# Patient Record
Sex: Male | Born: 1950
Health system: Southern US, Community
[De-identification: ages and names within clinical notes are randomized; demographics above are authoritative.]

## PROBLEM LIST (undated history)

## (undated) DIAGNOSIS — Z720 Tobacco use: Secondary | ICD-10-CM

## (undated) DIAGNOSIS — I2510 Atherosclerotic heart disease of native coronary artery without angina pectoris: Secondary | ICD-10-CM

## (undated) DIAGNOSIS — F419 Anxiety disorder, unspecified: Secondary | ICD-10-CM

## (undated) DIAGNOSIS — R55 Syncope and collapse: Secondary | ICD-10-CM

## (undated) DIAGNOSIS — F319 Bipolar disorder, unspecified: Secondary | ICD-10-CM

## (undated) DIAGNOSIS — J449 Chronic obstructive pulmonary disease, unspecified: Secondary | ICD-10-CM

## (undated) DIAGNOSIS — R911 Solitary pulmonary nodule: Secondary | ICD-10-CM

## (undated) DIAGNOSIS — Z87442 Personal history of urinary calculi: Secondary | ICD-10-CM

## (undated) DIAGNOSIS — K219 Gastro-esophageal reflux disease without esophagitis: Secondary | ICD-10-CM

## (undated) DIAGNOSIS — I10 Essential (primary) hypertension: Secondary | ICD-10-CM

## (undated) DIAGNOSIS — B9789 Other viral agents as the cause of diseases classified elsewhere: Secondary | ICD-10-CM

## (undated) DIAGNOSIS — J069 Acute upper respiratory infection, unspecified: Secondary | ICD-10-CM

## (undated) DIAGNOSIS — M1990 Unspecified osteoarthritis, unspecified site: Secondary | ICD-10-CM

## (undated) DIAGNOSIS — M722 Plantar fascial fibromatosis: Secondary | ICD-10-CM

## (undated) DIAGNOSIS — H409 Unspecified glaucoma: Secondary | ICD-10-CM

## (undated) DIAGNOSIS — R7303 Prediabetes: Secondary | ICD-10-CM

## (undated) DIAGNOSIS — E785 Hyperlipidemia, unspecified: Secondary | ICD-10-CM

## (undated) DIAGNOSIS — I714 Abdominal aortic aneurysm, without rupture, unspecified: Secondary | ICD-10-CM

## (undated) DIAGNOSIS — I219 Acute myocardial infarction, unspecified: Secondary | ICD-10-CM

## (undated) DIAGNOSIS — F32A Depression, unspecified: Secondary | ICD-10-CM

## (undated) DIAGNOSIS — J439 Emphysema, unspecified: Secondary | ICD-10-CM

## (undated) HISTORY — PX: COLONOSCOPY: SHX174

## (undated) HISTORY — PX: TONSILLECTOMY: SUR1361

## (undated) HISTORY — DX: Acute upper respiratory infection, unspecified: J06.9

## (undated) HISTORY — DX: Plantar fascial fibromatosis: M72.2

## (undated) HISTORY — PX: OTHER SURGICAL HISTORY: SHX169

## (undated) HISTORY — DX: Bipolar disorder, unspecified: F31.9

## (undated) HISTORY — DX: Unspecified glaucoma: H40.9

## (undated) HISTORY — DX: Gastro-esophageal reflux disease without esophagitis: K21.9

## (undated) HISTORY — DX: Essential (primary) hypertension: I10

## (undated) HISTORY — PX: PERCUTANEOUS CORONARY STENT INTERVENTION (PCI-S): SHX6016

## (undated) HISTORY — DX: Anxiety disorder, unspecified: F41.9

## (undated) HISTORY — DX: Atherosclerotic heart disease of native coronary artery without angina pectoris: I25.10

## (undated) HISTORY — DX: Unspecified osteoarthritis, unspecified site: M19.90

## (undated) HISTORY — DX: Emphysema, unspecified: J43.9

## (undated) HISTORY — PX: UPPER GASTROINTESTINAL ENDOSCOPY: SHX188

## (undated) HISTORY — DX: Other viral agents as the cause of diseases classified elsewhere: B97.89

## (undated) HISTORY — DX: Hyperlipidemia, unspecified: E78.5

## (undated) HISTORY — DX: Syncope and collapse: R55

## (undated) HISTORY — DX: Solitary pulmonary nodule: R91.1

## (undated) HISTORY — DX: Chronic obstructive pulmonary disease, unspecified: J44.9

## (undated) HISTORY — DX: Tobacco use: Z72.0

## (undated) HISTORY — DX: Prediabetes: R73.03

## (undated) HISTORY — DX: Acute myocardial infarction, unspecified: I21.9

---

## 2000-02-15 ENCOUNTER — Emergency Department (HOSPITAL_COMMUNITY): Admission: EM | Admit: 2000-02-15 | Discharge: 2000-02-16 | Payer: Self-pay | Admitting: Emergency Medicine

## 2002-12-05 ENCOUNTER — Encounter: Payer: Self-pay | Admitting: Family Medicine

## 2002-12-05 ENCOUNTER — Encounter: Admission: RE | Admit: 2002-12-05 | Discharge: 2002-12-05 | Payer: Self-pay | Admitting: Family Medicine

## 2002-12-06 ENCOUNTER — Encounter: Admission: RE | Admit: 2002-12-06 | Discharge: 2002-12-06 | Payer: Self-pay | Admitting: Family Medicine

## 2002-12-06 ENCOUNTER — Encounter: Payer: Self-pay | Admitting: Family Medicine

## 2003-06-28 ENCOUNTER — Encounter: Payer: Self-pay | Admitting: Emergency Medicine

## 2003-06-29 ENCOUNTER — Inpatient Hospital Stay (HOSPITAL_COMMUNITY): Admission: EM | Admit: 2003-06-29 | Discharge: 2003-06-30 | Payer: Self-pay | Admitting: Emergency Medicine

## 2004-08-20 ENCOUNTER — Ambulatory Visit: Payer: Self-pay | Admitting: Family Medicine

## 2004-08-25 ENCOUNTER — Ambulatory Visit: Payer: Self-pay | Admitting: Family Medicine

## 2004-10-16 ENCOUNTER — Ambulatory Visit: Payer: Self-pay | Admitting: Family Medicine

## 2005-09-08 ENCOUNTER — Ambulatory Visit: Payer: Self-pay | Admitting: Family Medicine

## 2005-09-17 ENCOUNTER — Ambulatory Visit: Payer: Self-pay | Admitting: Family Medicine

## 2009-06-24 ENCOUNTER — Inpatient Hospital Stay (HOSPITAL_COMMUNITY): Admission: EM | Admit: 2009-06-24 | Discharge: 2009-06-27 | Payer: Self-pay | Admitting: Emergency Medicine

## 2009-06-24 ENCOUNTER — Ambulatory Visit: Payer: Self-pay | Admitting: Cardiology

## 2009-06-25 ENCOUNTER — Encounter: Payer: Self-pay | Admitting: Cardiology

## 2009-07-01 ENCOUNTER — Encounter: Payer: Self-pay | Admitting: Cardiology

## 2009-07-04 DIAGNOSIS — F17200 Nicotine dependence, unspecified, uncomplicated: Secondary | ICD-10-CM | POA: Insufficient documentation

## 2009-07-04 DIAGNOSIS — R55 Syncope and collapse: Secondary | ICD-10-CM | POA: Insufficient documentation

## 2009-07-04 DIAGNOSIS — Z720 Tobacco use: Secondary | ICD-10-CM | POA: Insufficient documentation

## 2009-07-04 DIAGNOSIS — F1010 Alcohol abuse, uncomplicated: Secondary | ICD-10-CM | POA: Insufficient documentation

## 2009-07-07 ENCOUNTER — Ambulatory Visit: Payer: Self-pay | Admitting: Cardiology

## 2009-07-07 ENCOUNTER — Encounter: Payer: Self-pay | Admitting: Physician Assistant

## 2009-07-07 DIAGNOSIS — E785 Hyperlipidemia, unspecified: Secondary | ICD-10-CM | POA: Insufficient documentation

## 2009-07-07 DIAGNOSIS — I2510 Atherosclerotic heart disease of native coronary artery without angina pectoris: Secondary | ICD-10-CM | POA: Insufficient documentation

## 2009-07-07 DIAGNOSIS — F319 Bipolar disorder, unspecified: Secondary | ICD-10-CM | POA: Insufficient documentation

## 2009-07-09 ENCOUNTER — Encounter: Payer: Self-pay | Admitting: Cardiology

## 2009-07-09 ENCOUNTER — Telehealth: Payer: Self-pay | Admitting: Cardiology

## 2009-07-14 ENCOUNTER — Encounter: Payer: Self-pay | Admitting: Cardiology

## 2009-07-15 ENCOUNTER — Encounter: Payer: Self-pay | Admitting: Cardiology

## 2009-07-15 ENCOUNTER — Telehealth: Payer: Self-pay | Admitting: Cardiology

## 2009-07-24 ENCOUNTER — Telehealth: Payer: Self-pay | Admitting: Cardiology

## 2009-07-24 ENCOUNTER — Encounter: Payer: Self-pay | Admitting: Cardiology

## 2009-08-07 ENCOUNTER — Ambulatory Visit: Payer: Self-pay | Admitting: Cardiology

## 2009-08-07 DIAGNOSIS — Z9861 Coronary angioplasty status: Secondary | ICD-10-CM | POA: Insufficient documentation

## 2009-08-08 ENCOUNTER — Encounter: Payer: Self-pay | Admitting: Cardiology

## 2009-08-14 LAB — CONVERTED CEMR LAB
ALT: 28 units/L (ref 0–53)
AST: 30 units/L (ref 0–37)
Albumin: 4 g/dL (ref 3.5–5.2)
Alkaline Phosphatase: 102 units/L (ref 39–117)
Bilirubin, Direct: 0 mg/dL (ref 0.0–0.3)
Cholesterol: 193 mg/dL (ref 0–200)
HDL: 55 mg/dL (ref >39.00)
LDL Cholesterol: 119 mg/dL — ABNORMAL HIGH (ref 0–99)
Total Bilirubin: 0.7 mg/dL (ref 0.3–1.2)
Total CHOL/HDL Ratio: 4
Total Protein: 6.5 g/dL (ref 6.0–8.3)
Triglycerides: 94 mg/dL (ref 0.0–149.0)
VLDL: 18.8 mg/dL (ref 0.0–40.0)

## 2009-09-08 ENCOUNTER — Ambulatory Visit: Payer: Self-pay | Admitting: Cardiology

## 2009-09-09 ENCOUNTER — Ambulatory Visit: Payer: Self-pay | Admitting: Cardiology

## 2009-09-09 LAB — CONVERTED CEMR LAB
ALT: 35 units/L (ref 0–53)
AST: 28 units/L (ref 0–37)
Albumin: 3.8 g/dL (ref 3.5–5.2)
Cholesterol: 148 mg/dL (ref 0–200)
HDL: 63 mg/dL (ref >39.00)
Total Protein: 6.3 g/dL (ref 6.0–8.3)
Triglycerides: 104 mg/dL (ref 0.0–149.0)
VLDL: 20.8 mg/dL (ref 0.0–40.0)

## 2009-09-23 ENCOUNTER — Telehealth (INDEPENDENT_AMBULATORY_CARE_PROVIDER_SITE_OTHER): Payer: Self-pay | Admitting: *Deleted

## 2009-10-23 ENCOUNTER — Ambulatory Visit: Payer: Self-pay | Admitting: Cardiology

## 2009-11-20 DIAGNOSIS — I219 Acute myocardial infarction, unspecified: Secondary | ICD-10-CM | POA: Insufficient documentation

## 2009-11-26 ENCOUNTER — Ambulatory Visit: Payer: Self-pay | Admitting: Cardiology

## 2009-12-24 ENCOUNTER — Telehealth: Payer: Self-pay | Admitting: Cardiology

## 2010-01-02 ENCOUNTER — Telehealth (INDEPENDENT_AMBULATORY_CARE_PROVIDER_SITE_OTHER): Payer: Self-pay | Admitting: *Deleted

## 2010-01-30 ENCOUNTER — Ambulatory Visit: Payer: Self-pay | Admitting: Cardiology

## 2010-02-10 ENCOUNTER — Ambulatory Visit (HOSPITAL_COMMUNITY): Admission: RE | Admit: 2010-02-10 | Discharge: 2010-02-10 | Payer: Self-pay | Admitting: Cardiology

## 2010-02-10 ENCOUNTER — Ambulatory Visit: Payer: Self-pay | Admitting: Cardiology

## 2010-04-07 ENCOUNTER — Ambulatory Visit: Payer: Self-pay | Admitting: Cardiology

## 2010-04-22 ENCOUNTER — Telehealth (INDEPENDENT_AMBULATORY_CARE_PROVIDER_SITE_OTHER): Payer: Self-pay | Admitting: *Deleted

## 2010-06-08 ENCOUNTER — Telehealth: Payer: Self-pay | Admitting: Cardiology

## 2010-07-28 ENCOUNTER — Ambulatory Visit: Payer: Self-pay | Admitting: Cardiology

## 2010-07-28 DIAGNOSIS — J984 Other disorders of lung: Secondary | ICD-10-CM | POA: Insufficient documentation

## 2010-07-28 DIAGNOSIS — Z9189 Other specified personal risk factors, not elsewhere classified: Secondary | ICD-10-CM | POA: Insufficient documentation

## 2010-07-30 ENCOUNTER — Encounter: Payer: Self-pay | Admitting: Cardiology

## 2010-07-31 ENCOUNTER — Ambulatory Visit: Payer: Self-pay | Admitting: Family Medicine

## 2010-07-31 DIAGNOSIS — L309 Dermatitis, unspecified: Secondary | ICD-10-CM | POA: Insufficient documentation

## 2010-07-31 DIAGNOSIS — D239 Other benign neoplasm of skin, unspecified: Secondary | ICD-10-CM | POA: Insufficient documentation

## 2010-08-03 ENCOUNTER — Telehealth: Payer: Self-pay | Admitting: Family Medicine

## 2010-08-10 ENCOUNTER — Telehealth: Payer: Self-pay | Admitting: Cardiology

## 2010-08-17 ENCOUNTER — Ambulatory Visit: Payer: Self-pay | Admitting: Cardiology

## 2010-08-19 LAB — CONVERTED CEMR LAB
ALT: 39 units/L (ref 0–53)
Albumin: 3.8 g/dL (ref 3.5–5.2)
Alkaline Phosphatase: 88 units/L (ref 39–117)
Bilirubin, Direct: 0.2 mg/dL (ref 0.0–0.3)
Total Protein: 6.4 g/dL (ref 6.0–8.3)

## 2010-11-15 LAB — CONVERTED CEMR LAB
Alkaline Phosphatase: 96 units/L (ref 39–117)
Bilirubin, Direct: 0.2 mg/dL (ref 0.0–0.3)
Cholesterol: 157 mg/dL (ref 0–200)
LDL Cholesterol: 69 mg/dL (ref 0–99)
Total Bilirubin: 1 mg/dL (ref 0.3–1.2)
Total Protein: 6.9 g/dL (ref 6.0–8.3)
VLDL: 17.6 mg/dL (ref 0.0–40.0)

## 2010-11-19 NOTE — Progress Notes (Signed)
Summary: pt needs refill called into [facility]  Phone Note Refill Request Call back at [facility] Phone [phone] Message from:  Patient on [facility]  Refills Requested: Medication #1:  PLAVIX 75 MG TABS Take one tablet by mouth daily Initial call taken by: Omer Jack,  April 22, 2010 11:28 AM  Follow-up for Phone Call        medication wiil arrive between the next 7-10 business days. patient notified Follow-up by: Vikki Ports,  April 22, 2010 11:54 AM     Appended Document: pt needs refill called into [facility] Plavix arrived in the office.  I left a message for the pt to come by and pick-up medication.  Order #[id number], Customer [phone], Customer PO  [phone]

## 2010-11-19 NOTE — Assessment & Plan Note (Signed)
Summary: PSORIASIS AND SPOT ON FACE//SLM   Vital Signs:  Patient profile:   60 year old male Weight:      153 pounds Temp:     98.5 degrees F oral BP sitting:   174 / 86  Vitals Entered By: Lynann Beaver CMA (July 31, 2010 9:59 AM) CC: lesions of face and lgs Is Patient Diabetic? No Pain Assessment Patient in pain? no        Primary Care Provider:  Governor Specking  CC:  lesions of face and lgs.  History of Present Illness: Jeffrey Hudson is a 60 year old male, smoker of history of bipolar disease, alcohol abuse, coronary disease, who comes in today for evaluation of a facial lesion, and eczema.  He wants a flu shot and to discuss quit smoking.  He said a lesion in left side of his face at the edge in the nose for over 12 months.  It's increasing in size.  He is eczema on his right lower extremity and has been using his wife's medication.  He would like a flu shot today.  He reeks of tobacco.  I outlined a smoking cessation program, which include chantix, but we will drastically cut back the dose to a half a milligram daily because of his underlying mental health problems.  Current Medications (verified): 1)  Plavix 75 Mg Tabs (Clopidogrel Bisulfate) .... Take One Tablet By Mouth Daily 2)  Aspirin 81 Mg Tbec (Aspirin) .... Take One Tablet By Mouth Daily 3)  Prozac 20 Mg Caps (Fluoxetine Hcl) .... Take 1 Capsule By Mouth Once A Day 4)  Zyprexa 5 Mg Tabs (Olanzapine) .... Take 1 Tablet By Mouth Once A Day 5)  Multivitamins  Tabs (Multiple Vitamin) .... Take 1 Tablet By Mouth Once A Day 6)  Crestor 40 Mg Tabs (Rosuvastatin Calcium) .... Take One Tablet By Mouth Daily. 7)  Nitrostat 0.4 Mg Subl (Nitroglycerin) .Marland Kitchen.. 1 Tablet Under Tongue At Onset of Chest Pain; You May Repeat Every 5 Minutes For Up To 3 Doses.  Allergies (verified): No Known Drug Allergies  Past History:  Past medical, surgical, family and social histories (including risk factors) reviewed for relevance to current  acute and chronic problems.  Past Medical History: Reviewed history from 11/20/2009 and no changes required.  A 6-mm smoothly rounded nodule in the left lower lobe, seen on CT  of the chest in 2004, likely benign, but followup CT recommended,  not performed.   PCI of diagonal with non DES stent.  Current Problems:  CAD, NATIVE VESSEL (ICD-414.01) AMI (ICD-410.90)-Acute ST. elevation  HYPERLIPIDEMIA-MIXED (ICD-272.4) SYNCOPE (ICD-780.2)- syncope in 2001 and 2004 with no clear cause.  TOBACCO ABUSE (ICD-305.1) BIPOLAR DISORDER UNSPECIFIED (ICD-296.80)     Past Surgical History: Reviewed history from 07/04/2009 and no changes required.  None.   Family History: Reviewed history from 07/04/2009 and no changes required.  His mother died in her mid 46s with no history of heart   disease and his father died in his 71s with a history of EtOH abuse, but   no coronary artery disease and no siblings have coronary artery disease.      Social History: Reviewed history from 07/07/2009 and no changes required. He lives in [place] with his wife.  He is currently   unemployed, in [employer].  He admits to heavy tobacco use with greater than   one pack per day and heavy EtOH use with 8-10 beers daily.  He smokes marijuana but says he quit 06/2009.  He does  have a history of THC use.      Review of Systems      See HPI  Physical Exam  General:  Well-developed,well-nourished,in no acute distress; alert,appropriate and cooperative throughout examination Skin:  there is a 8mm irregular lesion in the left side of the face, just at the margin.  The nose with a pit in the middle consistent a possible skin cancer.  He also has eczema on his right lower extremity.   Problems:  Medical Problems Added: 1)  Dx of Nevus, Atypical  (ICD-216.9) 2)  Dx of Eczema, Atopic  (ICD-691.8)  Impression & Recommendations:  Problem # 1:  ECZEMA, ATOPIC (ICD-691.8) Assessment Unchanged  His updated medication  list for this problem includes:    Triamcinolone Acetonide 0.025 % Oint (Triamcinolone acetonide) .Marland Kitchen... Apply  two times a day  Problem # 2:  NEVUS, ATYPICAL (ICD-216.9) Assessment: New  Problem # 3:  TOBACCO ABUSE (ICD-305.1) Assessment: Unchanged  His updated medication list for this problem includes:    Chantix Continuing Month Pak 1 Mg Tabs (Varenicline tartrate) ..... Uad  Orders: Tobacco use cessation intermediate 3-10 minutes (99406)  Complete Medication List: 1)  Plavix 75 Mg Tabs (Clopidogrel bisulfate) .... Take one tablet by mouth daily 2)  Aspirin 81 Mg Tbec (Aspirin) .... Take one tablet by mouth daily 3)  Prozac 20 Mg Caps (Fluoxetine hcl) .... Take 1 capsule by mouth once a day 4)  Zyprexa 5 Mg Tabs (Olanzapine) .... Take 1 tablet by mouth once a day 5)  Multivitamins Tabs (Multiple vitamin) .... Take 1 tablet by mouth once a day 6)  Crestor 40 Mg Tabs (Rosuvastatin calcium) .... Take one tablet by mouth daily. 7)  Nitrostat 0.4 Mg Subl (Nitroglycerin) .Marland Kitchen.. 1 tablet under tongue at onset of chest pain; you may repeat every 5 minutes for up to 3 doses. 8)  Chantix Continuing Month Pak 1 Mg Tabs (Varenicline tartrate) .... Uad 9)  Triamcinolone Acetonide 0.025 % Oint (Triamcinolone acetonide) .... Apply  two times a day  Patient Instructions: 1)  called [facility]  dermatology to make an appointment to be seen about the mole.  2)  Begin chantix one half tablet every morning.  Return in 4 weeks for follow-up. 3)  Use a combination of udder cream and triamcinolone on the eczema Prescriptions: TRIAMCINOLONE ACETONIDE 0.025 % OINT (TRIAMCINOLONE ACETONIDE) apply  two times a day  #60 gr x 3   Entered and Authorized by:   Roderick Pee MD   Signed by:   Roderick Pee MD on 07/31/2010   Method used:   Print then Give to Patient   RxID:   [id number] CHANTIX CONTINUING MONTH PAK 1 MG TABS (VARENICLINE TARTRATE) UAD  #1 x 2   Entered and Authorized by:   Roderick Pee MD   Signed by:   Roderick Pee MD on 07/31/2010   Method used:   Print then Give to Patient   RxID:   [id number] TRIAMCINOLONE ACETONIDE 0.025 % OINT (TRIAMCINOLONE ACETONIDE) apply  two times a day  #60 gr x 3   Entered and Authorized by:   Roderick Pee MD   Signed by:   Roderick Pee MD on 07/31/2010   Method used:   Electronically to        [facility] [phone][facility] (retail)       [address]       [place], [place]  [address]  Ph: [phone] or [phone]       Fax: [phone]   RxID:   [id number] CHANTIX CONTINUING MONTH PAK 1 MG TABS (VARENICLINE TARTRATE) UAD  #1 x 2   Entered and Authorized by:   Roderick Pee MD   Signed by:   Roderick Pee MD on 07/31/2010   Method used:   Electronically to        [facility] [phone]* (retail)       [address]       [place], [place]  [address]       Ph: [phone] or [phone]       Fax: [phone]   RxID:   [id number]

## 2010-11-19 NOTE — Progress Notes (Signed)
Summary: DENTAL SURGERY   Phone Note Call from Patient Call back at Home Phone [phone]   Caller: Patient Summary of Call: PT HAVING DENTAL SURGERY WANT TO KNOW IF HE NEED TO STOP ANY OF HIS MEDICATIONS Initial call taken by: Judie Grieve,  December 24, 2009 11:22 AM  Follow-up for Phone Call        I spoke with the pt and he has not seen an oral surgeon at this time.  The pt said he needs to have a tooth removed so that he can get dentures. I asked the pt to see the oral surgeon and then they would advise him if he needs to hold Plavix and ASA for extraction.  I also instructed the pt that he will need to call our office to discuss clearance for holding these medications if needed.  Pt agrees to call us after he sees the oral surgeon if he needs to hold these meds. Follow-up by: Julieta Gutting, RN, BSN,  December 24, 2009 12:26 PM

## 2010-11-19 NOTE — Assessment & Plan Note (Signed)
Summary: f24m   Visit Type:  4 months follow up Primary Provider:  Governor Specking  CC:  No cardiac complaints.  History of Present Illness: Patient is ok.  He does not any specific issues. He has used no NTG recently.  Patient is concerned about the fact that he is unemployed and uninsured.  He is walking without discomfort.  Still having trouble with smoking, but he is about 50% of what he was.  He says he has too much time on his hands.  He knows he needs to quite.    Current Medications (verified): 1)  Plavix 75 Mg Tabs (Clopidogrel Bisulfate) .... Take One Tablet By Mouth Daily 2)  Aspirin 81 Mg Tbec (Aspirin) .... Take One Tablet By Mouth Daily 3)  Prozac 20 Mg Caps (Fluoxetine Hcl) .... Take 1 Capsule By Mouth Once A Day 4)  Zyprexa 5 Mg Tabs (Olanzapine) .... Take 1 Tablet By Mouth Once A Day 5)  Multivitamins  Tabs (Multiple Vitamin) .... Take 1 Tablet By Mouth Once A Day 6)  Crestor 40 Mg Tabs (Rosuvastatin Calcium) .... Take One Tablet By Mouth Daily. 7)  Nitrostat 0.4 Mg Subl (Nitroglycerin) .Marland Kitchen.. 1 Tablet Under Tongue At Onset of Chest Pain; You May Repeat Every 5 Minutes For Up To 3 Doses.  Allergies (verified): No Known Drug Allergies  Past History:  Past Medical History: Last updated: 11/20/2009  A 6-mm smoothly rounded nodule in the left lower lobe, seen on CT  of the chest in 2004, likely benign, but followup CT recommended,  not performed.   PCI of diagonal with non DES stent.  Current Problems:  CAD, NATIVE VESSEL (ICD-414.01) AMI (ICD-410.90)-Acute ST. elevation  HYPERLIPIDEMIA-MIXED (ICD-272.4) SYNCOPE (ICD-780.2)- syncope in 2001 and 2004 with no clear cause.  TOBACCO ABUSE (ICD-305.1) BIPOLAR DISORDER UNSPECIFIED (ICD-296.80)     Past Surgical History: Last updated: 07-09-2009  None.   Family History: Last updated: July 09, 2009  His mother died in her mid 53s with no history of heart   disease and his father died in his 8s with a history of EtOH abuse,  but   no coronary artery disease and no siblings have coronary artery disease.      Social History: Last updated: 07/07/2009 He lives in [place] with his wife.  He is currently   unemployed, in [employer].  He admits to heavy tobacco use with greater than   one pack per day and heavy EtOH use with 8-10 beers daily.  He smokes marijuana but says he quit 06/2009.  He does have a history of THC use.      Vital Signs:  Patient profile:   60 year old male Height:      66 inches Weight:      152.50 pounds BMI:     24.70 Pulse rate:   71 / minute Pulse rhythm:   regular Resp:     18 per minute BP sitting:   130 / 72  (left arm) Cuff size:   large  Vitals Entered By: Vikki Ports (July 28, 2010 10:17 AM)  Physical Exam  General:  Well developed, well nourished, in no acute distress. Head:  normocephalic and atraumatic Eyes:  PERRLA/EOM intact; conjunctiva and lids normal. Lungs:  Clear bilaterally to auscultation and percussion. Extremities:  No clubbing or cyanosis. Neurologic:  Alert and oriented x 3.   CXR  Procedure date:  06/25/2009  Findings:      CHEST - 2 VIEW    Comparison: None  Findings: The heart size and mediastinal contours are within normal   limits.  Both lungs are clear.  The visualized skeletal structures   are unremarkable.    IMPRESSION:   No acute findings.    Read By:  Rosealee Albee,  M.D.   Released By:  Rosealee Albee,  M.D.  EKG  Procedure date:  07/28/2010  Findings:      NSR.  LAD      Impression & Recommendations:  Problem # 1:  CAD, NATIVE VESSEL (ICD-414.01) Patient has no current symptoms.  He is doing relatively well at present.  We will continue to followup with patient at six months. His updated medication list for this problem includes:    Plavix 75 Mg Tabs (Clopidogrel bisulfate) .Marland Kitchen... Take one tablet by mouth daily    Aspirin 81 Mg Tbec (Aspirin) .Marland Kitchen... Take one tablet by mouth daily    Nitrostat 0.4 Mg Subl  (Nitroglycerin) .Marland Kitchen... 1 tablet under tongue at onset of chest pain; you may repeat every 5 minutes for up to 3 doses.  His updated medication list for this problem includes:    Plavix 75 Mg Tabs (Clopidogrel bisulfate) .Marland Kitchen... Take one tablet by mouth daily    Aspirin 81 Mg Tbec (Aspirin) .Marland Kitchen... Take one tablet by mouth daily    Nitrostat 0.4 Mg Subl (Nitroglycerin) .Marland Kitchen... 1 tablet under tongue at onset of chest pain; you may repeat every 5 minutes for up to 3 doses.  Orders: EKG w/ Interpretation (93000) TLB-Lipid Panel (80061-LIPID) TLB-Hepatic/Liver Function Pnl (80076-HEPATIC)  Problem # 2:  HYPERLIPIDEMIA-MIXED (ICD-272.4)  Has not had lab in some time.  His updated medication list for this problem includes:    Crestor 40 Mg Tabs (Rosuvastatin calcium) .Marland Kitchen... Take one tablet by mouth daily.  Orders: EKG w/ Interpretation (93000) TLB-Lipid Panel (80061-LIPID) TLB-Hepatic/Liver Function Pnl (80076-HEPATIC)  His updated medication list for this problem includes:    Crestor 40 Mg Tabs (Rosuvastatin calcium) .Marland Kitchen... Take one tablet by mouth daily.  Problem # 3:  TOBACCO ABUSE (ICD-305.1) Continues to smoke. Counseling given.   Problem # 4:  LUNG NODULE (ICD-518.89) See note of CT of 2004 done by Dr. Tawanna Cooler.  .  Had follow up CXR 2010.  No obvious nodule was seen.  Patient Instructions: 1)  Your physician recommends that you have a FASTING Lipid and Liver Profile today. 2)  Your physician recommends that you continue on your current medications as directed. Please refer to the Current Medication list given to you today. 3)  Your physician wants you to follow-up in:  6 MONTHS.  You will receive a reminder letter in the mail two months in advance. If you don't receive a letter, please call our office to schedule the follow-up appointment. 4)  Plavix assistance form given to the pt for completion.

## 2010-11-19 NOTE — Miscellaneous (Signed)
Summary: [facility] Progress Note  [facility] Progress Note   Imported By: Roderic Ovens 01/21/2010 14:35:31  _____________________________________________________________________  External Attachment:    Type:   Image     Comment:   External Document

## 2010-11-19 NOTE — Progress Notes (Signed)
Summary: Plavix assistance  Phone Note Outgoing Call   Call placed by: Julieta Gutting, RN, BSN,  August 10, 2010 3:09 PM Call placed to: Patient Summary of Call: Left message for pt that Plavix has arrived.  Plavix placed at the front desk for pick-up.  Customer PO  number [id number], Order #[id number] Initial call taken by: Julieta Gutting, RN, BSN,  August 10, 2010 3:09 PM

## 2010-11-19 NOTE — Progress Notes (Signed)
Summary: need refill crestor- pt assitance   Phone Note Call from Patient Call back at Home Phone [phone] Message from:  Patient on June 08, 2010 9:29 AM  Refills Requested: Medication #1:  CRESTOR 40 MG TABS Take one tablet by mouth daily. Caller: Patient Reason for Call: Talk to Nurse Summary of Call: Per pt calling on patient assistance with drug company. pt also dr. Tedra Sen[place] need license # .  Initial call taken by: Lorne Skeens,  June 08, 2010 9:29 AM  Follow-up for Phone Call        I spoke with the pt and he needs a new Rx to send into [facility] for Crestor.  The other information needed was provided to the pt.  Follow-up by: Julieta Gutting, RN, BSN,  June 08, 2010 12:44 PM    Prescriptions: CRESTOR 40 MG TABS (ROSUVASTATIN CALCIUM) Take one tablet by mouth daily.  #90 x 3   Entered by:   Julieta Gutting, RN, BSN   Authorized by:   Ronaldo Miyamoto, MD, [facility]   Signed by:   Julieta Gutting, RN, BSN on 06/08/2010   Method used:   Print then Mail to Patient   RxID:   [id number]

## 2010-11-19 NOTE — Progress Notes (Signed)
Summary: Pt need his Plavix ordered  Phone Note Call from Patient Call back at Home Phone [phone]   Caller: Patient Summary of Call: Pt calling to get his Plavix ordered, said the office orders this medication. Initial call taken by: Judie Grieve,  January 02, 2010 9:55 AM  Follow-up for Phone Call        Medication will arrive at our office between the next 7 to 10 days.  Follow-up by: Vikki Ports,  January 02, 2010 2:16 PM     Appended Document: Pt need his Plavix ordered Plavix arrived in the office and placed at the front desk for pick-up.  order # [id number], Customer PO # [id number].  Left message on the pt's phone in regards to plavix.

## 2010-11-19 NOTE — Progress Notes (Signed)
Summary: rx change  Phone Note From Pharmacy Call back at [phone]   Caller: [facility]    voice mail Summary of Call: received rx for triamcinolone 0.025% ointment. pt has requested the cream instead. it only costs $4.00. Initial call taken by: Warnell Forester,  August 03, 2010 10:07 AM    New/Updated Medications: TRIAMCINOLONE ACETONIDE 0.025 % CREA (TRIAMCINOLONE ACETONIDE) apply two times a day Prescriptions: TRIAMCINOLONE ACETONIDE 0.025 % CREA (TRIAMCINOLONE ACETONIDE) apply two times a day  #2 x 6   Entered by:   Kern Reap CMA (AAMA)   Authorized by:   Roderick Pee MD   Signed by:   Kern Reap CMA (AAMA) on 08/03/2010   Method used:   [facility]        [facility] [phone][facility] (retail)       [address]       [place], [place]  [address]       Ph: [phone] or [phone]       Fax: [phone]   RxID:   [id number]

## 2010-11-19 NOTE — Assessment & Plan Note (Signed)
Summary: eph   Visit Type:  2  mo f/u Primary Provider:  Governor Specking  CC:  pt states he has had some very very slight cp states he feels good though.....  History of Present Illness: Doing well at this point.  Has had some very slight discomfort, so feeling good as a whole.  Down to a half a pack per day.  He understands that he needs to quit.  Trying all he can.  Cognitive of everything he is doing.    Current Medications (verified): 1)  Plavix 75 Mg Tabs (Clopidogrel Bisulfate) .... Take One Tablet By Mouth Daily 2)  Aspirin 81 Mg Tbec (Aspirin) .... Take One Tablet By Mouth Daily 3)  Prozac 20 Mg Caps (Fluoxetine Hcl) .... Take 1 Capsule By Mouth Once A Day 4)  Zyprexa 5 Mg Tabs (Olanzapine) .... Take 1 Tablet By Mouth Once A Day 5)  Multivitamins  Tabs (Multiple Vitamin) .... Take 1 Tablet By Mouth Once A Day 6)  Crestor 40 Mg Tabs (Rosuvastatin Calcium) .... Take One Tablet By Mouth Daily. 7)  Nitrostat 0.4 Mg Subl (Nitroglycerin) .Marland Kitchen.. 1 Tablet Under Tongue At Onset of Chest Pain; You May Repeat Every 5 Minutes For Up To 3 Doses.  Allergies (verified): No Known Drug Allergies  Vital Signs:  Patient profile:   60 year old male Height:      66 inches Weight:      148 pounds BMI:     23.97 Pulse rate:   62 / minute Pulse rhythm:   regular BP sitting:   134 / 80  (left arm) Cuff size:   large  Vitals Entered By: Danielle Rankin, CMA (April 07, 2010 10:41 AM)  Physical Exam  General:  Well developed, well nourished, well hydrated, in no acute distress.  Head:  Normocephalic and atraumatic.  Eyes:  Conjunctiva and sclera clear without injection or icterus.  Lungs:  Clear to auscultation bilaterally. Normal respiratory effort.  Heart:  PMI non displaced.  Normal S1 and S2.  S4 gallop.  No murmur. Abdomen:  Soft, non-tender, non-distended, free of hepatosplenomegaly or masses.  Pulses:  Pulses normal in all 4 extremities.  Extremities:  No clubbing, cyanosis, or edema.    Neurologic:  Alert and oriented x 3. Normal gait without ataxia.    Cardiac Cath  Procedure date:  02/11/2010  Findings:      CONCLUSION:   1. Preserved left ventricular function.   2. Scattered disease as noted with mild restenosis of a very small-       caliber stent in the diagonal at a bifurcation point.  There was       excellent TIMI 3 flow in this area.  We would strongly recommend       discontinuation of       smoking for the patient.  I am not inclined to dilate the stent       again as it is again noted to be very small in caliber.  The       healing is appropriate, and the side branch comes out in the       midportion of the stent.  Continued medical therapy is warranted.      Impression & Recommendations:  Problem # 1:  CAD, NATIVE VESSEL (ICD-414.01) Long discussion again today. Encourage dc of smoking. Continued medical therapy.  Mechanism of ACS reviewed.  His updated medication list for this problem includes:    Plavix 75 Mg Tabs (Clopidogrel bisulfate) .Marland KitchenMarland KitchenMarland KitchenMarland Kitchen  Take one tablet by mouth daily    Aspirin 81 Mg Tbec (Aspirin) .Marland Kitchen... Take one tablet by mouth daily    Nitrostat 0.4 Mg Subl (Nitroglycerin) .Marland Kitchen... 1 tablet under tongue at onset of chest pain; you may repeat every 5 minutes for up to 3 doses.  Problem # 2:  HYPERLIPIDEMIA-MIXED (ICD-272.4) on treatment.  At [facility] in November.  His updated medication list for this problem includes:    Crestor 40 Mg Tabs (Rosuvastatin calcium) .Marland Kitchen... Take one tablet by mouth daily.  Patient Instructions: 1)  Your physician recommends that you continue on your current medications as directed. Please refer to the Current Medication list given to you today. 2)  Your physician wants you to follow-up in:   4 MONTHS. You will receive a reminder letter in the mail two months in advance. If you don't receive a letter, please call our office to schedule the follow-up appointment. Prescriptions: NITROSTAT 0.4 MG SUBL (NITROGLYCERIN)  1 tablet under tongue at onset of chest pain; you may repeat every 5 minutes for up to 3 doses.  #25 x 9   Entered by:   Danielle Rankin, CMA   Authorized by:   Ronaldo Miyamoto, MD, [facility]   Signed by:   Danielle Rankin, CMA on 04/07/2010   Method used:   Electronically to        [facility] [phone]* (retail)       [address]       [place], [place]  [address]       Ph: [phone] or [phone]       Fax: [phone]   RxID:   [id number]

## 2010-11-19 NOTE — Assessment & Plan Note (Signed)
Summary: ROV   Visit Type:  follow up Primary Provider:  Governor Specking  CC:  Pt. still having chest pains.  History of Present Illness: Still having some discomfort, a sharp pain, and sometimes pressure.  It is not definitely worse when he exerts himself.  Occurs at rest, and can last indefinitely.  It is not preventing sleeping, and he would describe as a 1 or 2.  This has been going on for quite some time.  However, it is pretty much ongoing.  It has not gotten better, nor has it gotten worse.  It is not quite similar to his MI.  The character of the pain is mild in comparison, but the pain is similar.  Patient is still smoking.    Current Medications (verified): 1)  Plavix 75 Mg Tabs (Clopidogrel Bisulfate) .... Take One Tablet By Mouth Daily 2)  Aspirin Ec 325 Mg Tbec (Aspirin) .... Take One Tablet By Mouth Daily 3)  Prozac 20 Mg Caps (Fluoxetine Hcl) .... Take 1 Capsule By Mouth Once A Day 4)  Zyprexa 5 Mg Tabs (Olanzapine) .... Take 1 Tablet By Mouth Once A Day 5)  Multivitamins  Tabs (Multiple Vitamin) .... Take 1 Tablet By Mouth Once A Day 6)  Crestor 40 Mg Tabs (Rosuvastatin Calcium) .... Take One Tablet By Mouth Daily.  Allergies (verified): No Known Drug Allergies  Past History:  Past Medical History: Last updated: 11/20/2009  A 6-mm smoothly rounded nodule in the left lower lobe, seen on CT  of the chest in 2004, likely benign, but followup CT recommended,  not performed.   PCI of diagonal with non DES stent.  Current Problems:  CAD, NATIVE VESSEL (ICD-414.01) AMI (ICD-410.90)-Acute ST. elevation  HYPERLIPIDEMIA-MIXED (ICD-272.4) SYNCOPE (ICD-780.2)- syncope in 2001 and 2004 with no clear cause.  TOBACCO ABUSE (ICD-305.1) BIPOLAR DISORDER UNSPECIFIED (ICD-296.80)     Past Surgical History: Last updated: 07-25-09  None.   Family History: Last updated: 25-Jul-2009  His mother died in her mid 70s with no history of heart   disease and his father died in his 3s  with a history of EtOH abuse, but   no coronary artery disease and no siblings have coronary artery disease.      Social History: Last updated: 07/07/2009 He lives in [place] with his wife.  He is currently   unemployed, in [employer].  He admits to heavy tobacco use with greater than   one pack per day and heavy EtOH use with 8-10 beers daily.  He smokes marijuana but says he quit 06/2009.  He does have a history of THC use.      Vital Signs:  Patient profile:   60 year old male Height:      66 inches Weight:      150.25 pounds BMI:     24.34 Pulse rate:   61 / minute Pulse rhythm:   regular Resp:     18 per minute BP sitting:   150 / 78  (left arm) Cuff size:   large  Vitals Entered By: Vikki Ports (January 30, 2010 9:21 AM)  Physical Exam  General:  Well developed, well nourished, in no acute distress. Head:  normocephalic and atraumatic Neck:  Neck supple, no JVD. No masses, thyromegaly or abnormal cervical nodes. Lungs:  Clear bilaterally to auscultation and percussion. Heart:  PMI non displaced.  Normal S1 and S2.  S4. Abdomen:  Bowel sounds positive; abdomen soft and non-tender without masses, organomegaly, or hernias noted. No hepatosplenomegaly.  Msk:  Back normal, normal gait. Muscle strength and tone normal. Pulses:  pulses normal in all 4 extremities Extremities:  No clubbing or cyanosis. Neurologic:  Alert and oriented x 3.   Cardiac Cath  Procedure date:  06/24/2009  Findings:      ANGIOGRAPHIC DATA: 1. The right coronary artery is moderately large vessel.  There is     mild diffuse luminal irregularity.  There is about a 20% area of     narrowing midvessel and 30% narrowing after the first inferior     branch.  The posterolateral branch has bifurcation disease with 60%     in the distal most branch and 50% in the more medial branch. 2. The left main is free of critical disease. 3. The LAD courses to the apex.  The LAD has about 30% narrowing in     the  midvessel overlying the septal perforator.  The septal     perforator itself has about 70% narrowing.  The distal vessel has     30% and 40% narrowings.  The large first diagonal which comes off     proximal to the septal, was totally occluded proximally.  After     reperfusion, there was diffuse segmental plaque which was     subsequently stented with excellent result with resolution of     stenosis to less than 10% TIMI-3 flow.  Distal to this, there was     another 50% area of narrowing and some diffuse luminal irregularity     of the large diagonal branch.  The small side branch does fill-in     by collaterals from the distal branch. 4. The circumflex is a moderate-sized vessel with several scattered     areas of luminal irregularity of about 40%, but no critical     stenoses. 5. Ventriculography in RAO projection reveals anterolateral     hypokinesis, but overall ejection fraction in the range of 45-50%.   CONCLUSIONS: 1. Acute myocardial infarction due to diagonal occlusion. 2. Successful reperfusion using a small non-drug-eluting stent due to     vessel size, and also other issues.  Cardiac Cath  Procedure date:  01/30/2010  Findings:      NSR.  WNL.    EKG  Procedure date:  01/30/2010  Findings:      NSR. WNL.  Impression & Recommendations:  Problem # 1:  CAD, NATIVE VESSEL (ICD-414.01)  Some atypical features.  They are not clearly ischemic, and somewhat atypical.  Not sure of the best approach, but given the findings would favor restudy to clarify where we are.  Would prefer this week, but patient needs to leave town.  Have made recommendations of location for him about where he is.  Told to seek attentiont if more symptoms. His updated medication list for this problem includes:    Plavix 75 Mg Tabs (Clopidogrel bisulfate) .Marland Kitchen... Take one tablet by mouth daily    Aspirin Ec 325 Mg Tbec (Aspirin) .Marland Kitchen... Take one tablet by mouth daily  Orders: EKG w/ Interpretation  (93000) Cardiac Catheterization (Cardiac Cath)  Problem # 2:  HYPERLIPIDEMIA-MIXED (ICD-272.4) Lipid profile at target.  No symptoms.    His updated medication list for this problem includes:    Crestor 40 Mg Tabs (Rosuvastatin calcium) .Marland Kitchen... Take one tablet by mouth daily.  Problem # 3:  TOBACCO ABUSE (ICD-305.1)  cannot seem to quit.    Orders: EKG w/ Interpretation (93000)  Patient Instructions: 1)  Your physician has requested  that you have a cardiac catheterization.  Cardiac catheterization is used to diagnose and/or treat various heart conditions. Doctors may recommend this procedure for a number of different reasons. The most common reason is to evaluate chest pain. Chest pain can be a symptom of coronary artery disease (CAD), and cardiac catheterization can show whether plaque is narrowing or blocking your heart's arteries. This procedure is also used to evaluate the valves, as well as measure the blood flow and oxygen levels in different parts of your heart.  For further information please visit [email or web address]  Please follow instruction sheet, as given. 2)  Your physician recommends that you schedule a follow-up appointment in: 2 MONTHS

## 2010-11-19 NOTE — Letter (Signed)
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

## 2010-11-19 NOTE — Assessment & Plan Note (Signed)
Summary: PER CHECK OUT/SF   Visit Type:  1 mo f/u Primary Provider:  Governor Specking  CC:  chest pain...denies any sob or edema.  History of Present Illness: Patient is not doing well with smoking.  Did try target date, but it has been difficult.  HIs lifestyle has been impacted, essentially with idle time.  He has started to have some chest discomfort.  It was stabbing, infrequent, and has no exertional discomfort.  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  It has  been too cold for the patient, as he has joint issues as well.   Current Medications (verified): 1)  Plavix 75 Mg Tabs (Clopidogrel Bisulfate) .... Take One Tablet By Mouth Daily 2)  Aspirin Ec 325 Mg Tbec (Aspirin) .... Take One Tablet By Mouth Daily 3)  Prozac 20 Mg Caps (Fluoxetine Hcl) .... Take 1 Capsule By Mouth Once A Day 4)  Zyprexa 5 Mg Tabs (Olanzapine) .... Take 1 Tablet By Mouth Once A Day 5)  Multivitamins  Tabs (Multiple Vitamin) .... Take 1 Tablet By Mouth Once A Day 6)  Crestor 40 Mg Tabs (Rosuvastatin Calcium) .... Take One Tablet By Mouth Daily.  Allergies (verified): No Known Drug Allergies  Past History:  Past Medical History: Last updated: 09/09/2009 1. [name] denies any history of diabetes, hypertension, or       hyperlipidemia, but has not had a recent checkup.   2. Ongoing tobacco use.   3. Ongoing EtOH use.   4. History of bipolar disorder.   5. History of syncope in 2001 and 2004 with no clear cause.   6. A 6-mm smoothly rounded nodule in the left lower lobe, seen on CT       of the chest in 2004, likely benign, but followup CT recommended,       not performed. 7.  PCI of diagonal with non DES stent.      Vital Signs:  Patient profile:   60 year old male Height:      66 inches Weight:      148 pounds BMI:     23.97 Pulse rate:   67 / minute Pulse rhythm:   regular BP sitting:   135 / 87  (left arm) Cuff size:   large  Vitals Entered By: Danielle Rankin, CMA (November 26, 2009 9:05  AM)  Physical Exam  General:  Well developed, well nourished, in no acute distress. Head:  normocephalic and atraumatic Lungs:  Clear bilaterally to auscultation and percussion. Heart:  Non-displaced PMI, chest non-tender; regular rate and rhythm, S1, S2 without murmurs, rubs or gallops. Carotid upstroke normal, no bruit. Normal abdominal aortic size, no bruits. Femorals normal pulses, no bruits. Pedals normal pulses. No edema, no varicosities. Msk:  Back normal, normal gait. Muscle strength and tone normal. Pulses:  pulses normal in all 4 extremities Extremities:  No clubbing or cyanosis. Neurologic:  Alert and oriented x 3.   EKG  Procedure date:  11/26/2009  Findings:      NSR.  WNL.  Impression & Recommendations:  Problem # 1:  CAD, NATIVE VESSEL (ICD-414.01) Symptoms atypical, and not similar. ECG normal.  Observe for now. The following medications were removed from the medication list:    Aspirin Ec 325 Mg Tbec (Aspirin) .Marland Kitchen... Take one tablet by mouth daily His updated medication list for this problem includes:    Plavix 75 Mg Tabs (Clopidogrel bisulfate) .Marland Kitchen... Take one tablet by mouth daily    Aspirin Ec 325 Mg Tbec (  Aspirin) .Marland Kitchen... Take one tablet by mouth daily  Problem # 2:  HYPERLIPIDEMIA-MIXED (ICD-272.4) Tolerating Crestor reasonably well. His updated medication list for this problem includes:    Crestor 40 Mg Tabs (Rosuvastatin calcium) .Marland Kitchen... Take one tablet by mouth daily.  Problem # 3:  TOBACCO ABUSE (ICD-305.1) unable to quit.  He has not stopped ETOH, but now not to excess;  a little on Friday and Saturday  Patient Instructions: 1)  Your physician recommends that you schedule a follow-up appointment in: 2 MNOTHS

## 2010-11-19 NOTE — Cardiovascular Report (Signed)
Summary: Cardiac Cath Lab  Cardiac Cath Lab   Imported By: Kassie Mends 11/17/2009 11:30:44  _____________________________________________________________________  External Attachment:    Type:   Image     Comment:   External Document

## 2010-11-19 NOTE — Letter (Signed)
Summary: Cardiac Catheterization Instructions- Main Lab  [facility], Main Office  [address]   [place], [place] [address]   Phone: [phone]  Fax: [phone]     01/30/2010 MRN: [id number]  UTAH DELAUDER [address] [place], [place]  [address]  Dear Mr. BIJAN, RIDGLEY are scheduled for Cardiac Catheterization on Tuesday February 10, 2010              with Dr. Riley Kill.  Please arrive at the [facility] at 8:30      a.m. on the day of your procedure.  1. DIET     _X___ Nothing to eat or drink after midnight except your medications with a sip of water.  2. MAKE SURE YOU TAKE YOUR ASPIRIN AND PLAVIX.  3. _X___ YOU MAY TAKE ALL of your remaining medications with a small amount of water.       4. Plan for one night stay - bring personal belongings (i.e. toothpaste, toothbrush, etc.)  5. Bring a current list of your medications and current insurance cards.  6. Must have a responsible person to drive you home.   7. Someone must be with you for the first 24 hours after you arrive home.  8. Please wear clothes that are easy to get on and off and wear slip-on shoes.  *Special note: Every effort is made to have your procedure done on time.  Occasionally there are emergencies that present themselves at the hospital that may cause delays.  Please be patient if a delay does occur.  If you have any questions after you get home, please call the office at the number listed above.  Julieta Gutting, RN, BSN

## 2010-11-19 NOTE — Cardiovascular Report (Signed)
Summary: Cath/Percutaneous Orders  Cath/Percutaneous Orders   Imported By: Roderic Ovens 02/11/2010 12:58:11  _____________________________________________________________________  External Attachment:    Type:   Image     Comment:   External Document

## 2010-11-23 ENCOUNTER — Telehealth (INDEPENDENT_AMBULATORY_CARE_PROVIDER_SITE_OTHER): Payer: Self-pay | Admitting: *Deleted

## 2010-12-03 NOTE — Progress Notes (Addendum)
Summary: Need Plavix ordered fro [facility]  Phone Note Refill Request Message from:  Patient on November 23, 2010 10:59 AM  Refills Requested: Medication #1:  PLAVIX 75 MG TABS Take one tablet by mouth daily Pt need Plavix  ordered fro [facility]  Initial call taken by: Judie Grieve,  November 23, 2010 10:59 AM  Follow-up for Phone Call        Plavix ordered. Will arrive in about 1 week. Vikki Ports  November 24, 2010 12:46 PM      Appended Document: Need Plavix ordered fro [facility] Plavix arrived and placed at the front desk for pick-up.  Left message on pt's phone.  Customer PO #[id number], Order #[id number].

## 2011-01-05 LAB — CBC
HCT: 37.5 % — ABNORMAL LOW (ref 39.0–52.0)
MCHC: 35.3 g/dL (ref 30.0–36.0)
MCV: 94.7 fL (ref 78.0–100.0)
Platelets: 196 10*3/uL (ref 150–400)
RDW: 13.2 % (ref 11.5–15.5)

## 2011-01-05 LAB — BASIC METABOLIC PANEL
BUN: 19 mg/dL (ref 6–23)
CO2: 26 mEq/L (ref 19–32)
GFR calc non Af Amer: >60 mL/min (ref >60)
Glucose, Bld: 117 mg/dL — ABNORMAL HIGH (ref 70–99)
Potassium: 4 mEq/L (ref 3.5–5.1)

## 2011-01-12 ENCOUNTER — Telehealth: Payer: Self-pay | Admitting: Cardiology

## 2011-01-12 NOTE — Telephone Encounter (Signed)
Left message for pt to call back  °

## 2011-01-12 NOTE — Telephone Encounter (Signed)
Pt calling having chest pains for about 2 week-pls advise

## 2011-01-12 NOTE — Telephone Encounter (Signed)
Dr Riley Kill aware of conversation with patient and agreed that pt should be seen in ER.

## 2011-01-12 NOTE — Telephone Encounter (Signed)
I spoke with the pt and he complains of increasing frequency of CP over the past 2 weeks. This is occuring on a daily basis and "lasts all day, every day".  Symptoms are remaining constant in his left breast.  The pt denies any change of symptoms with inspiration, movement, or touch.  The pt said his symptoms feel like when he had heart attack before.  As the conversation continued the pt said his CP will worsen and then diminish.  The pt rates pain as 5-6/10.  The pt has been using NTG with improvement in symptoms.  I instructed the pt to go to the [facility] ER for further evaluation.  The pt asked if he could go tomorrow and I instructed that he needs to be evaluated today.  The pt said he would call me back if he decided to go to the ER today.

## 2011-01-14 NOTE — Telephone Encounter (Signed)
I once again spoke with Dr Riley Kill and because the pt has not gone into the ER for symptoms he would like the pt seen in the office tomorrow by Dr Tenny Craw (DOD).  I left a voice mail on the pt's answering machine to call our office on 01/15/11 to schedule same day appointment with Dr Tenny Craw for evaluation of symptoms.

## 2011-01-14 NOTE — Telephone Encounter (Signed)
I spoke with the pt and he did not go into the ER as recommended. The pt states he has been feeling pretty good the past 2 days.  While speaking with the pt we got disconnected.

## 2011-01-18 ENCOUNTER — Emergency Department (HOSPITAL_COMMUNITY): Payer: Self-pay

## 2011-01-18 ENCOUNTER — Inpatient Hospital Stay (HOSPITAL_COMMUNITY)
Admission: EM | Admit: 2011-01-18 | Discharge: 2011-01-19 | DRG: 313 | Disposition: A | Discharge status: Home / Self Care | Payer: Self-pay | Attending: Cardiology | Admitting: Cardiology

## 2011-01-18 DIAGNOSIS — R911 Solitary pulmonary nodule: Secondary | ICD-10-CM | POA: Diagnosis present

## 2011-01-18 DIAGNOSIS — Z79899 Other long term (current) drug therapy: Secondary | ICD-10-CM

## 2011-01-18 DIAGNOSIS — R0789 Other chest pain: Principal | ICD-10-CM | POA: Diagnosis present

## 2011-01-18 DIAGNOSIS — F319 Bipolar disorder, unspecified: Secondary | ICD-10-CM | POA: Diagnosis present

## 2011-01-18 DIAGNOSIS — E785 Hyperlipidemia, unspecified: Secondary | ICD-10-CM | POA: Diagnosis present

## 2011-01-18 DIAGNOSIS — I2510 Atherosclerotic heart disease of native coronary artery without angina pectoris: Secondary | ICD-10-CM | POA: Diagnosis present

## 2011-01-18 DIAGNOSIS — R079 Chest pain, unspecified: Secondary | ICD-10-CM

## 2011-01-18 DIAGNOSIS — Z7982 Long term (current) use of aspirin: Secondary | ICD-10-CM

## 2011-01-18 DIAGNOSIS — F17200 Nicotine dependence, unspecified, uncomplicated: Secondary | ICD-10-CM | POA: Diagnosis present

## 2011-01-18 LAB — CK TOTAL AND CKMB (NOT AT ARMC)
CK, MB: 1.4 ng/mL (ref 0.3–4.0)
Total CK: 88 U/L (ref 7–232)

## 2011-01-18 LAB — CBC
HCT: 40 % (ref 39.0–52.0)
Hemoglobin: 13.8 g/dL (ref 13.0–17.0)
MCH: 32.1 pg (ref 26.0–34.0)
MCHC: 34.5 g/dL (ref 30.0–36.0)
MCV: 93 fL (ref 78.0–100.0)

## 2011-01-18 LAB — DIFFERENTIAL
Lymphocytes Relative: 18 % (ref 12–46)
Lymphs Abs: 1.5 10*3/uL (ref 0.7–4.0)
Monocytes Absolute: 0.8 10*3/uL (ref 0.1–1.0)
Monocytes Relative: 9 % (ref 3–12)
Neutro Abs: 5.8 10*3/uL (ref 1.7–7.7)

## 2011-01-18 LAB — BASIC METABOLIC PANEL
CO2: 24 mEq/L (ref 19–32)
Calcium: 9.6 mg/dL (ref 8.4–10.5)
Chloride: 105 mEq/L (ref 96–112)
GFR calc Af Amer: >60 mL/min (ref >60)
Sodium: 138 mEq/L (ref 135–145)

## 2011-01-18 LAB — HEPARIN LEVEL (UNFRACTIONATED): Heparin Unfractionated: 0.41 IU/mL (ref 0.30–0.70)

## 2011-01-18 LAB — POCT CARDIAC MARKERS
CKMB, poc: <1 ng/mL — ABNORMAL LOW (ref 1.0–8.0)
Myoglobin, poc: 123 ng/mL (ref 12–200)
Myoglobin, poc: 82.9 ng/mL (ref 12–200)

## 2011-01-18 NOTE — Telephone Encounter (Signed)
Pt currently in the ER for evaluation of symptoms.

## 2011-01-19 ENCOUNTER — Inpatient Hospital Stay (HOSPITAL_COMMUNITY): Payer: Self-pay

## 2011-01-19 LAB — CARDIAC PANEL(CRET KIN+CKTOT+MB+TROPI)
CK, MB: 1.3 ng/mL (ref 0.3–4.0)
Relative Index: INVALID (ref 0.0–2.5)
Total CK: 69 U/L (ref 7–232)
Troponin I: 0.01 ng/mL (ref 0.00–0.06)
Troponin I: 0.01 ng/mL (ref 0.00–0.06)

## 2011-01-19 LAB — CBC
Platelets: 193 10*3/uL (ref 150–400)
RBC: 4.06 MIL/uL — ABNORMAL LOW (ref 4.22–5.81)
WBC: 7.9 10*3/uL (ref 4.0–10.5)

## 2011-01-19 LAB — LIPID PANEL
HDL: 61 mg/dL (ref >39)
Total CHOL/HDL Ratio: 2.1 RATIO
VLDL: 26 mg/dL (ref 0–40)

## 2011-01-19 LAB — HEPARIN LEVEL (UNFRACTIONATED): Heparin Unfractionated: 0.23 IU/mL — ABNORMAL LOW (ref 0.30–0.70)

## 2011-01-19 MED ORDER — TECHNETIUM TC 99M TETROFOSMIN IV KIT
10.0000 | PACK | Freq: Once | INTRAVENOUS | Status: AC | PRN
  Administered 2011-01-19: 10 via INTRAVENOUS

## 2011-01-19 MED ORDER — TECHNETIUM TC 99M TETROFOSMIN IV KIT
30.0000 | PACK | Freq: Once | INTRAVENOUS | Status: AC | PRN
  Administered 2011-01-19: 30 via INTRAVENOUS

## 2011-01-20 ENCOUNTER — Other Ambulatory Visit (HOSPITAL_COMMUNITY): Payer: Self-pay

## 2011-01-22 LAB — LIPID PANEL
Cholesterol: 174 mg/dL (ref 0–200)
HDL: 48 mg/dL (ref >39)
LDL Cholesterol: 115 mg/dL — ABNORMAL HIGH (ref 0–99)
Total CHOL/HDL Ratio: 3.6 RATIO
Triglycerides: 57 mg/dL (ref <150)

## 2011-01-22 LAB — HEPATIC FUNCTION PANEL
ALT: 23 U/L (ref 0–53)
AST: 25 U/L (ref 0–37)
Albumin: 3.1 g/dL — ABNORMAL LOW (ref 3.5–5.2)
Alkaline Phosphatase: 84 U/L (ref 39–117)
Total Bilirubin: 0.7 mg/dL (ref 0.3–1.2)
Total Protein: 5.3 g/dL — ABNORMAL LOW (ref 6.0–8.3)

## 2011-01-22 LAB — CARDIAC PANEL(CRET KIN+CKTOT+MB+TROPI)
CK, MB: 52.5 ng/mL — ABNORMAL HIGH (ref 0.3–4.0)
Total CK: 473 U/L — ABNORMAL HIGH (ref 7–232)
Total CK: 507 U/L — ABNORMAL HIGH (ref 7–232)
Total CK: 544 U/L — ABNORMAL HIGH (ref 7–232)
Troponin I: 9.89 ng/mL (ref 0.00–0.06)

## 2011-01-22 LAB — BASIC METABOLIC PANEL
CO2: 21 mEq/L (ref 19–32)
CO2: 21 mEq/L (ref 19–32)
Calcium: 8.5 mg/dL (ref 8.4–10.5)
Calcium: 9.2 mg/dL (ref 8.4–10.5)
Creatinine, Ser: 1.02 mg/dL (ref 0.4–1.5)
GFR calc Af Amer: >60 mL/min (ref >60)
Glucose, Bld: 128 mg/dL — ABNORMAL HIGH (ref 70–99)
Potassium: 3.7 mEq/L (ref 3.5–5.1)
Sodium: 137 mEq/L (ref 135–145)

## 2011-01-22 LAB — DIFFERENTIAL
Basophils Absolute: 0.1 10*3/uL (ref 0.0–0.1)
Basophils Relative: 1 % (ref 0–1)
Eosinophils Absolute: 0.3 10*3/uL (ref 0.0–0.7)
Monocytes Absolute: 0.9 10*3/uL (ref 0.1–1.0)
Neutro Abs: 8 10*3/uL — ABNORMAL HIGH (ref 1.7–7.7)
Neutrophils Relative %: 67 % (ref 43–77)

## 2011-01-22 LAB — POCT I-STAT, CHEM 8
BUN: 12 mg/dL (ref 6–23)
Chloride: 110 mEq/L (ref 96–112)
Creatinine, Ser: 0.8 mg/dL (ref 0.4–1.5)
Sodium: 138 mEq/L (ref 135–145)
TCO2: 16 mmol/L (ref 0–100)

## 2011-01-22 LAB — PROTIME-INR: INR: 0.9 (ref 0.00–1.49)

## 2011-01-22 LAB — CBC
MCHC: 34.2 g/dL (ref 30.0–36.0)
MCHC: 34.3 g/dL (ref 30.0–36.0)
RBC: 4.35 MIL/uL (ref 4.22–5.81)
RDW: 13.6 % (ref 11.5–15.5)
RDW: 13.9 % (ref 11.5–15.5)

## 2011-01-22 LAB — POCT CARDIAC MARKERS

## 2011-01-22 LAB — DRUGS OF ABUSE SCREEN W/O ALC, ROUTINE URINE
Benzodiazepines.: NEGATIVE
Cocaine Metabolites: NEGATIVE
Creatinine,U: 40.1 mg/dL
Methadone: NEGATIVE
Opiate Screen, Urine: NEGATIVE
Phencyclidine (PCP): NEGATIVE

## 2011-01-22 LAB — POCT I-STAT 3, ART BLOOD GAS (G3+)
Bicarbonate: 16.5 mEq/L — ABNORMAL LOW (ref 20.0–24.0)
O2 Saturation: 99 %
TCO2: 17 mmol/L (ref 0–100)
pCO2 arterial: 18.3 mmHg — CL (ref 35.0–45.0)

## 2011-02-10 NOTE — Discharge Summary (Signed)
NAME:  Jeffrey Hudson, Jeffrey Hudson NO.:  [id number]  MEDICAL RECORD NO.:  [id number]           PATIENT TYPE:  I  LOCATION:  3713                         FACILITY:  [facility]  PHYSICIAN:  Veverly Fells. Excell Seltzer, MD  DATE OF BIRTH:  [birth year]  DATE OF ADMISSION:  01/18/2011 DATE OF DISCHARGE:  01/19/2011                              DISCHARGE SUMMARY   PRIMARY CARDIOLOGIST:  Maisie Fus D. Riley Kill, MD, [facility]  PRIMARY CARE PHYSICIAN:  Eugenio Hoes. Tawanna Cooler, MD  DISCHARGE DIAGNOSES: 1. Noncardiac chest pain.     a.     Lexiscan Myoview January 19, 2011:  Negative for pharmacologic      - stress-induced ischemia.  Left ventricular ejection fraction      normal at 64%. 2. Ongoing tobacco abuse.     a.     A long discussion/education/instruction as to importance of      tobacco cessation.  The patient will review safety/risk-benefit      ratio of Chantix with his psychiatrist.  SECONDARY DIAGNOSES: 1. Coronary artery disease.     a.     Cardiac catheterization 2011:  Diagonal 40-50% stenosis in      the stent, circumflex 50% distal stenosis, right coronary artery      30% distal stenosis, posterolateral 60% stenosis, left ventricular      ejection fraction normal. 2. Dyslipidemia. 3. History of pulmonary nodule (followed and apparently benign).  ALLERGIES:  NKDA.  PROCEDURES: 1. EKG January 18, 2011:  NSR, 68 bpm without acute ST-T-wave changes. 2. Chest x-ray January 18, 2011:  No acute disease. 3. Lexiscan Myoview January 19, 2011:  Please see discharge diagnosis #1     subsection A.  HISTORY OF PRESENT ILLNESS:  Jeffrey Hudson is a 60 year old gentleman with the above-noted complex medical history including stenting to the diagonal branch with last cath in April with results described in secondary diagnoses, subsection A.  The patient presented with chest discomfort, left upper extremity discomfort near his shoulder.  He has had these symptoms for the past 2 weeks, which have been waxing  and waning, but it had never really gone  away.  He took nitroglycerin 4-5 times over this period with some improvement in symptoms.  No clear aggravating factor, but does feel it is most closely associated with smoking.  He has had some associated shortness of breath, but no nausea or vomiting.  No discomfort in his neck or upper extremity other than his left shoulder.  His symptoms are not exertional, positional, pleuritic.  HOSPITAL COURSE:  Due to the atypical nature of his symptoms and without any clear objective evidence, the patient underwent risk stratification with Lexiscan Myoview on the morning of January 19, 2011.  Luckily, there was no evidence of reversibility in his images and he continued to have a normal left ventricular ejection fraction.  Due to these findings, the patient was deemed stable for discharge in the late afternoon of January 19, 2011.  The patient had extensive discussion about tobacco cessation with Rollene Rotunda, attending cardiologist, who admitted him.  Also discussed with physician assistant, Jarrett Ables prior to discharge. The patient is  highly motivated to quit and will discuss risks/benefits of taking Chantix in light of the need for Prozac and Zyprexa given his bipolar disorder.  Also given information for outpatient discussion with tobacco cessation counselor, Cena Benton.  At the time of discharge, the patient received his old medication list unchanged, followup instructions, and all questions and concerns were addressed prior to his leaving the hospital.  DISCHARGE LABS:  WBC is 7.9, HGB 13.2, HCT 37.7, PLT count 193, WBC differential on admission was within normal limits.  Sodium 138, potassium 4.4, chloride 105, bicarb 24, BUN 19, creatinine 1.44, glucose 111, calcium 9.6.  Point-of-care markers negative x1.  Cardiac enzymes negative x4 full sets.  Total cholesterol 126, triglycerides 130, HDL 61, LDL 39, total cholesterol/HDL ratio  2.1.  FOLLOWUP PLANS AND APPOINTMENTS: 1. Dr. Shawnie Pons in 3-4 weeks (the patient will be contacted). 2. The patient's psychiatrist in 1-2 weeks to discuss psychotropic     medications, and risk/benefit of Chantix for tobacco cessation. 3. Primary care physician in 3-4 weeks.  DISCHARGE MEDICATIONS: 1. Enteric-coated aspirin 81 mg p.o. daily. 2. Clopidogrel 75 mg p.o. nightly. 3. Crestor 40 mg p.o. nightly. 4. Fluoxetine 20 mg 1 capsule p.o. nightly. 5. Multivitamin 1 tablet p.o. daily. 6. Sublingual nitroglycerin 0.4 mg sublingual q.5 minutes up to three     doses p.r.n. for chest discomfort. 7. Triamcinolone topical cream 0.25% one application topically daily     p.r.n. 8. Zyprexa 5 mg 1 tablet p.o. nightly.  Duration of discharge encounter including physician time was 35 minutes.     Jarrett Ables, PAC   ______________________________ Veverly Fells. Excell Seltzer, MD    MS/MEDQ  D:  01/19/2011  T:  01/20/2011  Job:  [id number]  cc:   Tinnie Gens A. Tawanna Cooler, MD Arturo Morton. Riley Kill, MD, [facility]  Electronically Signed by Jarrett Ables PAC on 01/26/2011 10:44:52 AM Electronically Signed by Tonny Bollman MD on 02/10/2011 10:26:56 AM

## 2011-02-12 NOTE — H&P (Signed)
NAME:  Jeffrey Hudson, Jeffrey Hudson NO.:  [id number]  MEDICAL RECORD NO.:  [id number]           PATIENT TYPE:  E  LOCATION:  [facility]                         FACILITY:  [facility]  PHYSICIAN:  Rollene Rotunda, MD, FACCDATE OF BIRTH:  Feb 22, 1951  DATE OF ADMISSION:  01/18/2011 DATE OF DISCHARGE:                             HISTORY & PHYSICAL   PRIMARY CARE PHYSICIAN:  Tinnie Gens A. Tawanna Cooler, MD.  CARDIOLOGIST:  Arturo Morton. Riley Kill, MD, [facility].  REASON FOR PRESENTATION:  Evaluate the patient with chest pain.  HISTORY OF PRESENT ILLNESS:  The patient is a 60 year old white gentleman with a past history of coronary disease, status post stenting to the diagonal.  His last cath as described below in April of last year.  He presents again with chest discomfort.  This is a left upper chest discomfort near his left shoulder.  It is similar to what he had in April of last year, when he was managed medically for his nonobstructive disease.  It has been ongoing for 2 weeks.  He says it does not go away.  It does wax and wane in intensity.  He was told to come to the emergency room because of this discomfort.  He has taken nitroglycerin approximately 4-5 times in the last 2 weeks.  This will improve his symptoms.  He has had some associated shortness of breath. There has been no nausea or vomiting.  There has been no neck or arm discomfort.  He cannot bring this on with activity and is not positional or worse with inspiration.  In the emergency room, he was not found to have any acute EKG changes and point-of-care markers have been negative x2.  PAST MEDICAL HISTORY:  Coronary artery disease (catheterization April 2011; diagonal stent 40-50% stenosis in the stent, circumflex 50% distal stenosis, right coronary artery 30% distal stenosis, posterolateral 60% stenosis.  EF was normal), alcohol use, ongoing tobacco abuse, pulmonary nodule (followed and apparently benign), bipolar disorder,  dyslipidemia.  PAST SURGICAL HISTORY:  None.  ALLERGIES:  None.  MEDICATIONS:  Zyprexa 5 mg daily, triamcinolone topical, nitroglycerin, multivitamin, aspirin 81 mg daily, fluoxetine 20 mg daily, Crestor 40 mg daily, Plavix 75 mg daily.  SOCIAL HISTORY:  The patient is married.  He has been smoking one pack per day for over 40 years.  FAMILY HISTORY:  Noncontributory for early coronary artery disease.  REVIEW OF SYSTEMS:  As stated in the HPI, negative for all other systems.  PHYSICAL EXAMINATION:  GENERAL:  The patient is pleasant and in no distress. VITAL SIGNS:  Blood pressure 137/77, heart rate 56 and regular, respiratory rate 16, afebrile. HEENT:  Eyelids unremarkable.  Pupils equal, round, reactive to light. Fundi not visualized.  Oral mucosa unremarkable. NECK:  No jugular distention, 45 degrees, carotid upstroke brisk and symmetrical.  No bruits, no thyromegaly. LYMPHATICS:  No cervical, axillary, inguinal adenopathy. LUNGS:  Clear to auscultation bilaterally. BACK:  No costovertebral angle tenderness. CHEST:  Unremarkable. HEART:  PMI not displaced or sustained.  S1 and S2 within normal.  No S3, S4.  No clicks, no rubs, no murmurs. ABDOMEN:  Flat, positive bowel sounds, normal  in frequency and pitch. No bruits, no rebound, no guarding, no midline pulsatile mass, no hepatomegaly, no splenomegaly.  SKIN:  No rashes, no nodules. EXTREMITIES:  2+ pulses throughout.  No edema, no cyanosis, no clubbing. NEURO:  Oriented to person, place, and time.  Cranial nerves II through XII grossly intact.  Motor grossly intact.  LABORATORY DATA:  EKG; sinus rhythm, rate 68, no acute ST-T wave changes.  Troponin, CK-MB, point-of-care markers negative x2.  Sodium 138, potassium 4.4, BUN 19, creatinine 1.44.  WBC 8.4, hemoglobin 13.8, platelets 204,000.  Chest x-ray; no acute disease.  ASSESSMENT/PLAN: 1. Chest:  The patient's chest pain is atypical.  Because of his     ongoing  risk factors and previous coronary disease, he will be     ruled out for myocardial infarction.  If he has no enzyme changes     or EKG changes, stress perfusion imaging would be appropriate. 2. Tobacco abuse.  I discussed this with him.  Dr. Tawanna Cooler has suggested     Chantix.  I have suggested that he discuss this with his     psychiatrist to see if this would be appropriate, and I would     suggest this as well if approved by psychiatry. 3. Dyslipidemia.  Check a lipid profile.  He is on 40 mg of Crestor,     and I will continue this. 4. Lung nodule.  He said this has been followed and has been benign. 5. Bipolar disorder.  He will continue his previous medications.     Rollene Rotunda, MD, [facility]     JH/MEDQ  D:  01/18/2011  T:  01/18/2011  Job:  [id number]  Electronically Signed by Rollene Rotunda MD [facility] on 02/12/2011 11:45:59 AM

## 2011-02-16 ENCOUNTER — Encounter: Payer: Self-pay | Admitting: Cardiology

## 2011-02-17 ENCOUNTER — Ambulatory Visit (INDEPENDENT_AMBULATORY_CARE_PROVIDER_SITE_OTHER): Payer: Self-pay | Admitting: Cardiology

## 2011-02-17 ENCOUNTER — Encounter: Payer: Self-pay | Admitting: Cardiology

## 2011-02-17 DIAGNOSIS — E785 Hyperlipidemia, unspecified: Secondary | ICD-10-CM

## 2011-02-17 DIAGNOSIS — I2510 Atherosclerotic heart disease of native coronary artery without angina pectoris: Secondary | ICD-10-CM

## 2011-02-17 NOTE — Patient Instructions (Signed)
Your physician recommends that you schedule a follow-up appointment in: 3 months with Dr. Stuckey 

## 2011-02-25 NOTE — Progress Notes (Signed)
HPI:  [name] comes in for a follow up visit.  In general, he is stable and doing much better than he was.  His symptoms, from whatever cause, have cooled down.  Was using NTG, now very little.  He is working hard on the smoking, down now to less than one half pack per day.  Understands the issue pretty well, and we reviewed methods with him in the office.    Current Outpatient Prescriptions  Medication Sig Dispense Refill  . aspirin 81 MG tablet Take 81 mg by mouth daily.        . clopidogrel (PLAVIX) 75 MG tablet Take 75 mg by mouth daily.        Marland Kitchen FLUoxetine (PROZAC) 20 MG capsule Take 20 mg by mouth daily.        . Multiple Vitamin (MULTIVITAMIN) tablet Take 1 tablet by mouth daily.        . nitroGLYCERIN (NITROSTAT) 0.4 MG SL tablet Place 0.4 mg under the tongue every 5 (five) minutes as needed.        Marland Kitchen OLANZapine (ZYPREXA) 5 MG tablet Take 5 mg by mouth at bedtime.        . rosuvastatin (CRESTOR) 40 MG tablet Take 40 mg by mouth daily.        Marland Kitchen triamcinolone (KENALOG) 0.025 % cream Apply topically 2 (two) times daily.          Allergies no known allergies  Past Medical History  Diagnosis Date  . Nodule of left lung     6-mm smooothly rounded nodule  . Coronary artery disease   . AMI (acute myocardial infarction)     Acute ST elevation  . Hyperlipidemia   . Syncope and collapse     in 2001 and 2004 with no clear cause  . Tobacco abuse   . Bipolar disorder     No past surgical history on file.  Family History  Problem Relation Age of Onset  . Other Mother 58    died  . Alcohol abuse Father 11    History   Social History  . Marital Status: Married    Spouse Name: N/A    Number of Children: N/A  . Years of Education: N/A   Occupational History  . Not on file.   Social History Main Topics  . Smoking status: Current Everyday Smoker    Types: Cigarettes  . Smokeless tobacco: Not on file   Comment: 1 pack a day  . Alcohol Use: Yes     heavy use of EtOH with  8-10 beers daily  . Drug Use: No     marijuana quit 2010. He has hx of THC use.  Marland Kitchen Sexually Active: Not on file   Other Topics Concern  . Not on file   Social History Narrative  . No narrative on file    ROS: Please see the HPI.  All other systems reviewed and negative.  PHYSICAL EXAM:  BP 138/80  Pulse 76  Ht 5\' 6"  (1.676 m)  Wt 154 lb 12.8 oz (70.217 kg)  BMI 24.99 kg/m2  General: Well developed, well nourished, in no acute distress. Head:  Normocephalic and atraumatic. Neck: no JVD Lungs: Clear to auscultation and percussion. Heart: Normal S1 and S2.  No murmur, rubs or gallops.  Abdomen:  Normal bowel sounds; soft; non tender; no organomegaly Pulses: Pulses normal in all 4 extremities. Extremities: No clubbing or cyanosis. No edema. Neurologic: Alert and oriented x 3.  EKG:  ASSESSMENT AND PLAN:

## 2011-02-25 NOTE — Assessment & Plan Note (Signed)
Better at present.  Will continue to follow.  He says he is back to how he was and doing well. Continue to monitor at present.

## 2011-02-25 NOTE — Assessment & Plan Note (Signed)
See results of lipid profile.  Continue to follow.  TS

## 2011-03-03 ENCOUNTER — Telehealth: Payer: Self-pay | Admitting: Cardiology

## 2011-03-03 NOTE — Telephone Encounter (Signed)
I contacted [employer] and the pt assistance program ends tomorrow.  The pt's plavix will arrive within 7-10 business days.  Per [employer] the pt will get an additional shipment of Plavix for 90 days that will include a letter letting him know that the program has ended.  [employer] is unsure when this additional shipment will be sent out to the patient.

## 2011-03-03 NOTE — Telephone Encounter (Signed)
Pt needs to reorder plavix 75mg  through pt assistance program pls call when ready [phone]

## 2011-03-05 NOTE — H&P (Signed)
NAME:  Jeffrey Hudson, LUPERCIO NO.:  [id number]   MEDICAL RECORD NO.:  [id number]                   PATIENT TYPE:  INP   LOCATION:  0160                                 FACILITY:  [facility]   PHYSICIAN:  Sean A. Everardo All, M.D. [facility]           DATE OF BIRTH:  1951-01-29   DATE OF ADMISSION:  06/28/2003  DATE OF DISCHARGE:                                HISTORY & PHYSICAL   REASON FOR ADMISSION:  Syncope.   HISTORY OF PRESENT ILLNESS:  This patient is a 60 year old man who at 9:30  this evening had two drinks of alcohol.  He experienced chest tightness for  a few seconds and then had a syncopal episode for a few seconds.  He fell,  striking the back of his head.  He states that this syncopal episode lasted  a few seconds and that there was associated excessive diaphoresis.  The call  was made to 911, and rescuers found his blood pressure to be 50 systolic.  He was brought to the emergency department where he states he feels better.   PAST MEDICAL HISTORY:  1. Bipolar disorder.  2. Previous episode of syncope three years ago.  Only apparent workup was     CPK check.   MEDICATIONS:  Symbyax 6/25, 1 daily.   SOCIAL HISTORY:  The patient is married.  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   FAMILY HISTORY:  Negative for unexplained syncope.   REVIEW OF SYSTEMS:  Denies the following:  Seizure, rectal bleeding,  hematuria, fever, weight loss, dysuria, shortness of breath, nausea,  vomiting, headache, skin rash, and visual loss.   PHYSICAL EXAMINATION:  VITAL SIGNS:  Blood pressure 70/50, heart rate is 61,  respiratory rate is 20, afebrile.  GENERAL:  In no distress.  SKIN:  Not diaphoretic.  HEENT:  Head:  There is a slight ecchymosis at the right parietal area with  a minimal amount of bleeding.  I do not appreciate a laceration.  The  sclerae are nonicteric.  Pharynx clear.  NECK:  Supple.  CHEST:  Clear to auscultation.  CARDIOVASCULAR:  No JVD.  No edema.  Regular  rate and rhythm.  No murmur.  Pedal pulses are intact.  ABDOMEN:  Soft and nontender.  No hepatosplenomegaly.  No mass.  GENITOURINARY, RECTAL:  Not done at this time due to patient condition.  EXTREMITIES:  Warm to touch, and there is no deformity.  NEUROLOGIC:  Alert, well-oriented.  Does not appear anxious or depressed.  Cranial nerves appear to be intact, and he moves all fours.   LABORATORY STUDIES:  Electrocardiogram is normal.   CMET is normal.  CBC is normal except for WBC 16,400.  Hemoglobin 14.9.    IMPRESSION:  1. Episode of hypotension and syncope, uncertain etiology.  2. Elevated white blood count, uncertain etiology.  3. Bipolar disorder with episode of inappropriate consumption of alcohol     with his  medications.   PLAN:  1. Admit to telemetry.  2. Check CPKs.  3. Recheck CBC.  4. The patient is advised to discontinue alcohol.  5. If CPKs are negative, outpatient echocardiogram and Cardiolite could be     considered.                                               Sean A. Everardo All, M.D. [facility]    SAE/MEDQ  D:  06/29/2003  T:  06/29/2003  Job:  [id number]

## 2011-03-05 NOTE — Discharge Summary (Signed)
   NAME:  Jeffrey Hudson, Jeffrey Hudson NO.:  [id number]   MEDICAL RECORD NO.:  [id number]                   PATIENT TYPE:  INP   LOCATION:  0359                                 FACILITY:  [facility]   PHYSICIAN:  Sean A. Everardo All, M.D. [facility]           DATE OF BIRTH:  06/20/1951   DATE OF ADMISSION:  06/29/2003  DATE OF DISCHARGE:  06/30/2003                                 DISCHARGE SUMMARY   DISCHARGE DIAGNOSES:  1. Chest pain, uncertain etiology.  2. Syncope, uncertain etiology.  3. History of bipolar effective disorder.  4. Consumption of alcohol and marijuana.  5. Mild hypokalemia.   REASON FOR ADMISSION:  Chest pain and syncope.   HISTORY OF PRESENT ILLNESS:  The patient is a 60 year old man admitted by me  on June 29, 2003, with chest tightness and syncope.  Please refer to my  dictated history and physical for details.   HOSPITAL COURSE:  The patient was admitted and head CT showed no acute  abnormality.  CPKs were negative.  He had no chest pain nor dizziness/loss  of consciousness during this hospitalization.  Laboratory studies were  remarkable only for a minimally low potassium and drug screen was positive  for a small amount of alcohol, as well as for marijuana.  By June 30, 2003, the patient was alert, oriented, ambulatory, eating his usual diet,  and was thus discharged home.  Serum potassium was minimally low, and he was  given one dose of potassium chloride.   MEDICATIONS:  1. Symbyax 6/25 one daily.  2. Sublingual nitroglycerin 0.4 mg every five minutes as needed for chest     pain.  3. Aspirin 81 mg daily.   ACTIVITY:  Do not drive until advised okay by doctor.   INSTRUCTIONS:  The patient is to avoid alcohol and illicit drugs completely.   FOLLOWUP:  1. The patient will be scheduled tomorrow for an echocardiography and     treadmill Cardiolite.  2. The patient will call for an appointment for Dr. Tawanna Cooler about four business     days  later to review results.   DIET:  No restriction on diet.      SAE/MEDQ  D:  06/30/2003  T:  06/30/2003  Job:  [id number]   cc:   Tinnie Gens A. Tawanna Cooler, M.D. [facility]

## 2011-03-08 ENCOUNTER — Telehealth: Payer: Self-pay | Admitting: Cardiology

## 2011-03-08 NOTE — Telephone Encounter (Signed)
Pt calling re plavix , received letter that he's no longer eligible for pt assitane program because he can take generic, wants to know what dr Riley Kill thinks about this

## 2011-03-09 NOTE — Telephone Encounter (Signed)
I left a message for the pt to let him know that he will be able to take generic plavix when available.  The pt will also get another free 90 day supply of Plavix from the company. I will call the pt when the plavix arrives.

## 2011-03-19 ENCOUNTER — Telehealth: Payer: Self-pay | Admitting: Cardiology

## 2011-03-19 MED ORDER — CLOPIDOGREL BISULFATE 75 MG PO TABS: 75.0000 mg | ORAL_TABLET | Freq: Every day | ORAL | Status: DC

## 2011-03-19 NOTE — Telephone Encounter (Signed)
Pt states he needs plavix. Pt wants lauren to give him a call once its order.

## 2011-03-19 NOTE — Telephone Encounter (Signed)
I spoke with [employer] and they said that the pt's plavix was shipped out 2 days ago and should arrive in our office next week. I made the pt aware that plavix should be arriving next week.  The pt is currently out of plavix and I sent in a local Rx to [facility] now that the medication is generic. The pt will go to the pharmacy to pick-up Rx.

## 2011-03-24 ENCOUNTER — Other Ambulatory Visit: Payer: Self-pay | Admitting: *Deleted

## 2011-03-24 NOTE — Telephone Encounter (Signed)
Left message for patient that Plavix has arrived. Plavix placed at the front desk for pick up. Customer PO # [id number]. Order # [id number]

## 2011-03-25 ENCOUNTER — Encounter: Payer: Self-pay | Admitting: Cardiology

## 2011-03-25 NOTE — Telephone Encounter (Signed)
error 

## 2011-03-30 ENCOUNTER — Telehealth: Payer: Self-pay | Admitting: Cardiology

## 2011-03-30 NOTE — Telephone Encounter (Signed)
Pt wants to know if he can order generic  plavix online canadian?

## 2011-03-30 NOTE — Telephone Encounter (Signed)
I spoke with the pt and made him aware that it is up to the pt if he decides to get his medication from [place].  The pt currently has a 3 month supply of Plavix from [facility].  The pt has an appointment with Dr Riley Kill on 05/24/11 and I made him aware that he should discuss this issue with Dr Riley Kill at that appointment.  Pt agreed with plan.

## 2011-05-24 ENCOUNTER — Ambulatory Visit: Payer: Self-pay | Admitting: Cardiology

## 2011-06-01 ENCOUNTER — Other Ambulatory Visit: Payer: Self-pay | Admitting: Cardiology

## 2011-06-01 DIAGNOSIS — E785 Hyperlipidemia, unspecified: Secondary | ICD-10-CM

## 2011-06-01 NOTE — Telephone Encounter (Signed)
Pt needs refill on crestor 40mg  qd and plavix 75mg  qd pt needs a 90day supply called in to his assistance program

## 2011-06-02 ENCOUNTER — Ambulatory Visit: Payer: Self-pay | Admitting: Cardiology

## 2011-06-07 MED ORDER — ROSUVASTATIN CALCIUM 40 MG PO TABS: 40.0000 mg | ORAL_TABLET | Freq: Every day | ORAL | Status: DC

## 2011-06-07 MED ORDER — CLOPIDOGREL BISULFATE 75 MG PO TABS: 75.0000 mg | ORAL_TABLET | Freq: Every day | ORAL | Status: DC

## 2011-06-07 NOTE — Telephone Encounter (Signed)
Will refill patient's Plavix and Crestor. Refilled patient's Crestor through [employer].

## 2011-06-07 NOTE — Telephone Encounter (Signed)
Pt needs RX refill of crestor and plavix. Pt however would like to come and pick up written RX's. Pt said he called about a week ago regarding the same RX refill with no response back. Pt was made aware that message was sent around as a high priority.

## 2011-06-11 ENCOUNTER — Telehealth: Payer: Self-pay | Admitting: Cardiology

## 2011-06-11 NOTE — Telephone Encounter (Signed)
Pt will bring [employer] crestor assistance form to his appt on 06/16/11. Mylo Red RN

## 2011-06-11 NOTE — Telephone Encounter (Signed)
Pt calling wanting to know Dr. Rosalyn Charters state license number for a RX of crestor 40mg . Please return pt call to advise/discuss.

## 2011-06-16 ENCOUNTER — Ambulatory Visit (INDEPENDENT_AMBULATORY_CARE_PROVIDER_SITE_OTHER): Payer: Self-pay | Admitting: Cardiology

## 2011-06-16 DIAGNOSIS — I2510 Atherosclerotic heart disease of native coronary artery without angina pectoris: Secondary | ICD-10-CM

## 2011-06-16 DIAGNOSIS — Z79899 Other long term (current) drug therapy: Secondary | ICD-10-CM

## 2011-06-16 DIAGNOSIS — E785 Hyperlipidemia, unspecified: Secondary | ICD-10-CM

## 2011-06-16 DIAGNOSIS — F17200 Nicotine dependence, unspecified, uncomplicated: Secondary | ICD-10-CM

## 2011-06-16 LAB — CBC WITH DIFFERENTIAL/PLATELET
Basophils Absolute: 0.1 10*3/uL (ref 0.0–0.1)
Lymphocytes Relative: 12.2 % (ref 12.0–46.0)
Monocytes Relative: 7.5 % (ref 3.0–12.0)
Neutrophils Relative %: 76.9 % (ref 43.0–77.0)
Platelets: 219 10*3/uL (ref 150.0–400.0)
RDW: 13.4 % (ref 11.5–14.6)

## 2011-06-16 LAB — BASIC METABOLIC PANEL
BUN: 22 mg/dL (ref 6–23)
Calcium: 9.6 mg/dL (ref 8.4–10.5)
GFR: 49.68 mL/min — ABNORMAL LOW (ref >60.00)
Glucose, Bld: 112 mg/dL — ABNORMAL HIGH (ref 70–99)
Sodium: 140 mEq/L (ref 135–145)

## 2011-06-16 LAB — LIPID PANEL
Cholesterol: 154 mg/dL (ref 0–200)
HDL: 77.9 mg/dL (ref >39.00)
VLDL: 17.6 mg/dL (ref 0.0–40.0)

## 2011-06-16 NOTE — Telephone Encounter (Signed)
Number entered on form ./cy

## 2011-06-16 NOTE — Progress Notes (Signed)
HPI:  He is doing well.  Rare chest pain.  Still smoking up to a pack per day.  Cannot quit and has tried.  He feels good overall and is riding a bike regularly.   Current Outpatient Prescriptions  Medication Sig Dispense Refill  . aspirin 81 MG tablet Take 81 mg by mouth daily.        . clopidogrel (PLAVIX) 75 MG tablet Take 1 tablet (75 mg total) by mouth daily.  90 tablet  3  . FLUoxetine (PROZAC) 20 MG capsule Take 20 mg by mouth daily.        . Multiple Vitamin (MULTIVITAMIN) tablet Take 1 tablet by mouth daily.        . nitroGLYCERIN (NITROSTAT) 0.4 MG SL tablet Place 0.4 mg under the tongue every 5 (five) minutes as needed.        Marland Kitchen OLANZapine (ZYPREXA) 5 MG tablet Take 5 mg by mouth at bedtime.        . rosuvastatin (CRESTOR) 40 MG tablet Take 1 tablet (40 mg total) by mouth daily.  90 tablet  3  . triamcinolone (KENALOG) 0.025 % cream Apply topically 2 (two) times daily.          Allergies no known allergies  Past Medical History  Diagnosis Date  . Nodule of left lung     6-mm smooothly rounded nodule  . Coronary artery disease   . AMI (acute myocardial infarction)     Acute ST elevation  . Hyperlipidemia   . Syncope and collapse     in 2001 and 2004 with no clear cause  . Tobacco abuse   . Bipolar disorder     No past surgical history on file.  Family History  Problem Relation Age of Onset  . Other Mother 24    died  . Alcohol abuse Father 56    History   Social History  . Marital Status: Married    Spouse Name: N/A    Number of Children: N/A  . Years of Education: N/A   Occupational History  . Not on file.   Social History Main Topics  . Smoking status: Current Everyday Smoker    Types: Cigarettes  . Smokeless tobacco: Not on file   Comment: 1 pack a day  . Alcohol Use: Yes     heavy use of EtOH with 8-10 beers daily  . Drug Use: No     marijuana quit 2010. He has hx of THC use.  Marland Kitchen Sexually Active: Not on file   Other Topics Concern  . Not on  file   Social History Narrative  . No narrative on file    ROS: Please see the HPI.  All other systems reviewed and negative.  PHYSICAL EXAM:  BP 136/70  Pulse 71  Ht 5\' 6"  (1.676 m)  Wt 153 lb 6.4 oz (69.582 kg)  BMI 24.76 kg/m2  General: Well developed, well nourished, in no acute distress. Head:  Normocephalic and atraumatic. Neck: no JVD Lungs: Clear to auscultation and percussion. Heart: Normal S1 and S2.  No murmur, rubs or gallops.  Pulses: Pulses normal in all 4 extremities. Extremities: No clubbing or cyanosis. No edema. Neurologic: Alert and oriented x 3.  EKG:  NSR.  WNL. ASSESSMENT AND PLAN:

## 2011-06-16 NOTE — Assessment & Plan Note (Addendum)
We discussed his situation.  He continues to smoke and cannot.  He wants to get plavix from [place] secondary to [place] cost, but we reviewed his indications.  He is doing well.  He is now out two years and had a non DES.  He understands risks.  We will stop plavix at present.  I also reviewed kinetics of Plavix with smokers.  We discussed all options in detail, including DAPT, [employer], and [place] guidelines.

## 2011-06-16 NOTE — Assessment & Plan Note (Signed)
We discussed this in detail  He has trouble.  Still smoking up to a pack.

## 2011-06-16 NOTE — Assessment & Plan Note (Signed)
[facility] meds.

## 2011-06-16 NOTE — Patient Instructions (Addendum)
Your physician recommends that you schedule a follow-up appointment in: 6 MONTHS WITH DR Riley Kill  Your physician has recommended you make the following change in your medication: STOP PLAVIX  Your physician recommends that you return for lab work in: TODAY BMET CBC LIPID  HGB A1C   DX V58.69 272.4

## 2011-06-29 ENCOUNTER — Telehealth: Payer: Self-pay | Admitting: Cardiology

## 2011-06-29 NOTE — Telephone Encounter (Signed)
Pharmacy calling verbal authorization that it is ok to release.   Pharmacy also needs to know if RX should be filled and sent to pt. Crestor 40mg , quantity of 90 with 3 refills. Written on August 20th.

## 2011-06-30 NOTE — Telephone Encounter (Signed)
I spoke with Clydie Braun at the pharmacy. She states a copy of a Crestor prescription was received with what looks like a stamped signature from 06/07/11. They need a verbal order for this. I reviewed the patient's chart. At his recent visit per Dr. Riley Kill, he was [facility] for lipids on Crestor 40mg  once daily. I have given the verbal ok to refill this prescription #90 w/ 3 refills.

## 2011-06-30 NOTE — Telephone Encounter (Signed)
LMTC

## 2011-12-13 ENCOUNTER — Ambulatory Visit (INDEPENDENT_AMBULATORY_CARE_PROVIDER_SITE_OTHER): Payer: Self-pay | Admitting: Cardiology

## 2011-12-13 ENCOUNTER — Encounter: Payer: Self-pay | Admitting: Cardiology

## 2011-12-13 ENCOUNTER — Other Ambulatory Visit: Payer: Self-pay | Admitting: Cardiology

## 2011-12-13 VITALS — BP 130/78 | HR 65 | Ht 66.0 in | Wt 158.8 lb

## 2011-12-13 DIAGNOSIS — I2510 Atherosclerotic heart disease of native coronary artery without angina pectoris: Secondary | ICD-10-CM

## 2011-12-13 DIAGNOSIS — E785 Hyperlipidemia, unspecified: Secondary | ICD-10-CM

## 2011-12-13 NOTE — Telephone Encounter (Signed)
Nitrostat refill needed at  [facility], pt requesting call when done [phone]

## 2011-12-13 NOTE — Assessment & Plan Note (Signed)
Has been unable to stop smoking.  We have discussed this in detail, including the amount that he smokes.

## 2011-12-13 NOTE — Assessment & Plan Note (Addendum)
He does have some chest pain.  It is often atypical.  It is not exertional.  At present, we will manage conservatively.  Since he thinks this could be indigestion, and antacids seem to help, he plans to try a short trial of prilosec, and let us know how it goes.  None of his symptoms are exertional in nature.  I will see him back in three months. There was a negative nuclear scan one year ago, so will not repeat at this time.

## 2011-12-13 NOTE — Assessment & Plan Note (Addendum)
Not far off of [facility] at present.  Will continue current meds at current dose.  Borderline glucose elevation which I asked him to follow up with Dr. Tawanna Cooler.

## 2011-12-13 NOTE — Patient Instructions (Signed)
Your physician recommends that you continue on your current medications as directed. Please refer to the Current Medication list given to you today.  Your physician recommends that you schedule a follow-up appointment in: 3 MONTHS--please come into the office fasting--nothing to eat after midnight

## 2011-12-13 NOTE — Progress Notes (Signed)
HPI:  He is stable.  Unfortunately, he does continue to smoke.  Denies any exertional symptoms, and he is able to ride a mountain bike  without much difficulty. There are no exertional symptoms. When he does feel this discomfort, it feels like a bruise and possibly some itching over left upper chest. It is described as a 1/10. He has not used nitroglycerin. His wife raised the issue that this could represent indigestion. When he has taken antiacids, it seems to have helped.  Denies any diaphoresis, but has noticed perhaps mild shortness of breath. He also sees a counselor about every 90 days, and this is used for his medications.  Current Outpatient Prescriptions  Medication Sig Dispense Refill  . aspirin 81 MG tablet Take 81 mg by mouth daily.        Marland Kitchen FLUoxetine (PROZAC) 20 MG capsule Take 20 mg by mouth daily.        . Multiple Vitamin (MULTIVITAMIN) tablet Take 1 tablet by mouth daily.        . nitroGLYCERIN (NITROSTAT) 0.4 MG SL tablet Place 0.4 mg under the tongue every 5 (five) minutes as needed.        Marland Kitchen OLANZapine (ZYPREXA) 5 MG tablet Take 5 mg by mouth at bedtime.        . rosuvastatin (CRESTOR) 40 MG tablet Take 1 tablet (40 mg total) by mouth daily.  90 tablet  3  . triamcinolone (KENALOG) 0.025 % cream Apply topically 2 (two) times daily.          No Known Allergies  Past Medical History  Diagnosis Date  . Nodule of left lung     6-mm smooothly rounded nodule  . Coronary artery disease   . AMI (acute myocardial infarction)     Acute ST elevation  . Hyperlipidemia   . Syncope and collapse     in 2001 and 2004 with no clear cause  . Tobacco abuse   . Bipolar disorder     No past surgical history on file.  Family History  Problem Relation Age of Onset  . Other Mother 66    died  . Alcohol abuse Father 15    History   Social History  . Marital Status: Married    Spouse Name: N/A    Number of Children: N/A  . Years of Education: N/A   Occupational History    . Not on file.   Social History Main Topics  . Smoking status: Current Everyday Smoker    Types: Cigarettes  . Smokeless tobacco: Not on file   Comment: 1 pack a day  . Alcohol Use: Yes     heavy use of EtOH with 8-10 beers daily  . Drug Use: No     marijuana quit 2010. He has hx of THC use.  Marland Kitchen Sexually Active: Not on file   Other Topics Concern  . Not on file   Social History Narrative  . No narrative on file    ROS: Please see the HPI.  All other systems reviewed and negative.  PHYSICAL EXAM:  BP 130/78  Pulse 65  Ht 5\' 6"  (1.676 m)  Wt 158 lb 12.8 oz (72.031 kg)  BMI 25.63 kg/m2  General: Well developed, well nourished, in no acute distress. Head:  Normocephalic and atraumatic. Neck: no JVD Lungs: Clear to auscultation and percussion. Heart: Normal S1 and S2.  No murmur, rubs or gallops.  Abdomen:  Normal bowel sounds; soft; non tender; no organomegaly  Pulses: Pulses normal in all 4 extremities. Extremities: No clubbing or cyanosis. No edema. Neurologic: Alert and oriented x 3.  EKG:  NSR.  WNL.   ASSESSMENT AND PLAN:

## 2011-12-14 MED ORDER — NITROGLYCERIN 0.4 MG SL SUBL: 0.4000 mg | SUBLINGUAL_TABLET | SUBLINGUAL | Status: DC | PRN

## 2011-12-14 NOTE — Telephone Encounter (Signed)
FU Call: Pt calling to check on status of pt nitro refill. Please call RX in asap and call pt to inform when complete.

## 2012-03-15 ENCOUNTER — Encounter: Payer: Self-pay | Admitting: Cardiology

## 2012-03-15 ENCOUNTER — Ambulatory Visit (INDEPENDENT_AMBULATORY_CARE_PROVIDER_SITE_OTHER): Payer: Self-pay | Admitting: Cardiology

## 2012-03-15 VITALS — BP 152/92 | HR 69 | Ht 66.0 in | Wt 154.0 lb

## 2012-03-15 DIAGNOSIS — E785 Hyperlipidemia, unspecified: Secondary | ICD-10-CM

## 2012-03-15 DIAGNOSIS — F17200 Nicotine dependence, unspecified, uncomplicated: Secondary | ICD-10-CM

## 2012-03-15 DIAGNOSIS — R059 Cough, unspecified: Secondary | ICD-10-CM

## 2012-03-15 DIAGNOSIS — I2510 Atherosclerotic heart disease of native coronary artery without angina pectoris: Secondary | ICD-10-CM

## 2012-03-15 DIAGNOSIS — R05 Cough: Secondary | ICD-10-CM

## 2012-03-15 DIAGNOSIS — R0602 Shortness of breath: Secondary | ICD-10-CM

## 2012-03-15 MED ORDER — METOPROLOL SUCCINATE ER 25 MG PO TB24: 25.0000 mg | ORAL_TABLET | Freq: Every day | ORAL | Status: DC

## 2012-03-15 NOTE — Assessment & Plan Note (Signed)
The patient has some atypical symptoms. He underwent myocardial perfusion imaging one year ago which did not demonstrate ischemia. We will do a standard exercise test which will be arranged for within the next week. We will add low-dose metoprolol to his regimen. For the shortness of breath, we will obtain pulmonary function testing. He is slightly tender on exam I feel no mass in the left breast. At some point he may need consider having a mammogram. I will see him back in 2 weeks, and we will reassess his status at that time. Should he have any progression in symptoms he knows to call us promptly. The patient is not inclined to have a catheterization at the present time, and I concur with his feelings. We will monitor him closely I will see him back in followup in 2 weeks. We have encouraged him to stop smoking, but this is been a challenge for him.

## 2012-03-15 NOTE — Progress Notes (Signed)
HPI:  Patient returns in followup. He's had some intermittent shortness of breath. At times it lasts for hours. Typically occurs in the morning he denies any heaviness in the chest. He's also had some discomfort in the chest, and he describes this as pinching, stabbing, and left inframammary in location. It is also tender to the touch. He continues to smoke about one pack of cigarettes per day he has a small cough. He denies any exertional pressure whatsoever but says that a stress test or time on the treadmill would likely cause a little bit of wheezing. He gets no regular exercise at all, and would describe himself as sedentary.  Current Outpatient Prescriptions  Medication Sig Dispense Refill  . aspirin 81 MG tablet Take 81 mg by mouth daily.        Marland Kitchen FLUoxetine (PROZAC) 20 MG capsule Take 20 mg by mouth daily.        . Multiple Vitamin (MULTIVITAMIN) tablet Take 1 tablet by mouth daily.        . nitroGLYCERIN (NITROSTAT) 0.4 MG SL tablet Place 1 tablet (0.4 mg total) under the tongue every 5 (five) minutes as needed.  25 tablet  6  . OLANZapine (ZYPREXA) 5 MG tablet Take 5 mg by mouth at bedtime.        . rosuvastatin (CRESTOR) 40 MG tablet Take 1 tablet (40 mg total) by mouth daily.  90 tablet  3  . triamcinolone (KENALOG) 0.025 % cream Apply topically 2 (two) times daily.          No Known Allergies  Past Medical History  Diagnosis Date  . Nodule of left lung     6-mm smooothly rounded nodule  . Coronary artery disease   . AMI (acute myocardial infarction)     Acute ST elevation  . Hyperlipidemia   . Syncope and collapse     in 2001 and 2004 with no clear cause  . Tobacco abuse   . Bipolar disorder     No past surgical history on file.  Family History  Problem Relation Age of Onset  . Other Mother 23    died  . Alcohol abuse Father 26    History   Social History  . Marital Status: Married    Spouse Name: N/A    Number of Children: N/A  . Years of Education: N/A     Occupational History  . Not on file.   Social History Main Topics  . Smoking status: Current Everyday Smoker    Types: Cigarettes  . Smokeless tobacco: Not on file   Comment: 1 pack a day  . Alcohol Use: Yes     heavy use of EtOH with 8-10 beers daily  . Drug Use: No     marijuana quit 2010. He has hx of THC use.  Marland Kitchen Sexually Active: Not on file   Other Topics Concern  . Not on file   Social History Narrative  . No narrative on file    ROS: Please see the HPI.  All other systems reviewed and negative.  PHYSICAL EXAM:  BP 152/92  Pulse 69  General: Well developed, well nourished, in no acute distress. Head:  Normocephalic and atraumatic. Neck: no JVD Lungs: some prolonged expiration without wheezes Heart: Normal S1 and S2.  No murmur, rubs or gallops.  Abdomen:  Normal bowel sounds; soft; non tender; no organomegaly Pulses: Pulses normal in all 4 extremities. Extremities: No clubbing or cyanosis. No edema. Neurologic: Alert and  oriented x 3.  EKG:  NSR.  Left axis deviation.  No acute changes.    ASSESSMENT AND PLAN:

## 2012-03-15 NOTE — Patient Instructions (Addendum)
Your physician has requested that you have an exercise tolerance test. For further information please visit [email or web address] Please also follow instruction sheet, as given.  Your physician has recommended you make the following change in your medication: START Metoprolol Succinate 25mg  take one by mouth daily  Your physician recommends that you schedule a follow-up appointment in: 2 WEEKS with Dr Riley Kill  Your physician has recommended that you have a pulmonary function test. Pulmonary Function Tests are a group of tests that measure how well air moves in and out of your lungs.

## 2012-03-15 NOTE — Assessment & Plan Note (Signed)
Discussed in some detail. He understands the variables associated with discontinuation of smoking.

## 2012-03-15 NOTE — Assessment & Plan Note (Signed)
The patient was at target.

## 2012-03-20 ENCOUNTER — Ambulatory Visit (INDEPENDENT_AMBULATORY_CARE_PROVIDER_SITE_OTHER): Payer: Self-pay | Admitting: Nurse Practitioner

## 2012-03-20 ENCOUNTER — Encounter: Payer: Self-pay | Admitting: Cardiology

## 2012-03-20 DIAGNOSIS — R059 Cough, unspecified: Secondary | ICD-10-CM

## 2012-03-20 DIAGNOSIS — R0602 Shortness of breath: Secondary | ICD-10-CM

## 2012-03-20 DIAGNOSIS — I2510 Atherosclerotic heart disease of native coronary artery without angina pectoris: Secondary | ICD-10-CM

## 2012-03-20 DIAGNOSIS — R05 Cough: Secondary | ICD-10-CM

## 2012-03-20 NOTE — Procedures (Signed)
Exercise Treadmill Test  Pre-Exercise Testing Evaluation Rhythm: normal sinus  Rate: 64   PR:  .16 QRS:  .09  QT:  .41 QTc: .42     Test  Exercise Tolerance Test Ordering MD: Shawnie Pons, MD  Interpreting MD: Ward Givens NP  Unique Test No: 1  Treadmill:  1  Indication for ETT: chest pain - rule out ischemia  Contraindication to ETT: No   Stress Modality: exercise - treadmill  Cardiac Imaging Performed: non   Protocol: standard Bruce - maximal  Max BP:  214/79  Max MPHR (bpm):  158 85% MPR (bpm):  134  MPHR obtained (bpm):  144 % MPHR obtained:  92%  Reached 85% MPHR (min:sec):  7:16 Total Exercise Time (min-sec):  7:38  Workload in METS:  9.5 Borg Scale: 15  Reason ETT Terminated:  dyspnea    ST Segment Analysis At Rest: normal ST segments - no evidence of significant ST depression With Exercise: no evidence of significant ST depression  Other Information Arrhythmia:  No Angina during ETT:  absent (0) Quality of ETT:  diagnostic  ETT Interpretation:  normal - no evidence of ischemia by ST analysis  Comments: No acute st/t changes.  Hypertensive response to exercise (on bb @ home, which he did not take this AM in preparation for test).  + DOE.  No chest pain.  Recommendations: F/U with Dr. Riley Kill as scheduled on 6/19.  Encouraged to stop smoking (currently cutting back).

## 2012-03-29 ENCOUNTER — Ambulatory Visit (INDEPENDENT_AMBULATORY_CARE_PROVIDER_SITE_OTHER): Payer: Self-pay | Admitting: Internal Medicine

## 2012-03-29 DIAGNOSIS — R0602 Shortness of breath: Secondary | ICD-10-CM

## 2012-03-29 LAB — PULMONARY FUNCTION TEST

## 2012-03-29 NOTE — Progress Notes (Signed)
PFT done today. 

## 2012-04-05 ENCOUNTER — Encounter: Payer: Self-pay | Admitting: Cardiology

## 2012-04-05 ENCOUNTER — Ambulatory Visit (INDEPENDENT_AMBULATORY_CARE_PROVIDER_SITE_OTHER): Payer: Self-pay | Admitting: Cardiology

## 2012-04-05 ENCOUNTER — Other Ambulatory Visit: Payer: Self-pay

## 2012-04-05 VITALS — BP 140/84 | HR 60 | Ht 66.0 in | Wt 156.0 lb

## 2012-04-05 DIAGNOSIS — F17200 Nicotine dependence, unspecified, uncomplicated: Secondary | ICD-10-CM

## 2012-04-05 DIAGNOSIS — E785 Hyperlipidemia, unspecified: Secondary | ICD-10-CM

## 2012-04-05 DIAGNOSIS — I2510 Atherosclerotic heart disease of native coronary artery without angina pectoris: Secondary | ICD-10-CM

## 2012-04-05 LAB — LIPID PANEL
Cholesterol: 155 mg/dL (ref 0–200)
HDL: 74.8 mg/dL (ref >39.00)
VLDL: 21.4 mg/dL (ref 0.0–40.0)

## 2012-04-05 LAB — HEPATIC FUNCTION PANEL
ALT: 45 U/L (ref 0–53)
Bilirubin, Direct: 0.2 mg/dL (ref 0.0–0.3)
Total Protein: 7 g/dL (ref 6.0–8.3)

## 2012-04-05 NOTE — Assessment & Plan Note (Signed)
Consistent reviewed the need to stop smoking. He understands this. He's had a difficult time. We previously discussed all the principles involved. Because of his bipolar disorder, he's had a very difficult time of years.

## 2012-04-05 NOTE — Assessment & Plan Note (Signed)
Will need lipid and liver in the next two months.

## 2012-04-05 NOTE — Assessment & Plan Note (Signed)
He remained stable at the present time. Continued medical approach be recommended.

## 2012-04-05 NOTE — Progress Notes (Signed)
HPI:  Patient is in for follow up. Overall he is doing pretty well. He underwent exercise testing. He went over 7 minutes on the treadmill and did not have chest pain or significant ST depression. He says he is now a little tender to the touch. His blood pressures about a little borderline, and we've encouraged him to keep checking these. Overall, he is stabilized. We reviewed in detail today his pulmonary function tests are consistent with mild obstructive airways disease and a mildly reduced diffusion capacity. The results of this were reviewed with the patient in detail.  Current Outpatient Prescriptions  Medication Sig Dispense Refill  . aspirin 81 MG tablet Take 81 mg by mouth daily.        Marland Kitchen FLUoxetine (PROZAC) 20 MG capsule Take 20 mg by mouth daily.        . metoprolol succinate (TOPROL-XL) 25 MG 24 hr tablet Take 1 tablet (25 mg total) by mouth daily.  30 tablet  6  . Multiple Vitamin (MULTIVITAMIN) tablet Take 1 tablet by mouth daily.        . nitroGLYCERIN (NITROSTAT) 0.4 MG SL tablet Place 1 tablet (0.4 mg total) under the tongue every 5 (five) minutes as needed.  25 tablet  6  . OLANZapine (ZYPREXA) 5 MG tablet Take 5 mg by mouth at bedtime.        . rosuvastatin (CRESTOR) 40 MG tablet Take 1 tablet (40 mg total) by mouth daily.  90 tablet  3  . triamcinolone (KENALOG) 0.025 % cream Apply topically 2 (two) times daily.          No Known Allergies  Past Medical History  Diagnosis Date  . Nodule of left lung     6-mm smooothly rounded nodule  . Coronary artery disease   . AMI (acute myocardial infarction)     Acute ST elevation  . Hyperlipidemia   . Syncope and collapse     in 2001 and 2004 with no clear cause  . Tobacco abuse   . Bipolar disorder     No past surgical history on file.  Family History  Problem Relation Age of Onset  . Other Mother 109    died  . Alcohol abuse Father 38    History   Social History  . Marital Status: Married    Spouse Name: N/A      Number of Children: N/A  . Years of Education: N/A   Occupational History  . Not on file.   Social History Main Topics  . Smoking status: Current Everyday Smoker    Types: Cigarettes  . Smokeless tobacco: Not on file   Comment: 1 pack a day  . Alcohol Use: Yes     heavy use of EtOH with 8-10 beers daily  . Drug Use: No     marijuana quit 2010. He has hx of THC use.  Marland Kitchen Sexually Active: Not on file   Other Topics Concern  . Not on file   Social History Narrative  . No narrative on file    ROS: Please see the HPI.  All other systems reviewed and negative.  PHYSICAL EXAM:  BP 140/84  Pulse 60  Ht 5\' 6"  (1.676 m)  Wt 156 lb (70.761 kg)  BMI 25.18 kg/m2  General: Well developed, well nourished, in no acute distress. Head:  Normocephalic and atraumatic. Neck: no JVD Lungs: mildly prolonged expiration.  Heart: Normal S1 and S2.  No murmur, rubs or gallops.  Abdomen:  Normal bowel sounds; soft; non tender; no organomegaly Pulses: Pulses normal in all 4 extremities. Extremities: No clubbing or cyanosis. No edema. Neurologic: Alert and oriented x 3.  EKG:  ASSESSMENT AND PLAN:

## 2012-04-05 NOTE — Patient Instructions (Addendum)
Your physician recommends that you schedule a follow-up appointment in: 3 MONTHS with Dr Riley Kill  Your physician has requested that you regularly monitor and record your blood pressure readings at home. Please use the same machine at the same time of day to check your readings and record them to bring to your follow-up visit.  Please call the office if your BP remains elevated.   Your physician recommends that you continue on your current medications as directed. Please refer to the Current Medication list given to you today.  Your physician recommends that you have a lipid and liver profile today.

## 2012-04-07 ENCOUNTER — Encounter: Payer: Self-pay | Admitting: Cardiology

## 2012-04-25 ENCOUNTER — Telehealth: Payer: Self-pay | Admitting: Cardiology

## 2012-04-25 NOTE — Telephone Encounter (Signed)
I spoke with the pt and made him aware that I have already [place] Crestor.  This was faxed to the company on 04/19/12. I also made the pt aware of his recent lipid and liver results.

## 2012-04-25 NOTE — Telephone Encounter (Addendum)
Pt calling re needing a form completed for the pt's assitance program will bring it by today, wanted to let you know, and wants blood test results called to him

## 2012-05-04 ENCOUNTER — Encounter: Payer: Self-pay | Admitting: Cardiology

## 2012-05-04 ENCOUNTER — Telehealth: Payer: Self-pay | Admitting: Family Medicine

## 2012-05-04 DIAGNOSIS — N644 Mastodynia: Secondary | ICD-10-CM

## 2012-05-04 NOTE — Telephone Encounter (Signed)
This encounter was created in error - please disregard.

## 2012-05-04 NOTE — Telephone Encounter (Signed)
Pt requesting a referral for a mammogram. Per cardiologist pt is having pains in left breast and cardiologist feels it could be breast cancer.

## 2012-05-04 NOTE — Telephone Encounter (Signed)
New msg Pt needs a referral for mammogram. He already contacted his pcp. Please call

## 2012-05-04 NOTE — Telephone Encounter (Signed)
ok 

## 2012-05-11 ENCOUNTER — Ambulatory Visit
Admission: RE | Admit: 2012-05-11 | Discharge: 2012-05-11 | Disposition: A | Discharge status: Home / Self Care | Payer: Self-pay | Source: Ambulatory Visit | Attending: Family Medicine | Admitting: Family Medicine

## 2012-05-11 ENCOUNTER — Other Ambulatory Visit: Payer: Self-pay | Admitting: Family Medicine

## 2012-05-11 DIAGNOSIS — N644 Mastodynia: Secondary | ICD-10-CM

## 2012-07-06 ENCOUNTER — Ambulatory Visit: Payer: Self-pay | Admitting: Cardiology

## 2012-07-19 ENCOUNTER — Ambulatory Visit: Payer: Self-pay | Admitting: Cardiology

## 2012-07-26 ENCOUNTER — Encounter: Payer: Self-pay | Admitting: Cardiology

## 2012-07-26 ENCOUNTER — Ambulatory Visit (INDEPENDENT_AMBULATORY_CARE_PROVIDER_SITE_OTHER): Payer: Self-pay | Admitting: Cardiology

## 2012-07-26 VITALS — BP 148/70 | HR 73 | Ht 66.0 in | Wt 160.8 lb

## 2012-07-26 DIAGNOSIS — J984 Other disorders of lung: Secondary | ICD-10-CM

## 2012-07-26 DIAGNOSIS — I2510 Atherosclerotic heart disease of native coronary artery without angina pectoris: Secondary | ICD-10-CM

## 2012-07-26 DIAGNOSIS — F17200 Nicotine dependence, unspecified, uncomplicated: Secondary | ICD-10-CM

## 2012-07-26 DIAGNOSIS — E785 Hyperlipidemia, unspecified: Secondary | ICD-10-CM

## 2012-07-26 NOTE — Patient Instructions (Signed)
Your physician recommends that you schedule a follow-up appointment in: MARCH 2014  Your physician recommends that you continue on your current medications as directed. Please refer to the Current Medication list given to you today.  

## 2012-07-26 NOTE — Progress Notes (Signed)
HPI:  The patient is in for followup. From a cardiac standpoint he is been stable. He has perhaps the need for one to 2 nitroglycerin a month. He does continue to smoke. He has had some mild bilateral gynecomastia does have mammogram which is negative. Questions come up whether some of his medicines could be causing relative cause of this. We went over this in some detail. I did encourage him see Dr. Tawanna Cooler and also consider having a testosterone level measured. He has no exertional chest pain he does have the discomfort, as I mentioned, is rare, and seems to occur at night after his meal. It is not associated with exertion. Nitroglycerin takes care of her rather easily.  Current Outpatient Prescriptions  Medication Sig Dispense Refill  . aspirin 81 MG tablet Take 81 mg by mouth daily.        Marland Kitchen FLUoxetine (PROZAC) 20 MG capsule Take 20 mg by mouth daily.        . metoprolol succinate (TOPROL-XL) 25 MG 24 hr tablet Take 1 tablet (25 mg total) by mouth daily.  30 tablet  6  . Multiple Vitamin (MULTIVITAMIN) tablet Take 1 tablet by mouth daily.        . nitroGLYCERIN (NITROSTAT) 0.4 MG SL tablet Place 1 tablet (0.4 mg total) under the tongue every 5 (five) minutes as needed.  25 tablet  6  . OLANZapine (ZYPREXA) 5 MG tablet Take 5 mg by mouth at bedtime.        . rosuvastatin (CRESTOR) 40 MG tablet Take 1 tablet (40 mg total) by mouth daily.  90 tablet  3  . triamcinolone (KENALOG) 0.025 % cream Apply topically 2 (two) times daily.          No Known Allergies  Past Medical History  Diagnosis Date  . Nodule of left lung     6-mm smooothly rounded nodule  . Coronary artery disease   . AMI (acute myocardial infarction)     Acute ST elevation  . Hyperlipidemia   . Syncope and collapse     in 2001 and 2004 with no clear cause  . Tobacco abuse   . Bipolar disorder     No past surgical history on file.  Family History  Problem Relation Age of Onset  . Other Mother 21    died  . Alcohol  abuse Father 13    History   Social History  . Marital Status: Married    Spouse Name: N/A    Number of Children: N/A  . Years of Education: N/A   Occupational History  . Not on file.   Social History Main Topics  . Smoking status: Current Every Day Smoker    Types: Cigarettes  . Smokeless tobacco: Not on file   Comment: 1 pack a day  . Alcohol Use: Yes     heavy use of EtOH with 8-10 beers daily  . Drug Use: No     marijuana quit 2010. He has hx of THC use.  Marland Kitchen Sexually Active: Not on file   Other Topics Concern  . Not on file   Social History Narrative  . No narrative on file    ROS: Please see the HPI.  All other systems reviewed and negative.  PHYSICAL EXAM:  BP 148/70  Pulse 73  Ht 5\' 6"  (1.676 m)  Wt 160 lb 12.8 oz (72.938 kg)  BMI 25.95 kg/m2  General: Well developed, well nourished, in no acute distress. Head:  Normocephalic and atraumatic. Neck: no JVD Chest:  Mild bilateral gynecomastia.   Lungs: Clear to auscultation and percussion. Heart: Normal S1 and S2.  No murmur, rubs or gallops.  Abdomen:  Normal bowel sounds; soft; non tender; no organomegaly Pulses: Pulses normal in all 4 extremities. Extremities: No clubbing or cyanosis. No edema. Neurologic: Alert and oriented x 3.  EKG:  NSR.  Nonspecific ST abnormality.   ASSESSMENT AND PLAN:

## 2012-08-12 NOTE — Assessment & Plan Note (Signed)
At target with a borderline transaminase.

## 2012-08-12 NOTE — Assessment & Plan Note (Signed)
Unable to quit.  Multiple conversations.

## 2012-08-12 NOTE — Assessment & Plan Note (Signed)
Continue medical therapy.  Continue encouragement with regard to stopping smoking.  This is of highest importance, but is impeded by many barriers.

## 2012-08-12 NOTE — Assessment & Plan Note (Signed)
See overview.   

## 2012-10-23 ENCOUNTER — Ambulatory Visit (INDEPENDENT_AMBULATORY_CARE_PROVIDER_SITE_OTHER): Payer: Self-pay | Admitting: Family Medicine

## 2012-10-23 ENCOUNTER — Encounter: Payer: Self-pay | Admitting: Family Medicine

## 2012-10-23 VITALS — BP 140/98 | Temp 99.0°F | Wt 160.0 lb

## 2012-10-23 DIAGNOSIS — J069 Acute upper respiratory infection, unspecified: Secondary | ICD-10-CM

## 2012-10-23 DIAGNOSIS — J441 Chronic obstructive pulmonary disease with (acute) exacerbation: Secondary | ICD-10-CM

## 2012-10-23 DIAGNOSIS — J449 Chronic obstructive pulmonary disease, unspecified: Secondary | ICD-10-CM | POA: Insufficient documentation

## 2012-10-23 DIAGNOSIS — F17200 Nicotine dependence, unspecified, uncomplicated: Secondary | ICD-10-CM

## 2012-10-23 MED ORDER — PREDNISONE 20 MG PO TABS: ORAL_TABLET | ORAL | Status: DC

## 2012-10-23 MED ORDER — HYDROCODONE-HOMATROPINE 5-1.5 MG/5ML PO SYRP: 5.0000 mL | ORAL_SOLUTION | Freq: Three times a day (TID) | ORAL | Status: DC | PRN

## 2012-10-23 MED ORDER — VARENICLINE TARTRATE 1 MG PO TABS: ORAL_TABLET | ORAL | Status: DC

## 2012-10-23 NOTE — Patient Instructions (Addendum)
Stop smoking completely  Chantix one half tab every morning  Hydromet 1/2-1 teaspoon 3 times daily when necessary for cough  Drink lots of water  Prednisone as directed  Followup in 3 days sooner if any problems

## 2012-10-23 NOTE — Progress Notes (Signed)
  Subjective:    Patient ID: Jeffrey Hudson, male    DOB: 07/31/1951, 62 y.o.   MRN: [id number]  HPI  Randie is a 62 year old married male smoker one pack of cigarettes a day who has underlying coronary artery disease who has had an MI with angioplasty who comes in today with a week's history of a cold  He has had congestion sore throat cough. No fever no sputum production.  He smokes a pack of cigarettes a day but says he is down to one a day with this cold. We talked about various programs for smoking cessation. He states his psychiatrist told him not to take Chantix because acute inferior with his medications that he takes for bipolar depression. However I think that the odds of that happening versus the continued negative effects of smoking a pack of cigarettes a day outweigh trying any taking any medication we will therefore use the low-dose of the Chantix     Review of Systems    general and pulmonary review of systems otherwise negative Objective:   Physical Exam  What of a pulmonary stent male no acute distress smells of tobacco HEENT negative neck was supple no adenopathy lungs shows decreased breath sounds consistent with chronic tobacco abuse and underlying COPD inspiratory and expiratory mild wheezing      Assessment & Plan:  Viral syndrome with secondary asthma plan......Marland Kitchen DC smoking completely, Chantix 1 mg dose one half tab every morning  Drink lots of liquids, Hydromet when necessary for cough, begin the inhaled steroid, followup in 3 days

## 2012-10-26 ENCOUNTER — Ambulatory Visit (INDEPENDENT_AMBULATORY_CARE_PROVIDER_SITE_OTHER): Payer: Self-pay | Admitting: Family Medicine

## 2012-10-26 ENCOUNTER — Encounter: Payer: Self-pay | Admitting: Family Medicine

## 2012-10-26 VITALS — BP 140/90 | Temp 98.6°F | Wt 163.0 lb

## 2012-10-26 DIAGNOSIS — F17200 Nicotine dependence, unspecified, uncomplicated: Secondary | ICD-10-CM

## 2012-10-26 DIAGNOSIS — J45901 Unspecified asthma with (acute) exacerbation: Secondary | ICD-10-CM

## 2012-10-26 DIAGNOSIS — J441 Chronic obstructive pulmonary disease with (acute) exacerbation: Secondary | ICD-10-CM

## 2012-10-26 NOTE — Progress Notes (Signed)
  Subjective:    Patient ID: Jeffrey Hudson, male    DOB: [birth year], 62 y.o.   MRN: [id number]  HPI Jeffrey Hudson is a 62 year old male married smoker,,,,,,,,, down to 10 cigarettes daily from 20 on the Chantix one half tab every morning,,,,,,,,,, who comes in today for followup of asthma  We started him on 40 mg of prednisone daily for 3 days he stands 20 mg today and feels about 50% better. No side effects from the Chantix he stating one half tab daily   Review of Systems    general and pulmonary review of systems otherwise negative Objective:   Physical Exam  Well-developed well-nourished male smells of tobacco no acute distress lungs show inspiratory and expiratory wheezing however markedly diminished from last Monday      Assessment & Plan:  Asthma secondary to viral syndrome with underlying COPD and tobacco abuse plan continue Chantix one half tab daily taper cigarettes by 2 per week, prednisone as directed  Return in 3 weeks for followup

## 2012-10-26 NOTE — Patient Instructions (Signed)
Continue the prednisone as you're currently doing however take a half a tablet Monday Wednesday Friday until I see you in 4 weeks  Taper the cigarettes by 2 per week               8,,,,,,,6,,,,,  Continue the Chantix one half tab daily in the morning

## 2012-11-13 ENCOUNTER — Other Ambulatory Visit: Payer: Self-pay | Admitting: Cardiology

## 2012-11-13 NOTE — Telephone Encounter (Signed)
New Problem:     Patient called in needing a new year prescription of refills for his his rosuvastatin (CRESTOR) 40 MG tablet mailed to him.

## 2012-11-16 ENCOUNTER — Ambulatory Visit: Payer: Self-pay | Admitting: Family Medicine

## 2012-11-16 MED ORDER — ROSUVASTATIN CALCIUM 40 MG PO TABS: 40.0000 mg | ORAL_TABLET | Freq: Every day | ORAL | Status: DC

## 2012-11-16 NOTE — Telephone Encounter (Signed)
Rx signed by Dr Excell Seltzer since Dr Riley Kill is out of the office this week.  Rx mailed to the pt's home per his request.

## 2012-12-18 ENCOUNTER — Encounter: Payer: Self-pay | Admitting: Cardiology

## 2012-12-18 ENCOUNTER — Ambulatory Visit (INDEPENDENT_AMBULATORY_CARE_PROVIDER_SITE_OTHER): Payer: Self-pay | Admitting: Cardiology

## 2012-12-18 VITALS — BP 126/84 | HR 80 | Ht 66.0 in | Wt 159.0 lb

## 2012-12-18 DIAGNOSIS — F319 Bipolar disorder, unspecified: Secondary | ICD-10-CM

## 2012-12-18 DIAGNOSIS — J441 Chronic obstructive pulmonary disease with (acute) exacerbation: Secondary | ICD-10-CM

## 2012-12-18 DIAGNOSIS — I2510 Atherosclerotic heart disease of native coronary artery without angina pectoris: Secondary | ICD-10-CM

## 2012-12-18 DIAGNOSIS — E785 Hyperlipidemia, unspecified: Secondary | ICD-10-CM

## 2012-12-18 NOTE — Progress Notes (Signed)
HPI:  He is doing quite well.  We had a nice discussion today.  No chest pain.  Still smoking a little after a failed attempt at Chantix, which he did not like.  No new symptoms.    Current Outpatient Prescriptions  Medication Sig Dispense Refill  . aspirin 81 MG tablet Take 81 mg by mouth daily.        Marland Kitchen FLUoxetine (PROZAC) 20 MG capsule Take 20 mg by mouth daily.        Marland Kitchen HYDROcodone-homatropine (HYCODAN) 5-1.5 MG/5ML syrup Take 5 mLs by mouth every 8 (eight) hours as needed for cough.  120 mL  1  . metoprolol succinate (TOPROL-XL) 25 MG 24 hr tablet Take 1 tablet (25 mg total) by mouth daily.  30 tablet  6  . Multiple Vitamin (MULTIVITAMIN) tablet Take 1 tablet by mouth daily.        . nitroGLYCERIN (NITROSTAT) 0.4 MG SL tablet Place 1 tablet (0.4 mg total) under the tongue every 5 (five) minutes as needed.  25 tablet  6  . OLANZapine (ZYPREXA) 5 MG tablet Take 5 mg by mouth at bedtime.        . predniSONE (DELTASONE) 20 MG tablet 2 tabs x3 days, 1 tab x3 days, a half a tab x3 days, then a half a tablet Monday Wednesday Friday for a two-week taper  30 tablet  1  . rosuvastatin (CRESTOR) 40 MG tablet Take 1 tablet (40 mg total) by mouth daily.  90 tablet  3  . triamcinolone (KENALOG) 0.025 % cream Apply topically 2 (two) times daily.        . varenicline (CHANTIX CONTINUING MONTH PAK) 1 MG tablet One half tab every morning  30 tablet  3   No current facility-administered medications for this visit.    No Known Allergies  Past Medical History  Diagnosis Date  . Nodule of left lung     6-mm smooothly rounded nodule  . Coronary artery disease   . AMI (acute myocardial infarction)     Acute ST elevation  . Hyperlipidemia   . Syncope and collapse     in 2001 and 2004 with no clear cause  . Tobacco abuse   . Bipolar disorder     No past surgical history on file.  Family History  Problem Relation Age of Onset  . Other Mother 51    died  . Alcohol abuse Father 68    History     Social History  . Marital Status: Married    Spouse Name: N/A    Number of Children: N/A  . Years of Education: N/A   Occupational History  . Not on file.   Social History Main Topics  . Smoking status: Current Every Day Smoker    Types: Cigarettes  . Smokeless tobacco: Not on file     Comment: 1 pack a day  . Alcohol Use: Yes     Comment: heavy use of EtOH with 8-10 beers daily  . Drug Use: No     Comment: marijuana quit 2010. He has hx of THC use.  Marland Kitchen Sexually Active: Not on file   Other Topics Concern  . Not on file   Social History Narrative  . No narrative on file    ROS: Please see the HPI.  All other systems reviewed and negative.  PHYSICAL E  126/84.  80  96%  General: Well developed, well nourished, in no acute distress. Head:  Normocephalic and  atraumatic. Neck: no JVD Lungs: Clear to auscultation and percussion. Heart: Normal S1 and S2.  No murmur, rubs or gallops.  Abdomen:  Normal bowel sounds; soft; non tender; no organomegaly Pulses: Pulses normal in all 4 extremities. Extremities: No clubbing or cyanosis. No edema. Neurologic: Alert and oriented x 3.  EKG:  NSR.  Left axis deviation.    ASSESSMENT AND PLAN:

## 2012-12-18 NOTE — Assessment & Plan Note (Signed)
LDL was at target. Tolerates statin therapy.

## 2012-12-18 NOTE — Assessment & Plan Note (Signed)
Currently on meds.

## 2012-12-18 NOTE — Patient Instructions (Signed)
Your physician wants you to follow-up in: 6 MONTHS with Dr McAlhany (previous pt of Dr Stuckey). You will receive a reminder letter in the mail two months in advance. If you don't receive a letter, please call our office to schedule the follow-up appointment.  Your physician recommends that you continue on your current medications as directed. Please refer to the Current Medication list given to you today.  

## 2012-12-18 NOTE — Assessment & Plan Note (Signed)
Understands need to quit 

## 2012-12-18 NOTE — Assessment & Plan Note (Signed)
Remains stable on a medical regimen.

## 2012-12-18 NOTE — Telephone Encounter (Signed)
This encounter was created in error - please disregard.

## 2012-12-18 NOTE — Telephone Encounter (Signed)
New problem   Pt need form filled out for Crestor to get his meds free. It's an application to receive his meds free. Please call pt to let him know if doc can fill it out.

## 2012-12-20 ENCOUNTER — Encounter: Payer: Self-pay | Admitting: Family Medicine

## 2012-12-20 ENCOUNTER — Ambulatory Visit (INDEPENDENT_AMBULATORY_CARE_PROVIDER_SITE_OTHER): Payer: Self-pay | Admitting: Family Medicine

## 2012-12-20 VITALS — BP 152/90 | HR 86 | Temp 99.4°F | Wt 161.0 lb

## 2012-12-20 DIAGNOSIS — J441 Chronic obstructive pulmonary disease with (acute) exacerbation: Secondary | ICD-10-CM

## 2012-12-20 DIAGNOSIS — I10 Essential (primary) hypertension: Secondary | ICD-10-CM

## 2012-12-20 DIAGNOSIS — F17200 Nicotine dependence, unspecified, uncomplicated: Secondary | ICD-10-CM

## 2012-12-20 DIAGNOSIS — Z720 Tobacco use: Secondary | ICD-10-CM

## 2012-12-20 DIAGNOSIS — J45901 Unspecified asthma with (acute) exacerbation: Secondary | ICD-10-CM

## 2012-12-20 NOTE — Patient Instructions (Addendum)
Congratulations on being very motivated to quit smoking!  Your quit date is March 13th (Thursday) - get rid of all cigarettes and do not buy any more  Use yard work and your sone to replace smoking habit  Use lozenges for cravings  Follow up with your doctor in 1 month

## 2012-12-20 NOTE — Progress Notes (Signed)
Chief Complaint  Patient presents with  . Follow-up    COPD and SOB   . testosterone labs    HPI:  Follow up COPD: -seen in Jan by PCP and tx with prednisone for acute COPD exacerbation with a viral laryngitis with prednisone -does have episodes of increased mucus production coughing and SOB abut once a month - usually in the morning -also given chantix at the time, did not like chantix - had bad dreams -still smoking, smoking 1/2 pack per day -reason to quit: doesn't want COPD to get worse, also has heart disease and doesn't want this to get worse, also had a scare with with cancer -barriers: habit, nicotine addiction -motivation to quit: 7-8/10 -desire to quit: high -has tried nicotine replacement - didn't work -son wants him to quit, he is coming home next Thursday - this will be his quit date -last pfts 03/2012 and advised to quit smoking  ROS: See pertinent positives and negatives per HPI.  Past Medical History  Diagnosis Date  . Nodule of left lung     6-mm smooothly rounded nodule  . Coronary artery disease   . AMI (acute myocardial infarction)     Acute ST elevation  . Hyperlipidemia   . Syncope and collapse     in 2001 and 2004 with no clear cause  . Tobacco abuse   . Bipolar disorder     Family History  Problem Relation Age of Onset  . Other Mother 72    died  . Alcohol abuse Father 8    History   Social History  . Marital Status: Married    Spouse Name: N/A    Number of Children: N/A  . Years of Education: N/A   Social History Main Topics  . Smoking status: Current Every Day Smoker    Types: Cigarettes  . Smokeless tobacco: None     Comment: 1 pack a day  . Alcohol Use: Yes     Comment: heavy use of EtOH with 8-10 beers daily  . Drug Use: No     Comment: marijuana quit 2010. He has hx of THC use.  Marland Kitchen Sexually Active: None   Other Topics Concern  . None   Social History Narrative  . None    Current outpatient prescriptions:aspirin 81 MG  tablet, Take 81 mg by mouth daily.  , Disp: , Rfl: ;  FLUoxetine (PROZAC) 20 MG capsule, Take 20 mg by mouth daily.  , Disp: , Rfl: ;  metoprolol succinate (TOPROL-XL) 25 MG 24 hr tablet, Take 1 tablet (25 mg total) by mouth daily., Disp: 30 tablet, Rfl: 6;  Multiple Vitamin (MULTIVITAMIN) tablet, Take 1 tablet by mouth daily.  , Disp: , Rfl:  nitroGLYCERIN (NITROSTAT) 0.4 MG SL tablet, Place 1 tablet (0.4 mg total) under the tongue every 5 (five) minutes as needed., Disp: 25 tablet, Rfl: 6;  OLANZapine (ZYPREXA) 5 MG tablet, Take 5 mg by mouth at bedtime.  , Disp: , Rfl: ;  rosuvastatin (CRESTOR) 40 MG tablet, Take 1 tablet (40 mg total) by mouth daily., Disp: 90 tablet, Rfl: 3;  triamcinolone (KENALOG) 0.025 % cream, Apply topically 2 (two) times daily.  , Disp: , Rfl:  HYDROcodone-homatropine (HYCODAN) 5-1.5 MG/5ML syrup, Take 5 mLs by mouth every 8 (eight) hours as needed for cough., Disp: 120 mL, Rfl: 1;  predniSONE (DELTASONE) 20 MG tablet, 2 tabs x3 days, 1 tab x3 days, a half a tab x3 days, then a half a tablet Monday  Wednesday Friday for a two-week taper, Disp: 30 tablet, Rfl: 1  EXAM:  Filed Vitals:   12/20/12 0846  BP: 152/90  Pulse: 86  Temp: 99.4 F (37.4 C)    Body mass index is 26 kg/(m^2).  GENERAL: vitals reviewed and listed above, alert, oriented, appears well hydrated and in no acute distress  HEENT: atraumatic, conjunttiva clear, no obvious abnormalities on inspection of external nose and ears  NECK: no obvious masses on inspection  LUNGS: clear to auscultation bilaterally, no wheezes, rales or rhonchi, good air movement  CV: HRRR, no peripheral edema  MS: moves all extremities without noticeable abnormality  PSYCH: pleasant and cooperative, no obvious depression or anxiety  ASSESSMENT AND PLAN:  Discussed the following assessment and plan:  Tobacco abuse  Asthma with COPD with exacerbation  Essential hypertension, benign  -discussed options, decided with  interaction - with prozac will not do wellbutrin but he may do this in the future if fails again -he is very motivated to quit -he will use lozenges for cravings -quit date is March 13th, he called his son while here to let him know about quit date BP improve on recheck to 140/90 - he did not take BP med today - will f/u with PCP -follow in 1 month -Patient advised to return or notify a doctor immediately if symptoms worsen or persist or new concerns arise.  Patient Instructions  Congratulations on being very motivated to quit smoking!  Your quit date is March 13th (Thursday) - get rid of all cigarettes and do not buy any more  Use yard work and your sone to replace smoking habit  Use lozenges for cravings  Follow up with your doctor in 1 month     KIM, [facility] R.

## 2012-12-25 ENCOUNTER — Telehealth: Payer: Self-pay | Admitting: Cardiovascular Disease

## 2012-12-25 NOTE — Telephone Encounter (Signed)
Walk In pt Form " pt Dropped Off Letter for [name]" Will Hold Onto until  She Returns into office on Tuesday 12/25/12/KM

## 2013-01-23 ENCOUNTER — Ambulatory Visit: Payer: Self-pay | Admitting: Family Medicine

## 2013-01-24 ENCOUNTER — Ambulatory Visit: Payer: Self-pay | Admitting: Family Medicine

## 2013-02-27 ENCOUNTER — Other Ambulatory Visit: Payer: Self-pay | Admitting: *Deleted

## 2013-02-27 DIAGNOSIS — R0602 Shortness of breath: Secondary | ICD-10-CM

## 2013-02-27 DIAGNOSIS — R059 Cough, unspecified: Secondary | ICD-10-CM

## 2013-02-27 DIAGNOSIS — I2510 Atherosclerotic heart disease of native coronary artery without angina pectoris: Secondary | ICD-10-CM

## 2013-02-27 DIAGNOSIS — R05 Cough: Secondary | ICD-10-CM

## 2013-02-27 MED ORDER — METOPROLOL SUCCINATE ER 25 MG PO TB24: 25.0000 mg | ORAL_TABLET | Freq: Every day | ORAL | Status: DC

## 2013-05-02 ENCOUNTER — Telehealth: Payer: Self-pay | Admitting: Cardiovascular Disease

## 2013-05-02 NOTE — Telephone Encounter (Signed)
New Prob      Pt requesting a new prescription refill be sent into pharm for CRESTOR.

## 2013-05-02 NOTE — Telephone Encounter (Signed)
Pt is in need a new RX sent into [facility] for pt assistance program.  Last application (scanned into EPIC) is from 8/13.  Advised they may require renewal of application however pt very addiment they only need a new RX faxed to them at [phone].  Will forward to Dr Clifton James and his nurse Dennie Bible to complete the RX and fax in.

## 2013-05-03 NOTE — Telephone Encounter (Signed)
Spoke with [employer] and pt is active in Crestor assistance. Will need prescription faxed in. Urgent Fax is [phone]. I spoke with pt and told him I would have Dr. Clifton James sign when back in office on July 29.  Pt has a 60 day supply at home.  I told him to contact our office to see if we have samples if he runs out prior to receiving Crestor from assistance program.  Crestor will be mailed to pt's house.

## 2013-05-10 ENCOUNTER — Other Ambulatory Visit: Payer: Self-pay | Admitting: Cardiology

## 2013-05-17 NOTE — Telephone Encounter (Signed)
[place] prescription faxed to [place] and [place].

## 2013-06-26 ENCOUNTER — Telehealth: Payer: Self-pay | Admitting: Cardiovascular Disease

## 2013-06-26 NOTE — Telephone Encounter (Signed)
New problem   Patient calling asking for blood work when he comes in for appt 9/30.

## 2013-06-27 NOTE — Telephone Encounter (Signed)
Pt due for lipid and liver profiles. I spoke with pt and he will come in fasting for appt with Dr. Clifton James on July 17, 2013.

## 2013-07-02 ENCOUNTER — Telehealth: Payer: Self-pay | Admitting: Family Medicine

## 2013-07-02 NOTE — Telephone Encounter (Signed)
PT called and asked for a referral to see [facility], in regards to his COPD. Please assist.

## 2013-07-02 NOTE — Telephone Encounter (Signed)
Spoke with patient and he has made his own appointment

## 2013-07-17 ENCOUNTER — Encounter: Payer: Self-pay | Admitting: Cardiovascular Disease

## 2013-07-17 ENCOUNTER — Ambulatory Visit (INDEPENDENT_AMBULATORY_CARE_PROVIDER_SITE_OTHER): Payer: Self-pay | Admitting: Cardiovascular Disease

## 2013-07-17 VITALS — BP 143/84 | HR 59 | Ht 66.0 in | Wt 163.4 lb

## 2013-07-17 DIAGNOSIS — E785 Hyperlipidemia, unspecified: Secondary | ICD-10-CM

## 2013-07-17 DIAGNOSIS — I2510 Atherosclerotic heart disease of native coronary artery without angina pectoris: Secondary | ICD-10-CM

## 2013-07-17 DIAGNOSIS — F17200 Nicotine dependence, unspecified, uncomplicated: Secondary | ICD-10-CM

## 2013-07-17 LAB — LIPID PANEL
HDL: 56.2 mg/dL (ref >39.00)
Total CHOL/HDL Ratio: 2

## 2013-07-17 LAB — BASIC METABOLIC PANEL
CO2: 26 mEq/L (ref 19–32)
Calcium: 9.4 mg/dL (ref 8.4–10.5)
Chloride: 107 mEq/L (ref 96–112)
Glucose, Bld: 103 mg/dL — ABNORMAL HIGH (ref 70–99)
Sodium: 138 mEq/L (ref 135–145)

## 2013-07-17 LAB — CBC WITH DIFFERENTIAL/PLATELET
Basophils Absolute: 0.1 10*3/uL (ref 0.0–0.1)
Basophils Relative: 1 % (ref 0.0–3.0)
Eosinophils Absolute: 0.3 10*3/uL (ref 0.0–0.7)
Hemoglobin: 13.9 g/dL (ref 13.0–17.0)
Lymphocytes Relative: 19 % (ref 12.0–46.0)
Lymphs Abs: 1.4 10*3/uL (ref 0.7–4.0)
MCHC: 34.1 g/dL (ref 30.0–36.0)
MCV: 96.7 fl (ref 78.0–100.0)
Monocytes Absolute: 0.9 10*3/uL (ref 0.1–1.0)
Neutro Abs: 4.8 10*3/uL (ref 1.4–7.7)
RBC: 4.22 Mil/uL (ref 4.22–5.81)
RDW: 13.5 % (ref 11.5–14.6)

## 2013-07-17 LAB — HEPATIC FUNCTION PANEL
ALT: 31 U/L (ref 0–53)
AST: 28 U/L (ref 0–37)
Alkaline Phosphatase: 74 U/L (ref 39–117)
Bilirubin, Direct: 0 mg/dL (ref 0.0–0.3)
Total Bilirubin: 0.9 mg/dL (ref 0.3–1.2)

## 2013-07-17 NOTE — Progress Notes (Signed)
History of Present Illness: 62 yo male with history of CAD, COPD, tobacco abuse, HLD, bipolar disorder here today for cardiac follow up. He has been followed in the past by Dr. Riley Kill. He initially presented with an anterolateral STEMI in 2009 and had a bare metal stent placed in the large Diagonal branch. Last cath April 2011 with mild plaque LAD, patent Diagonal stent with 50% restenosis, 50% mid Circumflex stenosis, 30% distal RCA stenosis, 60% focal PLA stenosis. LV function was normal. Last stress myoview April 2012 without ischemia, LVEF=64%. He smokes 1 ppd.   He is here today for follow up. He denies chest pain, SOB, palpitations. He has stable exertional dyspnea due to COPD. Followed in [facility].   Primary Care Physician: Kelle Darting  Last Lipid Profile:Lipid Panel     Component Value Date/Time   CHOL 155 04/05/2012 1111   TRIG 107.0 04/05/2012 1111   HDL 74.80 04/05/2012 1111   CHOLHDL 2 04/05/2012 1111   VLDL 21.4 04/05/2012 1111   LDLCALC 59 04/05/2012 1111     Past Medical History  Diagnosis Date  . Nodule of left lung     6-mm smooothly rounded nodule  . Coronary artery disease   . AMI (acute myocardial infarction)     Acute ST elevation  . Hyperlipidemia   . Syncope and collapse     in 2001 and 2004 with no clear cause  . Tobacco abuse   . Bipolar disorder     Past Surgical History  Procedure Laterality Date  . None      Current Outpatient Prescriptions  Medication Sig Dispense Refill  . aspirin 81 MG tablet Take 81 mg by mouth daily.        Marland Kitchen FLUoxetine (PROZAC) 20 MG capsule Take 20 mg by mouth daily.        . metoprolol succinate (TOPROL-XL) 25 MG 24 hr tablet Take 1 tablet (25 mg total) by mouth daily.  30 tablet  9  . Multiple Vitamin (MULTIVITAMIN) tablet Take 1 tablet by mouth daily.        Marland Kitchen NITROSTAT 0.4 MG SL tablet PLACE 1 TABLET UNDER THE TONGUE EVERY 5 MINUTES AS NEEDED  25 tablet  1  . OLANZapine (ZYPREXA) 5 MG tablet Take 5 mg by  mouth at bedtime.        . rosuvastatin (CRESTOR) 40 MG tablet Take 1 tablet (40 mg total) by mouth daily.  90 tablet  3  . triamcinolone (KENALOG) 0.025 % cream Apply topically 2 (two) times daily.         No current facility-administered medications for this visit.    No Known Allergies  History   Social History  . Marital Status: Married    Spouse Name: N/A    Number of Children: 1  . Years of Education: N/A   Occupational History  . Retired-Sales/broker    Social History Main Topics  . Smoking status: Current Every Day Smoker -- 1.00 packs/day for 45 years    Types: Cigarettes  . Smokeless tobacco: Not on file     Comment: 1 pack a day  . Alcohol Use: 0.5 oz/week    1 drink(s) per week     Comment: heavy use of EtOH with 8-10 beers daily  . Drug Use: No     Comment: marijuana quit 2010. He has hx of THC use.  Marland Kitchen Sexual Activity: Not on file   Other Topics Concern  . Not on file  Social History Narrative  . No narrative on file    Family History  Problem Relation Age of Onset  . Other Mother 78    died  . Alcohol abuse Father 37  . CAD Neg Hx     Review of Systems:  As stated in the HPI and otherwise negative.   BP 143/84  Pulse 59  Ht 5\' 6"  (1.676 m)  Wt 163 lb 6.4 oz (74.118 kg)  BMI 26.39 kg/m2  Physical Examination: General: Well developed, well nourished, NAD HEENT: OP clear, mucus membranes moist SKIN: warm, dry. No rashes. Neuro: No focal deficits Musculoskeletal: Muscle strength 5/5 all ext Psychiatric: Mood and affect normal Neck: No JVD, no carotid bruits, no thyromegaly, no lymphadenopathy. Lungs:Clear bilaterally, no wheezes, rhonci, crackles Cardiovascular: Regular rate and rhythm. No murmurs, gallops or rubs. Abdomen:Soft. Bowel sounds present. Non-tender.  Extremities: No lower extremity edema. Pulses are 2 + in the bilateral DP/PT.  Assessment and Plan:   1. CAD: Stable. Continue current medical therapy. Will check CMET, CBC  today.   2. Tobacco abuse: Continues to smoke. 5 minutes spent on cessation counseling.   3. Hyperlipidemia: He is on a statin. Will check lipids and LFTs today.   20 minutes spent reviewing chart today.

## 2013-07-17 NOTE — Patient Instructions (Addendum)
Your physician wants you to follow-up in: 6 months.  You will receive a reminder letter in the mail two months in advance. If you don't receive a letter, please call our office to schedule the follow-up appointment.  We will call you with the results of lab work done today 

## 2013-07-18 ENCOUNTER — Telehealth: Payer: Self-pay | Admitting: Cardiovascular Disease

## 2013-07-18 NOTE — Telephone Encounter (Signed)
Spoke with pt and reviewed lab results with him. 

## 2013-07-18 NOTE — Telephone Encounter (Signed)
New problem ° ° °Pt returning your call. °

## 2013-07-30 ENCOUNTER — Telehealth: Payer: Self-pay | Admitting: Cardiovascular Disease

## 2013-07-30 MED ORDER — METOPROLOL SUCCINATE ER 25 MG PO TB24: 25.0000 mg | ORAL_TABLET | Freq: Every day | ORAL | Status: DC

## 2013-07-30 NOTE — Telephone Encounter (Signed)
New Problem  Pt request a medication change to Tropral Xl, Please call

## 2013-07-30 NOTE — Telephone Encounter (Signed)
[facility] confirms they do assist this patient with medications, they are awaiting script for the toprol XL 25mg , will fax to [phone]

## 2013-07-30 NOTE — Telephone Encounter (Signed)
New Problem,   Pt request medication change to Tropral XL. Request a call back to discuss.

## 2013-07-30 NOTE — Telephone Encounter (Signed)
Called requesting brand name Toprol XL 25 mg instead of Metoprolol Succ.  Send to [employer] [facility] fax-[phone].  Will send to refill

## 2013-08-03 ENCOUNTER — Telehealth: Payer: Self-pay | Admitting: *Deleted

## 2013-08-03 ENCOUNTER — Ambulatory Visit (INDEPENDENT_AMBULATORY_CARE_PROVIDER_SITE_OTHER): Payer: Self-pay | Admitting: Pulmonary Disease

## 2013-08-03 ENCOUNTER — Encounter: Payer: Self-pay | Admitting: Pulmonary Disease

## 2013-08-03 VITALS — BP 124/68 | HR 58 | Temp 98.5°F | Ht 66.0 in | Wt 165.0 lb

## 2013-08-03 DIAGNOSIS — G4733 Obstructive sleep apnea (adult) (pediatric): Secondary | ICD-10-CM | POA: Insufficient documentation

## 2013-08-03 DIAGNOSIS — Z23 Encounter for immunization: Secondary | ICD-10-CM

## 2013-08-03 DIAGNOSIS — J449 Chronic obstructive pulmonary disease, unspecified: Secondary | ICD-10-CM

## 2013-08-03 MED ORDER — ALBUTEROL SULFATE HFA 108 (90 BASE) MCG/ACT IN AERS: 2.0000 | INHALATION_SPRAY | Freq: Four times a day (QID) | RESPIRATORY_TRACT | Status: DC | PRN

## 2013-08-03 NOTE — Assessment & Plan Note (Signed)
Lung function has  Dropped from 2013 Focus on smoking cessation Can consider home sleep study if events recur Flu shot Sample of albuterol as  Rescue  

## 2013-08-03 NOTE — Assessment & Plan Note (Signed)
Home study to investigate nocturnal events

## 2013-08-03 NOTE — Progress Notes (Signed)
  Subjective:    Patient ID: Jeffrey Hudson, male    DOB: 04-18-1951, 62 y.o.   MRN: [id number]  HPI  PCP- Tawanna Cooler  62 year old smoker presents for evaluation of dyspnea. He reports episodic dyspnea that wakes him up from sleep. This sensation lasts for about an hour and subside spontaneously when he is sitting up. He also reports episodes during the daytime at rest. He reports difficulty inhaling during the episode. He reports occasional wheeze and heartburn requiring Zantac about once a week. He denies gasping episodes during sleep. Snoring has always been loud but he denies excessive daytime somnolence. He underwent UPPP in the past for deviated septum and sleep apnea. Smokes about a pack per day and has tried nicotine patch, Wellbutrin and Chantix. She also has bipolar disorder and reports that this is well controlled on medications. He denies episodes of anxiety or panic attacks. PFTs in 03/2012 showed FEV1 of 2.81-  99% FVC of 212% and ratio her of 63. Smaller airways were decreased at 41%. Lung volumes were preserved with DLCO 14.2-71%.  Spirometry today shows FEV1 of 2.59 -81% and FVC of 3.34-82% with ratio 78.   CT chest in 2004 showed 6 mm nodule in the left lower lobe.but was also noted in 1995. Last chest x-ray from 01/2011 shows changes of hyperinflation without any nodule      Review of Systems  Constitutional: Negative for fever and unexpected weight change.  HENT: Positive for postnasal drip. Negative for congestion, dental problem, ear pain, nosebleeds, rhinorrhea, sinus pressure, sneezing, sore throat and trouble swallowing.   Eyes: Negative for redness and itching.  Respiratory: Negative for cough, chest tightness, shortness of breath and wheezing.   Cardiovascular: Negative for palpitations and leg swelling.  Gastrointestinal: Negative for nausea and vomiting.  Genitourinary: Negative for dysuria.  Musculoskeletal: Negative for joint swelling.  Skin: Negative for rash.   Neurological: Negative for headaches.  Hematological: Does not bruise/bleed easily.  Psychiatric/Behavioral: Negative for dysphoric mood. The patient is not nervous/anxious.        Objective:   Physical Exam        Assessment & Plan:

## 2013-08-03 NOTE — Patient Instructions (Addendum)
Lung function has  Dropped from 2013 Focus on smoking cessation Can consider home sleep study if events recur Flu shot Sample of albuterol as  Rescue

## 2013-08-03 NOTE — Telephone Encounter (Deleted)
A representative from [facility], the pharmacy for [employer], called in requesting a verbal order for the patients Metoprolol. I gave her the verbal order, and she stated that we no longer need to send in a corrected script.

## 2013-08-03 NOTE — Telephone Encounter (Signed)
A pharmacist from [facility], the pharmacy for [employer], called in requesting a verbal order for the patients Metoprolol. I gave her the verbal order, and she stated that we no longer need to send in a corrected script.

## 2013-08-08 ENCOUNTER — Ambulatory Visit (INDEPENDENT_AMBULATORY_CARE_PROVIDER_SITE_OTHER)
Admission: RE | Admit: 2013-08-08 | Discharge: 2013-08-08 | Disposition: A | Discharge status: Home / Self Care | Payer: Self-pay | Source: Ambulatory Visit | Attending: Pulmonary Disease | Admitting: Pulmonary Disease

## 2013-08-08 DIAGNOSIS — J449 Chronic obstructive pulmonary disease, unspecified: Secondary | ICD-10-CM

## 2013-09-11 ENCOUNTER — Telehealth: Payer: Self-pay | Admitting: *Deleted

## 2013-09-11 NOTE — Telephone Encounter (Signed)
Refaxed [place] to [place] [facility] [place] for toprol xl 25 mg daily.

## 2013-09-28 ENCOUNTER — Telehealth: Payer: Self-pay | Admitting: Pulmonary Disease

## 2013-09-28 NOTE — Telephone Encounter (Signed)
Pt decided not to leave a message at this time.  Holly D Pryor ° °

## 2013-11-30 ENCOUNTER — Encounter: Payer: Self-pay | Admitting: Pulmonary Disease

## 2013-11-30 ENCOUNTER — Ambulatory Visit (INDEPENDENT_AMBULATORY_CARE_PROVIDER_SITE_OTHER): Payer: Self-pay | Admitting: Pulmonary Disease

## 2013-11-30 VITALS — BP 122/78 | HR 62 | Temp 98.2°F | Wt 168.4 lb

## 2013-11-30 DIAGNOSIS — J984 Other disorders of lung: Secondary | ICD-10-CM

## 2013-11-30 DIAGNOSIS — J449 Chronic obstructive pulmonary disease, unspecified: Secondary | ICD-10-CM

## 2013-11-30 NOTE — Patient Instructions (Signed)
Home sleep study only if symptoms recur SMOKING cessation remains most important Pneumonia shot next year We briefly discussed low dose CT screening for lung cancer 

## 2013-11-30 NOTE — Assessment & Plan Note (Signed)
Home sleep study only if symptoms recur SMOKING cessation remains most important Pneumonia shot next year We briefly discussed low dose CT screening for lung cancer

## 2013-11-30 NOTE — Assessment & Plan Note (Signed)
No FU required Discussed low dose CT screening, plan for next year

## 2013-11-30 NOTE — Progress Notes (Signed)
   Subjective:    Patient ID: Jeffrey Hudson, male    DOB: Nov 27, 1950, 63 y.o.   MRN: [id number]  HPI  PCP- Sherren Mocha   63 year old smoker presents for FU of dyspnea.  He reported episodic dyspnea that wakes him up from sleep. Snoring has always been loud but he denies excessive daytime somnolence. He underwent UPPP in the past for deviated septum and sleep apnea.  Smokes about a pack per day and has tried nicotine patch, Wellbutrin and Chantix. he also has bipolar disorder and reports that this is well controlled on medications. He denies episodes of anxiety or panic attacks.  PFTs in 03/2012 showed FEV1 of 2.81- 99% FVC of 212% and ratio  of 63. Smaller airways were decreased at 41%. Lung volumes were preserved with DLCO 14.2-71%.  Spirometry today shows FEV1 of 2.59 -81% and FVC of 3.34-82% with ratio 78.  CT chest in 2004 showed 6 mm nodule in the left lower lobe.but was also noted in 1995. Last chest x-ray from 01/2011 shows changes of hyperinflation without any nodule   Unable to quit smoking nocturnal events have stopped No dyspnea/ wheeze   Review of Systems neg for any significant sore throat, dysphagia, itching, sneezing, nasal congestion or excess/ purulent secretions, fever, chills, sweats, unintended wt loss, pleuritic or exertional cp, hempoptysis, orthopnea pnd or change in chronic leg swelling. Also denies presyncope, palpitations, heartburn, abdominal pain, nausea, vomiting, diarrhea or change in bowel or urinary habits, dysuria,hematuria, rash, arthralgias, visual complaints, headache, numbness weakness or ataxia.     Objective:   Physical Exam  Gen. Pleasant, well-nourished, in no distress ENT - no lesions, no post nasal drip Neck: No JVD, no thyromegaly, no carotid bruits Lungs: no use of accessory muscles, no dullness to percussion, clear without rales or rhonchi  Cardiovascular: Rhythm regular, heart sounds  normal, no murmurs or gallops, no peripheral  edema Musculoskeletal: No deformities, no cyanosis or clubbing         Assessment & Plan:

## 2014-03-01 ENCOUNTER — Ambulatory Visit (INDEPENDENT_AMBULATORY_CARE_PROVIDER_SITE_OTHER): Payer: Self-pay | Admitting: Cardiovascular Disease

## 2014-03-01 ENCOUNTER — Encounter: Payer: Self-pay | Admitting: Cardiovascular Disease

## 2014-03-01 VITALS — BP 150/80 | HR 54 | Ht 66.0 in | Wt 165.0 lb

## 2014-03-01 DIAGNOSIS — I2510 Atherosclerotic heart disease of native coronary artery without angina pectoris: Secondary | ICD-10-CM

## 2014-03-01 DIAGNOSIS — E785 Hyperlipidemia, unspecified: Secondary | ICD-10-CM

## 2014-03-01 DIAGNOSIS — F17200 Nicotine dependence, unspecified, uncomplicated: Secondary | ICD-10-CM

## 2014-03-01 NOTE — Progress Notes (Signed)
History of Present Illness: 63 yo male with history of CAD, COPD, tobacco abuse, HLD, bipolar disorder here today for cardiac follow up. He has been followed in the past by Dr. Lia Foyer. He initially presented with an anterolateral STEMI in 2009 and had a bare metal stent placed in the large Diagonal branch. Last cath April 2011 with mild plaque LAD, patent Diagonal stent with 50% restenosis, 50% mid Circumflex stenosis, 30% distal RCA stenosis, 60% focal PLA stenosis. LV function was normal. Last stress myoview April 2012 without ischemia, LVEF=64%.  He is here today for follow up. He denies chest pain, SOB, palpitations. He has stable exertional dyspnea due to COPD. Followed in [facility]. Still smoking less than 1ppd.   Primary Care Physician: Stevie Kern  Last Lipid Profile:Lipid Panel     Component Value Date/Time   CHOL 129 07/17/2013 0932   TRIG 80.0 07/17/2013 0932   HDL 56.20 07/17/2013 0932   CHOLHDL 2 07/17/2013 0932   VLDL 16.0 07/17/2013 0932   LDLCALC 57 07/17/2013 0932    Past Medical History  Diagnosis Date  . Nodule of left lung     6-mm smooothly rounded nodule  . Coronary artery disease   . AMI (acute myocardial infarction)     Acute ST elevation  . Hyperlipidemia   . Syncope and collapse     in 2001 and 2004 with no clear cause  . Tobacco abuse   . Bipolar disorder     Past Surgical History  Procedure Laterality Date  . Stent placed in heart      Current Outpatient Prescriptions  Medication Sig Dispense Refill  . albuterol (PROAIR HFA) 108 (90 BASE) MCG/ACT inhaler Inhale 2 puffs into the lungs every 6 (six) hours as needed.  1 Inhaler  0  . aspirin 81 MG tablet Take 81 mg by mouth daily.        Marland Kitchen FLUoxetine (PROZAC) 20 MG capsule Take 20 mg by mouth daily.        . metoprolol succinate (TOPROL XL) 25 MG 24 hr tablet Take 1 tablet (25 mg total) by mouth daily.  90 tablet  3  . Multiple Vitamin (MULTIVITAMIN) tablet Take 1 tablet by mouth daily.         Marland Kitchen NITROSTAT 0.4 MG SL tablet PLACE 1 TABLET UNDER THE TONGUE EVERY 5 MINUTES AS NEEDED  25 tablet  1  . OLANZapine (ZYPREXA) 5 MG tablet Take 5 mg by mouth at bedtime.        . rosuvastatin (CRESTOR) 40 MG tablet Take 1 tablet (40 mg total) by mouth daily.  90 tablet  3  . triamcinolone (KENALOG) 0.025 % cream Apply topically 2 (two) times daily.         No current facility-administered medications for this visit.    No Known Allergies  History   Social History  . Marital Status: Married    Spouse Name: N/A    Number of Children: 1  . Years of Education: N/A   Occupational History  . Retired-Sales/broker    Social History Main Topics  . Smoking status: Current Every Day Smoker -- 1.00 packs/day for 45 years    Types: Cigarettes  . Smokeless tobacco: Not on file     Comment: 1 pack a day  . Alcohol Use: 0.5 oz/week    1 drink(s) per week     Comment: 2 glasses of wine daily  . Drug Use: No     Comment:  marijuana quit 2010. He has hx of THC use.  Marland Kitchen Sexual Activity: Not on file   Other Topics Concern  . Not on file   Social History Narrative  . No narrative on file    Family History  Problem Relation Age of Onset  . Other Mother 22    died  . Alcohol abuse Father 44    Review of Systems:  As stated in the HPI and otherwise negative.   BP 150/80  Pulse 54  Ht 5\' 6"  (1.676 m)  Wt 165 lb (74.844 kg)  BMI 26.64 kg/m2  Physical Examination: General: Well developed, well nourished, NAD HEENT: OP clear, mucus membranes moist SKIN: warm, dry. No rashes. Neuro: No focal deficits Musculoskeletal: Muscle strength 5/5 all ext Psychiatric: Mood and affect normal Neck: No JVD, no carotid bruits, no thyromegaly, no lymphadenopathy. Lungs:Clear bilaterally, no wheezes, rhonci, crackles Cardiovascular: Regular rate and rhythm. No murmurs, gallops or rubs. Abdomen:Soft. Bowel sounds present. Non-tender.  Extremities: No lower extremity edema. Pulses are 2 + in the  bilateral DP/PT.  EKG: sinus brady, rate 54 bpm. LVH  Assessment and Plan:   1. CAD: Stable. Continue current medical therapy. Consider stress test at next visit.   2. Tobacco abuse: Continues to smoke. 5 minutes spent on cessation counseling.   3. Hyperlipidemia: He is on a statin. He has done well on Crestor but he may be dropped from the assistance program. If he does get dropped, will change to atorvastatin 80 mg daily.

## 2014-03-01 NOTE — Patient Instructions (Signed)
Your physician wants you to follow-up in:  January or February 2016 You will receive a reminder letter in the mail two months in advance. If you don't receive a letter, please call our office to schedule the follow-up appointment.

## 2014-03-04 ENCOUNTER — Telehealth: Payer: Self-pay | Admitting: *Deleted

## 2014-03-04 NOTE — Telephone Encounter (Signed)
Patient is [place] forms for [place] and [place] for crestor per Dr Angelena Form, I have provider part and will are awaiting the patients forms to be mailed to [place].

## 2014-04-02 ENCOUNTER — Telehealth: Payer: Self-pay | Admitting: Cardiovascular Disease

## 2014-04-02 NOTE — Telephone Encounter (Signed)
New message     Want a presc for generic lipitor for 90day supply----[facility].  Patient was on crestor.  Call pt and let him know if we called in presc

## 2014-04-02 NOTE — Telephone Encounter (Signed)
LMTCB

## 2014-04-02 NOTE — Telephone Encounter (Signed)
Pt has been denied for the Crestor patient assistance program. Pt had been told if he was denied for Crestor patient  assistance program he could be changed to atorvastatin 80mg  daily from crestor.  Pt has about 60 more days of crestor but would like to go ahead and get 90 day prescription for atorvastatin if this is still Dr Camillia Herter recommendation.   I will forward to Dr Angelena Form for review.

## 2014-04-02 NOTE — Telephone Encounter (Signed)
Agree that this would be appropriate. Thanks, chris

## 2014-04-03 NOTE — Telephone Encounter (Signed)
Left message to call back  

## 2014-04-04 MED ORDER — ATORVASTATIN CALCIUM 80 MG PO TABS: 80.0000 mg | ORAL_TABLET | Freq: Every day | ORAL | Status: DC

## 2014-04-04 NOTE — Telephone Encounter (Signed)
Spoke with pt and told him change to atorvastatin would be fine per Dr. Angelena Form. Pt does not want prescription sent in at this time. He has 90 day supply of Crestor left and will finish these and then let us know when he would like prescription sent in.  Pt aware he will need lipid and liver profiles checked 12 weeks after making change (early December).  This will be arranged when he calls to have prescription sent in.

## 2014-05-16 ENCOUNTER — Telehealth: Payer: Self-pay | Admitting: Cardiovascular Disease

## 2014-05-16 NOTE — Telephone Encounter (Signed)
Left message to call back  

## 2014-05-16 NOTE — Telephone Encounter (Signed)
°  Patient stated that doctor was changing his crestor and he wants to know what it will be changed to? Please call and advise.

## 2014-05-16 NOTE — Telephone Encounter (Signed)
Spoke with pt and told him change would be to Atorvastatin 80 mg daily. He is checking on price of this to determine where he would like to have filled. He has 45 days of Crestor left. He will call back to let us know when to fill Atorvastatin.

## 2014-06-11 ENCOUNTER — Telehealth: Payer: Self-pay | Admitting: *Deleted

## 2014-06-11 ENCOUNTER — Telehealth: Payer: Self-pay | Admitting: Cardiovascular Disease

## 2014-06-11 MED ORDER — ATORVASTATIN CALCIUM 80 MG PO TABS: 80.0000 mg | ORAL_TABLET | Freq: Every day | ORAL | Status: DC

## 2014-06-11 NOTE — Telephone Encounter (Signed)
Patient requests a printed rx for atorvastatin so that he can fax it to [place]. He would like it to be for a quantity of #112 with 1 refill. He would like a call when this is ready for pick up. Thanks, MI

## 2014-06-11 NOTE — Telephone Encounter (Signed)
Prescription written and signed. Left message for pt to call back

## 2014-06-11 NOTE — Telephone Encounter (Deleted)
Error

## 2014-06-13 NOTE — Telephone Encounter (Signed)
Refill team has spoken with pt and prescription has been left at front desk for him to pick up.

## 2014-07-08 ENCOUNTER — Ambulatory Visit: Payer: Self-pay | Admitting: Cardiovascular Disease

## 2014-07-09 ENCOUNTER — Telehealth: Payer: Self-pay | Admitting: Cardiovascular Disease

## 2014-07-09 DIAGNOSIS — E785 Hyperlipidemia, unspecified: Secondary | ICD-10-CM

## 2014-07-09 NOTE — Telephone Encounter (Signed)
New message          Pt would like for Pat to give him a call back / pt did not disclose any info

## 2014-07-09 NOTE — Telephone Encounter (Signed)
Spoke with pt. He is starting Atorvastatin on July 13, 2014. He will come in for fasting lipid and liver profiles the week of December 14,2015.

## 2014-07-30 NOTE — Telephone Encounter (Signed)
error 

## 2014-08-02 ENCOUNTER — Other Ambulatory Visit: Payer: Self-pay

## 2014-09-03 ENCOUNTER — Other Ambulatory Visit: Payer: Self-pay

## 2014-09-03 MED ORDER — METOPROLOL SUCCINATE ER 25 MG PO TB24: 25.0000 mg | ORAL_TABLET | Freq: Every day | ORAL | Status: DC

## 2014-09-05 ENCOUNTER — Other Ambulatory Visit: Payer: Self-pay | Admitting: *Deleted

## 2014-09-05 MED ORDER — METOPROLOL SUCCINATE ER 25 MG PO TB24: 25.0000 mg | ORAL_TABLET | Freq: Every day | ORAL | Status: DC

## 2014-10-01 ENCOUNTER — Other Ambulatory Visit (INDEPENDENT_AMBULATORY_CARE_PROVIDER_SITE_OTHER): Payer: Medicare Other | Admitting: *Deleted

## 2014-10-01 DIAGNOSIS — E785 Hyperlipidemia, unspecified: Secondary | ICD-10-CM

## 2014-10-01 LAB — HEPATIC FUNCTION PANEL
ALBUMIN: 4 g/dL (ref 3.5–5.2)
ALK PHOS: 91 U/L (ref 39–117)
ALT: 39 U/L (ref 0–53)
AST: 37 U/L (ref 0–37)
Bilirubin, Direct: 0.1 mg/dL (ref 0.0–0.3)
TOTAL PROTEIN: 6.9 g/dL (ref 6.0–8.3)
Total Bilirubin: 1.2 mg/dL (ref 0.2–1.2)

## 2014-10-01 LAB — LIPID PANEL
Cholesterol: 161 mg/dL (ref 0–200)
HDL: 45.5 mg/dL (ref >39.00)
LDL Cholesterol: 83 mg/dL (ref 0–99)
NONHDL: 115.5
TRIGLYCERIDES: 161 mg/dL — AB (ref 0.0–149.0)
Total CHOL/HDL Ratio: 4
VLDL: 32.2 mg/dL (ref 0.0–40.0)

## 2014-11-05 DIAGNOSIS — F654 Pedophilia: Secondary | ICD-10-CM | POA: Diagnosis not present

## 2014-11-12 ENCOUNTER — Other Ambulatory Visit: Payer: Self-pay | Admitting: Cardiovascular Disease

## 2014-11-13 ENCOUNTER — Other Ambulatory Visit: Payer: Self-pay

## 2014-11-13 MED ORDER — METOPROLOL SUCCINATE ER 25 MG PO TB24: 25.0000 mg | ORAL_TABLET | Freq: Every day | ORAL | Status: DC

## 2014-12-06 ENCOUNTER — Encounter: Payer: Self-pay | Admitting: Cardiovascular Disease

## 2014-12-06 ENCOUNTER — Ambulatory Visit (INDEPENDENT_AMBULATORY_CARE_PROVIDER_SITE_OTHER): Payer: Medicare Other | Admitting: Cardiovascular Disease

## 2014-12-06 VITALS — BP 132/68 | HR 55 | Ht 66.0 in | Wt 166.0 lb

## 2014-12-06 DIAGNOSIS — Z720 Tobacco use: Secondary | ICD-10-CM

## 2014-12-06 DIAGNOSIS — I2510 Atherosclerotic heart disease of native coronary artery without angina pectoris: Secondary | ICD-10-CM

## 2014-12-06 DIAGNOSIS — F17200 Nicotine dependence, unspecified, uncomplicated: Secondary | ICD-10-CM

## 2014-12-06 DIAGNOSIS — E785 Hyperlipidemia, unspecified: Secondary | ICD-10-CM | POA: Diagnosis not present

## 2014-12-06 MED ORDER — NITROGLYCERIN 0.4 MG SL SUBL: SUBLINGUAL_TABLET | SUBLINGUAL | Status: DC

## 2014-12-06 NOTE — Patient Instructions (Signed)
Your physician wants you to follow-up in:  12 months. You will receive a reminder letter in the mail two months in advance. If you don't receive a letter, please call our office to schedule the follow-up appointment.  Your physician has requested that you have a lexiscan myoview. For further information please visit [email or web address]. Please follow instruction sheet, as given.    

## 2014-12-06 NOTE — Progress Notes (Signed)
History of Present Illness: 64 yo male with history of CAD, COPD, tobacco abuse, HLD, bipolar disorder here today for cardiac follow up. He has been followed in the past by Dr. Lia Foyer. He initially presented with an anterolateral STEMI in 2009 and had a bare metal stent placed in the large Diagonal branch. Last cath April 2011 with mild plaque LAD, patent Diagonal stent with 50% restenosis, 50% mid Circumflex stenosis, 30% distal RCA stenosis, 60% focal PLA stenosis. LV function was normal. Last stress myoview April 2012 without ischemia, LVEF=64%.  He is here today for follow up. He denies chest pain, SOB, palpitations. He has stable exertional dyspnea due to COPD. Followed in [facility]. Still smoking less than 1ppd.   Primary Care Physician: Stevie Kern  Last Lipid Profile:Lipid Panel     Component Value Date/Time   CHOL 161 10/01/2014 0912   TRIG 161.0* 10/01/2014 0912   HDL 45.50 10/01/2014 0912   CHOLHDL 4 10/01/2014 0912   VLDL 32.2 10/01/2014 0912   LDLCALC 83 10/01/2014 0912    Past Medical History  Diagnosis Date  . Nodule of left lung     6-mm smooothly rounded nodule  . Coronary artery disease   . AMI (acute myocardial infarction)     Acute ST elevation  . Hyperlipidemia   . Syncope and collapse     in 2001 and 2004 with no clear cause  . Tobacco abuse   . Bipolar disorder     Past Surgical History  Procedure Laterality Date  . Stent placed in heart      Current Outpatient Prescriptions  Medication Sig Dispense Refill  . albuterol (PROAIR HFA) 108 (90 BASE) MCG/ACT inhaler Inhale 2 puffs into the lungs every 6 (six) hours as needed. 1 Inhaler 0  . aspirin 81 MG tablet Take 81 mg by mouth daily.      Marland Kitchen atorvastatin (LIPITOR) 80 MG tablet Take 1 tablet (80 mg total) by mouth daily. 112 tablet 1  . FLUoxetine (PROZAC) 20 MG capsule Take 20 mg by mouth daily.      . metoprolol succinate (TOPROL XL) 25 MG 24 hr tablet Take 1 tablet (25 mg total) by  mouth daily. 90 tablet 0  . Multiple Vitamin (MULTIVITAMIN) tablet Take 1 tablet by mouth daily.      Marland Kitchen NITROSTAT 0.4 MG SL tablet PLACE 1 TABLET UNDER THE TONGUE EVERY 5 MINUTES AS NEEDED 25 tablet 1  . OLANZapine (ZYPREXA) 5 MG tablet Take 5 mg by mouth at bedtime.      . triamcinolone (KENALOG) 0.025 % cream Apply topically 2 (two) times daily.       No current facility-administered medications for this visit.    No Known Allergies  History   Social History  . Marital Status: Married    Spouse Name: N/A  . Number of Children: 1  . Years of Education: N/A   Occupational History  . Retired-Sales/broker    Social History Main Topics  . Smoking status: Current Every Day Smoker -- 1.00 packs/day for 45 years    Types: Cigarettes  . Smokeless tobacco: Not on file     Comment: 1 pack a day  . Alcohol Use: 0.5 oz/week    1 drink(s) per week     Comment: 2 glasses of wine daily  . Drug Use: No     Comment: marijuana quit 2010. He has hx of THC use.  Marland Kitchen Sexual Activity: Not on file   Other  Topics Concern  . Not on file   Social History Narrative    Family History  Problem Relation Age of Onset  . Other Mother 76    died  . Alcohol abuse Father 44    Review of Systems:  As stated in the HPI and otherwise negative.   BP 132/68 mmHg  Pulse 55  Ht 5\' 6"  (1.676 m)  Wt 166 lb (75.297 kg)  BMI 26.81 kg/m2  Physical Examination: General: Well developed, well nourished, NAD HEENT: OP clear, mucus membranes moist SKIN: warm, dry. No rashes. Neuro: No focal deficits Musculoskeletal: Muscle strength 5/5 all ext Psychiatric: Mood and affect normal Neck: No JVD, no carotid bruits, no thyromegaly, no lymphadenopathy. Lungs:Clear bilaterally, no wheezes, rhonci, crackles Cardiovascular: Regular rate and rhythm. No murmurs, gallops or rubs. Abdomen:Soft. Bowel sounds present. Non-tender.  Extremities: No lower extremity edema. Pulses are 2 + in the bilateral DP/PT.  EKG:  Sinus brady, rate 65 bpm.   Assessment and Plan:   1. CAD: Stable. Continue current medical therapy. He has had no ischemic testing for the last 5 years. Last cath in 2011 with moderate disease. Will arrange Lexiscan stress myoview to exclude ischemia with ongoing tobacco abuse.    2. Tobacco abuse: Continues to smoke. 5 minutes spent on cessation counseling.   3. Hyperlipidemia: Continue statin. (had been on Crestor but changed to Lipitor due to cost).

## 2014-12-12 ENCOUNTER — Encounter: Payer: Self-pay | Admitting: Endocrinology

## 2014-12-12 ENCOUNTER — Encounter: Payer: Self-pay | Admitting: Cardiology

## 2014-12-18 ENCOUNTER — Ambulatory Visit: Payer: Medicare Other | Admitting: Family Medicine

## 2014-12-19 ENCOUNTER — Ambulatory Visit (HOSPITAL_COMMUNITY): Payer: Medicare Other | Attending: Internal Medicine | Admitting: Radiology

## 2014-12-19 DIAGNOSIS — I2510 Atherosclerotic heart disease of native coronary artery without angina pectoris: Secondary | ICD-10-CM | POA: Diagnosis not present

## 2014-12-19 MED ORDER — REGADENOSON 0.4 MG/5ML IV SOLN
0.4000 mg | Freq: Once | INTRAVENOUS | Status: AC
  Administered 2014-12-19: 0.4 mg via INTRAVENOUS

## 2014-12-19 MED ORDER — TECHNETIUM TC 99M SESTAMIBI GENERIC - CARDIOLITE
33.0000 | Freq: Once | INTRAVENOUS | Status: AC | PRN
  Administered 2014-12-19: 33 via INTRAVENOUS

## 2014-12-19 MED ORDER — TECHNETIUM TC 99M SESTAMIBI GENERIC - CARDIOLITE
11.0000 | Freq: Once | INTRAVENOUS | Status: AC | PRN
  Administered 2014-12-19: 11 via INTRAVENOUS

## 2014-12-19 NOTE — Progress Notes (Signed)
[facility] [address] [place], [place] [address] [phone]    Cardiology Nuclear Med Study  Jeffrey Hudson is a 64 y.o. male     MRN : [id number]     DOB: 1951/07/08  Procedure Date: 12/19/2014  Nuclear Med Background Indication for Stress Test:  Evaluation for Ischemia, Stent Patency and Follow up CAD History:  CAD-Stent, 01/19/11 MPI: 64% NL, COPD Cardiac Risk Factors: Hypertension  Symptoms:  DOE   Nuclear Pre-Procedure Caffeine/Decaff Intake:  None> 12 hrs NPO After: 7:30pm   Lungs:  clear O2 Sat: 96% on room air. IV 0.9% NS with Angio Cath:  22g  IV Site: R Hand x 1, tolerated well IV Started by:  Irven Baltimore, RN  Chest Size (in):  40 Cup Size: n/a  Height: 5\' 6"  (1.676 m)  Weight:  159 lb (72.122 kg)  BMI:  Body mass index is 25.68 kg/(m^2). Tech Comments:  Patient took Toprol this am. Irven Baltimore, RN.    Nuclear Med Study 1 or 2 day study: 1 day  Stress Test Type:  Lexiscan  Reading MD: N/A  Order Authorizing Provider:  Lauree Chandler, MD  Resting Radionuclide: Technetium 44m Sestamibi  Resting Radionuclide Dose: 11.0 mCi   Stress Radionuclide:  Technetium 66m Sestamibi  Stress Radionuclide Dose: 33.0 mCi           Stress Protocol Rest HR: 64 Stress HR: 100  Rest BP: 150/88 Stress BP: 160/86  Exercise Time (min): n/a METS: n/a   Predicted Max HR: 155 bpm % Max HR: 64.52 bpm Rate Pressure Product: 16000   Dose of Adenosine (mg):  n/a Dose of Lexiscan: 0.4 mg  Dose of Atropine (mg): n/a Dose of Dobutamine: n/a mcg/kg/min (at max HR)  Stress Test Technologist: Perrin Maltese, EMT-P  Nuclear Technologist:  Earl Many, CNMT     Rest Procedure:  Myocardial perfusion imaging was performed at rest 45 minutes following the intravenous administration of Technetium 69m Sestamibi. Rest ECG: NSR - Normal EKG  Stress Procedure:  The patient received IV Lexiscan 0.4 mg over 15-seconds.  Technetium 7m Sestamibi  injected at 30-seconds. This patient was sob, felt weird, had nausea, and had chest tightness with the Lexiscan injection. Quantitative spect images were obtained after a 45 minute delay. Stress ECG: nonspecific ST changes  QPS Raw Data Images:  Normal; no motion artifact; normal heart/lung ratio. Stress Images:  Normal homogeneous uptake in all areas of the myocardium. Rest Images:  Normal homogeneous uptake in all areas of the myocardium. Subtraction (SDS):  No evidence of ischemia. Transient Ischemic Dilatation (Normal <1.22):  1.13 Lung/Heart Ratio (Normal <0.45):  0.31  Quantitative Gated Spect Images QGS EDV:  87 ml QGS ESV:  32 ml  Impression Exercise Capacity:  Lexiscan with no exercise. BP Response:  Normal blood pressure response. Clinical Symptoms:  No significant symptoms noted. ECG Impression:  No significant ST segment change suggestive of ischemia. Comparison with Prior Nuclear Study: No significant change from previous study  Overall Impression:  Normal stress nuclear study.  LV Ejection Fraction: 64%.  LV Wall Motion:  NL LV Function; NL Wall Motion    Sanda Klein, MD, [facility] [phone] office [phone] pager

## 2014-12-23 ENCOUNTER — Telehealth: Payer: Self-pay | Admitting: Pulmonary Disease

## 2014-12-23 DIAGNOSIS — J438 Other emphysema: Secondary | ICD-10-CM

## 2014-12-23 NOTE — Telephone Encounter (Signed)
Per 11/20/13 OV:  Patient Instructions       Home sleep study only if symptoms recur SMOKING cessation remains most important Pneumonia shot next year We briefly discussed low dose CT screening for lung cancer    Pt has no pending appt with RA and last seen 11/20/13  Called pt and LMTCB x1

## 2014-12-23 NOTE — Telephone Encounter (Signed)
[phone], pt cb

## 2014-12-23 NOTE — Telephone Encounter (Signed)
Spoke with pt.  Pt states he would like to proceed with low dose CT screening for lung ca if covered by insurance.   Pt has not been seen > 1 yr.  Offered to schedule appt - pt states "I don't really feel like I need to come in."  States he has SOB and cough with white mucus but symptoms are unchanged since last seeing RA.  He is requesting to see if RA will proceed with ordering CT.  Dr. Elsworth Soho, pls advise.  Thank you.

## 2014-12-25 NOTE — Telephone Encounter (Signed)
Ok to order a screening chest CT-no contrast Diagnosis- emphysema Please ensure that this is covered by insurance first

## 2014-12-25 NOTE — Telephone Encounter (Signed)
lmtcb

## 2014-12-26 NOTE — Telephone Encounter (Signed)
Order has been placed. Pt is aware. Nothing further was needed. 

## 2014-12-26 NOTE — Telephone Encounter (Signed)
Pt returned call [phone]

## 2014-12-27 ENCOUNTER — Telehealth: Payer: Self-pay | Admitting: Acute Care

## 2014-12-27 NOTE — Telephone Encounter (Signed)
Patient does qualify for LDCT screening program. Spoke with him about program. Dr. Elsworth Soho wrote order for CT. Will schedule for shared decision making visit before next annual CT As  I have no schedule to book appointments yet. Pt. is scheduled for CT 12/31/14

## 2014-12-30 ENCOUNTER — Other Ambulatory Visit: Payer: Self-pay | Admitting: *Deleted

## 2014-12-30 MED ORDER — ATORVASTATIN CALCIUM 80 MG PO TABS: 80.0000 mg | ORAL_TABLET | Freq: Every day | ORAL | Status: DC

## 2014-12-31 ENCOUNTER — Ambulatory Visit (INDEPENDENT_AMBULATORY_CARE_PROVIDER_SITE_OTHER)
Admission: RE | Admit: 2014-12-31 | Discharge: 2014-12-31 | Disposition: A | Discharge status: Home / Self Care | Payer: Medicare Other | Source: Ambulatory Visit | Attending: Pulmonary Disease | Admitting: Pulmonary Disease

## 2014-12-31 DIAGNOSIS — J438 Other emphysema: Secondary | ICD-10-CM | POA: Diagnosis not present

## 2014-12-31 DIAGNOSIS — F17210 Nicotine dependence, cigarettes, uncomplicated: Secondary | ICD-10-CM | POA: Diagnosis not present

## 2014-12-31 DIAGNOSIS — Z122 Encounter for screening for malignant neoplasm of respiratory organs: Secondary | ICD-10-CM

## 2014-12-31 DIAGNOSIS — F17200 Nicotine dependence, unspecified, uncomplicated: Secondary | ICD-10-CM | POA: Diagnosis not present

## 2015-01-01 ENCOUNTER — Encounter: Payer: Self-pay | Admitting: Pulmonary Disease

## 2015-01-01 ENCOUNTER — Ambulatory Visit (INDEPENDENT_AMBULATORY_CARE_PROVIDER_SITE_OTHER): Payer: Medicare Other | Admitting: Pulmonary Disease

## 2015-01-01 ENCOUNTER — Other Ambulatory Visit (INDEPENDENT_AMBULATORY_CARE_PROVIDER_SITE_OTHER): Payer: Medicare Other

## 2015-01-01 VITALS — BP 110/70 | HR 62 | Ht 66.0 in | Wt 161.4 lb

## 2015-01-01 DIAGNOSIS — R945 Abnormal results of liver function studies: Secondary | ICD-10-CM

## 2015-01-01 DIAGNOSIS — R918 Other nonspecific abnormal finding of lung field: Secondary | ICD-10-CM

## 2015-01-01 DIAGNOSIS — R7989 Other specified abnormal findings of blood chemistry: Secondary | ICD-10-CM

## 2015-01-01 DIAGNOSIS — J432 Centrilobular emphysema: Secondary | ICD-10-CM | POA: Diagnosis not present

## 2015-01-01 LAB — HEPATIC FUNCTION PANEL
ALK PHOS: 96 U/L (ref 39–117)
ALT: 45 U/L (ref 0–53)
AST: 42 U/L — ABNORMAL HIGH (ref 0–37)
Albumin: 4.2 g/dL (ref 3.5–5.2)
BILIRUBIN DIRECT: 0.2 mg/dL (ref 0.0–0.3)
Total Bilirubin: 0.9 mg/dL (ref 0.2–1.2)
Total Protein: 6.7 g/dL (ref 6.0–8.3)

## 2015-01-01 NOTE — Progress Notes (Signed)
   Subjective:    Patient ID: Jeffrey Hudson, male    DOB: 06/28/1951, 64 y.o.   MRN: [id number]  HPI  PCP- Sherren Mocha   64 year-old smoker presents for FU of dyspnea.  He reported episodic dyspnea that wakes him up from sleep. Snoring has always been loud but he denies excessive daytime somnolence. He underwent UPPP in the past for deviated septum and sleep apnea.  Smokes about a pack per day and has tried nicotine patch, Wellbutrin and Chantix. he also has bipolar disorder and reports that this is well controlled on medications.   01/01/2015  Chief Complaint  Patient presents with  . Follow-up    Discuss Ct Results.   Discussed screening CT results Prior to obtaining CT, risks & benefits were discussed  Significant tests/ events  PFTs 03/2012 showed FEV1 of 2.81- 99% FVC of 212% and ratio  of 63. Smaller airways were decreased at 41%. Lung volumes were preserved with DLCO 14.2-71%.  Spirometry 2015 shows FEV1 of 2.59 -81% and FVC of 3.34-82% with ratio 78.  CT chest in 2004 showed 6 mm nodule in the left lower lobe.but was also noted in 1995.  LDCT 12/2014 Numerous scattered noncalcified pulmonary nodules, measuring up to 7 mm in the left lower lobe, mild to moderate hepatic steatosis  LFTs-  Borderline AST/ALT  Review of Systems  neg for any significant sore throat, dysphagia, itching, sneezing, nasal congestion or excess/ purulent secretions, fever, chills, sweats, unintended wt loss, pleuritic or exertional cp, hempoptysis, orthopnea pnd or change in chronic leg swelling. Also denies presyncope, palpitations, heartburn, abdominal pain, nausea, vomiting, diarrhea or change in bowel or urinary habits, dysuria,hematuria, rash, arthralgias, visual complaints, headache, numbness weakness or ataxia.     Objective:   Physical Exam  Gen. Pleasant, well-nourished, in no distress ENT - no lesions, no post nasal drip Neck: No JVD, no thyromegaly, no carotid bruits Lungs: no use of  accessory muscles, no dullness to percussion, clear without rales or rhonchi  Cardiovascular: Rhythm regular, heart sounds  normal, no murmurs or gallops, no peripheral edema Musculoskeletal: No deformities, no cyanosis or clubbing        Assessment & Plan:

## 2015-01-01 NOTE — Assessment & Plan Note (Signed)
We discussed nodules in lung - repeat CT scan in 6 months Good luck with your quit attempt

## 2015-01-01 NOTE — Patient Instructions (Signed)
Liver tests We discussed nodules in lung - repeat CT scan in 6 months Good luck with your quit attempt

## 2015-01-02 ENCOUNTER — Telehealth: Payer: Self-pay | Admitting: Family Medicine

## 2015-01-02 ENCOUNTER — Encounter: Payer: Self-pay | Admitting: *Deleted

## 2015-01-02 NOTE — Telephone Encounter (Signed)
Noted in chart.

## 2015-01-02 NOTE — Telephone Encounter (Signed)
Patient called back and stated he had his flu shot a couple months ago at [facility] [address]

## 2015-01-02 NOTE — Assessment & Plan Note (Signed)
Smoking cessation emphasized

## 2015-01-02 NOTE — Assessment & Plan Note (Signed)
FU CT in 37mnths

## 2015-01-02 NOTE — Assessment & Plan Note (Signed)
FU LFTs - on statin - abnormal appearance on CT

## 2015-01-17 ENCOUNTER — Telehealth: Payer: Self-pay

## 2015-01-17 MED ORDER — FLUOXETINE HCL 20 MG PO CAPS: 20.0000 mg | ORAL_CAPSULE | Freq: Every day | ORAL | Status: DC

## 2015-01-17 NOTE — Telephone Encounter (Signed)
[facility] refill request for OLANZAPINE and FLUOXETINE

## 2015-01-21 ENCOUNTER — Other Ambulatory Visit: Payer: Self-pay | Admitting: Family Medicine

## 2015-02-04 ENCOUNTER — Ambulatory Visit (INDEPENDENT_AMBULATORY_CARE_PROVIDER_SITE_OTHER): Payer: Medicare Other | Admitting: Family Medicine

## 2015-02-04 DIAGNOSIS — F3170 Bipolar disorder, currently in remission, most recent episode unspecified: Secondary | ICD-10-CM

## 2015-02-04 DIAGNOSIS — Z0000 Encounter for general adult medical examination without abnormal findings: Secondary | ICD-10-CM

## 2015-02-04 DIAGNOSIS — Z720 Tobacco use: Secondary | ICD-10-CM | POA: Diagnosis not present

## 2015-02-04 DIAGNOSIS — F17200 Nicotine dependence, unspecified, uncomplicated: Secondary | ICD-10-CM

## 2015-02-04 DIAGNOSIS — Z23 Encounter for immunization: Secondary | ICD-10-CM

## 2015-02-04 LAB — BASIC METABOLIC PANEL
BUN: 20 mg/dL (ref 6–23)
CALCIUM: 9.7 mg/dL (ref 8.4–10.5)
CO2: 26 mEq/L (ref 19–32)
Chloride: 104 mEq/L (ref 96–112)
Creatinine, Ser: 1 mg/dL (ref 0.40–1.50)
GFR: 79.62 mL/min (ref >60.00)
GLUCOSE: 97 mg/dL (ref 70–99)
Potassium: 4.6 mEq/L (ref 3.5–5.1)
Sodium: 137 mEq/L (ref 135–145)

## 2015-02-04 LAB — CBC WITH DIFFERENTIAL/PLATELET
BASOS ABS: 0.1 10*3/uL (ref 0.0–0.1)
Basophils Relative: 0.7 % (ref 0.0–3.0)
EOS ABS: 0.3 10*3/uL (ref 0.0–0.7)
Eosinophils Relative: 3.8 % (ref 0.0–5.0)
HCT: 43.4 % (ref 39.0–52.0)
Hemoglobin: 14.7 g/dL (ref 13.0–17.0)
LYMPHS PCT: 17 % (ref 12.0–46.0)
Lymphs Abs: 1.3 10*3/uL (ref 0.7–4.0)
MCHC: 33.9 g/dL (ref 30.0–36.0)
MCV: 96.8 fl (ref 78.0–100.0)
MONOS PCT: 12.4 % — AB (ref 3.0–12.0)
Monocytes Absolute: 0.9 10*3/uL (ref 0.1–1.0)
NEUTROS PCT: 66.1 % (ref 43.0–77.0)
Neutro Abs: 4.9 10*3/uL (ref 1.4–7.7)
PLATELETS: 207 10*3/uL (ref 150.0–400.0)
RBC: 4.49 Mil/uL (ref 4.22–5.81)
RDW: 13.7 % (ref 11.5–15.5)
WBC: 7.4 10*3/uL (ref 4.0–10.5)

## 2015-02-04 LAB — HEPATIC FUNCTION PANEL
ALT: 39 U/L (ref 0–53)
AST: 42 U/L — ABNORMAL HIGH (ref 0–37)
Albumin: 4.2 g/dL (ref 3.5–5.2)
Alkaline Phosphatase: 113 U/L (ref 39–117)
BILIRUBIN DIRECT: 0.2 mg/dL (ref 0.0–0.3)
Total Bilirubin: 0.9 mg/dL (ref 0.2–1.2)
Total Protein: 6.3 g/dL (ref 6.0–8.3)

## 2015-02-04 LAB — TSH: TSH: 2.03 u[IU]/mL (ref 0.35–4.50)

## 2015-02-04 LAB — POCT URINALYSIS DIPSTICK
Bilirubin, UA: NEGATIVE
Blood, UA: NEGATIVE
GLUCOSE UA: NEGATIVE
LEUKOCYTES UA: NEGATIVE
NITRITE UA: NEGATIVE
Protein, UA: NEGATIVE
Spec Grav, UA: 1.01
UROBILINOGEN UA: 0.2
pH, UA: 5

## 2015-02-04 LAB — PSA: PSA: 0.17 ng/mL (ref 0.10–4.00)

## 2015-02-04 NOTE — Progress Notes (Signed)
   Subjective:    Patient ID: Jeffrey Hudson, male    DOB: Sep 19, 1951, 64 y.o.   MRN: [id number]  HPI Jeffrey Hudson is a 64 year old male smoker,,,,,,, down to 10 cigarettes a day with the nicotine patch for the past 1 month,,,,, who comes in today after a long absence to discuss his health  He has a history of bipolar depression. He was placed on Prozac 20 mg daily along with Zyprexa 5 mg at bedtime. He initially was seen by [facility]. He does not have a psychiatrist. I will refer him to [facility] to get a psychiatrist  He also has a history of underlying coronary artery disease he had a heart attack in 2012 but continues to smoke. He saw Dr. Jearld Lesch his cardiologist 2 months ago. He tells me a stress test at that time was normal. He's on Lipitor 80 mg daily, aspirin, Toprol 25 mg daily and nitroglycerin when necessary  Because of tobacco abuse he also has underlying COPD and he takes albuterol 2 puffs twice a day when necessary. He says he rarely uses that.  He's not had a general physical examination many years  He's due a tetanus booster and a Pneumovax......... Both given today  He also needs a follow-up colonoscopy   Review of Systems    he has had a colonoscopy 10 years ago and is due for follow-up. Is not recall who did his colonoscopy Objective:   Physical Exam  Well-developed well-nourished male smells of tobacco vital signs stable BP 140/80 weight 162 pounds      Assessment & Plan:  Tobacco abuse......... continue nicotine patches and taper off as outlined  Bipolar depression......Marland Kitchen recommend he call [facility] to get his psychiatrist  Coronary artery disease........ normal stress test 2 months ago..... Continue current medications and follow-up by Dr. Jearld Lesch

## 2015-02-04 NOTE — Patient Instructions (Signed)
Continue current medications   call  [facility]......Marland Kitchen Make an appointment to see a psychiatrist for follow-up and your medication   set up a time in June or July to see our new adult nurse practitioner Beaulah Dinning    labs today,,,,,,,,,, we will call you the report

## 2015-02-12 ENCOUNTER — Ambulatory Visit: Payer: Medicare Other | Admitting: Pulmonary Disease

## 2015-02-25 DIAGNOSIS — F654 Pedophilia: Secondary | ICD-10-CM | POA: Diagnosis not present

## 2015-03-26 ENCOUNTER — Other Ambulatory Visit: Payer: Self-pay | Admitting: Cardiovascular Disease

## 2015-04-01 ENCOUNTER — Telehealth: Payer: Self-pay | Admitting: Family Medicine

## 2015-04-01 MED ORDER — ZOSTER VACCINE LIVE 19400 UNT/0.65ML ~~LOC~~ SOLR: 0.6500 mL | Freq: Once | SUBCUTANEOUS | Status: DC

## 2015-04-01 NOTE — Telephone Encounter (Signed)
Rx sent 

## 2015-04-01 NOTE — Telephone Encounter (Signed)
Patient called in to inquire about apptmt. Patient state that he needed to get his vaccine for shingles and wanted to know [facility] of coming [facility]. Patient will keep apptmt at this time but may call in to cancel and [facility] if that's an option for him.

## 2015-04-01 NOTE — Telephone Encounter (Signed)
Left message on machine for patient to return our call with which [facility] to [facility] to.

## 2015-04-02 MED ORDER — ZOSTER VACCINE LIVE 19400 UNT/0.65ML ~~LOC~~ SOLR: 0.6500 mL | Freq: Once | SUBCUTANEOUS | Status: DC

## 2015-04-02 NOTE — Telephone Encounter (Signed)
We are working from her basket. Thank you!

## 2015-04-02 NOTE — Telephone Encounter (Signed)
There are [facility] on [address]. Please clarify with one and send to [facility] inbasket

## 2015-04-02 NOTE — Telephone Encounter (Signed)
[facility] [place], [place]

## 2015-04-02 NOTE — Telephone Encounter (Signed)
Vaccine sent

## 2015-04-02 NOTE — Addendum Note (Signed)
Addended by: [place] Bumpers on: 04/02/2015 03:53 PM   Modules accepted: Orders

## 2015-04-02 NOTE — Telephone Encounter (Signed)
[facility] [address] states [facility] will do.  Can you send shingles shot  to them?

## 2015-04-04 DIAGNOSIS — F654 Pedophilia: Secondary | ICD-10-CM | POA: Diagnosis not present

## 2015-04-07 ENCOUNTER — Ambulatory Visit: Payer: Medicare Other | Admitting: Adult Health

## 2015-04-15 ENCOUNTER — Other Ambulatory Visit: Payer: Self-pay | Admitting: Family Medicine

## 2015-05-02 ENCOUNTER — Ambulatory Visit (HOSPITAL_COMMUNITY): Payer: Medicare Other | Admitting: Psychiatry

## 2015-05-13 ENCOUNTER — Encounter: Payer: Medicare Other | Admitting: Gastroenterology

## 2015-05-29 ENCOUNTER — Telehealth: Payer: Self-pay | Admitting: Cardiovascular Disease

## 2015-05-29 NOTE — Telephone Encounter (Signed)
Patient calling in with left-sided CP x2 days (constant, "aching", "like a soreness"). Denies pressure, dizziness or SOB. States he has discomfort to left neck too. Has not taken NTG. Had patient take his VS - BP 110/79, HR 75. Advised patient to take his NTG, as directed. After a few minutes patient states CP "a little bit better". Patient request that he be admitted tomorrow, as he has something scheduled for today "unless they need me to go today".  Reviewed by Richardson Dopp, PA. He advised patient to proceed to the ED for evaluation/treatment. Called patient to notify him of advisement - no answer. LM on VM. Attempted to call him again. He answered phone. Provided him with advisement to go to ED for evaluation/treatment for CP. Patient declined. Stated he was currently in [place]. Advised him that he could go to any nearest ED. Patient stated he understood our advisement but would wait until he was home tomorrow and then go to [facility] ED. Reiterated importance of seeking care or calling 911 so EMS could come check him out/do EKG/further advise him. Patient stated he understood. Routed to Dr. Angelena Form as Juluis Rainier.

## 2015-05-29 NOTE — Telephone Encounter (Signed)
New message      Pt c/o of Chest Pain: STAT if CP now or developed within 24 hours  1. Are you having CP right now? Yes---more on left side 2. Are you experiencing any other symptoms (ex. SOB, nausea, vomiting, sweating)? no 3. How long have you been experiencing CP?  2 days 4. Is your CP continuous or coming and going? continuous 5. Have you taken Nitroglycerin? no?

## 2015-05-29 NOTE — Telephone Encounter (Signed)
Thanks. Agree. cdm

## 2015-06-06 ENCOUNTER — Other Ambulatory Visit: Payer: Self-pay | Admitting: Pulmonary Disease

## 2015-06-06 DIAGNOSIS — J438 Other emphysema: Secondary | ICD-10-CM

## 2015-06-10 DIAGNOSIS — F654 Pedophilia: Secondary | ICD-10-CM | POA: Diagnosis not present

## 2015-06-16 ENCOUNTER — Telehealth: Payer: Self-pay | Admitting: Pulmonary Disease

## 2015-06-16 DIAGNOSIS — R911 Solitary pulmonary nodule: Secondary | ICD-10-CM

## 2015-06-16 NOTE — Telephone Encounter (Signed)
Spoke with Jeffrey Hudson with [employer].  LDCT Lung CA Screening needs peer to peer.  Can call # provided above, option # 3 for this.  Needs to be done TODAY asap.     Spoke with [facility].  She is aware of this and has given paperwork to Dr. Elsworth Soho regarding same.  Dr. Elsworth Soho, please advise.  Thank you.

## 2015-06-16 NOTE — Telephone Encounter (Signed)
The reason for CT is not screening but FU pulm nodules  - pl re order

## 2015-06-16 NOTE — Telephone Encounter (Signed)
Order entered for CT chest wo contrast for Pulmonary Nodule Please cancel LDCT Lung CA Screening and schedule new order  To [facility]

## 2015-06-17 NOTE — Telephone Encounter (Signed)
Ct chest changed to ct  wo  Contrast  Auth@[id number]  Joellen Jersey

## 2015-07-03 ENCOUNTER — Other Ambulatory Visit: Payer: Self-pay | Admitting: *Deleted

## 2015-07-03 MED ORDER — METOPROLOL SUCCINATE ER 25 MG PO TB24: ORAL_TABLET | ORAL | Status: DC

## 2015-07-07 ENCOUNTER — Ambulatory Visit (INDEPENDENT_AMBULATORY_CARE_PROVIDER_SITE_OTHER)
Admission: RE | Admit: 2015-07-07 | Discharge: 2015-07-07 | Disposition: A | Discharge status: Home / Self Care | Payer: Medicare Other | Source: Ambulatory Visit | Attending: Pulmonary Disease | Admitting: Pulmonary Disease

## 2015-07-07 DIAGNOSIS — R911 Solitary pulmonary nodule: Secondary | ICD-10-CM | POA: Diagnosis not present

## 2015-07-07 DIAGNOSIS — Z87891 Personal history of nicotine dependence: Secondary | ICD-10-CM | POA: Diagnosis not present

## 2015-07-08 ENCOUNTER — Telehealth: Payer: Self-pay | Admitting: Acute Care

## 2015-07-08 ENCOUNTER — Telehealth: Payer: Self-pay | Admitting: Pulmonary Disease

## 2015-07-08 NOTE — Telephone Encounter (Signed)
Ct scan results: Notes Recorded by Rigoberto Noel, MD on 07/07/2015 at 3:02 PM Unchanged tiny nodules -follow-up scan in 1 year for stability Needs routine oV with TP ---  I spoke with patient about results and he verbalized understanding and had no questions.  Pt did not want to schedule a follow up. He reports he just needs a 1 year follow up on CT next year and wants to wait until then.

## 2015-07-08 NOTE — Telephone Encounter (Signed)
The results of the LDCT were called by the pulmonary staff after review by Dr. Elsworth Soho. We will schedule this patient in 12 months for his follow up scan.

## 2015-08-06 ENCOUNTER — Ambulatory Visit: Payer: Medicare Other | Admitting: Adult Health

## 2015-08-21 ENCOUNTER — Other Ambulatory Visit: Payer: Self-pay | Admitting: Cardiovascular Disease

## 2015-08-27 DIAGNOSIS — F654 Pedophilia: Secondary | ICD-10-CM | POA: Diagnosis not present

## 2015-09-01 ENCOUNTER — Telehealth: Payer: Self-pay | Admitting: Family Medicine

## 2015-09-01 DIAGNOSIS — Z0000 Encounter for general adult medical examination without abnormal findings: Secondary | ICD-10-CM

## 2015-09-01 NOTE — Telephone Encounter (Signed)
Pt would like hep c blood work. Can I sch?

## 2015-09-01 NOTE — Telephone Encounter (Signed)
Pt has been sch

## 2015-09-01 NOTE — Telephone Encounter (Signed)
Lab ordered please schedule

## 2015-09-02 ENCOUNTER — Other Ambulatory Visit (INDEPENDENT_AMBULATORY_CARE_PROVIDER_SITE_OTHER): Payer: Medicare Other

## 2015-09-02 DIAGNOSIS — Z0000 Encounter for general adult medical examination without abnormal findings: Secondary | ICD-10-CM | POA: Diagnosis not present

## 2015-09-02 LAB — HEPATITIS C ANTIBODY: HCV AB: NEGATIVE

## 2015-10-16 ENCOUNTER — Other Ambulatory Visit: Payer: Self-pay | Admitting: *Deleted

## 2015-10-16 MED ORDER — METOPROLOL SUCCINATE ER 25 MG PO TB24: ORAL_TABLET | ORAL | Status: DC

## 2015-10-31 ENCOUNTER — Ambulatory Visit (INDEPENDENT_AMBULATORY_CARE_PROVIDER_SITE_OTHER): Payer: Medicare Other | Admitting: Psychiatry

## 2015-10-31 ENCOUNTER — Encounter (HOSPITAL_COMMUNITY): Payer: Self-pay | Admitting: Psychiatry

## 2015-10-31 VITALS — BP 145/85 | HR 66 | Wt 167.2 lb

## 2015-10-31 DIAGNOSIS — F319 Bipolar disorder, unspecified: Secondary | ICD-10-CM
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
## 2015-11-20 DIAGNOSIS — F654 Pedophilia: Secondary | ICD-10-CM | POA: Diagnosis not present

## 2015-12-23 ENCOUNTER — Ambulatory Visit (INDEPENDENT_AMBULATORY_CARE_PROVIDER_SITE_OTHER): Payer: Medicare Other | Admitting: Podiatry

## 2015-12-23 ENCOUNTER — Encounter: Payer: Self-pay | Admitting: Podiatry

## 2015-12-23 VITALS — BP 142/82 | HR 64 | Resp 12

## 2015-12-23 DIAGNOSIS — B351 Tinea unguium: Secondary | ICD-10-CM | POA: Diagnosis not present

## 2015-12-23 DIAGNOSIS — M79673 Pain in unspecified foot: Secondary | ICD-10-CM | POA: Diagnosis not present

## 2015-12-23 NOTE — Progress Notes (Signed)
   Subjective:    Patient ID: Jeffrey Hudson, male    DOB: 1951/03/04, 65 y.o.   MRN: [id number]  HPI   As patient presents today complaining of gradual thickening and elongated toenails which have become uncomfortable and difficult for patient to trim. He has attempted to trim the nails on a repetitive basis, however, he says he is not able to treatment toenails adequately at this time and is requesting nail debridement. He denies any previous podiatric care or visits to pedicurist.  He denies any history of foot ulceration, claudication or amputation  Review of Systems  Skin: Positive for color change.       Objective:   Physical Exam  Orientated 3  Vascular: No peripheral edema bilaterally DP right 2/4, DP left 0/4 PT pulses 4/4 bilaterally Capillary reflex delayed bilaterally  Neurological: Sensation to 10 g monofilament wire intact 5/5 bilaterally Vibratory sensation reactive bilaterally Ankle reflex equal and reactive bilaterally  Dermatological: No open skin lesions bilaterally The toenails are elongated, incurvated with occasional texture and color changes 6-10  Musculoskeletal: Pes planus bilaterally No restriction or crepitus on range of motion of ankle, subtalar, midtarsal joints bilaterally      Assessment & Plan:   Assessment: Absent DP pulse left Apparent satisfactory vascular status without history of open lesions Mycotic toenails  Plan: Today review the results examination with patient today made aware he had satisfactory neurovascular status. I offered him debridement of the toenails making wear that this would be noncovered service and patient verbally consents  The nails 6-10 were debrided mechanically Analogic without any bleeding I discussed possibility of patient visiting pedicurist avoiding whirlpool and manipulation a cuticles Also, demonstrated a large nail clipper that he might try to self trim his nails  Reappoint at patient's  request

## 2015-12-23 NOTE — Patient Instructions (Signed)
Today your examination revealed adequate pulsation and feeling in your right and left feet. Your toenails are elongated with occasional texture and color changes consistent with repetitive microtrauma or low-grade fungal infection Today I trim your toenails and suggested that follow-up for repetitive trimming could be added pedicurist. I recommended no whirlpool or no manipulation of the cuticles Also, I described using a nail clipper similar to what we used to trim your toenails today  Reappoint at your request

## 2016-01-07 ENCOUNTER — Other Ambulatory Visit: Payer: Self-pay | Admitting: Acute Care

## 2016-01-07 DIAGNOSIS — F17210 Nicotine dependence, cigarettes, uncomplicated: Secondary | ICD-10-CM

## 2016-01-08 NOTE — Progress Notes (Signed)
Chief Complaint  Patient presents with  . Follow-up  . Coronary Artery Disease    History of Present Illness: 65 yo male with history of CAD, COPD, tobacco abuse, HLD, bipolar disorder here today for cardiac follow up. He has been followed in the past by Dr. Lia Foyer. He initially presented with an anterolateral STEMI in 2009 and had a bare metal stent placed in the large Diagonal branch. Last cath April 2011 with mild plaque LAD, patent Diagonal stent with 50% restenosis, 50% mid Circumflex stenosis, 30% distal RCA stenosis, 60% focal PLA stenosis. LV function was normal. Last stress myoview April 2012 without ischemia, LVEF=64%.  He is here today for follow up. He denies chest pain, SOB, palpitations. He has stable exertional dyspnea due to COPD. Followed in [facility]. Still smoking less than 1ppd.   Primary Care Physician: Stevie Kern  Past Medical History  Diagnosis Date  . Nodule of left lung     6-mm smooothly rounded nodule  . Coronary artery disease   . AMI (acute myocardial infarction) ([place])     Acute ST elevation  . Hyperlipidemia   . Syncope and collapse     in 2001 and 2004 with no clear cause  . Tobacco abuse   . Bipolar disorder [facility])     Past Surgical History  Procedure Laterality Date  . Stent placed in heart      Current Outpatient Prescriptions  Medication Sig Dispense Refill  . albuterol (PROAIR HFA) 108 (90 BASE) MCG/ACT inhaler Inhale 2 puffs into the lungs every 6 (six) hours as needed. 1 Inhaler 0  . aspirin 81 MG tablet Take 81 mg by mouth daily.      Marland Kitchen atorvastatin (LIPITOR) 80 MG tablet Take 1 tablet (80 mg total) by mouth daily. 90 tablet 3  . FLUoxetine (PROZAC) 20 MG capsule Take 1 capsule by mouth  daily 90 capsule 1  . metoprolol succinate (TOPROL-XL) 25 MG 24 hr tablet Take 1 tablet by mouth  daily 90 tablet 3  . Multiple Vitamin (MULTIVITAMIN) tablet Take 1 tablet by mouth daily.      . nitroGLYCERIN (NITROSTAT) 0.4 MG SL tablet  PLACE 1 TABLET UNDER THE TONGUE EVERY 5 MINUTES AS NEEDED 25 tablet 6  . OLANZapine (ZYPREXA) 5 MG tablet Take 5 mg by mouth at bedtime.      . triamcinolone (KENALOG) 0.025 % cream Apply topically 2 (two) times daily.       No current facility-administered medications for this visit.    No Known Allergies  Social History   Social History  . Marital Status: Married    Spouse Name: N/A  . Number of Children: 1  . Years of Education: N/A   Occupational History  . Retired-Sales/broker    Social History Main Topics  . Smoking status: Current Every Day Smoker -- 1.00 packs/day for 45 years    Types: Cigarettes  . Smokeless tobacco: Not on file     Comment: 1 pack a day  . Alcohol Use: 0.6 oz/week    1 Standard drinks or equivalent per week     Comment: 2 glasses of wine daily  . Drug Use: No     Comment: marijuana quit 2010. He has hx of THC use.  Marland Kitchen Sexual Activity: Not on file   Other Topics Concern  . Not on file   Social History Narrative    Family History  Problem Relation Age of Onset  . Other Mother 30  died  . Alcohol abuse Father 90  . Depression Brother     Review of Systems:  As stated in the HPI and otherwise negative.   BP 140/82 mmHg  Pulse 56  Ht 5\' 6"  (1.676 m)  Wt 167 lb 6.4 oz (75.932 kg)  BMI 27.03 kg/m2  SpO2 96%  Physical Examination: General: Well developed, well nourished, NAD HEENT: OP clear, mucus membranes moist SKIN: warm, dry. No rashes. Neuro: No focal deficits Musculoskeletal: Muscle strength 5/5 all ext Psychiatric: Mood and affect normal Neck: No JVD, no carotid bruits, no thyromegaly, no lymphadenopathy. Lungs:Clear bilaterally, no wheezes, rhonci, crackles Cardiovascular: Regular rate and rhythm. No murmurs, gallops or rubs. Abdomen:Soft. Bowel sounds present. Non-tender.  Extremities: No lower extremity edema. Pulses are 2 + in the bilateral DP/PT.  EKG:  EKG is ordered today. The ekg ordered today demonstrates Sinus  brady, rate 56 bpm  Recent Labs: 02/04/2015: ALT 39; BUN 20; Creatinine, Ser 1.00; Hemoglobin 14.7; Platelets 207.0; Potassium 4.6; Sodium 137; TSH 2.03   Lipid Panel    Component Value Date/Time   CHOL 161 10/01/2014 0912   TRIG 161.0* 10/01/2014 0912   HDL 45.50 10/01/2014 0912   CHOLHDL 4 10/01/2014 0912   VLDL 32.2 10/01/2014 0912   LDLCALC 83 10/01/2014 0912     Wt Readings from Last 3 Encounters:  01/09/16 167 lb 6.4 oz (75.932 kg)  10/31/15 167 lb 3.2 oz (75.841 kg)  01/01/15 161 lb 6.4 oz (73.211 kg)     Other studies Reviewed: Additional studies/ records that were reviewed today include: . Review of the above records demonstrates:    Assessment and Plan:   1. CAD: Stable. Continue current medical therapy. He has had no ischemic testing for the last 5 years. Last cath in 2011 with moderate disease. Stress myoview March 2016 with no ischemia. EKG unchanged.   2. Tobacco abuse: Continues to smoke. 5 minutes spent on cessation counseling.   3. Hyperlipidemia: Continue statin. Repeat lipids now.     4. HTN: BP controlled. NO changes today.   Current medicines are reviewed at length with the patient today.  The patient does not have concerns regarding medicines.  The following changes have been made:  no change  Labs/ tests ordered today include:   Orders Placed This Encounter  Procedures  . Basic Metabolic Panel (BMET)  . Lipid Profile  . Hepatic function panel  . EKG 12-Lead   Disposition:   FU with me in 12  months  Signed, Lauree Chandler, MD 01/09/2016 9:29 AM    [address], [place], [place]  [address] Phone: [phone]; Fax: [phone]

## 2016-01-09 ENCOUNTER — Encounter: Payer: Self-pay | Admitting: Cardiovascular Disease

## 2016-01-09 ENCOUNTER — Ambulatory Visit (INDEPENDENT_AMBULATORY_CARE_PROVIDER_SITE_OTHER): Payer: Medicare Other | Admitting: Cardiovascular Disease

## 2016-01-09 VITALS — BP 140/82 | HR 56 | Ht 66.0 in | Wt 167.4 lb

## 2016-01-09 DIAGNOSIS — I10 Essential (primary) hypertension: Secondary | ICD-10-CM

## 2016-01-09 DIAGNOSIS — E785 Hyperlipidemia, unspecified: Secondary | ICD-10-CM

## 2016-01-09 DIAGNOSIS — F17200 Nicotine dependence, unspecified, uncomplicated: Secondary | ICD-10-CM

## 2016-01-09 DIAGNOSIS — I2510 Atherosclerotic heart disease of native coronary artery without angina pectoris: Secondary | ICD-10-CM | POA: Diagnosis not present

## 2016-01-09 MED ORDER — METOPROLOL SUCCINATE ER 25 MG PO TB24: ORAL_TABLET | ORAL | Status: DC

## 2016-01-09 NOTE — Patient Instructions (Signed)
Medication Instructions:  Your physician recommends that you continue on your current medications as directed. Please refer to the Current Medication list given to you today.   Labwork: Lab work to be done today--BMP, Lipid and liver profiles  Testing/Procedures: none  Follow-Up: Your physician wants you to follow-up in: 12 months.  You will receive a reminder letter in the mail two months in advance. If you don't receive a letter, please call our office to schedule the follow-up appointment.   Any Other Special Instructions Will Be Listed Below (If Applicable).     If you need a refill on your cardiac medications before your next appointment, please call your pharmacy.

## 2016-01-12 ENCOUNTER — Other Ambulatory Visit: Payer: Self-pay | Admitting: *Deleted

## 2016-01-12 DIAGNOSIS — E785 Hyperlipidemia, unspecified: Secondary | ICD-10-CM

## 2016-01-14 LAB — LIPID PANEL
Cholesterol: 192 mg/dL (ref 125–200)
HDL: 66 mg/dL (ref >=40)
LDL CALC: 87 mg/dL (ref <130)
Total CHOL/HDL Ratio: 2.9 Ratio (ref <=5.0)
Triglycerides: 196 mg/dL — ABNORMAL HIGH (ref <150)
VLDL: 39 mg/dL — AB (ref <30)

## 2016-01-14 LAB — HEPATIC FUNCTION PANEL
ALBUMIN: 3.8 g/dL (ref 3.6–5.1)
ALK PHOS: 87 U/L (ref 40–115)
ALT: 27 U/L (ref 9–46)
AST: 23 U/L (ref 10–35)
BILIRUBIN INDIRECT: 0.6 mg/dL (ref 0.2–1.2)
BILIRUBIN TOTAL: 0.8 mg/dL (ref 0.2–1.2)
Bilirubin, Direct: 0.2 mg/dL (ref <=0.2)
Total Protein: 6.3 g/dL (ref 6.1–8.1)

## 2016-01-14 LAB — BASIC METABOLIC PANEL
BUN: 19 mg/dL (ref 7–25)
CHLORIDE: 105 mmol/L (ref 98–110)
CO2: 27 mmol/L (ref 20–31)
CREATININE: 1.04 mg/dL (ref 0.70–1.25)
Calcium: 9.5 mg/dL (ref 8.6–10.3)
Glucose, Bld: 104 mg/dL — ABNORMAL HIGH (ref 65–99)
Potassium: 4.8 mmol/L (ref 3.5–5.3)
Sodium: 139 mmol/L (ref 135–146)

## 2016-01-15 ENCOUNTER — Other Ambulatory Visit: Payer: Self-pay

## 2016-01-29 ENCOUNTER — Ambulatory Visit (HOSPITAL_COMMUNITY): Payer: Self-pay | Admitting: Psychiatry

## 2016-02-03 ENCOUNTER — Other Ambulatory Visit: Payer: Self-pay | Admitting: Cardiovascular Disease

## 2016-02-10 ENCOUNTER — Telehealth: Payer: Self-pay | Admitting: Family Medicine

## 2016-02-10 NOTE — Telephone Encounter (Signed)
noted 

## 2016-02-10 NOTE — Telephone Encounter (Signed)
FYI

## 2016-02-10 NOTE — Telephone Encounter (Signed)
Patient Name: Jeffrey Hudson  DOB: 1950/11/08    Initial Comment caller states he has a odd looking mole in his groin area.   Nurse Assessment  Nurse: Raphael Gibney, RN, Vanita Ingles Date/Time (Eastern Time): 02/10/2016 12:39:46 PM  Confirm and document reason for call. If symptomatic, describe symptoms. You must click the next button to save text entered. ---Caller states he has odd looking mole between his groin and abd on the right side. Area is swollen and has been itching.  Has the patient traveled out of the country within the last 30 days? ---Not Applicable  Does the patient have any new or worsening symptoms? ---Yes  Will a triage be completed? ---Yes  Related visit to physician within the last 2 weeks? ---No  Does the PT have any chronic conditions? (i.e. diabetes, asthma, etc.) ---Yes  List chronic conditions. ---MI; HTN  Is this a behavioral health or substance abuse call? ---No     Guidelines    Guideline Title Affirmed Question Affirmed Notes  Skin Lesion - Moles or Growths [1] Skin growth or mole AND[2] larger than a pencil eraser, or increasing in size    Final Disposition User   See PCP within 2 Maudry Diego, RN, Vanita Ingles    Comments  Pt states he will make appt in the next week of 2 to see his dermatologist rather than seeing PCP.   Disagree/Comply: Comply

## 2016-02-20 DIAGNOSIS — L820 Inflamed seborrheic keratosis: Secondary | ICD-10-CM | POA: Diagnosis not present

## 2016-02-20 DIAGNOSIS — L280 Lichen simplex chronicus: Secondary | ICD-10-CM | POA: Diagnosis not present

## 2016-02-20 DIAGNOSIS — L738 Other specified follicular disorders: Secondary | ICD-10-CM | POA: Diagnosis not present

## 2016-02-20 DIAGNOSIS — L821 Other seborrheic keratosis: Secondary | ICD-10-CM | POA: Diagnosis not present

## 2016-02-20 DIAGNOSIS — Z85828 Personal history of other malignant neoplasm of skin: Secondary | ICD-10-CM | POA: Diagnosis not present

## 2016-05-04 DIAGNOSIS — F654 Pedophilia: Secondary | ICD-10-CM | POA: Diagnosis not present

## 2016-05-06 ENCOUNTER — Encounter: Payer: Self-pay | Admitting: Adult Health

## 2016-05-06 ENCOUNTER — Ambulatory Visit (INDEPENDENT_AMBULATORY_CARE_PROVIDER_SITE_OTHER): Payer: Medicare Other | Admitting: Adult Health

## 2016-05-06 VITALS — BP 138/64 | Temp 98.3°F | Ht 66.0 in | Wt 162.7 lb

## 2016-05-06 DIAGNOSIS — E785 Hyperlipidemia, unspecified: Secondary | ICD-10-CM | POA: Diagnosis not present

## 2016-05-06 DIAGNOSIS — Z7189 Other specified counseling: Secondary | ICD-10-CM | POA: Diagnosis not present

## 2016-05-06 DIAGNOSIS — H6123 Impacted cerumen, bilateral: Secondary | ICD-10-CM

## 2016-05-06 DIAGNOSIS — F17200 Nicotine dependence, unspecified, uncomplicated: Secondary | ICD-10-CM

## 2016-05-06 DIAGNOSIS — I10 Essential (primary) hypertension: Secondary | ICD-10-CM | POA: Diagnosis not present

## 2016-05-06 DIAGNOSIS — Z7689 Persons encountering health services in other specified circumstances: Secondary | ICD-10-CM

## 2016-05-06 LAB — POCT GLYCOSYLATED HEMOGLOBIN (HGB A1C): HEMOGLOBIN A1C: 5.9

## 2016-05-06 NOTE — Patient Instructions (Addendum)
It was great meeting you today  Please follow up with me for your physical. If you need anything in the meantime, please let me know  Work on quitting smoking   Steps to Quit Smoking  Smoking tobacco can be harmful to your health and can affect almost every organ in your body. Smoking puts you, and those around you, at risk for developing many serious chronic diseases. Quitting smoking is difficult, but it is one of the best things that you can do for your health. It is never too late to quit. WHAT ARE THE BENEFITS OF QUITTING SMOKING? When you quit smoking, you lower your risk of developing serious diseases and conditions, such as:  Lung cancer or lung disease, such as COPD.  Heart disease.  Stroke.  Heart attack.  Infertility.  Osteoporosis and bone fractures. Additionally, symptoms such as coughing, wheezing, and shortness of breath may get better when you quit. You may also find that you get sick less often because your body is stronger at fighting off colds and infections. If you are pregnant, quitting smoking can help to reduce your chances of having a baby of low birth weight. HOW DO I GET READY TO QUIT? When you decide to quit smoking, create a plan to make sure that you are successful. Before you quit:  Pick a date to quit. Set a date within the next two weeks to give you time to prepare.  Write down the reasons why you are quitting. Keep this list in places where you will see it often, such as on your bathroom mirror or in your car or wallet.  Identify the people, places, things, and activities that make you want to smoke (triggers) and avoid them. Make sure to take these actions:  Throw away all cigarettes at home, at work, and in your car.  Throw away smoking accessories, such as Scientist, research (medical).  Clean your car and make sure to empty the ashtray.  Clean your home, including curtains and carpets.  Tell your family, friends, and coworkers that you are  quitting. Support from your loved ones can make quitting easier.  Talk with your health care provider about your options for quitting smoking.  Find out what treatment options are covered by your health insurance. WHAT STRATEGIES CAN I USE TO QUIT SMOKING?  Talk with your healthcare provider about different strategies to quit smoking. Some strategies include:  Quitting smoking altogether instead of gradually lessening how much you smoke over a period of time. Research shows that quitting "cold [place]" is more successful than gradually quitting.  Attending in-person counseling to help you build problem-solving skills. You are more likely to have success in quitting if you attend several counseling sessions. Even short sessions of 10 minutes can be effective.  Finding resources and support systems that can help you to quit smoking and remain smoke-free after you quit. These resources are most helpful when you use them often. They can include:  Online chats with a Social worker.  Telephone quitlines.  Printed Furniture conservator/restorer.  Support groups or group counseling.  Text messaging programs.  Mobile phone applications.  Taking medicines to help you quit smoking. (If you are pregnant or breastfeeding, talk with your health care provider first.) Some medicines contain nicotine and some do not. Both types of medicines help with cravings, but the medicines that include nicotine help to relieve withdrawal symptoms. Your health care provider may recommend:  Nicotine patches, gum, or lozenges.  Nicotine inhalers or sprays.  Non-nicotine medicine that is taken by mouth. Talk with your health care provider about combining strategies, such as taking medicines while you are also receiving in-person counseling. Using these two strategies together makes you more likely to succeed in quitting than if you used either strategy on its own. If you are pregnant or breastfeeding, talk with your health care  provider about finding counseling or other support strategies to quit smoking. Do not take medicine to help you quit smoking unless told to do so by your health care provider. WHAT THINGS CAN I DO TO MAKE IT EASIER TO QUIT? Quitting smoking might feel overwhelming at first, but there is a lot that you can do to make it easier. Take these important actions:  Reach out to your family and friends and ask that they support and encourage you during this time. Call telephone quitlines, reach out to support groups, or work with a counselor for support.  Ask people who smoke to avoid smoking around you.  Avoid places that trigger you to smoke, such as bars, parties, or smoke-break areas at work.  Spend time around people who do not smoke.  Lessen stress in your life, because stress can be a smoking trigger for some people. To lessen stress, try:  Exercising regularly.  Deep-breathing exercises.  Yoga.  Meditating.  Performing a body scan. This involves closing your eyes, scanning your body from head to toe, and noticing which parts of your body are particularly tense. Purposefully relax the muscles in those areas.  Download or purchase mobile phone or tablet apps (applications) that can help you stick to your quit plan by providing reminders, tips, and encouragement. There are many free apps, such as QuitGuide from the [employer] Office manager for Disease Control and Prevention). You can find other support for quitting smoking (smoking cessation) through smokefree.gov and other websites. HOW WILL I FEEL WHEN I QUIT SMOKING? Within the first 24 hours of quitting smoking, you may start to feel some withdrawal symptoms. These symptoms are usually most noticeable 2-3 days after quitting, but they usually do not last beyond 2-3 weeks. Changes or symptoms that you might experience include:  Mood swings.  Restlessness, anxiety, or irritation.  Difficulty concentrating.  Dizziness.  Strong cravings for  sugary foods in addition to nicotine.  Mild weight gain.  Constipation.  Nausea.  Coughing or a sore throat.  Changes in how your medicines work in your body.  A depressed mood.  Difficulty sleeping (insomnia). After the first 2-3 weeks of quitting, you may start to notice more positive results, such as:  Improved sense of smell and taste.  Decreased coughing and sore throat.  Slower heart rate.  Lower blood pressure.  Clearer skin.  The ability to breathe more easily.  Fewer sick days. Quitting smoking is very challenging for most people. Do not get discouraged if you are not successful the first time. Some people need to make many attempts to quit before they achieve long-term success. Do your best to stick to your quit plan, and talk with your health care provider if you have any questions or concerns.   This information is not intended to replace advice given to you by your health care provider. Make sure you discuss any questions you have with your health care provider.   Document Released: 09/28/2001 Document Revised: 02/18/2015 Document Reviewed: 02/18/2015 Elsevier Interactive Patient Education [employer].

## 2016-05-06 NOTE — Addendum Note (Signed)
Addended by: Sandria Bales B on: 05/06/2016 10:02 AM   Modules accepted: Orders

## 2016-05-06 NOTE — Progress Notes (Signed)
Patient presents to clinic today to establish care. He is a pleasant 65 year old male who  has a past medical history of Nodule of left lung; Coronary artery disease; AMI (acute myocardial infarction) ([place]); Hyperlipidemia; Syncope and collapse; Tobacco abuse; and Bipolar disorder ([place]).  His last physical was " forever ago"   Acute Concerns: Establish Care  Chronic Issues:  BiPolar He has a history of bipolar depression. He was placed on Prozac 20 mg daily along with Zyprexa 5 mg at bedtime. He initially was seen by [facility] facility. He does have a psychiatrist at [facility]  Coronary Artery Disease He also has a history of underlying coronary artery disease he had a heart attack in 2012 but continues to smoke. He saw Dr. Angelena Form his cardiologist 4 months ago. He's on Lipitor 80 mg daily, aspirin, Toprol 25 mg daily and nitroglycerin when necessary  Hypertension  - Feels as though this is well controlled.   Health Maintenance: Dental --Dentures Vision -- He does not go Immunizations -- Needs Pneumovax 23 Colonoscopy -- Did cologuard 9 do not have results) Reported normal   Diet: Endorses eating healthy  Exercise: Walks every evening, his pace is fast enough for him to feel SOB He is followed by:   Cardiology- Dr. Angelena Form  Psychiatry - Beverly Sessions  Past Medical History  Diagnosis Date  . Nodule of left lung     6-mm smooothly rounded nodule  . Coronary artery disease   . AMI (acute myocardial infarction) ([place])     Acute ST elevation  . Hyperlipidemia   . Syncope and collapse     in 2001 and 2004 with no clear cause  . Tobacco abuse   . Bipolar disorder [facility])     Past Surgical History  Procedure Laterality Date  . Stent placed in heart      Current Outpatient Prescriptions on File Prior to Visit  Medication Sig Dispense Refill  . albuterol (PROAIR HFA) 108 (90 BASE) MCG/ACT inhaler Inhale 2 puffs into the lungs every 6 (six)  hours as needed. 1 Inhaler 0  . aspirin 81 MG tablet Take 81 mg by mouth daily.      Marland Kitchen atorvastatin (LIPITOR) 80 MG tablet Take 1 tablet by mouth  daily 90 tablet 3  . FLUoxetine (PROZAC) 20 MG capsule Take 1 capsule by mouth  daily 90 capsule 1  . metoprolol succinate (TOPROL-XL) 25 MG 24 hr tablet Take 1 tablet by mouth  daily 90 tablet 3  . Multiple Vitamin (MULTIVITAMIN) tablet Take 1 tablet by mouth daily.      . nitroGLYCERIN (NITROSTAT) 0.4 MG SL tablet PLACE 1 TABLET UNDER THE TONGUE EVERY 5 MINUTES AS NEEDED 25 tablet 6  . OLANZapine (ZYPREXA) 5 MG tablet Take 5 mg by mouth at bedtime.      . triamcinolone (KENALOG) 0.025 % cream Apply topically 2 (two) times daily. Reported on 05/06/2016     No current facility-administered medications on file prior to visit.    No Known Allergies  Family History  Problem Relation Age of Onset  . Other Mother 51    died  . Alcohol abuse Father 66  . Depression Brother     Social History   Social History  . Marital Status: Married    Spouse Name: N/A  . Number of Children: 1  . Years of Education: N/A   Occupational History  . Retired-Sales/broker    Social History Main Topics  .  Smoking status: Current Every Day Smoker -- 1.00 packs/day for 45 years    Types: Cigarettes  . Smokeless tobacco: Not on file     Comment: 1 pack a day  . Alcohol Use: 0.6 oz/week    1 Standard drinks or equivalent per week     Comment: 2 glasses of wine daily  . Drug Use: No     Comment: marijuana quit 2010. He has hx of THC use.  Marland Kitchen Sexual Activity: Not on file   Other Topics Concern  . Not on file   Social History Narrative    Review of Systems  Constitutional: Negative.   Cardiovascular: Negative.   Gastrointestinal: Negative.   Genitourinary: Negative.   Neurological: Negative.     BP 138/64 mmHg  Temp(Src) 98.3 F (36.8 C) (Oral)  Ht 5\' 6"  (1.676 m)  Wt 162 lb 11.2 oz (73.8 kg)  BMI 26.27 kg/m2  Physical Exam  Constitutional:  He is oriented to person, place, and time and well-developed, well-nourished, and in no distress. No distress.  HENT:  Head: Normocephalic and atraumatic.  Right Ear: External ear normal.  Left Ear: External ear normal.  Nose: Nose normal.  Mouth/Throat: Oropharynx is clear and moist. No oropharyngeal exudate.  Cerumen impaction in bilateral ears.   Eyes: Conjunctivae and EOM are normal. Pupils are equal, round, and reactive to light. Right eye exhibits no discharge.  Neck: Normal range of motion. Neck supple. No thyromegaly present.  Cardiovascular: Normal rate, regular rhythm, normal heart sounds and intact distal pulses.  Exam reveals no gallop and no friction rub.   No murmur heard. Pulmonary/Chest: Effort normal and breath sounds normal. No respiratory distress. He has no wheezes. He has no rales. He exhibits no tenderness.  Lymphadenopathy:    He has no cervical adenopathy.  Neurological: He is alert and oriented to person, place, and time. Gait normal. GCS score is 15.  Skin: Skin is warm and dry. No rash noted. He is not diaphoretic. No erythema. No pallor.  Psychiatric: Mood, memory, affect and judgment normal.  Nursing note and vitals reviewed.   No results found for this or any previous visit (from the past 2160 hour(s)).  Assessment/Plan:  1. Encounter to establish care - Follow up for CPE - Follow up sooner with any acute issues - Continue to eat healthy and exercise  2. Hyperlipidemia - Continue with current dose of Lipitor - Will check lipid panel at physical   3. TOBACCO ABUSE - Spent 5 minutes on quitting smoking - He is going to try the patch   4. Essential hypertension - Near goal -- No change in medications at this time   5. Cerumen impaction, bilateral - Cerumen easily removed in bilateral ears. He endorsed feeling as though he could hear better.   Dorothyann Peng, NP

## 2016-07-08 ENCOUNTER — Ambulatory Visit (INDEPENDENT_AMBULATORY_CARE_PROVIDER_SITE_OTHER)
Admission: RE | Admit: 2016-07-08 | Discharge: 2016-07-08 | Disposition: A | Discharge status: Home / Self Care | Payer: Medicare Other | Source: Ambulatory Visit | Attending: Acute Care | Admitting: Acute Care

## 2016-07-08 DIAGNOSIS — F17210 Nicotine dependence, cigarettes, uncomplicated: Secondary | ICD-10-CM

## 2016-07-08 DIAGNOSIS — Z87891 Personal history of nicotine dependence: Secondary | ICD-10-CM | POA: Diagnosis not present

## 2016-07-14 ENCOUNTER — Telehealth: Payer: Self-pay | Admitting: Acute Care

## 2016-07-14 DIAGNOSIS — F17210 Nicotine dependence, cigarettes, uncomplicated: Secondary | ICD-10-CM

## 2016-07-14 NOTE — Telephone Encounter (Signed)
I attempted to call the results of Jeffrey Hudson's low-dose CT scan. There was no answer. I have left a message and contact number requesting that he give the office a call for the results of his scan. We will await his return call.

## 2016-08-06 ENCOUNTER — Encounter: Payer: Self-pay | Admitting: Adult Health

## 2016-08-06 ENCOUNTER — Ambulatory Visit (INDEPENDENT_AMBULATORY_CARE_PROVIDER_SITE_OTHER): Payer: Medicare Other | Admitting: Adult Health

## 2016-08-06 VITALS — BP 142/80 | Temp 98.8°F | Ht 66.0 in | Wt 160.9 lb

## 2016-08-06 DIAGNOSIS — F17200 Nicotine dependence, unspecified, uncomplicated: Secondary | ICD-10-CM

## 2016-08-06 DIAGNOSIS — Z125 Encounter for screening for malignant neoplasm of prostate: Secondary | ICD-10-CM

## 2016-08-06 DIAGNOSIS — J432 Centrilobular emphysema: Secondary | ICD-10-CM | POA: Diagnosis not present

## 2016-08-06 DIAGNOSIS — I10 Essential (primary) hypertension: Secondary | ICD-10-CM

## 2016-08-06 DIAGNOSIS — Z0001 Encounter for general adult medical examination with abnormal findings: Secondary | ICD-10-CM | POA: Diagnosis not present

## 2016-08-06 DIAGNOSIS — Z23 Encounter for immunization: Secondary | ICD-10-CM | POA: Diagnosis not present

## 2016-08-06 DIAGNOSIS — F17210 Nicotine dependence, cigarettes, uncomplicated: Secondary | ICD-10-CM | POA: Diagnosis not present

## 2016-08-06 DIAGNOSIS — E785 Hyperlipidemia, unspecified: Secondary | ICD-10-CM

## 2016-08-06 DIAGNOSIS — Z0000 Encounter for general adult medical examination without abnormal findings: Secondary | ICD-10-CM

## 2016-08-06 LAB — POC URINALSYSI DIPSTICK (AUTOMATED)
Blood, UA: NEGATIVE
GLUCOSE UA: NEGATIVE
Leukocytes, UA: NEGATIVE
Nitrite, UA: NEGATIVE
Urobilinogen, UA: 1
pH, UA: 5.5

## 2016-08-06 LAB — CBC WITH DIFFERENTIAL/PLATELET
BASOS ABS: 0.1 10*3/uL (ref 0.0–0.1)
Basophils Relative: 1 % (ref 0.0–3.0)
EOS ABS: 0.2 10*3/uL (ref 0.0–0.7)
Eosinophils Relative: 3 % (ref 0.0–5.0)
HEMATOCRIT: 42.9 % (ref 39.0–52.0)
HEMOGLOBIN: 14.4 g/dL (ref 13.0–17.0)
LYMPHS PCT: 10.2 % — AB (ref 12.0–46.0)
Lymphs Abs: 0.8 10*3/uL (ref 0.7–4.0)
MCHC: 33.6 g/dL (ref 30.0–36.0)
MCV: 98.9 fl (ref 78.0–100.0)
MONOS PCT: 11.9 % (ref 3.0–12.0)
Monocytes Absolute: 0.9 10*3/uL (ref 0.1–1.0)
NEUTROS ABS: 5.5 10*3/uL (ref 1.4–7.7)
Neutrophils Relative %: 73.9 % (ref 43.0–77.0)
PLATELETS: 218 10*3/uL (ref 150.0–400.0)
RBC: 4.34 Mil/uL (ref 4.22–5.81)
RDW: 13.8 % (ref 11.5–15.5)
WBC: 7.4 10*3/uL (ref 4.0–10.5)

## 2016-08-06 LAB — LIPID PANEL
CHOL/HDL RATIO: 2
Cholesterol: 146 mg/dL (ref 0–200)
HDL: 68.4 mg/dL (ref >39.00)
LDL CALC: 59 mg/dL (ref 0–99)
NONHDL: 77.85
TRIGLYCERIDES: 96 mg/dL (ref 0.0–149.0)
VLDL: 19.2 mg/dL (ref 0.0–40.0)

## 2016-08-06 LAB — BASIC METABOLIC PANEL
BUN: 16 mg/dL (ref 6–23)
CALCIUM: 9.7 mg/dL (ref 8.4–10.5)
CO2: 28 meq/L (ref 19–32)
CREATININE: 0.92 mg/dL (ref 0.40–1.50)
Chloride: 104 mEq/L (ref 96–112)
GFR: 87.26 mL/min (ref >60.00)
Glucose, Bld: 105 mg/dL — ABNORMAL HIGH (ref 70–99)
Potassium: 4 mEq/L (ref 3.5–5.1)
SODIUM: 142 meq/L (ref 135–145)

## 2016-08-06 LAB — HEPATIC FUNCTION PANEL
ALK PHOS: 138 U/L — AB (ref 39–117)
ALT: 59 U/L — AB (ref 0–53)
AST: 73 U/L — AB (ref 0–37)
Albumin: 4.2 g/dL (ref 3.5–5.2)
BILIRUBIN DIRECT: 0.2 mg/dL (ref 0.0–0.3)
BILIRUBIN TOTAL: 0.9 mg/dL (ref 0.2–1.2)
TOTAL PROTEIN: 6.5 g/dL (ref 6.0–8.3)

## 2016-08-06 LAB — PSA: PSA: 0.17 ng/mL (ref 0.10–4.00)

## 2016-08-06 LAB — TSH: TSH: 1.79 u[IU]/mL (ref 0.35–4.50)

## 2016-08-06 LAB — HEMOGLOBIN A1C: Hgb A1c MFr Bld: 6 % (ref 4.6–6.5)

## 2016-08-06 MED ORDER — LISINOPRIL 5 MG PO TABS: 5.0000 mg | ORAL_TABLET | Freq: Every day | ORAL | 3 refills | Status: DC

## 2016-08-06 NOTE — Progress Notes (Signed)
Subjective:    Patient ID: Jeffrey Hudson, male    DOB: [birth year], 65 y.o.   MRN: [id number]  HPI Patient presents for yearly preventative medicine examination. He is a pleasant 65 year old male who  has a past medical history of AMI (acute myocardial infarction); Bipolar disorder ([place]); COPD (chronic obstructive pulmonary disease) ([place]); Coronary artery disease; Hyperlipidemia; Hypertension; Nodule of left lung; Syncope and collapse; and Tobacco abuse.  All immunizations and health maintenance protocols were reviewed with the patient and needed orders were placed.  Appropriate screening laboratory values were ordered for the patient including screening of hyperlipidemia, renal function and hepatic function. If indicated by BPH, a PSA was ordered.  Medication reconciliation,  past medical history, social history, problem list and allergies were reviewed in detail with the patient  Goals were established with regard to weight loss, exercise, and  diet in compliance with medications  End of life planning was discussed.  Recent low dose CT of chest revealed IMPRESSION: 1. Lung-RADS Category 2, benign appearance or behavior. Continue annual screening with low-dose chest CT without contrast in 12 months. 2. Emphysema. 3. Coronary artery atherosclerosis. 4. Diffusely decreased attenuation in the right liver suggest geographic fatty deposition with a relative sparing of the left hepatic lobes.  We had discussed the patch at his last visit to help him quit smoking. He has not started this yet.   Blood pressure has been consistently 140/80's at home. He is currently taking Metoprolol 50mg    Review of Systems  Constitutional: Negative.   HENT: Negative.   Eyes: Negative.   Respiratory: Negative.   Cardiovascular: Negative.   Gastrointestinal: Negative.   Endocrine: Negative.   Genitourinary: Negative.   Musculoskeletal: Negative.   Skin: Negative.   Allergic/Immunologic:  Negative.   Neurological: Negative.   Hematological: Negative.   All other systems reviewed and are negative.  Past Medical History:  Diagnosis Date  . AMI (acute myocardial infarction)    Acute ST elevation  . Bipolar disorder ([place])   . COPD (chronic obstructive pulmonary disease) ([place])   . Coronary artery disease   . Hyperlipidemia   . Hypertension   . Nodule of left lung    6-mm smooothly rounded nodule  . Syncope and collapse    in 2001 and 2004 with no clear cause  . Tobacco abuse     Social History   Social History  . Marital status: Married    Spouse name: N/A  . Number of children: 1  . Years of education: N/A   Occupational History  . Retired-Sales/broker    Social History Main Topics  . Smoking status: Current Every Day Smoker    Packs/day: 1.00    Years: 45.00    Types: Cigarettes  . Smokeless tobacco: Not on file     Comment: 1 pack a day  . Alcohol use 0.6 oz/week    1 Standard drinks or equivalent per week     Comment: 2 glasses of wine daily  . Drug use: No     Comment: marijuana quit 2010. He has hx of THC use.  Marland Kitchen Sexual activity: Not on file   Other Topics Concern  . Not on file   Social History Narrative   He is not working   He has been married for 35 years    One child (son)       He likes to [employer] - plays once a week     Past Surgical History:  Procedure Laterality Date  . stent placed in heart      Family History  Problem Relation Age of Onset  . Dementia Mother 64    died  . Alcohol abuse Father 72  . Depression Brother     No Known Allergies  Current Outpatient Prescriptions on File Prior to Visit  Medication Sig Dispense Refill  . albuterol (PROAIR HFA) 108 (90 BASE) MCG/ACT inhaler Inhale 2 puffs into the lungs every 6 (six) hours as needed. 1 Inhaler 0  . aspirin 81 MG tablet Take 81 mg by mouth daily.      Marland Kitchen atorvastatin (LIPITOR) 80 MG tablet Take 1 tablet by mouth  daily 90 tablet 3  . FLUoxetine (PROZAC) 20 MG  capsule Take 1 capsule by mouth  daily 90 capsule 1  . metoprolol succinate (TOPROL-XL) 25 MG 24 hr tablet Take 1 tablet by mouth  daily 90 tablet 3  . Multiple Vitamin (MULTIVITAMIN) tablet Take 1 tablet by mouth daily.      . nitroGLYCERIN (NITROSTAT) 0.4 MG SL tablet PLACE 1 TABLET UNDER THE TONGUE EVERY 5 MINUTES AS NEEDED 25 tablet 6  . OLANZapine (ZYPREXA) 5 MG tablet Take 5 mg by mouth at bedtime.      . triamcinolone (KENALOG) 0.025 % cream Apply topically 2 (two) times daily. Reported on 05/06/2016     No current facility-administered medications on file prior to visit.     BP (!) 142/80   Temp 98.8 F (37.1 C) (Oral)   Ht 5\' 6"  (1.676 m)   Wt 160 lb 14.4 oz (73 kg)   BMI 25.97 kg/m       Objective:   Physical Exam  Constitutional: He is oriented to person, place, and time. He appears well-developed and well-nourished. No distress.  HENT:  Head: Normocephalic and atraumatic.  Right Ear: External ear normal.  Left Ear: External ear normal.  Nose: Nose normal.  Mouth/Throat: Oropharynx is clear and moist. No oropharyngeal exudate.  Eyes: Conjunctivae and EOM are normal. Pupils are equal, round, and reactive to light. Right eye exhibits no discharge. Left eye exhibits no discharge. No scleral icterus.  Neck: Carotid bruit is not present. No tracheal deviation present. No thyroid mass and no thyromegaly present.  Cardiovascular: Normal rate, regular rhythm, normal heart sounds and intact distal pulses.  Exam reveals no gallop and no friction rub.   No murmur heard. Pulmonary/Chest: Effort normal and breath sounds normal. No stridor. No respiratory distress. He has no wheezes. He has no rales. He exhibits no tenderness.  Scattered wheezes throughout  Abdominal: Soft. Bowel sounds are normal. He exhibits no distension and no mass. There is no tenderness. There is no rebound and no guarding.  Genitourinary: Rectum normal and prostate normal. Rectal exam shows guaiac negative  stool.  Musculoskeletal: Normal range of motion. He exhibits no edema, tenderness or deformity.  Lymphadenopathy:    He has no cervical adenopathy.  Neurological: He is alert and oriented to person, place, and time. He has normal reflexes. He displays normal reflexes. No cranial nerve deficit. He exhibits normal muscle tone. Coordination normal.  Skin: Skin is warm and dry. No rash noted. He is not diaphoretic. No erythema. No pallor.  Psychiatric: He has a normal mood and affect. His behavior is normal. Judgment and thought content normal.  Nursing note and vitals reviewed.     Assessment & Plan:  1. Routine general medical examination at a health care facility - POCT Urinalysis Dipstick (Automated) -  PSA - Basic metabolic panel - CBC with Differential/Platelet - Hemoglobin A1c - Hepatic function panel - Lipid panel - TSH - Continue to eat healthy and exercise - Follow up in 6 months  - He needs to quit smoking   2. Essential hypertension - Not at goal. Will add 5 mg lisinopril. He is going to continue to monitor at home and inform me of his BP's - lisinopril (PRINIVIL,ZESTRIL) 5 MG tablet; Take 1 tablet (5 mg total) by mouth daily.  Dispense: 90 tablet; Refill: 3 - POCT Urinalysis Dipstick (Automated) - PSA - Basic metabolic panel - CBC with Differential/Platelet - Hemoglobin A1c - Hepatic function panel - Lipid panel - TSH  3. Centrilobular emphysema ([place]) - Needs to quit smoking   4. TOBACCO ABUSE - We spent about 5 minutes of the visit talking about different options to help him quit smoking. He is going to try the patch  - Follow up in 6 months   5. Hyperlipidemia, unspecified hyperlipidemia type - Continue Lipitor  - Continue to eat healthy and exercise - POCT Urinalysis Dipstick (Automated) - PSA - Basic metabolic panel - CBC with Differential/Platelet - Hemoglobin A1c - Hepatic function panel - Lipid panel - TSH  Dorothyann Peng, NP

## 2016-08-06 NOTE — Patient Instructions (Addendum)
It was great seeing you today!  I will follow up with you on your labs.   Most recent CT showed the beginning stages of Emphasema. .. It is very important to stop smoking.   I have called in lisinopril 5 mg to help with your blood pressure.   Continue to diet and exercise  Follow up in 6 months   Health Maintenance, Male A healthy lifestyle and preventative care can promote health and wellness.  Maintain regular health, dental, and eye exams.  Eat a healthy diet. Foods like vegetables, fruits, whole grains, low-fat dairy products, and lean protein foods contain the nutrients you need and are low in calories. Decrease your intake of foods high in solid fats, added sugars, and salt. Get information about a proper diet from your health care provider, if necessary.  Regular physical exercise is one of the most important things you can do for your health. Most adults should get at least 150 minutes of moderate-intensity exercise (any activity that increases your heart rate and causes you to sweat) each week. In addition, most adults need muscle-strengthening exercises on 2 or more days a week.   Maintain a healthy weight. The body mass index (BMI) is a screening tool to identify possible weight problems. It provides an estimate of body fat based on height and weight. Your health care provider can find your BMI and can help you achieve or maintain a healthy weight. For males 20 years and older:  A BMI below 18.5 is considered underweight.  A BMI of 18.5 to 24.9 is normal.  A BMI of 25 to 29.9 is considered overweight.  A BMI of 30 and above is considered obese.  Maintain normal blood lipids and cholesterol by exercising and minimizing your intake of saturated fat. Eat a balanced diet with plenty of fruits and vegetables. Blood tests for lipids and cholesterol should begin at age 110 and be repeated every 5 years. If your lipid or cholesterol levels are high, you are over age 62, or you are  at high risk for heart disease, you may need your cholesterol levels checked more frequently.Ongoing high lipid and cholesterol levels should be treated with medicines if diet and exercise are not working.  If you smoke, find out from your health care provider how to quit. If you do not use tobacco, do not start.  Lung cancer screening is recommended for adults aged 30-80 years who are at high risk for developing lung cancer because of a history of smoking. A yearly low-dose CT scan of the lungs is recommended for people who have at least a 30-pack-year history of smoking and are current smokers or have quit within the past 15 years. A pack year of smoking is smoking an average of 1 pack of cigarettes a day for 1 year (for example, a 30-pack-year history of smoking could mean smoking 1 pack a day for 30 years or 2 packs a day for 15 years). Yearly screening should continue until the smoker has stopped smoking for at least 15 years. Yearly screening should be stopped for people who develop a health problem that would prevent them from having lung cancer treatment.  If you choose to drink alcohol, do not have more than 2 drinks per day. One drink is considered to be 12 oz (360 mL) of beer, 5 oz (150 mL) of wine, or 1.5 oz (45 mL) of liquor.  Avoid the use of street drugs. Do not share needles with anyone. Ask for  help if you need support or instructions about stopping the use of drugs.  High blood pressure causes heart disease and increases the risk of stroke. High blood pressure is more likely to develop in:  People who have blood pressure in the end of the normal range (100-139/85-89 mm Hg).  People who are overweight or obese.  People who are African American.  If you are 68-70 years of age, have your blood pressure checked every 3-5 years. If you are 58 years of age or older, have your blood pressure checked every year. You should have your blood pressure measured twice--once when you are at a  hospital or clinic, and once when you are not at a hospital or clinic. Record the average of the two measurements. To check your blood pressure when you are not at a hospital or clinic, you can use:  An automated blood pressure machine at a pharmacy.  A home blood pressure monitor.  If you are 45-73 years old, ask your health care provider if you should take aspirin to prevent heart disease.  Diabetes screening involves taking a blood sample to check your fasting blood sugar level. This should be done once every 3 years after age 57 if you are at a normal weight and without risk factors for diabetes. Testing should be considered at a younger age or be carried out more frequently if you are overweight and have at least 1 risk factor for diabetes.  Colorectal cancer can be detected and often prevented. Most routine colorectal cancer screening begins at the age of 60 and continues through age 75. However, your health care provider may recommend screening at an earlier age if you have risk factors for colon cancer. On a yearly basis, your health care provider may provide home test kits to check for hidden blood in the stool. A small camera at the end of a tube may be used to directly examine the colon (sigmoidoscopy or colonoscopy) to detect the earliest forms of colorectal cancer. Talk to your health care provider about this at age 63 when routine screening begins. A direct exam of the colon should be repeated every 5-10 years through age 23, unless early forms of precancerous polyps or small growths are found.  People who are at an increased risk for hepatitis B should be screened for this virus. You are considered at high risk for hepatitis B if:  You were born in a country where hepatitis B occurs often. Talk with your health care provider about which countries are considered high risk.  Your parents were born in a high-risk country and you have not received a shot to protect against hepatitis B  (hepatitis B vaccine).  You have HIV or AIDS.  You use needles to inject street drugs.  You live with, or have sex with, someone who has hepatitis B.  You are a man who has sex with other men (MSM).  You get hemodialysis treatment.  You take certain medicines for conditions like cancer, organ transplantation, and autoimmune conditions.  Hepatitis C blood testing is recommended for all people born from [birth year] through 1967 and any individual with known risk factors for hepatitis C.  Healthy men should no longer receive prostate-specific antigen (PSA) blood tests as part of routine cancer screening. Talk to your health care provider about prostate cancer screening.  Testicular cancer screening is not recommended for adolescents or adult males who have no symptoms. Screening includes self-exam, a health care provider exam, and other screening  tests. Consult with your health care provider about any symptoms you have or any concerns you have about testicular cancer.  Practice safe sex. Use condoms and avoid high-risk sexual practices to reduce the spread of sexually transmitted infections (STIs).  You should be screened for STIs, including gonorrhea and chlamydia if:  You are sexually active and are younger than 24 years.  You are older than 24 years, and your health care provider tells you that you are at risk for this type of infection.  Your sexual activity has changed since you were last screened, and you are at an increased risk for chlamydia or gonorrhea. Ask your health care provider if you are at risk.  If you are at risk of being infected with HIV, it is recommended that you take a prescription medicine daily to prevent HIV infection. This is called pre-exposure prophylaxis (PrEP). You are considered at risk if:  You are a man who has sex with other men (MSM).  You are a heterosexual man who is sexually active with multiple partners.  You take drugs by injection.  You are  sexually active with a partner who has HIV.  Talk with your health care provider about whether you are at high risk of being infected with HIV. If you choose to begin PrEP, you should first be tested for HIV. You should then be tested every 3 months for as long as you are taking PrEP.  Use sunscreen. Apply sunscreen liberally and repeatedly throughout the day. You should seek shade when your shadow is shorter than you. Protect yourself by wearing long sleeves, pants, a wide-brimmed hat, and sunglasses year round whenever you are outdoors.  Tell your health care provider of new moles or changes in moles, especially if there is a change in shape or color. Also, tell your health care provider if a mole is larger than the size of a pencil eraser.  A one-time screening for abdominal aortic aneurysm (AAA) and surgical repair of large AAAs by ultrasound is recommended for men aged 98-75 years who are current or former smokers.  Stay current with your vaccines (immunizations).   This information is not intended to replace advice given to you by your health care provider. Make sure you discuss any questions you have with your health care provider.   Document Released: 04/01/2008 Document Revised: 10/25/2014 Document Reviewed: 03/01/2011 Elsevier Interactive Patient Education [employer].

## 2016-09-27 ENCOUNTER — Other Ambulatory Visit: Payer: Self-pay | Admitting: Cardiovascular Disease

## 2016-09-28 ENCOUNTER — Other Ambulatory Visit: Payer: Self-pay | Admitting: Cardiovascular Disease

## 2016-09-28 MED ORDER — METOPROLOL SUCCINATE ER 25 MG PO TB24: ORAL_TABLET | ORAL | 0 refills | Status: DC

## 2016-11-08 ENCOUNTER — Telehealth: Payer: Self-pay | Admitting: Adult Health

## 2016-11-08 NOTE — Telephone Encounter (Signed)
Pt would like to know if Tommi Rumps will prescribe him Viagra. Pt states generic ok.  [facility]

## 2016-11-09 ENCOUNTER — Other Ambulatory Visit: Payer: Self-pay | Admitting: Adult Health

## 2016-11-09 MED ORDER — SILDENAFIL CITRATE 20 MG PO TABS: ORAL_TABLET | ORAL | 3 refills | Status: DC

## 2016-11-09 NOTE — Telephone Encounter (Signed)
Is this ok?

## 2016-11-22 ENCOUNTER — Ambulatory Visit (INDEPENDENT_AMBULATORY_CARE_PROVIDER_SITE_OTHER): Payer: Medicare Other | Admitting: Family Medicine

## 2016-11-22 ENCOUNTER — Encounter: Payer: Self-pay | Admitting: Family Medicine

## 2016-11-22 VITALS — BP 130/70 | Temp 99.1°F | Ht 66.0 in | Wt 163.8 lb

## 2016-11-22 DIAGNOSIS — J432 Centrilobular emphysema: Secondary | ICD-10-CM

## 2016-11-22 DIAGNOSIS — F17200 Nicotine dependence, unspecified, uncomplicated: Secondary | ICD-10-CM | POA: Diagnosis not present

## 2016-11-22 DIAGNOSIS — B9789 Other viral agents as the cause of diseases classified elsewhere: Secondary | ICD-10-CM

## 2016-11-22 DIAGNOSIS — J069 Acute upper respiratory infection, unspecified: Secondary | ICD-10-CM

## 2016-11-22 HISTORY — DX: Acute upper respiratory infection, unspecified: J06.9

## 2016-11-22 MED ORDER — PREDNISONE 20 MG PO TABS: ORAL_TABLET | ORAL | 1 refills | Status: DC

## 2016-11-22 MED ORDER — HYDROCODONE-HOMATROPINE 5-1.5 MG/5ML PO SYRP: 5.0000 mL | ORAL_SOLUTION | Freq: Three times a day (TID) | ORAL | 0 refills | Status: DC | PRN

## 2016-11-22 NOTE — Patient Instructions (Signed)
Okay to continue to use the nicotine patch.......... Stop vaping  Drink lots of liquids  Chloraseptic  Hydromet.......Marland Kitchen 1/2-1 teaspoon at bedtime  Prednisone 20 mg........ 2 tabs 3 days taper as outlined

## 2016-11-22 NOTE — Progress Notes (Signed)
Rhylin is a 66 year old male smoker.Marland KitchenMarland KitchenMarland KitchenMarland Kitchen 10 cigarettes a day...Marland KitchenMarland KitchenMarland Kitchen who continues to smoke use the nicotine patches and is also now vaping,,,,,,,, who comes in today for evaluation of a cold for a week  He's had head congestion sore throat slight cough. No fever chills.  He has a history of COPD and uses albuterol when necessary. He's been evaluated in pulmonary.  BP 130/70 (BP Location: Left Arm, Patient Position: Sitting, Cuff Size: Normal)   Temp 99.1 F (37.3 C) (Oral)   Ht 5\' 6"  (1.676 m)   Wt 163 lb 12.8 oz (74.3 kg)   BMI 26.44 kg/m  HEENT were negative except for evidence his previous uvulectomy and upper dental plate. Neck was supple no adenopathy lungs showed symmetrical breath sounds but wheezing mild to moderate inspiratory and expiratory  #1 viral syndrome......... treat symptomatically with liquids Tylenol Hydromet half to full teaspoon daily at bedtime when necessary and a short course of prednisone for the wheezing

## 2016-11-22 NOTE — Progress Notes (Signed)
Pre visit review using our clinic review tool, if applicable. No additional management support is needed unless otherwise documented below in the visit note. 

## 2016-12-30 ENCOUNTER — Encounter: Payer: Self-pay | Admitting: Cardiovascular Disease

## 2017-01-06 ENCOUNTER — Other Ambulatory Visit: Payer: Self-pay | Admitting: Cardiovascular Disease

## 2017-01-10 ENCOUNTER — Ambulatory Visit (INDEPENDENT_AMBULATORY_CARE_PROVIDER_SITE_OTHER): Payer: Medicare Other | Admitting: Cardiovascular Disease

## 2017-01-10 ENCOUNTER — Encounter: Payer: Self-pay | Admitting: Cardiovascular Disease

## 2017-01-10 VITALS — BP 128/62 | HR 60 | Ht 66.0 in | Wt 165.4 lb

## 2017-01-10 DIAGNOSIS — I2510 Atherosclerotic heart disease of native coronary artery without angina pectoris: Secondary | ICD-10-CM

## 2017-01-10 DIAGNOSIS — F17200 Nicotine dependence, unspecified, uncomplicated: Secondary | ICD-10-CM | POA: Diagnosis not present

## 2017-01-10 DIAGNOSIS — E7800 Pure hypercholesterolemia, unspecified: Secondary | ICD-10-CM

## 2017-01-10 DIAGNOSIS — I10 Essential (primary) hypertension: Secondary | ICD-10-CM

## 2017-01-10 MED ORDER — METOPROLOL SUCCINATE ER 25 MG PO TB24: ORAL_TABLET | ORAL | 3 refills | Status: DC

## 2017-01-10 MED ORDER — ATORVASTATIN CALCIUM 80 MG PO TABS: 80.0000 mg | ORAL_TABLET | Freq: Every day | ORAL | 3 refills | Status: DC

## 2017-01-10 NOTE — Progress Notes (Signed)
Chief Complaint  Patient presents with  . Follow-up    History of Present Illness: 66 yo male with history of CAD, COPD, tobacco abuse, HLD, bipolar disorder here today for cardiac follow up. He has been followed in the past by Dr. Lia Foyer. He presented with an anterolateral STEMI in 2009 and had a bare metal stent placed in the large Diagonal branch. Last cath April 2011 with mild plaque LAD, patent Diagonal stent with 50% restenosis, 50% mid Circumflex stenosis, 30% distal RCA stenosis, 60% focal PLA stenosis. LV function was normal. Last stress myoview March 2016 without ischemia. Abnormal LFTs in primary care in October 2017 with fatty liver on CT.   He is here today for follow up. He denies chest pain, SOB, palpitations. He has stable exertional dyspnea due to COPD. Still smoking 1 ppd.   Primary Care Provider Dorothyann Peng, NP  Past Medical History:  Diagnosis Date  . AMI (acute myocardial infarction)    Acute ST elevation  . Bipolar disorder ([place])   . COPD (chronic obstructive pulmonary disease) ([place])   . Coronary artery disease   . Hyperlipidemia   . Hypertension   . Nodule of left lung    6-mm smooothly rounded nodule  . Syncope and collapse    in 2001 and 2004 with no clear cause  . Tobacco abuse   . Viral URI with cough 11/22/2016    Past Surgical History:  Procedure Laterality Date  . stent placed in heart      Current Outpatient Prescriptions  Medication Sig Dispense Refill  . albuterol (PROAIR HFA) 108 (90 BASE) MCG/ACT inhaler Inhale 2 puffs into the lungs every 6 (six) hours as needed. 1 Inhaler 0  . albuterol (PROVENTIL HFA;VENTOLIN HFA) 108 (90 Base) MCG/ACT inhaler Inhale 2 puffs into the lungs every 6 (six) hours as needed for wheezing or shortness of breath.    Marland Kitchen aspirin 81 MG tablet Take 81 mg by mouth daily.      Marland Kitchen atorvastatin (LIPITOR) 80 MG tablet Take 1 tablet (80 mg total) by mouth daily. 90 tablet 3  . FLUoxetine (PROZAC) 20 MG capsule Take 1  capsule by mouth  daily 90 capsule 1  . lisinopril (PRINIVIL,ZESTRIL) 5 MG tablet Take 1 tablet (5 mg total) by mouth daily. 90 tablet 3  . metoprolol succinate (TOPROL-XL) 25 MG 24 hr tablet Take 1 tablet by mouth  daily 90 tablet 3  . Multiple Vitamin (MULTIVITAMIN) tablet Take 1 tablet by mouth daily.      . nitroGLYCERIN (NITROSTAT) 0.4 MG SL tablet Place 0.4 mg under the tongue every 5 (five) minutes as needed for chest pain.    Marland Kitchen OLANZapine (ZYPREXA) 5 MG tablet Take 5 mg by mouth at bedtime.      . sildenafil (REVATIO) 20 MG tablet Take 2-4 tablets as needed 50 tablet 3  . sildenafil (REVATIO) 20 MG tablet Take 40-80 mg by mouth daily as needed (ED).     No current facility-administered medications for this visit.     No Known Allergies  Social History   Social History  . Marital status: Married    Spouse name: N/A  . Number of children: 1  . Years of education: N/A   Occupational History  . Retired-Sales/broker    Social History Main Topics  . Smoking status: Current Every Day Smoker    Packs/day: 1.00    Years: 45.00    Types: Cigarettes  . Smokeless tobacco: Never Used  Comment: 1 pack a day  . Alcohol use 0.6 oz/week    1 Standard drinks or equivalent per week     Comment: 2 glasses of wine daily  . Drug use: No     Comment: marijuana quit 2010. He has hx of THC use.  Marland Kitchen Sexual activity: Not on file   Other Topics Concern  . Not on file   Social History Narrative   He is not working   He has been married for 35 years    One child (son)       He likes to [employer] - plays once a week     Family History  Problem Relation Age of Onset  . Dementia Mother 41    died  . Alcohol abuse Father 40  . Depression Brother     Review of Systems:  As stated in the HPI and otherwise negative.   BP 128/62   Pulse 60   Ht 5\' 6"  (1.676 m)   Wt 165 lb 6.4 oz (75 kg)   BMI 26.70 kg/m   Physical Examination: General: Well developed, well nourished, NAD  HEENT:  OP clear, mucus membranes moist  SKIN: warm, dry. No rashes. Neuro: No focal deficits  Musculoskeletal: Muscle strength 5/5 all ext  Psychiatric: Mood and affect normal  Neck: No JVD, no carotid bruits, no thyromegaly, no lymphadenopathy.  Lungs:Clear bilaterally, no wheezes, rhonci, crackles Cardiovascular: Regular rate and rhythm. No murmurs, gallops or rubs. Abdomen:Soft. Bowel sounds present. Non-tender.  Extremities: No lower extremity edema. Pulses are 2 + in the bilateral DP/PT.  EKG:  EKG is   ordered today. The ekg ordered today demonstrates sinus, rate 60 bpm  Recent Labs: 08/06/2016: ALT 59; BUN 16; Creatinine, Ser 0.92; Hemoglobin 14.4; Platelets 218.0; Potassium 4.0; Sodium 142; TSH 1.79   Lipid Panel    Component Value Date/Time   CHOL 146 08/06/2016 0751   TRIG 96.0 08/06/2016 0751   HDL 68.40 08/06/2016 0751   CHOLHDL 2 08/06/2016 0751   VLDL 19.2 08/06/2016 0751   LDLCALC 59 08/06/2016 0751     Wt Readings from Last 3 Encounters:  01/10/17 165 lb 6.4 oz (75 kg)  11/22/16 163 lb 12.8 oz (74.3 kg)  08/06/16 160 lb 14.4 oz (73 kg)     Other studies Reviewed: Additional studies/ records that were reviewed today include: . Review of the above records demonstrates:    Assessment and Plan:   1. CAD without angina: He has no chest pain suggestive of angina. Stress testing in 2016 without ischemia. Last cath in 2011 with moderate disease. Will continue ASA, statin, beta blocker and Ace-inh.   2. Tobacco abuse: Continues to smoke 1 ppd. 5 minutes spent on cessation counseling.   3. Hyperlipidemia: LDL is at goal. Will continue statin.      4. HTN: BP controlled. No changes today.   5. Abnormal LFTs: will repeat LFTs today.   Current medicines are reviewed at length with the patient today.  The patient does not have concerns regarding medicines.  The following changes have been made:  no change  Labs/ tests ordered today include:   Orders Placed This  Encounter  Procedures  . Hepatic function panel  . EKG 12-Lead   Disposition:   FU with me in 12  months  Signed, Lauree Chandler, MD 01/10/2017 10:03 AM    [facility] [address], [place], [place]  [address] Phone: [phone]; Fax: [phone]

## 2017-01-10 NOTE — Patient Instructions (Signed)
Medication Instructions:  Your physician recommends that you continue on your current medications as directed. Please refer to the Current Medication list given to you today.   Labwork: Lab work to be done today--LFT's  Testing/Procedures: none  Follow-Up: Your physician recommends that you schedule a follow-up appointment in: 12 months. Please call our office in about 9 months to schedule this appointment    Any Other Special Instructions Will Be Listed Below (If Applicable).     If you need a refill on your cardiac medications before your next appointment, please call your pharmacy.

## 2017-01-11 LAB — HEPATIC FUNCTION PANEL
ALK PHOS: 115 IU/L (ref 39–117)
ALT: 27 IU/L (ref 0–44)
AST: 28 IU/L (ref 0–40)
Albumin: 4.4 g/dL (ref 3.6–4.8)
BILIRUBIN, DIRECT: 0.24 mg/dL (ref 0.00–0.40)
Bilirubin Total: 0.9 mg/dL (ref 0.0–1.2)
TOTAL PROTEIN: 6.4 g/dL (ref 6.0–8.5)

## 2017-03-25 ENCOUNTER — Telehealth: Payer: Self-pay | Admitting: *Deleted

## 2017-03-25 NOTE — Telephone Encounter (Signed)
Patient called in to report that on 03/24/17 after taking morning medications he experienced tongue swelling and slight shortness of breath, patient stated symptoms resolved soon. Patient feels his reaction is due to lisinopril; patient has not taken morning medications at this point today. Requesting appointment with MD on Monday; patient declines need to be seen today. Educated patient on anaphylactic reactions and to seek emergent medical treatment should he experience these signs again. Patient voiced understanding to instructions and states he is not going to take lisinopril until discussion with provider; Scheduled patient appt for Monday, sent message to PCP for review.

## 2017-03-28 ENCOUNTER — Ambulatory Visit (INDEPENDENT_AMBULATORY_CARE_PROVIDER_SITE_OTHER): Payer: Medicare Other | Admitting: Family Medicine

## 2017-03-28 ENCOUNTER — Encounter: Payer: Self-pay | Admitting: Family Medicine

## 2017-03-28 VITALS — BP 130/80 | HR 95 | Resp 12 | Ht 66.0 in | Wt 163.4 lb

## 2017-03-28 DIAGNOSIS — I10 Essential (primary) hypertension: Secondary | ICD-10-CM

## 2017-03-28 DIAGNOSIS — K148 Other diseases of tongue: Secondary | ICD-10-CM | POA: Diagnosis not present

## 2017-03-28 NOTE — Patient Instructions (Signed)
A few things to remember from today's visit:   Essential hypertension  Tongue edema  Blood pressure goal for most people is less than 140/90. Some populations (older than 60) the goal is less than 150/90.  Most recent cardiologists' recommendations recommend blood pressure at or less than 130/80.   Elevated blood pressure increases the risk of strokes, heart and kidney disease, and eye problems. Regular physical activity and a healthy diet (DASH diet) usually help. Low salt diet. Take medications as instructed.  Caution with some over the counter medications as cold medications, dietary products (for weight loss), and Ibuprofen or Aleve (frequent use);all these medications could cause elevation of blood pressure.   Please be sure medication list is accurate.

## 2017-03-28 NOTE — Progress Notes (Signed)
HPI:   ACUTE VISIT:  Chief Complaint  Patient presents with  . Medication Reaction    [name] is a 66 y.o. male, who is here today complaining of intermittent tongue edema, he has had 2 episodes in the past 3-4 months.  First episode in 12/2016 and last one 4 days ago. Sudden onset of tongue edema, no pain.  + Dysphagia and difficulty breathing. Last episode lasted about 2 hours. He has not identified exacerbating or alleviating factors. He didn't seek immediate medical attention when he was having symptoms, resolved spontaneously.  He denies facial or lip edema. No fever,chills, stridor,cough, wheezing, abdominal pain, nausea, or vomiting.   Started Lisinopril about 6 months ago, no other new medication. He discontinued Lisinopril about 3 days ago.   Other  This is a new problem. The current episode started more than 1 month ago. The problem occurs intermittently. The problem has been resolved. Pertinent negatives include no abdominal pain, change in bowel habit, chest pain, congestion, coughing, fatigue, fever, headaches, myalgias, nausea, neck pain, numbness, rash, sore throat, swollen glands, visual change, vomiting or weakness. He has tried nothing for the symptoms.   He states that he read on line that mediations can cause it but also "cancer"; he is concerned about the later one.  + Tobacco use. He denies abnormal wt loss or oral lesions/changes in between episodes.  HTN:  He is checking BP at home, reporting BP readings  Sine Lisinopril discontinued:130s/70-80'ss. He is currently on metoprolol succinate 25 mg daily. Denies severe/frequent headache, visual changes, chest pain, or palpitation.    Review of Systems  Constitutional: Negative for activity change, appetite change, fatigue, fever and unexpected weight change.  HENT: Positive for trouble swallowing and voice change. Negative for congestion, facial swelling, mouth sores, nosebleeds and  sore throat.   Eyes: Negative for redness and visual disturbance.  Respiratory: Negative for cough and wheezing.   Cardiovascular: Negative for chest pain, palpitations and leg swelling.  Gastrointestinal: Negative for abdominal pain, change in bowel habit, nausea and vomiting.  Musculoskeletal: Negative for myalgias and neck pain.  Skin: Negative for color change and rash.  Neurological: Negative for syncope, weakness, numbness and headaches.  Hematological: Negative for adenopathy. Does not bruise/bleed easily.  Psychiatric/Behavioral: Negative for confusion. The patient is nervous/anxious.       Current Outpatient Prescriptions on File Prior to Visit  Medication Sig Dispense Refill  . albuterol (PROAIR HFA) 108 (90 BASE) MCG/ACT inhaler Inhale 2 puffs into the lungs every 6 (six) hours as needed. 1 Inhaler 0  . albuterol (PROVENTIL HFA;VENTOLIN HFA) 108 (90 Base) MCG/ACT inhaler Inhale 2 puffs into the lungs every 6 (six) hours as needed for wheezing or shortness of breath.    Marland Kitchen aspirin 81 MG tablet Take 81 mg by mouth daily.      Marland Kitchen atorvastatin (LIPITOR) 80 MG tablet Take 1 tablet (80 mg total) by mouth daily. 90 tablet 3  . FLUoxetine (PROZAC) 20 MG capsule Take 1 capsule by mouth  daily 90 capsule 1  . metoprolol succinate (TOPROL-XL) 25 MG 24 hr tablet Take 1 tablet by mouth  daily 90 tablet 3  . Multiple Vitamin (MULTIVITAMIN) tablet Take 1 tablet by mouth daily.      . nitroGLYCERIN (NITROSTAT) 0.4 MG SL tablet Place 0.4 mg under the tongue every 5 (five) minutes as needed for chest pain.    Marland Kitchen OLANZapine (ZYPREXA) 5 MG tablet Take 5 mg by mouth at bedtime.      Marland Kitchen  sildenafil (REVATIO) 20 MG tablet Take 2-4 tablets as needed 50 tablet 3  . sildenafil (REVATIO) 20 MG tablet Take 40-80 mg by mouth daily as needed (ED).     No current facility-administered medications on file prior to visit.      Past Medical History:  Diagnosis Date  . AMI (acute myocardial infarction) ([place])      Acute ST elevation  . Bipolar disorder ([place])   . COPD (chronic obstructive pulmonary disease) ([place])   . Coronary artery disease   . Hyperlipidemia   . Hypertension   . Nodule of left lung    6-mm smooothly rounded nodule  . Syncope and collapse    in 2001 and 2004 with no clear cause  . Tobacco abuse   . Viral URI with cough 11/22/2016   Allergies  Allergen Reactions  . Lisinopril Swelling    Social History   Social History  . Marital status: Married    Spouse name: N/A  . Number of children: 1  . Years of education: N/A   Occupational History  . Retired-Sales/broker    Social History Main Topics  . Smoking status: Current Every Day Smoker    Packs/day: 1.00    Years: 45.00    Types: Cigarettes  . Smokeless tobacco: Never Used     Comment: 1 pack a day  . Alcohol use 0.6 oz/week    1 Standard drinks or equivalent per week     Comment: 2 glasses of wine daily  . Drug use: No     Comment: marijuana quit 2010. He has hx of THC use.  Marland Kitchen Sexual activity: Not Asked   Other Topics Concern  . None   Social History Narrative   He is not working   He has been married for 35 years    One child (son)       He likes to [employer] - plays once a week     Vitals:   03/28/17 0833  BP: 130/80  Pulse: 95  Resp: 12  O2 sat at RA 96% Body mass index is 26.37 kg/m.   Physical Exam  Nursing note and vitals reviewed. Constitutional: He is oriented to person, place, and time. He appears well-developed. No distress.  HENT:  Head: Atraumatic.  Mouth/Throat: Oropharynx is clear and moist and mucous membranes are normal. He has dentures. No oral lesions. No trismus in the jaw. No uvula swelling.  Eyes: Conjunctivae and EOM are normal. Pupils are equal, round, and reactive to light.  Neck: No muscular tenderness present. No tracheal deviation, no edema and no erythema present. No thyroid mass and no thyromegaly present.  Cardiovascular: Normal rate and regular rhythm.   No  murmur heard. Pulses:      Dorsalis pedis pulses are 2+ on the right side, and 2+ on the left side.  Respiratory: Effort normal and breath sounds normal. No respiratory distress.  Musculoskeletal: He exhibits no edema.  Lymphadenopathy:       Head (right side): No submandibular adenopathy present.       Head (left side): No submandibular adenopathy present.    He has no cervical adenopathy.  Neurological: He is alert and oriented to person, place, and time. He has normal strength. Gait normal.  Skin: Skin is warm. No erythema.  Psychiatric: His mood appears anxious. Cognition and memory are normal.  Fairly groomed, good eye contact.    ASSESSMENT AND PLAN:    Daouda was seen today for medication reaction.  Diagnoses and all orders for this visit:  Tongue edema  Resolved.I do not see any suspicious lesion in oral mucosa. Adverse effects of tobacco discussed, encouraged smoking cessation. Hx suggest angioedema and most likely related to Lisinopril. Instructed about warning signs. F/U as needed.  Essential hypertension  BP adequately controlled. BP readings at home < 140/90. Low salt diet.  For now he will continue Metoprolol succinate 25 mg daily. Continue monitoring BP at home. Follow-up with PCP as planned.    -[name] was advised to seek immediate medical attention is symptoms suddenly re-occur or new concerns arise.       Viney Acocella G. Martinique, MD  [facility]. [place] office.

## 2017-06-03 ENCOUNTER — Other Ambulatory Visit: Payer: Self-pay

## 2017-06-03 ENCOUNTER — Encounter: Payer: Self-pay | Admitting: *Deleted

## 2017-06-03 ENCOUNTER — Other Ambulatory Visit: Payer: Self-pay | Admitting: *Deleted

## 2017-06-03 NOTE — Patient Outreach (Signed)
[facility]) Care Management  06/03/2017  Jeffrey Hudson Sep 02, 1951 [id number]   Referral via patient engagement tool:  Telephone call to patient who was advised of reason for call & [facility] care management services. HIPPA verification received.  Patient voices that he currently has no case management needs. States medical conditions are under control. States he is taking medications as prescribed and attending MD appointments.   Patient has declined [facility] care management services.  Patient consents to sending [facility] contact information.  Plan: Send [facility] contact information. Send to care management assistant to close case.  Sherrin Daisy, RN BSN [place] Management Coordinator [facility] Care Management  [phone]

## 2017-06-03 NOTE — Patient Outreach (Signed)
[facility]) Care Management  06/03/2017  Jeffrey Hudson [birth year] [id number]   Medication Adherence call to [name]  the reason for this call is because Jeffrey Hudson is showing past due under [place] health Care Ins.on his lisinopril 5 mg spoke to patient he said he was taken off the medication because of side effects.    [place] Management Direct Dial [phone]  Fax [phone] [email or web address]

## 2017-06-27 ENCOUNTER — Telehealth: Payer: Self-pay | Admitting: Pulmonary Disease

## 2017-06-27 NOTE — Telephone Encounter (Signed)
Should show up

## 2017-06-27 NOTE — Telephone Encounter (Signed)
Pt returned call. He can be reached at [phone]

## 2017-06-27 NOTE — Telephone Encounter (Signed)
Called and spoke with pt.  Pt states he is scheduled for CT chest on 07/12/17. Pt states he has developed a lump on left breast that is sore to the touch. Pt is wanting to know if lump will show up on CT.  RA please advise. Thanks.

## 2017-06-27 NOTE — Telephone Encounter (Signed)
lmtcb x1 for pt. 

## 2017-06-27 NOTE — Telephone Encounter (Signed)
Spoke with the pt and notified of recs per RA  He verbalized understanding  Nothing further needed

## 2017-07-08 ENCOUNTER — Encounter: Payer: Self-pay | Admitting: Adult Health

## 2017-07-12 ENCOUNTER — Ambulatory Visit (INDEPENDENT_AMBULATORY_CARE_PROVIDER_SITE_OTHER)
Admission: RE | Admit: 2017-07-12 | Discharge: 2017-07-12 | Disposition: A | Discharge status: Home / Self Care | Payer: Medicare Other | Source: Ambulatory Visit | Attending: Acute Care | Admitting: Acute Care

## 2017-07-12 DIAGNOSIS — F17210 Nicotine dependence, cigarettes, uncomplicated: Secondary | ICD-10-CM

## 2017-07-12 DIAGNOSIS — Z87891 Personal history of nicotine dependence: Secondary | ICD-10-CM | POA: Diagnosis not present

## 2017-07-14 ENCOUNTER — Other Ambulatory Visit: Payer: Self-pay | Admitting: Acute Care

## 2017-07-14 ENCOUNTER — Encounter: Payer: Self-pay | Admitting: Adult Health

## 2017-07-14 ENCOUNTER — Ambulatory Visit (INDEPENDENT_AMBULATORY_CARE_PROVIDER_SITE_OTHER): Payer: Medicare Other | Admitting: Adult Health

## 2017-07-14 VITALS — BP 134/70 | Temp 98.5°F | Ht 66.0 in | Wt 165.0 lb

## 2017-07-14 DIAGNOSIS — F17210 Nicotine dependence, cigarettes, uncomplicated: Secondary | ICD-10-CM

## 2017-07-14 DIAGNOSIS — N62 Hypertrophy of breast: Secondary | ICD-10-CM | POA: Diagnosis not present

## 2017-07-14 DIAGNOSIS — Z122 Encounter for screening for malignant neoplasm of respiratory organs: Secondary | ICD-10-CM

## 2017-07-14 NOTE — Progress Notes (Signed)
Subjective:    Patient ID: Jeffrey Hudson, male    DOB: 1950/12/26, 66 y.o.   MRN: [id number]  HPI  66 year old male who  has a past medical history of AMI (acute myocardial infarction) ([place]); Bipolar disorder ([place]); COPD (chronic obstructive pulmonary disease) ([place]); Coronary artery disease; Hyperlipidemia; Hypertension; Nodule of left lung; Syncope and collapse; Tobacco abuse; and Viral URI with cough (11/22/2016).   He presents to the office with the complaint of " lump in my left breast" . He first noticed this about 2-3 weeks ago. Endorses tenderness with palpation. He has had a mammogram in the past which showed moderate left gynecomastia, back in 2013 and had CT  Of chest yesterday that was negative.    Review of Systems See HPI   Past Medical History:  Diagnosis Date  . AMI (acute myocardial infarction) ([place])    Acute ST elevation  . Bipolar disorder ([place])   . COPD (chronic obstructive pulmonary disease) ([place])   . Coronary artery disease   . Hyperlipidemia   . Hypertension   . Nodule of left lung    6-mm smooothly rounded nodule  . Syncope and collapse    in 2001 and 2004 with no clear cause  . Tobacco abuse   . Viral URI with cough 11/22/2016    Social History   Social History  . Marital status: Married    Spouse name: N/A  . Number of children: 1  . Years of education: N/A   Occupational History  . Retired-Sales/broker    Social History Main Topics  . Smoking status: Current Every Day Smoker    Packs/day: 1.00    Years: 45.00    Types: Cigarettes  . Smokeless tobacco: Never Used     Comment: 1 pack a day  . Alcohol use 0.6 oz/week    1 Standard drinks or equivalent per week     Comment: 2 glasses of wine daily  . Drug use: No     Comment: marijuana quit 2010. He has hx of THC use.  Marland Kitchen Sexual activity: Not on file   Other Topics Concern  . Not on file   Social History Narrative   He is not working   He has been married for 35 years    One child  (son)       He likes to [employer] - plays once a week     Past Surgical History:  Procedure Laterality Date  . stent placed in heart      Family History  Problem Relation Age of Onset  . Dementia Mother 2       died  . Alcohol abuse Father 26  . Depression Brother     Allergies  Allergen Reactions  . Lisinopril Swelling    Current Outpatient Prescriptions on File Prior to Visit  Medication Sig Dispense Refill  . albuterol (PROAIR HFA) 108 (90 BASE) MCG/ACT inhaler Inhale 2 puffs into the lungs every 6 (six) hours as needed. 1 Inhaler 0  . aspirin 81 MG tablet Take 81 mg by mouth daily.      Marland Kitchen atorvastatin (LIPITOR) 80 MG tablet Take 1 tablet (80 mg total) by mouth daily. 90 tablet 3  . FLUoxetine (PROZAC) 20 MG capsule Take 1 capsule by mouth  daily 90 capsule 1  . metoprolol succinate (TOPROL-XL) 25 MG 24 hr tablet Take 1 tablet by mouth  daily 90 tablet 3  . Multiple Vitamin (MULTIVITAMIN) tablet Take 1 tablet  by mouth daily.      . nitroGLYCERIN (NITROSTAT) 0.4 MG SL tablet Place 0.4 mg under the tongue every 5 (five) minutes as needed for chest pain.    Marland Kitchen OLANZapine (ZYPREXA) 5 MG tablet Take 5 mg by mouth at bedtime.      . sildenafil (REVATIO) 20 MG tablet Take 2-4 tablets as needed (Patient not taking: Reported on 07/14/2017) 50 tablet 3  . sildenafil (REVATIO) 20 MG tablet Take 40-80 mg by mouth daily as needed (ED).     No current facility-administered medications on file prior to visit.     BP 134/70 (BP Location: Left Arm)   Temp 98.5 F (36.9 C) (Oral)   Ht 5\' 6"  (1.676 m)   Wt 165 lb (74.8 kg)   BMI 26.63 kg/m       Objective:   Physical Exam  Constitutional: He is oriented to person, place, and time. He appears well-developed and well-nourished. No distress.  Cardiovascular: Normal rate, regular rhythm, normal heart sounds and intact distal pulses.  Exam reveals no gallop and no friction rub.   No murmur heard. Pulmonary/Chest: Effort normal and breath  sounds normal. No respiratory distress. He has no wheezes. He has no rales. He exhibits no tenderness.  Musculoskeletal: He exhibits tenderness.  There is inflammation and an area of increased density in the left subareolar region. Area is smooth and moveable. No discrete mass or calcifications    Neurological: He is alert and oriented to person, place, and time.  Skin: Skin is warm and dry. No erythema. No pallor.  Psychiatric: He has a normal mood and affect. His behavior is normal. Judgment and thought content normal.  Vitals reviewed.     Assessment & Plan:  1. Gynecomastia, male - Exam consistent with gynecomastia. Advised patient that this is a benign finding and should eventually resolve  - Can take Nsaids or Tylenol as needed - Follow up as needed  Dorothyann Peng, NP

## 2017-09-21 ENCOUNTER — Ambulatory Visit: Payer: Self-pay

## 2017-09-21 ENCOUNTER — Ambulatory Visit: Payer: Medicare Other | Admitting: Internal Medicine

## 2017-09-21 ENCOUNTER — Encounter: Payer: Self-pay | Admitting: Internal Medicine

## 2017-09-21 VITALS — BP 148/62 | HR 69 | Temp 98.4°F | Ht 72.0 in | Wt 167.4 lb

## 2017-09-21 DIAGNOSIS — L309 Dermatitis, unspecified: Secondary | ICD-10-CM | POA: Diagnosis not present

## 2017-09-21 DIAGNOSIS — M7711 Lateral epicondylitis, right elbow: Secondary | ICD-10-CM | POA: Diagnosis not present

## 2017-09-21 MED ORDER — PREDNISONE 10 MG PO TABS: ORAL_TABLET | ORAL | 0 refills | Status: DC

## 2017-09-21 NOTE — Progress Notes (Signed)
Subjective:    Patient ID: Jeffrey Hudson, male    DOB: 1951/03/06, 66 y.o.   MRN: [id number]  HPI 66 year old patient who presents with 2 issues. For the past 3 weeks he has had some right lateral shoulder pain.  He states he has a prior history of tennis elbow intermittently in the past but this has been refractory to treatment.  He has been using ibuprofen without much benefit Over the past 3 weeks he is also noted a pruritic maculopapular rash over the anterior chest trunk and upper back area.  Past Medical History:  Diagnosis Date  . AMI (acute myocardial infarction) ([place])    Acute ST elevation  . Bipolar disorder ([place])   . COPD (chronic obstructive pulmonary disease) ([place])   . Coronary artery disease   . Hyperlipidemia   . Hypertension   . Nodule of left lung    6-mm smooothly rounded nodule  . Syncope and collapse    in 2001 and 2004 with no clear cause  . Tobacco abuse   . Viral URI with cough 11/22/2016     Social History   Socioeconomic History  . Marital status: Married    Spouse name: Not on file  . Number of children: 1  . Years of education: Not on file  . Highest education level: Not on file  Social Needs  . Financial resource strain: Not on file  . Food insecurity - worry: Not on file  . Food insecurity - inability: Not on file  . Transportation needs - medical: Not on file  . Transportation needs - non-medical: Not on file  Occupational History  . Occupation: Retired-Sales/broker  Tobacco Use  . Smoking status: Current Every Day Smoker    Packs/day: 1.00    Years: 45.00    Pack years: 45.00    Types: Cigarettes  . Smokeless tobacco: Never Used  . Tobacco comment: 1 pack a day  Substance and Sexual Activity  . Alcohol use: Yes    Alcohol/week: 0.6 oz    Types: 1 Standard drinks or equivalent per week    Comment: 2 glasses of wine daily  . Drug use: No    Comment: marijuana quit 2010. He has hx of THC use.  Marland Kitchen Sexual activity: Not on file    Other Topics Concern  . Not on file  Social History Narrative   He is not working   He has been married for 35 years    One child (son)       He likes to [employer] - plays once a week     Past Surgical History:  Procedure Laterality Date  . stent placed in heart      Family History  Problem Relation Age of Onset  . Dementia Mother 16       died  . Alcohol abuse Father 53  . Depression Brother     Allergies  Allergen Reactions  . Lisinopril Swelling    Current Outpatient Medications on File Prior to Visit  Medication Sig Dispense Refill  . albuterol (PROAIR HFA) 108 (90 BASE) MCG/ACT inhaler Inhale 2 puffs into the lungs every 6 (six) hours as needed. 1 Inhaler 0  . aspirin 81 MG tablet Take 81 mg by mouth daily.      Marland Kitchen atorvastatin (LIPITOR) 80 MG tablet Take 1 tablet (80 mg total) by mouth daily. 90 tablet 3  . FLUoxetine (PROZAC) 20 MG capsule Take 1 capsule by mouth  daily 90 capsule  1  . metoprolol succinate (TOPROL-XL) 25 MG 24 hr tablet Take 1 tablet by mouth  daily 90 tablet 3  . Multiple Vitamin (MULTIVITAMIN) tablet Take 1 tablet by mouth daily.      . nitroGLYCERIN (NITROSTAT) 0.4 MG SL tablet Place 0.4 mg under the tongue every 5 (five) minutes as needed for chest pain.    Marland Kitchen OLANZapine (ZYPREXA) 5 MG tablet Take 5 mg by mouth at bedtime.       No current facility-administered medications on file prior to visit.     BP (!) 148/62 (BP Location: Left Arm, Patient Position: Sitting, Cuff Size: Normal)   Pulse 69   Temp 98.4 F (36.9 C) (Oral)   Ht 6' (1.829 m)   Wt 167 lb 6.4 oz (75.9 kg)   SpO2 96%   BMI 22.70 kg/m      Review of Systems  Constitutional: Negative for appetite change, chills, fatigue and fever.  HENT: Negative for congestion, dental problem, ear pain, hearing loss, sore throat, tinnitus, trouble swallowing and voice change.   Eyes: Negative for pain, discharge and visual disturbance.  Respiratory: Negative for cough, chest tightness,  wheezing and stridor.   Cardiovascular: Negative for chest pain, palpitations and leg swelling.  Gastrointestinal: Negative for abdominal distention, abdominal pain, blood in stool, constipation, diarrhea, nausea and vomiting.  Genitourinary: Negative for difficulty urinating, discharge, flank pain, genital sores, hematuria and urgency.  Musculoskeletal: Negative for arthralgias, back pain, gait problem, joint swelling, myalgias and neck stiffness.       Right lateral elbow pain  Skin: Positive for rash.  Neurological: Negative for dizziness, syncope, speech difficulty, weakness, numbness and headaches.  Hematological: Negative for adenopathy. Does not bruise/bleed easily.  Psychiatric/Behavioral: Negative for behavioral problems and dysphoric mood. The patient is not nervous/anxious.        Objective:   Physical Exam  Constitutional: He appears well-developed and well-nourished. No distress.  Musculoskeletal:  Tenderness over R lat elbow  Skin:  Erythematous maculopapular rash over the trunk and upper back areas          Assessment & Plan:   Maculopapular rash.  Possibly secondary to ibuprofen.  Right lateral epicondylitis  Procedure note.  After verbal consent, alcohol and Betadine prep, 1 cc of 40 mg Depo-Medrol with 2 cc of 1% Xylocaine injected about the right lateral epicondyle.  Bandage applied  Patient also treated with a prednisone Dosepak for the generalized pruritic dermatitis Follow-up PCP if unimproved Patient information dispensed concerning tennis elbow  Nyoka Cowden

## 2017-09-21 NOTE — Patient Instructions (Addendum)
Tennis Elbow Tennis elbow (lateral epicondylitis) is inflammation of the outer tendons of your forearm close to your elbow. Your tendons attach your muscles to your bones. The outer tendons of your forearm are used to extend your wrist, and they attach on the outside part of your elbow. Tennis elbow is often found in people who play tennis, but anyone may get the condition from repeatedly extending the wrist or turning the forearm. What are the causes? This condition is caused by repeatedly extending your wrist and using your hands. It can result from sports or work that requires repetitive forearm movements. Tennis elbow may also be caused by an injury. What increases the risk? You have a higher risk of developing tennis elbow if you play tennis or another racquet sport. You also have a higher risk if you frequently use your hands for work. This condition is also more likely to develop in:  Musicians.  Carpenters, painters, and plumbers.  Cooks.  Cashiers.  People who work in factories.  Construction workers.  Butchers.  People who use computers.  What are the signs or symptoms? Symptoms of this condition include:  Pain and tenderness in your forearm and the outer part of your elbow. You may only feel the pain when you use your arm, or you may feel it even when you are not using your arm.  A burning feeling that runs from your elbow through your arm.  Weak grip in your hands.  How is this diagnosed? This condition may be diagnosed by medical history and physical exam. You may also have other tests, including:  X-rays.  MRI.  How is this treated? Your health care provider will recommend lifestyle adjustments, such as resting and icing your arm. Treatment may also include:  Medicines for inflammation. This may include shots of cortisone if your pain continues.  Physical therapy. This may include massage or exercises.  An elbow brace.  Surgery may eventually be  recommended if your pain does not go away with treatment. Follow these instructions at home: Activity  Rest your elbow and wrist as directed by your health care provider. Try to avoid any activities that caused the problem until your health care provider says that you can do them again.  If a physical therapist teaches you exercises, do all of them as directed.  If you lift an object, lift it with your palm facing upward. This lowers the stress on your elbow. Lifestyle  If your tennis elbow is caused by sports, check your equipment and make sure that: ? You are using it correctly. ? It is the best fit for you.  If your tennis elbow is caused by work, take breaks frequently, if you are able. Talk with your manager about how to best perform tasks in a way that is safe. ? If your tennis elbow is caused by computer use, talk with your manager about any changes that can be made to your work environment. General instructions  If directed, apply ice to the painful area: ? Put ice in a plastic bag. ? Place a towel between your skin and the bag. ? Leave the ice on for 20 minutes, 2-3 times per day.  Take medicines only as directed by your health care provider.  If you were given a brace, wear it as directed by your health care provider.  Keep all follow-up visits as directed by your health care provider. This is important. Contact a health care provider if:  Your pain does not   get better with treatment.  Your pain gets worse.  You have numbness or weakness in your forearm, hand, or fingers. This information is not intended to replace advice given to you by your health care provider. Make sure you discuss any questions you have with your health care provider. Document Released: 10/04/2005 Document Revised: 06/03/2016 Document Reviewed: 09/30/2014 Elsevier Interactive Patient Education  2018 [employer].  

## 2017-11-01 ENCOUNTER — Other Ambulatory Visit: Payer: Self-pay | Admitting: Cardiovascular Disease

## 2017-12-26 ENCOUNTER — Other Ambulatory Visit: Payer: Self-pay | Admitting: Cardiovascular Disease

## 2017-12-26 MED ORDER — METOPROLOL SUCCINATE ER 25 MG PO TB24: ORAL_TABLET | ORAL | 0 refills | Status: DC

## 2017-12-26 NOTE — Telephone Encounter (Signed)
Pt' medication was sent to pt's pharmacy as requested. Confirmation received.  

## 2017-12-26 NOTE — Telephone Encounter (Signed)
New message   *STAT* If patient is at the pharmacy, call can be transferred to refill team.   1. Which medications need to be refilled? (please list name of each medication and dose if known) metoprolol succinate (TOPROL-XL) 25 MG 24 hr tablet  2. Which pharmacy/location (including street and city if local pharmacy) is medication to be sent to? [place] [place]  3. Do they need a 30 day or 90 day supply? [place]

## 2018-01-03 DIAGNOSIS — H40053 Ocular hypertension, bilateral: Secondary | ICD-10-CM | POA: Diagnosis not present

## 2018-01-09 NOTE — Progress Notes (Signed)
Cardiology Office Note:    Date:  01/10/2018   ID:  Jeffrey Hudson, DOB Oct 09, 1951, MRN [id number]  PCP:  Dorothyann Peng, NP  Cardiologist:  No primary care provider on file.   Referring MD: Dorothyann Peng, NP   Chief Complaint  Patient presents with  . Follow-up    CAD    History of Present Illness:    Jeffrey Hudson is a 67 y.o. male with a hx of CAD s/p anterolateral STEMI with BMS placed in large diagonal branch (2009), last cat 01/2010 with patent stent with 50% restenosis, 50% Cx, 30% distal RCA, and 60% focal PLA, COPD, tobacco use, HLD, bipolar disorder. Last myoview stress test in 12/2014 without reversible ischemia. He was continued on ASA, statin, beta blocker, ACEI.   He presents to clinic today for his annual follow up. He is doing well. He continues to smoke but has cut back to half of his normal using nicotine patches. He no longer smokes in the house. He is committed to continue working on cessation. He denies chest pain, palpitations, SOB, orthopnea, LE swelling, leg pain, dizziness, and syncope. He reports his normal DOE secondary to COPD. No change in breathing.    Past Medical History:  Diagnosis Date  . AMI (acute myocardial infarction) ([place])    Acute ST elevation  . Bipolar disorder ([place])   . COPD (chronic obstructive pulmonary disease) ([place])   . Coronary artery disease   . Hyperlipidemia   . Hypertension   . Nodule of left lung    6-mm smooothly rounded nodule  . Syncope and collapse    in 2001 and 2004 with no clear cause  . Tobacco abuse   . Viral URI with cough 11/22/2016    Past Surgical History:  Procedure Laterality Date  . stent placed in heart      Current Medications: Current Meds  Medication Sig  . albuterol (PROAIR HFA) 108 (90 BASE) MCG/ACT inhaler Inhale 2 puffs into the lungs every 6 (six) hours as needed.  Marland Kitchen aspirin 81 MG tablet Take 81 mg by mouth daily.    Marland Kitchen atorvastatin (LIPITOR) 80 MG tablet Take 1 tablet (80 mg total) by  mouth daily.  Marland Kitchen FLUoxetine (PROZAC) 20 MG capsule Take 1 capsule by mouth  daily  . metoprolol succinate (TOPROL-XL) 25 MG 24 hr tablet Take 1 tablet by mouth  Daily.  . Multiple Vitamin (MULTIVITAMIN) tablet Take 1 tablet by mouth daily.    . nitroGLYCERIN (NITROSTAT) 0.4 MG SL tablet Place 0.4 mg under the tongue every 5 (five) minutes as needed for chest pain.  Marland Kitchen OLANZapine (ZYPREXA) 5 MG tablet Take 5 mg by mouth at bedtime.    . [DISCONTINUED] atorvastatin (LIPITOR) 80 MG tablet Take 1 tablet (80 mg total) by mouth daily.  . [DISCONTINUED] metoprolol succinate (TOPROL-XL) 25 MG 24 hr tablet Take 1 tablet by mouth  Daily. Please keep upcoming appt for future refills. Thank you     Allergies:   Lisinopril   Social History   Socioeconomic History  . Marital status: Married    Spouse name: Not on file  . Number of children: 1  . Years of education: Not on file  . Highest education level: Not on file  Occupational History  . Occupation: Retired-Sales/broker  Social Needs  . Financial resource strain: Not on file  . Food insecurity:    Worry: Not on file    Inability: Not on file  . Transportation needs:  Medical: Not on file    Non-medical: Not on file  Tobacco Use  . Smoking status: Current Every Day Smoker    Packs/day: 1.00    Years: 45.00    Pack years: 45.00    Types: Cigarettes  . Smokeless tobacco: Never Used  . Tobacco comment: 1 pack a day  Substance and Sexual Activity  . Alcohol use: Yes    Alcohol/week: 0.6 oz    Types: 1 Standard drinks or equivalent per week    Comment: 2 glasses of wine daily  . Drug use: No    Comment: marijuana quit 2010. He has hx of THC use.  Marland Kitchen Sexual activity: Not on file  Lifestyle  . Physical activity:    Days per week: Not on file    Minutes per session: Not on file  . Stress: Not on file  Relationships  . Social connections:    Talks on phone: Not on file    Gets together: Not on file    Attends religious service: Not  on file    Active member of club or organization: Not on file    Attends meetings of clubs or organizations: Not on file    Relationship status: Not on file  Other Topics Concern  . Not on file  Social History Narrative   He is not working   He has been married for 33 years    One child (son)       He likes to [employer] - plays once a week      Family History: The patient's family history includes Alcohol abuse (age of onset: 49) in his father; Dementia (age of onset: 39) in his mother; Depression in his brother.  ROS:   Please see the history of present illness.    All other systems reviewed and are negative.  EKGs/Labs/Other Studies Reviewed:     EKG:  EKG is ordered today.  The ekg ordered today demonstrates sinus rhythm, unchanged from prior  Recent Labs: No results found for requested labs within last 8760 hours.  Recent Lipid Panel    Component Value Date/Time   CHOL 146 08/06/2016 0751   TRIG 96.0 08/06/2016 0751   HDL 68.40 08/06/2016 0751   CHOLHDL 2 08/06/2016 0751   VLDL 19.2 08/06/2016 0751   LDLCALC 59 08/06/2016 0751    Physical Exam:    VS:  BP 126/72   Pulse (!) 59   Ht 6' (1.829 m)   Wt 161 lb (73 kg)   SpO2 97%   BMI 21.84 kg/m     Wt Readings from Last 3 Encounters:  01/10/18 161 lb (73 kg)  09/21/17 167 lb 6.4 oz (75.9 kg)  07/14/17 165 lb (74.8 kg)     GEN: Well nourished, well developed in no acute distress HEENT: Normal NECK: No JVD; No carotid bruits LYMPHATICS: No lymphadenopathy CARDIAC: RRR, no murmurs, rubs, gallops RESPIRATORY:  Respirations unlabored,   ABDOMEN: Soft, non-tender, non-distended MUSCULOSKELETAL:  No edema; No deformity  SKIN: Warm and dry NEUROLOGIC:  Alert and oriented x 3 PSYCHIATRIC:  Normal affect   ASSESSMENT:    1. Coronary artery disease involving native coronary artery of native heart without angina pectoris   2. Essential hypertension   3. Pure hypercholesterolemia   4. Centrilobular emphysema  ([place])   5. Prediabetes    PLAN:    In order of problems listed above:  Coronary artery disease involving native coronary artery of native heart without angina pectoris -  Plan: EKG 12-Lead, Hemoglobin A1c, Hepatic function panel, Lipid panel Pt denies chest pain. He has been doing well on ASA and lipitor. Will check labs today as above. Continue toprol.   Essential hypertension Pressure is well-controlled at this visit. No medication changes.  Pure hypercholesterolemia He is compliant on lipitor. LDL goal is less than 70. Will check labs today.   Centrilobular emphysema ([place]) He has cut back on his smoking using nicotine patches. He is committed to continuing to reduce his smoking. He reports his usual dyspnea on exertion, which is normal for him in the setting of COPD. No changes.  Prediabetes Last A1c in 2017 was 6.0%. Will check this today.     Medication Adjustments/Labs and Tests Ordered: Current medicines are reviewed at length with the patient today.  Concerns regarding medicines are outlined above.  Orders Placed This Encounter  Procedures  . Hemoglobin A1c  . Hepatic function panel  . Lipid panel  . EKG 12-Lead   Meds ordered this encounter  Medications  . metoprolol succinate (TOPROL-XL) 25 MG 24 hr tablet    Sig: Take 1 tablet by mouth  Daily.    Dispense:  90 tablet    Refill:  3  . atorvastatin (LIPITOR) 80 MG tablet    Sig: Take 1 tablet (80 mg total) by mouth daily.    Dispense:  90 tablet    Refill:  3    Signed, Ledora Bottcher, [place]  01/10/2018 9:36 AM    [facility]

## 2018-01-10 ENCOUNTER — Encounter: Payer: Self-pay | Admitting: Cardiology

## 2018-01-10 ENCOUNTER — Ambulatory Visit: Payer: Medicare Other | Admitting: Physician Assistant

## 2018-01-10 VITALS — BP 126/72 | HR 59 | Ht 72.0 in | Wt 161.0 lb

## 2018-01-10 DIAGNOSIS — I10 Essential (primary) hypertension: Secondary | ICD-10-CM

## 2018-01-10 DIAGNOSIS — I2510 Atherosclerotic heart disease of native coronary artery without angina pectoris: Secondary | ICD-10-CM | POA: Diagnosis not present

## 2018-01-10 DIAGNOSIS — R7303 Prediabetes: Secondary | ICD-10-CM

## 2018-01-10 DIAGNOSIS — J432 Centrilobular emphysema: Secondary | ICD-10-CM | POA: Diagnosis not present

## 2018-01-10 DIAGNOSIS — E7800 Pure hypercholesterolemia, unspecified: Secondary | ICD-10-CM

## 2018-01-10 MED ORDER — METOPROLOL SUCCINATE ER 25 MG PO TB24: ORAL_TABLET | ORAL | 3 refills | Status: DC

## 2018-01-10 MED ORDER — ATORVASTATIN CALCIUM 80 MG PO TABS: 80.0000 mg | ORAL_TABLET | Freq: Every day | ORAL | 3 refills | Status: DC

## 2018-01-10 NOTE — Patient Instructions (Addendum)
Medication Instructions:  Your physician recommends that you continue on your current medications as directed. Please refer to the Current Medication list given to you today.  Labwork: Your physician recommends that you have lab work today- HgB A1c, lipid and liver panel.   Testing/Procedures: NONE  Follow-Up: Your physician wants you to follow-up in: 12 months with Dr. Angelena Form. You will receive a reminder letter in the mail two months in advance. If you don't receive a letter, please call our office to schedule the follow-up appointment.   If you need a refill on your cardiac medications before your next appointment, please call your pharmacy.

## 2018-01-11 LAB — HEPATIC FUNCTION PANEL
ALT: 40 IU/L (ref 0–44)
AST: 47 IU/L — AB (ref 0–40)
Albumin: 4.4 g/dL (ref 3.6–4.8)
Alkaline Phosphatase: 118 IU/L — ABNORMAL HIGH (ref 39–117)
Bilirubin Total: 1 mg/dL (ref 0.0–1.2)
Bilirubin, Direct: 0.34 mg/dL (ref 0.00–0.40)
TOTAL PROTEIN: 6.5 g/dL (ref 6.0–8.5)

## 2018-01-11 LAB — HEMOGLOBIN A1C
Est. average glucose Bld gHb Est-mCnc: 123 mg/dL
Hgb A1c MFr Bld: 5.9 % — ABNORMAL HIGH (ref 4.8–5.6)

## 2018-01-11 LAB — LIPID PANEL
Chol/HDL Ratio: 2.7 ratio (ref 0.0–5.0)
Cholesterol, Total: 173 mg/dL (ref 100–199)
HDL: 64 mg/dL
LDL Calculated: 89 mg/dL (ref 0–99)
Triglycerides: 98 mg/dL (ref 0–149)
VLDL Cholesterol Cal: 20 mg/dL (ref 5–40)

## 2018-01-30 ENCOUNTER — Ambulatory Visit: Payer: Medicare Other | Admitting: Podiatry

## 2018-01-30 ENCOUNTER — Encounter: Payer: Self-pay | Admitting: Podiatry

## 2018-01-30 DIAGNOSIS — M79676 Pain in unspecified toe(s): Secondary | ICD-10-CM

## 2018-01-30 DIAGNOSIS — B351 Tinea unguium: Secondary | ICD-10-CM | POA: Diagnosis not present

## 2018-02-03 NOTE — Progress Notes (Signed)
   SUBJECTIVE Patient presents to office today complaining of elongated, thickened nails that cause pain while ambulating in shoes. He is unable to trim his own nails. Patient is here for further evaluation and treatment.  Past Medical History:  Diagnosis Date  . AMI (acute myocardial infarction) ([place])    Acute ST elevation  . Bipolar disorder ([place])   . COPD (chronic obstructive pulmonary disease) ([place])   . Coronary artery disease   . Hyperlipidemia   . Hypertension   . Nodule of left lung    6-mm smooothly rounded nodule  . Syncope and collapse    in 2001 and 2004 with no clear cause  . Tobacco abuse   . Viral URI with cough 11/22/2016    OBJECTIVE General Patient is awake, alert, and oriented x 3 and in no acute distress. Derm Skin is dry and supple bilateral. Negative open lesions or macerations. Remaining integument unremarkable. Nails are tender, long, thickened and dystrophic with subungual debris, consistent with onychomycosis, 1-5 bilateral. No signs of infection noted. Vasc  DP and PT pedal pulses palpable bilaterally. Temperature gradient within normal limits.  Neuro Epicritic and protective threshold sensation grossly intact bilaterally.  Musculoskeletal Exam No symptomatic pedal deformities noted bilateral. Muscular strength within normal limits.  ASSESSMENT 1. Onychodystrophic nails 1-5 bilateral with hyperkeratosis of nails.  2. Onychomycosis of nail due to dermatophyte bilateral 3. Pain in foot bilateral  PLAN OF CARE 1. Patient evaluated today.  2. Instructed to maintain good pedal hygiene and foot care.  3. Mechanical debridement of nails 1-5 bilaterally performed using a nail nipper. Filed with dremel without incident.  4. Return to clinic as needed.    Edrick Kins, DPM [facility]  Dr. Edrick Kins, [address]                                        [place], [place] [address]                Office [phone]  Fax [phone]

## 2018-02-10 ENCOUNTER — Encounter: Payer: Self-pay | Admitting: Pharmacist

## 2018-02-10 ENCOUNTER — Ambulatory Visit: Payer: Medicare Other | Admitting: Pharmacist

## 2018-02-10 DIAGNOSIS — E7800 Pure hypercholesterolemia, unspecified: Secondary | ICD-10-CM | POA: Diagnosis not present

## 2018-02-10 MED ORDER — EZETIMIBE 10 MG PO TABS: 10.0000 mg | ORAL_TABLET | Freq: Every day | ORAL | 3 refills | Status: DC

## 2018-02-10 NOTE — Patient Instructions (Signed)
START zetia (ezetimibe) 10mg  daily.   We will recheck your cholesterol panel and hepatic panel in 3 months and see you in lipid clinic a few days after to discuss results.    Cholesterol Cholesterol is a fat. Your body needs a small amount of cholesterol. Cholesterol (plaque) may build up in your blood vessels (arteries). That makes you more likely to have a heart attack or stroke. You cannot feel your cholesterol level. Having a blood test is the only way to find out if your level is high. Keep your test results. Work with your doctor to keep your cholesterol at a good level. What do the results mean?  Total cholesterol is how much cholesterol is in your blood.  LDL is bad cholesterol. This is the type that can build up. Try to have low LDL.  HDL is good cholesterol. It cleans your blood vessels and carries LDL away. Try to have high HDL.  Triglycerides are fat that the body can store or burn for energy. What are good levels of cholesterol?  Total cholesterol below 200.  LDL below 100 is good for people who have health risks. LDL below 70 is good for people who have very high risks.  HDL above 40 is good. It is best to have HDL of 60 or higher.  Triglycerides below 150. How can I lower my cholesterol? Diet Follow your diet program as told by your doctor.  Choose fish, white meat chicken, or [place] that is roasted or baked. Try not to eat red meat, fried foods, sausage, or lunch meats.  Eat lots of fresh fruits and vegetables.  Choose whole grains, beans, pasta, potatoes, and cereals.  Choose olive oil, corn oil, or canola oil. Only use small amounts.  Try not to eat butter, mayonnaise, shortening, or palm kernel oils.  Try not to eat foods with trans fats.  Choose low-fat or nonfat dairy foods. ? Drink skim or nonfat milk. ? Eat low-fat or nonfat yogurt and cheeses. ? Try not to drink whole milk or cream. ? Try not to eat ice cream, egg yolks, or full-fat  cheeses.  Healthy desserts include angel food cake, ginger snaps, animal crackers, hard candy, popsicles, and low-fat or nonfat frozen yogurt. Try not to eat pastries, cakes, pies, and cookies.  Exercise Follow your exercise program as told by your doctor.  Be more active. Try gardening, walking, and taking the stairs.  Ask your doctor about ways that you can be more active.  Medicine  Take over-the-counter and prescription medicines only as told by your doctor. This information is not intended to replace advice given to you by your health care provider. Make sure you discuss any questions you have with your health care provider. Document Released: 12/31/2008 Document Revised: 05/05/2016 Document Reviewed: 04/15/2016 Elsevier Interactive Patient Education  [employer].

## 2018-02-10 NOTE — Progress Notes (Signed)
Patient ID: Jeffrey Hudson                 DOB: [birth year]                    MRN: [id number]     HPI: Jeffrey Hudson is a 67 y.o. male patient of Dr. Angelena Hudson that presents today for lipid evaluation.  PMH includes CAD s/p anterolateral STEMI with BMS placed in large diagonal branch (2009), last cat 01/2010 with patent stent with 50% restenosis, 50% Cx, 30% distal RCA, and 60% focal PLA, COPD, tobacco use, HLD, bipolar disorder. He is currently on atorvastatin 80mg  daily. Of note on his most recent lipid/hepatic panel his LFTs were mildly elevated.   He presents today for cholesterol discussion. He states he takes his atorvastatin in the morning every day. He does not take any medications in the evening. This is the only medication he has tried for cholesterol. He is tolerating the atorvastatin without issuses.   Risk Factors: CAD s/p STEMI with BMS placed LDL Goal: <70  Current Medications: atorvastatin 80mg  daily  Intolerances:   Diet: Meals mostly from home. He eats fish, chicken, steak, and hamburger. Very little pork. He does eat vegetables regularly. He endorses fried, baked and grilled foods. He drinks mostly water.    Exercise: He walks most days.   Family History: Alcohol abuse (age of onset: 11) in his father; Dementia (age of onset: 80) in his mother; Depression in his brother.  Social History: current smoker, 2 glasses of wine per day  Labs: 01/10/18: TC 173, TG 98, HDL 64, LDL 89 (atorvastatin 80mg  daily)  Past Medical History:  Diagnosis Date  . AMI (acute myocardial infarction) ([place])    Acute ST elevation  . Bipolar disorder ([place])   . COPD (chronic obstructive pulmonary disease) ([place])   . Coronary artery disease   . Hyperlipidemia   . Hypertension   . Nodule of left lung    6-mm smooothly rounded nodule  . Syncope and collapse    in 2001 and 2004 with no clear cause  . Tobacco abuse   . Viral URI with cough 11/22/2016    Current Outpatient Medications on  File Prior to Visit  Medication Sig Dispense Refill  . albuterol (PROAIR HFA) 108 (90 BASE) MCG/ACT inhaler Inhale 2 puffs into the lungs every 6 (six) hours as needed. 1 Inhaler 0  . aspirin 81 MG tablet Take 81 mg by mouth daily.      Marland Kitchen atorvastatin (LIPITOR) 80 MG tablet Take 1 tablet (80 mg total) by mouth daily. 90 tablet 3  . FLUoxetine (PROZAC) 20 MG capsule Take 1 capsule by mouth  daily 90 capsule 1  . metoprolol succinate (TOPROL-XL) 25 MG 24 hr tablet Take 1 tablet by mouth  Daily. 90 tablet 3  . Multiple Vitamin (MULTIVITAMIN) tablet Take 1 tablet by mouth daily.      . nitroGLYCERIN (NITROSTAT) 0.4 MG SL tablet Place 0.4 mg under the tongue every 5 (five) minutes as needed for chest pain.    Marland Kitchen OLANZapine (ZYPREXA) 5 MG tablet Take 5 mg by mouth at bedtime.       No current facility-administered medications on file prior to visit.     Allergies  Allergen Reactions  . Lisinopril Swelling    Assessment/Plan: Hyperlipidemia: LDL is not at goal on max dose atorvastatin. Will continue atorvastatin 80mg  daily and add zetia 10mg  daily. Repeat lipids/LFTs in 3 months to assess  efficacy. Follow up in lipid clinic at that time to discuss PCSK9i therapy if not at goal or change in LFTs.    Thank you,  Jeffrey Hudson, [facility]  02/10/2018 7:12 AM

## 2018-03-16 ENCOUNTER — Encounter: Payer: Self-pay | Admitting: Adult Health

## 2018-03-16 ENCOUNTER — Ambulatory Visit: Payer: Medicare Other | Admitting: Adult Health

## 2018-03-16 VITALS — BP 130/70 | Temp 99.0°F | Wt 161.0 lb

## 2018-03-16 DIAGNOSIS — M2550 Pain in unspecified joint: Secondary | ICD-10-CM

## 2018-03-16 DIAGNOSIS — R52 Pain, unspecified: Secondary | ICD-10-CM | POA: Diagnosis not present

## 2018-03-16 LAB — BASIC METABOLIC PANEL
BUN: 19 mg/dL (ref 6–23)
CALCIUM: 9.7 mg/dL (ref 8.4–10.5)
CO2: 25 meq/L (ref 19–32)
CREATININE: 0.8 mg/dL (ref 0.40–1.50)
Chloride: 104 mEq/L (ref 96–112)
GFR: 102.03 mL/min (ref >60.00)
GLUCOSE: 105 mg/dL — AB (ref 70–99)
Potassium: 4.4 mEq/L (ref 3.5–5.1)
Sodium: 139 mEq/L (ref 135–145)

## 2018-03-16 LAB — HEPATIC FUNCTION PANEL
ALK PHOS: 92 U/L (ref 39–117)
ALT: 35 U/L (ref 0–53)
AST: 35 U/L (ref 0–37)
Albumin: 4.3 g/dL (ref 3.5–5.2)
BILIRUBIN TOTAL: 1.1 mg/dL (ref 0.2–1.2)
Bilirubin, Direct: 0.2 mg/dL (ref 0.0–0.3)
Total Protein: 6.5 g/dL (ref 6.0–8.3)

## 2018-03-16 LAB — MAGNESIUM: Magnesium: 1.8 mg/dL (ref 1.5–2.5)

## 2018-03-16 NOTE — Progress Notes (Signed)
Subjective:    Patient ID: Jeffrey Hudson, male    DOB: 1950/10/20, 67 y.o.   MRN: [id number]  HPI 67 year old male who  has a past medical history of AMI (acute myocardial infarction) ([place]), Bipolar disorder ([place]), COPD (chronic obstructive pulmonary disease) ([place]), Coronary artery disease, Hyperlipidemia, Hypertension, Nodule of left lung, Syncope and collapse, Tobacco abuse, and Viral URI with cough (11/22/2016).  He presents to the office today for the acute complaint of multiple joint pain and constant muscle aches throughout. He reports that his symptoms started one week ago. Joint pain most noticeable in hands and ankles.   Cardiology placed him on Zetia about one month ago to help lower LDL. Tolerates lipitor well.   Denies being outside, noticing any bug bites, rashes, or experiencing any fevers. Urine is not tea colored.   Review of Systems See HPI   Past Medical History:  Diagnosis Date  . AMI (acute myocardial infarction) ([place])    Acute ST elevation  . Bipolar disorder ([place])   . COPD (chronic obstructive pulmonary disease) ([place])   . Coronary artery disease   . Hyperlipidemia   . Hypertension   . Nodule of left lung    6-mm smooothly rounded nodule  . Syncope and collapse    in 2001 and 2004 with no clear cause  . Tobacco abuse   . Viral URI with cough 11/22/2016    Social History   Socioeconomic History  . Marital status: Married    Spouse name: Not on file  . Number of children: 1  . Years of education: Not on file  . Highest education level: Not on file  Occupational History  . Occupation: Retired-Sales/broker  Social Needs  . Financial resource strain: Not on file  . Food insecurity:    Worry: Not on file    Inability: Not on file  . Transportation needs:    Medical: Not on file    Non-medical: Not on file  Tobacco Use  . Smoking status: Current Every Day Smoker    Packs/day: 1.00    Years: 45.00    Pack years: 45.00    Types: Cigarettes  .  Smokeless tobacco: Never Used  . Tobacco comment: 1 pack a day  Substance and Sexual Activity  . Alcohol use: Yes    Alcohol/week: 0.6 oz    Types: 1 Standard drinks or equivalent per week    Comment: 2 glasses of wine daily  . Drug use: No    Comment: marijuana quit 2010. He has hx of THC use.  Marland Kitchen Sexual activity: Not on file  Lifestyle  . Physical activity:    Days per week: Not on file    Minutes per session: Not on file  . Stress: Not on file  Relationships  . Social connections:    Talks on phone: Not on file    Gets together: Not on file    Attends religious service: Not on file    Active member of club or organization: Not on file    Attends meetings of clubs or organizations: Not on file    Relationship status: Not on file  . Intimate partner violence:    Fear of current or ex partner: Not on file    Emotionally abused: Not on file    Physically abused: Not on file    Forced sexual activity: Not on file  Other Topics Concern  . Not on file  Social History Narrative   He  is not working   He has been married for 35 years    One child (son)       He likes to [employer] - plays once a week     Past Surgical History:  Procedure Laterality Date  . stent placed in heart      Family History  Problem Relation Age of Onset  . Dementia Mother 66       died  . Alcohol abuse Father 76  . Depression Brother     Allergies  Allergen Reactions  . Lisinopril Swelling    Current Outpatient Medications on File Prior to Visit  Medication Sig Dispense Refill  . albuterol (PROAIR HFA) 108 (90 BASE) MCG/ACT inhaler Inhale 2 puffs into the lungs every 6 (six) hours as needed. 1 Inhaler 0  . aspirin 81 MG tablet Take 81 mg by mouth daily.      Marland Kitchen atorvastatin (LIPITOR) 80 MG tablet Take 1 tablet (80 mg total) by mouth daily. 90 tablet 3  . ezetimibe (ZETIA) 10 MG tablet Take 1 tablet (10 mg total) by mouth daily. 90 tablet 3  . FLUoxetine (PROZAC) 20 MG capsule Take 1 capsule by  mouth  daily 90 capsule 1  . metoprolol succinate (TOPROL-XL) 25 MG 24 hr tablet Take 1 tablet by mouth  Daily. 90 tablet 3  . Multiple Vitamin (MULTIVITAMIN) tablet Take 1 tablet by mouth daily.      . nitroGLYCERIN (NITROSTAT) 0.4 MG SL tablet Place 0.4 mg under the tongue every 5 (five) minutes as needed for chest pain.    Marland Kitchen OLANZapine (ZYPREXA) 5 MG tablet Take 5 mg by mouth at bedtime.       No current facility-administered medications on file prior to visit.     BP 130/70   Temp 99 F (37.2 C) (Oral)   Wt 161 lb (73 kg)   BMI 21.84 kg/m       Objective:   Physical Exam  Constitutional: He is oriented to person, place, and time. He appears well-developed and well-nourished. No distress.  Neck: Normal range of motion. Neck supple.  Cardiovascular: Normal rate, regular rhythm, normal heart sounds and intact distal pulses. Exam reveals no gallop and no friction rub.  No murmur heard. Pulmonary/Chest: Effort normal and breath sounds normal. No stridor. No respiratory distress. He has no wheezes. He has no rales. He exhibits no tenderness.  Musculoskeletal: Normal range of motion. He exhibits tenderness. He exhibits no edema or deformity.  Lymphadenopathy:    He has no cervical adenopathy.  Neurological: He is alert and oriented to person, place, and time.  Skin: Skin is warm and dry. Capillary refill takes less than 2 seconds. He is not diaphoretic.  No rash, bullseye sign, or bug bites noted   Psychiatric: He has a normal mood and affect. His behavior is normal. Judgment and thought content normal.  Nursing note and vitals reviewed.     Assessment & Plan:  1. Generalized body aches - Possibly Zetia related but he does tolerate statin. Will check labs today. Advised to stop Zetia for the time being. Doubt tick born illness but will check for lyme. Possible arthritic issue  - Hepatic function panel - Basic Metabolic Panel - Magnesium - B. burgdorfi Antibody  2.  Polyarthralgia  - Hepatic function panel - Basic Metabolic Panel - Magnesium - B. burgdorfi Antibody  Dorothyann Peng, NP

## 2018-03-17 LAB — B. BURGDORFI ANTIBODIES

## 2018-05-01 ENCOUNTER — Ambulatory Visit: Payer: Medicare Other | Admitting: Podiatry

## 2018-05-01 ENCOUNTER — Encounter: Payer: Self-pay | Admitting: Podiatry

## 2018-05-01 DIAGNOSIS — B351 Tinea unguium: Secondary | ICD-10-CM | POA: Diagnosis not present

## 2018-05-01 DIAGNOSIS — M79676 Pain in unspecified toe(s): Secondary | ICD-10-CM

## 2018-05-08 NOTE — Progress Notes (Signed)
   SUBJECTIVE Patient presents to office today complaining of elongated, thickened nails that cause pain while ambulating in shoes. He is unable to trim his own nails. Patient is here for further evaluation and treatment.  Past Medical History:  Diagnosis Date  . AMI (acute myocardial infarction) ([place])    Acute ST elevation  . Bipolar disorder ([place])   . COPD (chronic obstructive pulmonary disease) ([place])   . Coronary artery disease   . Hyperlipidemia   . Hypertension   . Nodule of left lung    6-mm smooothly rounded nodule  . Syncope and collapse    in 2001 and 2004 with no clear cause  . Tobacco abuse   . Viral URI with cough 11/22/2016    OBJECTIVE General Patient is awake, alert, and oriented x 3 and in no acute distress. Derm Skin is dry and supple bilateral. Negative open lesions or macerations. Remaining integument unremarkable. Nails are tender, long, thickened and dystrophic with subungual debris, consistent with onychomycosis, 1-5 bilateral. No signs of infection noted. Vasc  DP and PT pedal pulses palpable bilaterally. Temperature gradient within normal limits.  Neuro Epicritic and protective threshold sensation grossly intact bilaterally.  Musculoskeletal Exam No symptomatic pedal deformities noted bilateral. Muscular strength within normal limits.  ASSESSMENT 1. Onychodystrophic nails 1-5 bilateral with hyperkeratosis of nails.  2. Onychomycosis of nail due to dermatophyte bilateral 3. Pain in foot bilateral  PLAN OF CARE 1. Patient evaluated today.  2. Instructed to maintain good pedal hygiene and foot care.  3. Mechanical debridement of nails 1-5 bilaterally performed using a nail nipper. Filed with dremel without incident.  4. Return to clinic in 3 mos.    Edrick Kins, DPM [facility]  Dr. Edrick Kins, [address]                                        [place], [place] [address]                Office [phone]  Fax [phone]

## 2018-05-15 ENCOUNTER — Other Ambulatory Visit: Payer: Self-pay

## 2018-05-17 ENCOUNTER — Ambulatory Visit: Payer: Self-pay

## 2018-06-23 ENCOUNTER — Ambulatory Visit: Payer: Self-pay | Admitting: Adult Health

## 2018-06-29 ENCOUNTER — Ambulatory Visit (INDEPENDENT_AMBULATORY_CARE_PROVIDER_SITE_OTHER): Payer: Medicare Other

## 2018-06-29 ENCOUNTER — Encounter: Payer: Self-pay | Admitting: Adult Health

## 2018-06-29 ENCOUNTER — Ambulatory Visit: Payer: Medicare Other | Admitting: Adult Health

## 2018-06-29 VITALS — BP 146/70 | Temp 98.6°F | Wt 161.0 lb

## 2018-06-29 DIAGNOSIS — M25572 Pain in left ankle and joints of left foot: Secondary | ICD-10-CM

## 2018-06-29 DIAGNOSIS — M79672 Pain in left foot: Secondary | ICD-10-CM | POA: Diagnosis not present

## 2018-06-29 DIAGNOSIS — Z23 Encounter for immunization: Secondary | ICD-10-CM | POA: Diagnosis not present

## 2018-06-29 NOTE — Progress Notes (Signed)
Subjective:    Patient ID: Jeffrey Hudson, male    DOB: Oct 25, 1950, 67 y.o.   MRN: [id number]  HPI 67 year old male who  has a past medical history of AMI (acute myocardial infarction) ([place]), Bipolar disorder ([place]), COPD (chronic obstructive pulmonary disease) ([place]), Coronary artery disease, Hyperlipidemia, Hypertension, Nodule of left lung, Syncope and collapse, Tobacco abuse, and Viral URI with cough (11/22/2016).  He presents to the office today for left ankle pain. He reports that the pain has been present for multiple months. Pain is constant, worse with walking. He reports that he has fallen two times when he feels like his ankle gives out on him. Falls did not result in injury. Denies any trauma, bruising, redness, or warmth.    Review of Systems See HPI   Past Medical History:  Diagnosis Date  . AMI (acute myocardial infarction) ([place])    Acute ST elevation  . Bipolar disorder ([place])   . COPD (chronic obstructive pulmonary disease) ([place])   . Coronary artery disease   . Hyperlipidemia   . Hypertension   . Nodule of left lung    6-mm smooothly rounded nodule  . Syncope and collapse    in 2001 and 2004 with no clear cause  . Tobacco abuse   . Viral URI with cough 11/22/2016    Social History   Socioeconomic History  . Marital status: Married    Spouse name: Not on file  . Number of children: 1  . Years of education: Not on file  . Highest education level: Not on file  Occupational History  . Occupation: Retired-Sales/broker  Social Needs  . Financial resource strain: Not on file  . Food insecurity:    Worry: Not on file    Inability: Not on file  . Transportation needs:    Medical: Not on file    Non-medical: Not on file  Tobacco Use  . Smoking status: Current Every Day Smoker    Packs/day: 1.00    Years: 45.00    Pack years: 45.00    Types: Cigarettes  . Smokeless tobacco: Never Used  . Tobacco comment: 1 pack a day  Substance and Sexual Activity  .  Alcohol use: Yes    Alcohol/week: 1.0 standard drinks    Types: 1 Standard drinks or equivalent per week    Comment: 2 glasses of wine daily  . Drug use: No    Comment: marijuana quit 2010. He has hx of THC use.  Marland Kitchen Sexual activity: Not on file  Lifestyle  . Physical activity:    Days per week: Not on file    Minutes per session: Not on file  . Stress: Not on file  Relationships  . Social connections:    Talks on phone: Not on file    Gets together: Not on file    Attends religious service: Not on file    Active member of club or organization: Not on file    Attends meetings of clubs or organizations: Not on file    Relationship status: Not on file  . Intimate partner violence:    Fear of current or ex partner: Not on file    Emotionally abused: Not on file    Physically abused: Not on file    Forced sexual activity: Not on file  Other Topics Concern  . Not on file  Social History Narrative   He is not working   He has been married for 35 years  One child (son)       He likes to [employer] - plays once a week     Past Surgical History:  Procedure Laterality Date  . stent placed in heart      Family History  Problem Relation Age of Onset  . Dementia Mother 77       died  . Alcohol abuse Father 36  . Depression Brother     Allergies  Allergen Reactions  . Lisinopril Swelling    Current Outpatient Medications on File Prior to Visit  Medication Sig Dispense Refill  . albuterol (PROAIR HFA) 108 (90 BASE) MCG/ACT inhaler Inhale 2 puffs into the lungs every 6 (six) hours as needed. 1 Inhaler 0  . aspirin 81 MG tablet Take 81 mg by mouth daily.      Marland Kitchen atorvastatin (LIPITOR) 80 MG tablet Take 1 tablet (80 mg total) by mouth daily. 90 tablet 3  . ezetimibe (ZETIA) 10 MG tablet Take 1 tablet (10 mg total) by mouth daily. 90 tablet 3  . FLUoxetine (PROZAC) 20 MG capsule Take 1 capsule by mouth  daily 90 capsule 1  . metoprolol succinate (TOPROL-XL) 25 MG 24 hr tablet Take 1  tablet by mouth  Daily. 90 tablet 3  . Multiple Vitamin (MULTIVITAMIN) tablet Take 1 tablet by mouth daily.      . nitroGLYCERIN (NITROSTAT) 0.4 MG SL tablet Place 0.4 mg under the tongue every 5 (five) minutes as needed for chest pain.    Marland Kitchen OLANZapine (ZYPREXA) 5 MG tablet Take 5 mg by mouth at bedtime.       No current facility-administered medications on file prior to visit.     BP (!) 146/70   Temp 98.6 F (37 C) (Oral)   Wt 161 lb (73 kg)   BMI 21.84 kg/m       Objective:   Physical Exam  Constitutional: He appears well-developed and well-nourished. No distress.  Cardiovascular: Normal rate, regular rhythm, normal heart sounds and intact distal pulses.  Pulmonary/Chest: Effort normal and breath sounds normal.  Musculoskeletal:       Left ankle: He exhibits no swelling, no deformity and normal pulse. Tenderness. Lateral malleolus and medial malleolus tenderness found. No head of 5th metatarsal and no proximal fibula tenderness found. Achilles tendon normal.  Neurological: He is alert.  Skin: Skin is warm and dry. He is not diaphoretic.  Psychiatric: He has a normal mood and affect. His behavior is normal. Judgment and thought content normal.  Nursing note and vitals reviewed.     Assessment & Plan:  1. Acute left ankle pain - No trauma noted. Likely arthritic pain  - Consider Mobic  - DG Ankle Complete Left; Future - DG Foot Complete Left; Future  2. Need for influenza vaccination  - Flu vaccine HIGH DOSE PF (Fluzone High dose)  Dorothyann Peng, NP

## 2018-06-29 NOTE — Patient Instructions (Signed)
It was great seeing you today   I will follow up with you regarding your xrays and then we an figure out what we need to do   Please schedule your physical

## 2018-07-03 ENCOUNTER — Other Ambulatory Visit: Payer: Self-pay

## 2018-07-03 MED ORDER — MELOXICAM 7.5 MG PO TABS: 7.5000 mg | ORAL_TABLET | Freq: Every day | ORAL | 1 refills | Status: DC

## 2018-07-10 DIAGNOSIS — H401131 Primary open-angle glaucoma, bilateral, mild stage: Secondary | ICD-10-CM | POA: Diagnosis not present

## 2018-07-19 ENCOUNTER — Ambulatory Visit (INDEPENDENT_AMBULATORY_CARE_PROVIDER_SITE_OTHER)
Admission: RE | Admit: 2018-07-19 | Discharge: 2018-07-19 | Disposition: A | Discharge status: Home / Self Care | Payer: Medicare Other | Source: Ambulatory Visit | Attending: Acute Care | Admitting: Acute Care

## 2018-07-19 DIAGNOSIS — F17210 Nicotine dependence, cigarettes, uncomplicated: Secondary | ICD-10-CM

## 2018-07-19 DIAGNOSIS — Z122 Encounter for screening for malignant neoplasm of respiratory organs: Secondary | ICD-10-CM

## 2018-07-24 DIAGNOSIS — H401121 Primary open-angle glaucoma, left eye, mild stage: Secondary | ICD-10-CM | POA: Diagnosis not present

## 2018-07-24 DIAGNOSIS — H401111 Primary open-angle glaucoma, right eye, mild stage: Secondary | ICD-10-CM | POA: Diagnosis not present

## 2018-07-28 ENCOUNTER — Telehealth: Payer: Self-pay | Admitting: Pulmonary Disease

## 2018-07-28 NOTE — Telephone Encounter (Signed)
Called and spoke to patient, made aware of results. Voiced understanding. States he will f/u with his PCP. Nothing further is needed at this time.

## 2018-07-28 NOTE — Progress Notes (Signed)
LMOMTCB x 1 

## 2018-08-01 ENCOUNTER — Other Ambulatory Visit: Payer: Self-pay | Admitting: Acute Care

## 2018-08-01 DIAGNOSIS — Z122 Encounter for screening for malignant neoplasm of respiratory organs: Secondary | ICD-10-CM

## 2018-08-01 DIAGNOSIS — Z87891 Personal history of nicotine dependence: Secondary | ICD-10-CM

## 2018-08-01 DIAGNOSIS — F17210 Nicotine dependence, cigarettes, uncomplicated: Secondary | ICD-10-CM

## 2018-08-15 DIAGNOSIS — H401131 Primary open-angle glaucoma, bilateral, mild stage: Secondary | ICD-10-CM | POA: Diagnosis not present

## 2018-08-25 ENCOUNTER — Encounter: Payer: Self-pay | Admitting: Adult Health

## 2018-08-25 ENCOUNTER — Ambulatory Visit (INDEPENDENT_AMBULATORY_CARE_PROVIDER_SITE_OTHER): Payer: Medicare Other | Admitting: Adult Health

## 2018-08-25 VITALS — BP 138/86 | HR 88 | Temp 98.2°F | Wt 169.0 lb

## 2018-08-25 DIAGNOSIS — I10 Essential (primary) hypertension: Secondary | ICD-10-CM | POA: Diagnosis not present

## 2018-08-25 DIAGNOSIS — F17200 Nicotine dependence, unspecified, uncomplicated: Secondary | ICD-10-CM

## 2018-08-25 DIAGNOSIS — Z0000 Encounter for general adult medical examination without abnormal findings: Secondary | ICD-10-CM

## 2018-08-25 DIAGNOSIS — E7800 Pure hypercholesterolemia, unspecified: Secondary | ICD-10-CM

## 2018-08-25 DIAGNOSIS — F3170 Bipolar disorder, currently in remission, most recent episode unspecified: Secondary | ICD-10-CM

## 2018-08-25 DIAGNOSIS — Z125 Encounter for screening for malignant neoplasm of prostate: Secondary | ICD-10-CM

## 2018-08-25 DIAGNOSIS — J432 Centrilobular emphysema: Secondary | ICD-10-CM | POA: Diagnosis not present

## 2018-08-25 LAB — CBC WITH DIFFERENTIAL/PLATELET
BASOS PCT: 2.3 % (ref 0.0–3.0)
Basophils Absolute: 0.1 10*3/uL (ref 0.0–0.1)
Eosinophils Absolute: 0.4 10*3/uL (ref 0.0–0.7)
Eosinophils Relative: 6.4 % — ABNORMAL HIGH (ref 0.0–5.0)
HCT: 41.6 % (ref 39.0–52.0)
HEMOGLOBIN: 14.5 g/dL (ref 13.0–17.0)
LYMPHS ABS: 1.2 10*3/uL (ref 0.7–4.0)
Lymphocytes Relative: 19.5 % (ref 12.0–46.0)
MCHC: 34.7 g/dL (ref 30.0–36.0)
MCV: 98.8 fl (ref 78.0–100.0)
MONOS PCT: 13.8 % — AB (ref 3.0–12.0)
Monocytes Absolute: 0.8 10*3/uL (ref 0.1–1.0)
Neutro Abs: 3.5 10*3/uL (ref 1.4–7.7)
Neutrophils Relative %: 58 % (ref 43.0–77.0)
Platelets: 211 10*3/uL (ref 150.0–400.0)
RBC: 4.22 Mil/uL (ref 4.22–5.81)
RDW: 13.4 % (ref 11.5–15.5)
WBC: 6 10*3/uL (ref 4.0–10.5)

## 2018-08-25 LAB — COMPREHENSIVE METABOLIC PANEL
ALBUMIN: 4.2 g/dL (ref 3.5–5.2)
ALK PHOS: 95 U/L (ref 39–117)
ALT: 30 U/L (ref 0–53)
AST: 32 U/L (ref 0–37)
BUN: 22 mg/dL (ref 6–23)
CHLORIDE: 104 meq/L (ref 96–112)
CO2: 27 mEq/L (ref 19–32)
Calcium: 9.7 mg/dL (ref 8.4–10.5)
Creatinine, Ser: 0.94 mg/dL (ref 0.40–1.50)
GFR: 84.6 mL/min (ref >60.00)
Glucose, Bld: 113 mg/dL — ABNORMAL HIGH (ref 70–99)
POTASSIUM: 4.4 meq/L (ref 3.5–5.1)
Sodium: 140 mEq/L (ref 135–145)
TOTAL PROTEIN: 6.5 g/dL (ref 6.0–8.3)
Total Bilirubin: 1 mg/dL (ref 0.2–1.2)

## 2018-08-25 LAB — PSA: PSA: 0.36 ng/mL (ref 0.10–4.00)

## 2018-08-25 LAB — LIPID PANEL
CHOLESTEROL: 195 mg/dL (ref 0–200)
HDL: 64.9 mg/dL (ref >39.00)
LDL Cholesterol: 102 mg/dL — ABNORMAL HIGH (ref 0–99)
NonHDL: 129.97
Total CHOL/HDL Ratio: 3
Triglycerides: 141 mg/dL (ref 0.0–149.0)
VLDL: 28.2 mg/dL (ref 0.0–40.0)

## 2018-08-25 LAB — HEMOGLOBIN A1C: Hgb A1c MFr Bld: 6.1 % (ref 4.6–6.5)

## 2018-08-25 LAB — TSH: TSH: 2.88 u[IU]/mL (ref 0.35–4.50)

## 2018-08-25 NOTE — Progress Notes (Signed)
Subjective:    Patient ID: Jeffrey Hudson, male    DOB: Jul 23, 1951, 67 y.o.   MRN: [id number]  HPI  Patient presents for yearly preventative medicine examination. He is a pleasant 67 year old male who  has a past medical history of AMI (acute myocardial infarction) ([place]), Bipolar disorder ([place]), COPD (chronic obstructive pulmonary disease) ([place]), Coronary artery disease, Hyperlipidemia, Hypertension, Nodule of left lung, Syncope and collapse, Tobacco abuse, and Viral URI with cough (11/22/2016).  Hyperlipidemia/ CAD s/p anterolateral STEMI with BMS placed in large diagonal branch (2009), last cat 01/2010 with patent stent. Is followed by cardiology. Currently prescribed Lipitor 80 mg, aspirin 81 mg, and Zetia 10 mg daily  Hypertension - Takes Toprol 25 mg daily  BP Readings from Last 3 Encounters:  08/25/18 138/86  06/29/18 (!) 146/70  03/16/18 130/70   COPD- followed by Pulmonary. He has not had to use his inhaler. Has shortness of breath with exercise.   Tobacco Use - continues to smoke but reports that he has cut back quite a bit. He is using patches. Currently smoking about 50% of what he has in the past. He has had a recent low dose Chest CT done with showed 1. Lung-RADS 2, benign appearance or behavior. Continue annual screening with low-dose chest CT without contrast in 12 months. 2. Three-vessel coronary atherosclerosis. 3. Diffuse hepatic steatosis.  Bipolar disroder - is followed by psychiatry. Currently well maintained on Zyprexa and Prozac.   Glaucoma - was diagnosed 2-3 months ago and was prescribed two different drops. He is being followed by Alois Cliche.   All immunizations and health maintenance protocols were reviewed with the patient and needed orders were placed.  Vaccinations are up-to-date  Appropriate screening laboratory values were ordered for the patient including screening of hyperlipidemia, renal function and hepatic function. If indicated by BPH, a PSA was  ordered.  Medication reconciliation,  past medical history, social history, problem list and allergies were reviewed in detail with the patient  Goals were established with regard to weight loss, exercise, and  diet in compliance with medications. He walking every day, but does not go more than a mile. He is trying to eat healthy.  Wt Readings from Last 3 Encounters:  08/25/18 169 lb (76.7 kg)  06/29/18 161 lb (73 kg)  03/16/18 161 lb (73 kg)   End of life planning was discussed.  He is up-to-date on routine colonoscopy, dental and vision screens.  Review of Systems  Constitutional: Negative.   HENT: Negative.   Eyes: Negative.   Respiratory: Negative.   Cardiovascular: Negative.   Gastrointestinal: Negative.   Endocrine: Negative.   Genitourinary: Negative.   Musculoskeletal: Negative.   Skin: Negative.   Allergic/Immunologic: Negative.   Neurological: Negative.   Hematological: Negative.   Psychiatric/Behavioral: Negative.   All other systems reviewed and are negative.  Past Medical History:  Diagnosis Date  . AMI (acute myocardial infarction) ([place])    Acute ST elevation  . Bipolar disorder ([place])   . COPD (chronic obstructive pulmonary disease) ([place])   . Coronary artery disease   . Hyperlipidemia   . Hypertension   . Nodule of left lung    6-mm smooothly rounded nodule  . Syncope and collapse    in 2001 and 2004 with no clear cause  . Tobacco abuse   . Viral URI with cough 11/22/2016    Social History   Socioeconomic History  . Marital status: Married    Spouse name: Not  on file  . Number of children: 1  . Years of education: Not on file  . Highest education level: Not on file  Occupational History  . Occupation: Retired-Sales/broker  Social Needs  . Financial resource strain: Not on file  . Food insecurity:    Worry: Not on file    Inability: Not on file  . Transportation needs:    Medical: Not on file    Non-medical: Not on file  Tobacco Use  .  Smoking status: Current Every Day Smoker    Packs/day: 1.00    Years: 45.00    Pack years: 45.00    Types: Cigarettes  . Smokeless tobacco: Never Used  . Tobacco comment: 1 pack a day  Substance and Sexual Activity  . Alcohol use: Yes    Alcohol/week: 1.0 standard drinks    Types: 1 Standard drinks or equivalent per week    Comment: 2 glasses of wine daily  . Drug use: No    Comment: marijuana quit 2010. He has hx of THC use.  Marland Kitchen Sexual activity: Not on file  Lifestyle  . Physical activity:    Days per week: Not on file    Minutes per session: Not on file  . Stress: Not on file  Relationships  . Social connections:    Talks on phone: Not on file    Gets together: Not on file    Attends religious service: Not on file    Active member of club or organization: Not on file    Attends meetings of clubs or organizations: Not on file    Relationship status: Not on file  . Intimate partner violence:    Fear of current or ex partner: Not on file    Emotionally abused: Not on file    Physically abused: Not on file    Forced sexual activity: Not on file  Other Topics Concern  . Not on file  Social History Narrative   He is not working   He has been married for 35 years    One child (son)       He likes to [employer] - plays once a week     Past Surgical History:  Procedure Laterality Date  . stent placed in heart      Family History  Problem Relation Age of Onset  . Dementia Mother 37       died  . Alcohol abuse Father 20  . Depression Brother     Allergies  Allergen Reactions  . Lisinopril Swelling    Current Outpatient Medications on File Prior to Visit  Medication Sig Dispense Refill  . albuterol (PROAIR HFA) 108 (90 BASE) MCG/ACT inhaler Inhale 2 puffs into the lungs every 6 (six) hours as needed. 1 Inhaler 0  . aspirin 81 MG tablet Take 81 mg by mouth daily.      Marland Kitchen atorvastatin (LIPITOR) 80 MG tablet Take 1 tablet (80 mg total) by mouth daily. 90 tablet 3  .  ezetimibe (ZETIA) 10 MG tablet Take 1 tablet (10 mg total) by mouth daily. 90 tablet 3  . FLUoxetine (PROZAC) 20 MG capsule Take 1 capsule by mouth  daily 90 capsule 1  . meloxicam (MOBIC) 7.5 MG tablet Take 1 tablet (7.5 mg total) by mouth daily. 30 tablet 1  . metoprolol succinate (TOPROL-XL) 25 MG 24 hr tablet Take 1 tablet by mouth  Daily. 90 tablet 3  . Multiple Vitamin (MULTIVITAMIN) tablet Take 1 tablet by mouth daily.      Marland Kitchen  nitroGLYCERIN (NITROSTAT) 0.4 MG SL tablet Place 0.4 mg under the tongue every 5 (five) minutes as needed for chest pain.    Marland Kitchen OLANZapine (ZYPREXA) 5 MG tablet Take 5 mg by mouth at bedtime.       No current facility-administered medications on file prior to visit.     BP 138/86   Pulse 88   Temp 98.2 F (36.8 C)   Wt 169 lb (76.7 kg)   BMI 22.92 kg/m       Objective:   Physical Exam  Constitutional: He is oriented to person, place, and time. He appears well-developed and well-nourished. No distress.  Overweight    HENT:  Head: Normocephalic and atraumatic.  Right Ear: External ear normal.  Left Ear: External ear normal.  Nose: Nose normal.  Mouth/Throat: Oropharynx is clear and moist. No oropharyngeal exudate.  Eyes: Pupils are equal, round, and reactive to light. Conjunctivae and EOM are normal. Right eye exhibits no discharge. Left eye exhibits no discharge. No scleral icterus.  Neck: Normal range of motion. Neck supple. No JVD present. No tracheal deviation present. No thyromegaly present.  Cardiovascular: Normal rate, regular rhythm, normal heart sounds and intact distal pulses. Exam reveals no gallop and no friction rub.  No murmur heard. Pulmonary/Chest: Effort normal and breath sounds normal. No stridor. No respiratory distress. He has no wheezes. He has no rales. He exhibits no tenderness.  Abdominal: Soft. Bowel sounds are normal. He exhibits no distension and no mass. There is no tenderness. There is no rebound and no guarding. No hernia.    Genitourinary:  Genitourinary Comments: Will do PSA  Musculoskeletal: Normal range of motion. He exhibits no edema, tenderness or deformity.  Lymphadenopathy:    He has no cervical adenopathy.  Neurological: He is alert and oriented to person, place, and time. He displays normal reflexes. No cranial nerve deficit or sensory deficit. He exhibits normal muscle tone. Coordination normal.  Skin: Skin is warm and dry. Capillary refill takes less than 2 seconds. No rash noted. He is not diaphoretic. No erythema. No pallor.  Psychiatric: He has a normal mood and affect. Judgment and thought content normal.  Nursing note and vitals reviewed.     Assessment & Plan:  1. Routine general medical examination at a health care facility - One year follow up or sooner if needed - Continue to work on diet and exercise - Needs to quit smoking  - CBC with Differential/Platelet - Comprehensive metabolic panel - Lipid panel - PSA - TSH - Hemoglobin A1c  2. Centrilobular emphysema ([place]) - Pulmonary follow up PRN  - needs to quit smoking   3. Essential hypertension - At goal  - No change in medications  - CBC with Differential/Platelet - Comprehensive metabolic panel - Lipid panel  - TSH - Hemoglobin A1c  4. Pure hypercholesterolemia - Continue with cardiology plan of care - Encouraged aerobic exercise and heart healthy diet  - CBC with Differential/Platelet - Comprehensive metabolic panel - Lipid panel - TSH - Hemoglobin A1c  5. TOBACCO ABUSE - Encouraged to quit smoking  6. Bipolar affective disorder in remission [facility]) - Continue with psychiatry plan of care   Dorothyann Peng, NP

## 2018-08-28 ENCOUNTER — Ambulatory Visit: Payer: Medicare Other | Admitting: Podiatry

## 2018-08-28 DIAGNOSIS — M79676 Pain in unspecified toe(s): Secondary | ICD-10-CM

## 2018-08-28 DIAGNOSIS — M19072 Primary osteoarthritis, left ankle and foot: Secondary | ICD-10-CM

## 2018-08-28 DIAGNOSIS — M659 Synovitis and tenosynovitis, unspecified: Secondary | ICD-10-CM | POA: Diagnosis not present

## 2018-08-28 DIAGNOSIS — B351 Tinea unguium: Secondary | ICD-10-CM

## 2018-08-29 NOTE — Progress Notes (Signed)
   SUBJECTIVE Patient presents to office today complaining of elongated, thickened nails that cause pain while ambulating in shoes. He is unable to trim his own nails.  He also has a new complaint of throbbing pain to the medial left ankle that has been ongoing for several years. He states he was diagnosed with arthritis in the past. Walking and bearing weight increases the pain. He has been taking Meloxicam for treatment. Patient is here for further evaluation and treatment.  Past Medical History:  Diagnosis Date  . AMI (acute myocardial infarction) ([place])    Acute ST elevation  . Bipolar disorder ([place])   . COPD (chronic obstructive pulmonary disease) ([place])   . Coronary artery disease   . Hyperlipidemia   . Hypertension   . Nodule of left lung    6-mm smooothly rounded nodule  . Syncope and collapse    in 2001 and 2004 with no clear cause  . Tobacco abuse   . Viral URI with cough 11/22/2016    OBJECTIVE General Patient is awake, alert, and oriented x 3 and in no acute distress. Derm Skin is dry and supple bilateral. Negative open lesions or macerations. Remaining integument unremarkable. Nails are tender, long, thickened and dystrophic with subungual debris, consistent with onychomycosis, 1-5 bilateral. No signs of infection noted. Vasc  DP and PT pedal pulses palpable bilaterally. Temperature gradient within normal limits.  Neuro Epicritic and protective threshold sensation grossly intact bilaterally.  Musculoskeletal Exam Pain with palpation to the anterior, medial and lateral aspects of the left ankle joint. No symptomatic pedal deformities noted bilateral. Muscular strength within normal limits.  ASSESSMENT 1. Onychodystrophic nails 1-5 bilateral with hyperkeratosis of nails.  2. Onychomycosis of nail due to dermatophyte bilateral 3. Left ankle DJD/synovitis   PLAN OF CARE 1. Patient evaluated today.  2. Instructed to maintain good pedal hygiene and foot care.  3. Mechanical  debridement of nails 1-5 bilaterally performed using a nail nipper. Filed with dremel without incident.  4. Injection of 0.5 mLs Celestone Soluspan injected into the left ankle joint.  5. Continue taking Meloxicam as directed by PCP.  6. Return to clinic in 4 mos.    Edrick Kins, DPM [facility]  Dr. Edrick Kins, [address]                                        [place], [place] [address]                Office [phone]  Fax [phone]

## 2018-11-23 DIAGNOSIS — H401131 Primary open-angle glaucoma, bilateral, mild stage: Secondary | ICD-10-CM | POA: Diagnosis not present

## 2018-12-08 ENCOUNTER — Institutional Professional Consult (permissible substitution): Payer: Self-pay | Admitting: Pulmonary Disease

## 2018-12-25 ENCOUNTER — Encounter: Payer: Self-pay | Admitting: Podiatry

## 2018-12-25 ENCOUNTER — Ambulatory Visit: Payer: Medicare Other | Admitting: Podiatry

## 2018-12-25 DIAGNOSIS — M19072 Primary osteoarthritis, left ankle and foot: Secondary | ICD-10-CM

## 2018-12-25 DIAGNOSIS — B351 Tinea unguium: Secondary | ICD-10-CM

## 2018-12-25 DIAGNOSIS — M65972 Unspecified synovitis and tenosynovitis, left ankle and foot: Secondary | ICD-10-CM

## 2018-12-25 DIAGNOSIS — M659 Synovitis and tenosynovitis, unspecified: Secondary | ICD-10-CM

## 2018-12-25 DIAGNOSIS — M79676 Pain in unspecified toe(s): Secondary | ICD-10-CM | POA: Diagnosis not present

## 2018-12-27 ENCOUNTER — Encounter: Payer: Self-pay | Admitting: Pulmonary Disease

## 2018-12-27 ENCOUNTER — Other Ambulatory Visit: Payer: Self-pay

## 2018-12-27 ENCOUNTER — Ambulatory Visit: Payer: Medicare Other | Admitting: Pulmonary Disease

## 2018-12-27 ENCOUNTER — Telehealth: Payer: Self-pay | Admitting: Pulmonary Disease

## 2018-12-27 DIAGNOSIS — J432 Centrilobular emphysema: Secondary | ICD-10-CM | POA: Diagnosis not present

## 2018-12-27 DIAGNOSIS — R918 Other nonspecific abnormal finding of lung field: Secondary | ICD-10-CM

## 2018-12-27 MED ORDER — ALBUTEROL SULFATE HFA 108 (90 BASE) MCG/ACT IN AERS: 2.0000 | INHALATION_SPRAY | Freq: Four times a day (QID) | RESPIRATORY_TRACT | 0 refills | Status: DC | PRN

## 2018-12-27 NOTE — Assessment & Plan Note (Addendum)
emphysema on  CT scan. Lung function has dropped from FEV1 of 81%, 2.59 prior to 2.20/61% -some of this variation is related to difference in height entered Main message is to quit smoking Refill on albuterol

## 2018-12-27 NOTE — Progress Notes (Signed)
Subjective:    Patient ID: Jeffrey Hudson, male    DOB: 09-09-1951, 68 y.o.   MRN: [id number]  HPI  Chief Complaint  Patient presents with  . Pulm Consult    Re-establish for COPD. States he needs a refill on his albuterol inhaler.    68 year old smoker presents to reestablish care for management of COPD/emphysema. He started smoking as a teenager and continues to smoke about half pack per day, more than 45 pack years.  PFTs in the past have shown only mild airway obstruction with decreased DLCO consistent with emphysema. He is undergone low-dose CT screening since 2016 which have showed stable pulmonary nodules.  These date back to 2004 and are likely benign.  I personally reviewed last set CT from 07/2018 which also shows moderate emphysema.  He needs prescription for albuterol which he uses about once or twice a month.  He denies significant dyspnea on exertion, wheezing or recurrent chest colds There is no history of chest pain or pedal edema. His bipolar symptoms are controlled on Zyprexa and fluoxetine.  He has a history of mild OSA for which he underwent UPPP in the remote past.  He does not report with no snoring or non-refreshing sleep or sleep pressure in the daytime  Spirometry today showed ratio of 71, FEV1 of 61% and FVC of 63% consistent with moderate airway obstruction and drop in lung function compared to prior (ht 72 )   Significant tests/ events reviewed  PFTs 03/2012 - FEV1 of 2.81- 99% FVC of 212% and ratio  of 63. Smaller airways were decreased at 41%. Lung volumes were preserved with DLCO 14.2-71%.   Spirometry 2015 - FEV1 of 2.59 -81% and FVC of 3.34-82% with ratio 78.  (ht 66)  CT chest in 2004 showed 6 mm nodule in the left lower lobe.but was also noted in 1995.  LDCT 12/2014 Numerous scattered noncalcified pulmonary nodules, measuring up to 7 mm in the left lower lobe, mild to moderate hepatic steatosis  Past Medical History:  Diagnosis Date  . AMI  (acute myocardial infarction) ([place])    Acute ST elevation  . Bipolar disorder ([place])   . COPD (chronic obstructive pulmonary disease) ([place])   . Coronary artery disease   . Hyperlipidemia   . Hypertension   . Nodule of left lung    6-mm smooothly rounded nodule  . Syncope and collapse    in 2001 and 2004 with no clear cause  . Tobacco abuse   . Viral URI with cough 11/22/2016    Past Surgical History:  Procedure Laterality Date  . stent placed in heart      Allergies  Allergen Reactions  . Lisinopril Swelling    Social History   Socioeconomic History  . Marital status: Married    Spouse name: Not on file  . Number of children: 1  . Years of education: Not on file  . Highest education level: Not on file  Occupational History  . Occupation: Retired-Sales/broker  Social Needs  . Financial resource strain: Not on file  . Food insecurity:    Worry: Not on file    Inability: Not on file  . Transportation needs:    Medical: Not on file    Non-medical: Not on file  Tobacco Use  . Smoking status: Current Every Day Smoker    Packs/day: 1.00    Years: 45.00    Pack years: 45.00    Types: Cigarettes  . Smokeless tobacco: Never Used  .  Tobacco comment: 1 pack a day  Substance and Sexual Activity  . Alcohol use: Yes    Alcohol/week: 1.0 standard drinks    Types: 1 Standard drinks or equivalent per week    Comment: 2 glasses of wine daily  . Drug use: No    Comment: marijuana quit 2010. He has hx of THC use.  Marland Kitchen Sexual activity: Not on file  Lifestyle  . Physical activity:    Days per week: Not on file    Minutes per session: Not on file  . Stress: Not on file  Relationships  . Social connections:    Talks on phone: Not on file    Gets together: Not on file    Attends religious service: Not on file    Active member of club or organization: Not on file    Attends meetings of clubs or organizations: Not on file    Relationship status: Not on file  . Intimate partner  violence:    Fear of current or ex partner: Not on file    Emotionally abused: Not on file    Physically abused: Not on file    Forced sexual activity: Not on file  Other Topics Concern  . Not on file  Social History Narrative   He is not working   He has been married for 35 years    One child (son)       He likes to [employer] - plays once a week       Family History  Problem Relation Age of Onset  . Dementia Mother 6       died  . Alcohol abuse Father 76  . Depression Brother        Review of Systems  Constitutional: Negative for fever and unexpected weight change.  HENT: Positive for congestion. Negative for dental problem, ear pain, nosebleeds, postnasal drip, rhinorrhea, sinus pressure, sneezing, sore throat and trouble swallowing.   Eyes: Negative for redness and itching.  Respiratory: Positive for shortness of breath. Negative for cough, chest tightness and wheezing.   Cardiovascular: Negative for palpitations and leg swelling.  Gastrointestinal: Negative for nausea and vomiting.  Genitourinary: Negative for dysuria.  Musculoskeletal: Positive for joint swelling.  Skin: Negative for rash.  Allergic/Immunologic: Negative.  Negative for environmental allergies, food allergies and immunocompromised state.  Neurological: Negative for headaches.  Hematological: Does not bruise/bleed easily.  Psychiatric/Behavioral: Negative for dysphoric mood. The patient is not nervous/anxious.        Objective:   Physical Exam   Gen. Pleasant, well-nourished, in no distress, normal affect ENT - no pallor,icterus, no post nasal drip, s/p UPPP Neck: No JVD, no thyromegaly, no carotid bruits Lungs: no use of accessory muscles, no dullness to percussion, clear without rales or rhonchi  Cardiovascular: Rhythm regular, heart sounds  normal, no murmurs or gallops, no peripheral edema Abdomen: soft and non-tender, no hepatosplenomegaly, BS normal. Musculoskeletal: No deformities, no  cyanosis or clubbing Neuro:  alert, non focal        Assessment & Plan:

## 2018-12-27 NOTE — Patient Instructions (Addendum)
You have emphysema on your CT scan. Lung function has dropped  Main message is to quit smoking Refill on albuterol

## 2018-12-27 NOTE — Assessment & Plan Note (Signed)
Continue annual low-dose CT screening in this active smoker

## 2018-12-27 NOTE — Telephone Encounter (Signed)
Lung function has dropped from FEV1 of 81%, 2.59 prior to 2.20/61% This gives an urgency to his quitting smoking

## 2018-12-28 ENCOUNTER — Telehealth: Payer: Self-pay | Admitting: Pulmonary Disease

## 2018-12-28 MED ORDER — ALBUTEROL SULFATE HFA 108 (90 BASE) MCG/ACT IN AERS: 2.0000 | INHALATION_SPRAY | Freq: Four times a day (QID) | RESPIRATORY_TRACT | 3 refills | Status: AC | PRN

## 2018-12-28 NOTE — Telephone Encounter (Signed)
Called and spoke with Patient.  Patient stated [facility] had not received his Albuterol refill.  Albuterol refill was sent 12/27/18, as sample.  New order for Albuterol refill sent to [facility].  Nothing further needed at this time.  12/27/18-Dr Alva emphysema on  CT scan. Lung function has dropped from FEV1 of 81%, 2.59 prior to 2.20/61% -some of this variation is related to difference in height entered Main message is to quit smoking Refill on albuterol

## 2018-12-28 NOTE — Progress Notes (Signed)
   SUBJECTIVE Patient presents to office today complaining of elongated, thickened nails that cause pain while ambulating in shoes. He is unable to trim his own nails.  He is also here for follow up evaluation of left ankle pain. He states his symptoms have resolved and denies any pain at this time. He has been taking Meloxicam and states the injection he received at his last visit was very helpful. He denies worsening factors. Patient is here for further evaluation and treatment.  Past Medical History:  Diagnosis Date  . AMI (acute myocardial infarction) ([place])    Acute ST elevation  . Bipolar disorder ([place])   . COPD (chronic obstructive pulmonary disease) ([place])   . Coronary artery disease   . Hyperlipidemia   . Hypertension   . Nodule of left lung    6-mm smooothly rounded nodule  . Syncope and collapse    in 2001 and 2004 with no clear cause  . Tobacco abuse   . Viral URI with cough 11/22/2016    OBJECTIVE General Patient is awake, alert, and oriented x 3 and in no acute distress. Derm Skin is dry and supple bilateral. Negative open lesions or macerations. Remaining integument unremarkable. Nails are tender, long, thickened and dystrophic with subungual debris, consistent with onychomycosis, 1-5 bilateral. No signs of infection noted. Vasc  DP and PT pedal pulses palpable bilaterally. Temperature gradient within normal limits.  Neuro Epicritic and protective threshold sensation grossly intact bilaterally.  Musculoskeletal Exam No symptomatic pedal deformities noted bilateral. Muscular strength within normal limits.  ASSESSMENT 1. Onychodystrophic nails 1-5 bilateral with hyperkeratosis of nails.  2. Onychomycosis of nail due to dermatophyte bilateral 3. Left ankle DJD/synovitis - resolved   PLAN OF CARE 1. Patient evaluated today.  2. Instructed to maintain good pedal hygiene and foot care.  3. Mechanical debridement of nails 1-5 bilaterally performed using a nail nipper. Filed  with dremel without incident.  4. Continue taking Meloxicam as directed by PCP.  5. Return to clinic in 3 mos.    Edrick Kins, DPM [facility]  Dr. Edrick Kins, [address]                                        [place], [place] [address]                Office [phone]  Fax [phone]

## 2018-12-28 NOTE — Telephone Encounter (Signed)
Called and spoke with Patient.  Patient stated [facility] had not received his Albuterol refill.  Albuterol refill was sent 12/27/18, as sample.  New order for Albuterol refill sent to [facility]

## 2018-12-28 NOTE — Telephone Encounter (Signed)
Spoke with pt and will begin a PA for albuterol.

## 2018-12-29 NOTE — Telephone Encounter (Signed)
Called [facility] to get PA info so can initiate PA  Spoke with Hina  She states no PA needed  Spoke with pt and notified of this  He states he was made aware this morning by [facility]  Nothing further needed

## 2019-01-17 ENCOUNTER — Telehealth: Payer: Self-pay | Admitting: Cardiovascular Disease

## 2019-01-17 NOTE — Telephone Encounter (Signed)
Pt was scheduled for yearly follow up on 02/05/19 but cancelled secondary to current COVID-19 pandemic. Pt is not interested in virtual visit and prefers to reschedule in several months, when safe to do so. Pt aware office will contact him when ok to reschedule in-office-visit

## 2019-01-17 NOTE — Telephone Encounter (Signed)
lmtcb

## 2019-01-17 NOTE — Telephone Encounter (Signed)
New Message          Patient called to reschedule his appt in July, however; the schedule is asking for verification first Pls call and advise.

## 2019-01-18 ENCOUNTER — Other Ambulatory Visit: Payer: Self-pay | Admitting: Cardiovascular Disease

## 2019-01-18 MED ORDER — ATORVASTATIN CALCIUM 80 MG PO TABS: 80.0000 mg | ORAL_TABLET | Freq: Every day | ORAL | 1 refills | Status: DC

## 2019-02-05 ENCOUNTER — Ambulatory Visit: Payer: Self-pay | Admitting: Cardiovascular Disease

## 2019-02-22 ENCOUNTER — Other Ambulatory Visit: Payer: Self-pay | Admitting: Cardiovascular Disease

## 2019-02-22 MED ORDER — METOPROLOL SUCCINATE ER 25 MG PO TB24: ORAL_TABLET | ORAL | 0 refills | Status: DC

## 2019-02-27 ENCOUNTER — Ambulatory Visit (INDEPENDENT_AMBULATORY_CARE_PROVIDER_SITE_OTHER): Payer: Medicare Other | Admitting: Adult Health

## 2019-02-27 ENCOUNTER — Encounter: Payer: Self-pay | Admitting: Adult Health

## 2019-02-27 ENCOUNTER — Other Ambulatory Visit: Payer: Self-pay

## 2019-02-27 DIAGNOSIS — F3170 Bipolar disorder, currently in remission, most recent episode unspecified: Secondary | ICD-10-CM

## 2019-02-27 NOTE — Progress Notes (Signed)
Virtual Visit via Telephone Note  I connected with Jeffrey Hudson on 02/27/19 at  3:00 PM EDT by telephone and verified that I am speaking with the correct person using two identifiers.   I discussed the limitations, risks, security and privacy concerns of performing an evaluation and management service by telephone and the availability of in person appointments. I also discussed with the patient that there may be a patient responsible charge related to this service. The patient expressed understanding and agreed to proceed.  Location patient: home Location provider: work or home office Participants present for the call: patient, provider Patient did not have a visit in the prior 7 days to address this/these issue(s).   History of Present Illness: 68 year old male who  has a past medical history of AMI (acute myocardial infarction) ([place]), Bipolar disorder ([place]), COPD (chronic obstructive pulmonary disease) ([place]), Coronary artery disease, Hyperlipidemia, Hypertension, Nodule of left lung, Syncope and collapse, Tobacco abuse, and Viral URI with cough (11/22/2016).  He is being evaluated today for bipolar disorder.  He is currently being seen and managed at [facility] behavioral health.  He on Zyprexa and Prozac and feels as though he is well controlled.  He would like to be referred to [facility] behavioral health as he thinks that he will be able to get better care than he does at [facility].  He denies any issues currently, he is not feeling depressed or suicidal.   Observations/Objective: Patient sounds cheerful and well on the phone. I do not appreciate any SOB. Speech and thought processing are grossly intact. Patient reported vitals:  Assessment and Plan: 1. Bipolar affective disorder in remission [facility]) -We will refer to [facility] at patient's request.  He needs nothing further.  Advise follow-up as needed - Ambulatory referral to Psychiatry   Follow Up Instructions:  I did  not refer this patient for an OV in the next 24 hours for this/these issue(s).  I discussed the assessment and treatment plan with the patient. The patient was provided an opportunity to ask questions and all were answered. The patient agreed with the plan and demonstrated an understanding of the instructions.   The patient was advised to call back or seek an in-person evaluation if the symptoms worsen or if the condition fails to improve as anticipated.  I provided 15  minutes of non-face-to-face time during this encounter.   Dorothyann Peng, NP

## 2019-04-06 ENCOUNTER — Other Ambulatory Visit: Payer: Self-pay

## 2019-04-06 ENCOUNTER — Encounter (HOSPITAL_COMMUNITY): Payer: Self-pay | Admitting: Psychiatry

## 2019-04-06 ENCOUNTER — Ambulatory Visit (INDEPENDENT_AMBULATORY_CARE_PROVIDER_SITE_OTHER): Payer: Medicare Other | Admitting: Psychiatry

## 2019-04-06 VITALS — BP 128/74 | HR 65 | Temp 98.5°F | Ht 66.0 in | Wt 167.0 lb

## 2019-04-06 DIAGNOSIS — F319 Bipolar disorder, unspecified: Secondary | ICD-10-CM

## 2019-04-06 MED ORDER — OLANZAPINE 5 MG PO TABS: 5.0000 mg | ORAL_TABLET | Freq: Every day | ORAL | 1 refills | Status: DC

## 2019-04-06 MED ORDER — FLUOXETINE HCL 20 MG PO CAPS: 20.0000 mg | ORAL_CAPSULE | Freq: Every day | ORAL | 1 refills | Status: DC

## 2019-04-06 NOTE — Progress Notes (Signed)
Psychiatric Initial Adult Assessment   Patient Identification: Jeffrey Hudson MRN:  [id number] Date of Evaluation:  04/06/2019 Referral Source: Dr. Zettie Pho Chief Complaint:   Visit Diagnosis: Bipolar disorder  History of Present Illness:    This patient is a 68 year old white married father who is retired and comes seeking new care.  He needs a new provider.  He was under the care of [facility].  He was unhappy with their care.  The patient for the last 2 decades has been stable taking Zyprexa 5 mg and Prozac 20 mg.  He was diagnosed 20 years ago with bipolar disorder by Dr. Laren Everts at [facility] counseling center.  This patient is happily married.  Is a very stable marriage.  He has 1 son who is in the [employer] and is doing very well.  He is an occupational therapist.  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  Generally his health is fairly good.  He does have hypercholesterolemia and hypertension but it seems to be well controlled.  The patient smokes 1 pack a day.  The patient denies daily depression.  He is sleeping and eating well.  He is got good energy.  He has no problems thinking and concentrating.  Is a good sense of work.  He denies being suicidal now and never has been.  The patient enjoys playing golf pool and getting on the computer a lot. The patient denies the use of alcohol or drugs in a significant way.  He does drink 2 or 3 glasses of wine every night.  He is never had an issue with alcohol.  He denies ever being psychotic.  40 years ago he had an episode of persistent daily depression with vegetative symptoms.  He said he has had a number of depressive episodes before that but really has been symptom-free in terms of depression.  15 or 20 years ago he presented with mania.  He had extreme euphoria elation and increase self-esteem racing thinking and was very distractible.  At that time he was seen in an outpatient psychiatric office and begun on Zyprexa and Prozac and  has been on it ever since.  The patient denies symptoms of generalized anxiety disorder panic disorder or obsessive-compulsive disorder. His medical history includes hypercholesterolemia and hypertension.  Patient also believes that he has COPD. His past psychiatric history is characterized by never being in a psychiatric hospital seeing Dr. Laren Everts and receiving his psychiatric care from [facility]. Patient grew up in [place].  He has 2 brothers.  He finished high school with a little bit of college.  He never had any experiences of being abused as a child.  Patient is functioning fairly well at this time.  His financial status is fair.  Recently did have the death of his stepfather but is handling it well.  Associated Signs/Symptoms: Depression Symptoms:  fatigue, (Hypo) Manic Symptoms:   Anxiety Symptoms:   Psychotic Symptoms:   PTSD Symptoms:   Past Psychiatric History: Zyprexa and Prozac, has been psychotherapy in the past including cognitive behavioral therapy.  Previous Psychotropic Medications: Zyprexa and Prozac  Substance Abuse History in the last 12 months:    Consequences of Substance Abuse:   Past Medical History:  Past Medical History:  Diagnosis Date  . AMI (acute myocardial infarction) ([place])    Acute ST elevation  . Anxiety   . Bipolar disorder ([place])   . COPD (chronic obstructive pulmonary disease) ([place])   . Coronary artery disease   .  Hyperlipidemia   . Hypertension   . Nodule of left lung    6-mm smooothly rounded nodule  . Syncope and collapse    in 2001 and 2004 with no clear cause  . Tobacco abuse   . Viral URI with cough 11/22/2016    Past Surgical History:  Procedure Laterality Date  . stent placed in heart      Family Psychiatric History:   Family History:  Family History  Problem Relation Age of Onset  . Dementia Mother 42       died  . Alcohol abuse Father 91  . Depression Brother     Social History:   Social History   Socioeconomic  History  . Marital status: Married    Spouse name: Not on file  . Number of children: 1  . Years of education: Not on file  . Highest education level: Not on file  Occupational History  . Occupation: Retired-Sales/broker  Social Needs  . Financial resource strain: Not on file  . Food insecurity    Worry: Not on file    Inability: Not on file  . Transportation needs    Medical: Not on file    Non-medical: Not on file  Tobacco Use  . Smoking status: Current Every Day Smoker    Packs/day: 1.00    Years: 45.00    Pack years: 45.00    Types: Cigarettes  . Smokeless tobacco: Never Used  . Tobacco comment: 1 pack a day  Substance and Sexual Activity  . Alcohol use: Yes    Alcohol/week: 1.0 standard drinks    Types: 1 Standard drinks or equivalent per week    Comment: 2 glasses of wine daily  . Drug use: No    Comment: marijuana quit 2010. He has hx of THC use.  Marland Kitchen Sexual activity: Not on file  Lifestyle  . Physical activity    Days per week: Not on file    Minutes per session: Not on file  . Stress: Not on file  Relationships  . Social Herbalist on phone: Not on file    Gets together: Not on file    Attends religious service: Not on file    Active member of club or organization: Not on file    Attends meetings of clubs or organizations: Not on file    Relationship status: Not on file  Other Topics Concern  . Not on file  Social History Narrative   He is not working   He has been married for 35 years    One child (son)       He likes to [employer] - plays once a week     Additional Social History:   Allergies:   Allergies  Allergen Reactions  . Lisinopril Swelling    Metabolic Disorder Labs: Lab Results  Component Value Date   HGBA1C 6.1 08/25/2018   MPG 131 06/24/2009   No results found for: PROLACTIN Lab Results  Component Value Date   CHOL 195 08/25/2018   TRIG 141.0 08/25/2018   HDL 64.90 08/25/2018   CHOLHDL 3 08/25/2018   VLDL 28.2  08/25/2018   LDLCALC 102 (H) 08/25/2018   LDLCALC 89 01/10/2018   Lab Results  Component Value Date   TSH 2.88 08/25/2018    Therapeutic Level Labs: No results found for: LITHIUM No results found for: CBMZ No results found for: VALPROATE  Current Medications: Current Outpatient Medications  Medication Sig Dispense Refill  .  albuterol (PROAIR HFA) 108 (90 Base) MCG/ACT inhaler Inhale 2 puffs into the lungs every 6 (six) hours as needed. 1 Inhaler 3  . aspirin 81 MG tablet Take 81 mg by mouth daily.      Marland Kitchen atorvastatin (LIPITOR) 80 MG tablet Take 1 tablet (80 mg total) by mouth daily. Please make overdue appt with Dr. Angelena Form before anymore refills. 3rd attempt 90 tablet 1  . ezetimibe (ZETIA) 10 MG tablet Take 1 tablet (10 mg total) by mouth daily. 90 tablet 3  . FLUoxetine (PROZAC) 20 MG capsule Take 1 capsule (20 mg total) by mouth daily. 90 capsule 1  . meloxicam (MOBIC) 7.5 MG tablet Take 1 tablet (7.5 mg total) by mouth daily. 30 tablet 1  . metoprolol succinate (TOPROL-XL) 25 MG 24 hr tablet Take 1 tablet by mouth  Daily. Please make overdue appt with Dr. Angelena Form before anymore refills. 1st attempt 90 tablet 0  . Multiple Vitamin (MULTIVITAMIN) tablet Take 1 tablet by mouth daily.      . nitroGLYCERIN (NITROSTAT) 0.4 MG SL tablet Place 0.4 mg under the tongue every 5 (five) minutes as needed for chest pain.    Marland Kitchen OLANZapine (ZYPREXA) 5 MG tablet Take 1 tablet (5 mg total) by mouth at bedtime. 90 tablet 1   No current facility-administered medications for this visit.     Musculoskeletal: Strength & Muscle Tone: within normal limits Gait & Station: normal Patient leans: N/A  Psychiatric Specialty Exam: ROS  Blood pressure 128/74, pulse 65, temperature 98.5 F (36.9 C), height 5\' 6"  (1.676 m), weight 167 lb (75.8 kg), SpO2 95 %.Body mass index is 26.95 kg/m.  General Appearance: Casual  Eye Contact:  Good  Speech:  Clear and Coherent  Volume:  Normal  Mood:  Negative   Affect:  Appropriate  Thought Process:  Goal Directed  Orientation:  Full (Time, Place, and Person)  Thought Content:  WDL  Suicidal Thoughts:  No  Homicidal Thoughts:  No  Memory:  NA  Judgement:  Good  Insight:  NA and Good  Psychomotor Activity:  Normal  Concentration:    Recall:  Good  Fund of Knowledge:Good  Language: Good  Akathisia:  No  Handed:  Right  AIMS (if indicated):  not done  Assets:  Desire for Improvement  ADL's:  Intact  Cognition: WNL  Sleep:  Good   Screenings: PHQ2-9     Office Visit from 02/04/2015 in [place] at [place]  PHQ-2 Total Score  0      Assessment and Plan:  This patient's diagnosis is bipolar type I.  He presently is in remission.  He takes Zyprexa on a regular basis and demonstrates no evidence of tardive dyskinesia.  His hypercholesterolemia, hypertension is all stable on medications.  Patient takes Prozac 20 mg and is doing great.  At this time there is no reason to change these medicines.  Any consideration of changing him with surgery involved input from his wife.  I will ask his wife to come with him with his next visit in 3 months.  For now we will continue all these medications.  When his next visit we will go ahead and get some results from blood work and do a formal AIm scale.  He really is quite stable at this time.   Jerral Ralph, MD 6/19/202010:45 AM

## 2019-04-12 ENCOUNTER — Other Ambulatory Visit: Payer: Self-pay | Admitting: Cardiovascular Disease

## 2019-04-16 ENCOUNTER — Other Ambulatory Visit: Payer: Self-pay

## 2019-04-16 ENCOUNTER — Ambulatory Visit: Payer: Medicare Other | Admitting: Podiatry

## 2019-04-16 VITALS — Temp 98.8°F

## 2019-04-16 DIAGNOSIS — M79676 Pain in unspecified toe(s): Secondary | ICD-10-CM

## 2019-04-16 DIAGNOSIS — B351 Tinea unguium: Secondary | ICD-10-CM

## 2019-04-16 DIAGNOSIS — M65172 Other infective (teno)synovitis, left ankle and foot: Secondary | ICD-10-CM | POA: Diagnosis not present

## 2019-04-16 DIAGNOSIS — M659 Synovitis and tenosynovitis, unspecified: Secondary | ICD-10-CM

## 2019-04-18 NOTE — Progress Notes (Signed)
   SUBJECTIVE Patient presents to office today complaining of elongated, thickened nails that cause pain while ambulating in shoes. He is unable to trim his own nails.  He is also here for follow up evaluation of left ankle pain. He states he has been doing well but is going to the beach soon so he would like to get an injection prior. There are no modifying factors noted. Patient is here for further evaluation and treatment.   Past Medical History:  Diagnosis Date  . AMI (acute myocardial infarction) ([place])    Acute ST elevation  . Anxiety   . Bipolar disorder ([place])   . COPD (chronic obstructive pulmonary disease) ([place])   . Coronary artery disease   . Hyperlipidemia   . Hypertension   . Nodule of left lung    6-mm smooothly rounded nodule  . Syncope and collapse    in 2001 and 2004 with no clear cause  . Tobacco abuse   . Viral URI with cough 11/22/2016    OBJECTIVE General Patient is awake, alert, and oriented x 3 and in no acute distress. Derm Skin is dry and supple bilateral. Negative open lesions or macerations. Remaining integument unremarkable. Nails are tender, long, thickened and dystrophic with subungual debris, consistent with onychomycosis, 1-5 bilateral. No signs of infection noted. Vasc  DP and PT pedal pulses palpable bilaterally. Temperature gradient within normal limits.  Neuro Epicritic and protective threshold sensation grossly intact bilaterally.  Musculoskeletal Exam Pain with palpation noted to the anterior, lateral and medial aspects of the left ankle. No symptomatic pedal deformities noted bilateral. Muscular strength within normal limits.  ASSESSMENT 1. Onychodystrophic nails 1-5 bilateral with hyperkeratosis of nails.  2. Onychomycosis of nail due to dermatophyte bilateral 3. Left ankle DJD/synovitis   PLAN OF CARE 1. Patient evaluated today.  2. Instructed to maintain good pedal hygiene and foot care.  3. Mechanical debridement of nails 1-5 bilaterally  performed using a nail nipper. Filed with dremel without incident.  4. Injection of 0.5 mLs Celestone Soluspan injected into the left ankle joint.  5. Return to clinic in 3 mos.    Edrick Kins, DPM [facility]  Dr. Edrick Kins, [address]                                        [place], [place] [address]                Office [phone]  Fax [phone]

## 2019-05-22 ENCOUNTER — Other Ambulatory Visit: Payer: Self-pay | Admitting: Cardiovascular Disease

## 2019-05-22 MED ORDER — EZETIMIBE 10 MG PO TABS: 10.0000 mg | ORAL_TABLET | Freq: Every day | ORAL | 0 refills | Status: DC

## 2019-05-25 DIAGNOSIS — H401131 Primary open-angle glaucoma, bilateral, mild stage: Secondary | ICD-10-CM | POA: Diagnosis not present

## 2019-05-31 ENCOUNTER — Other Ambulatory Visit: Payer: Self-pay | Admitting: Cardiovascular Disease

## 2019-06-04 ENCOUNTER — Other Ambulatory Visit: Payer: Self-pay | Admitting: Cardiovascular Disease

## 2019-06-05 NOTE — Telephone Encounter (Signed)
Please call to schedule overdue F/U. Thank you!

## 2019-06-08 DIAGNOSIS — H401131 Primary open-angle glaucoma, bilateral, mild stage: Secondary | ICD-10-CM | POA: Diagnosis not present

## 2019-06-11 ENCOUNTER — Telehealth: Payer: Self-pay | Admitting: Cardiovascular Disease

## 2019-06-11 ENCOUNTER — Other Ambulatory Visit: Payer: Self-pay

## 2019-06-11 MED ORDER — EZETIMIBE 10 MG PO TABS: 10.0000 mg | ORAL_TABLET | Freq: Every day | ORAL | 0 refills | Status: DC

## 2019-06-11 NOTE — Telephone Encounter (Signed)
New Message   *STAT* If patient is at the pharmacy, call can be transferred to refill team.   1. Which medications need to be refilled? (please list name of each medication and dose if known)  ezetimibe (ZETIA) 10 MG tablet  2. Which pharmacy/location (including street and city if local pharmacy) is medication to be sent to? [facility] - [place], [place] - [address]  3. Do they need a 30 day or 90 day supply? 90 day  

## 2019-06-11 NOTE — Telephone Encounter (Signed)
Called pt to inform him that his medication was sent to his requested pharmacy today and if he has any other problems, questions or concerns, to call the office. Pt verbalized understanding.

## 2019-06-13 ENCOUNTER — Other Ambulatory Visit: Payer: Self-pay | Admitting: Cardiovascular Disease

## 2019-06-15 ENCOUNTER — Encounter: Payer: Self-pay | Admitting: *Deleted

## 2019-06-17 ENCOUNTER — Encounter: Payer: Self-pay | Admitting: Physician Assistant

## 2019-06-17 NOTE — Progress Notes (Addendum)
Virtual Visit via Telephone Note   This visit type was conducted due to national recommendations for restrictions regarding the COVID-19 Pandemic (e.g. social distancing) in an effort to limit this patient's exposure and mitigate transmission in our community.  Due to his co-morbid illnesses, this patient is at least at moderate risk for complications without adequate follow up.  This format is felt to be most appropriate for this patient at this time.  The patient did not have access to video technology/had technical difficulties with video requiring transitioning to audio format only (telephone).  All issues noted in this document were discussed and addressed.  No physical exam could be performed with this format.  Please refer to the patient's chart for his  consent to telehealth for [facility].   Date:  06/18/2019   ID:  Jeffrey Hudson, DOB 06/15/1951, MRN [id number]  Patient Location: Home Provider Location: Home  PCP:  Dorothyann Peng, NP  Cardiologist:  Lauree Chandler, MD  Electrophysiologist:  None   Evaluation Performed:  Follow-Up Visit  Chief Complaint:  1 yr f/u CAD  History of Present Illness:    Jeffrey Hudson is a 68 y.o. male with CAD s/p anterolateral STEMI with BMS placed in large diagonal branch (2009), COPD, tobacco use, mild HTN, HLD, bipolar disorder, pre-DM who is seen virtually for annual follow-up in setting of Covid-19 pandemic. Last cath was in 01/2010 showing patent stent with 40-50% restenosis, 20% LAD in several llocations, 50% Cx, 30% distal RCA, and 60% focal PLA, COPD, tobacco use, HLD, bipolar disorder. Last myoview stress test was in 12/2014 without reversible ischemia, EF 64%. Last labs 08/2018 showed A1C 6.1, normal TSH, Hgb 14.5, K 4.4, Cr 0.94, normal LFTs, LDL 102. He was referred to lipid clinic in 2019 with addition of Zetia and planned f/u but do not see the f/u occurred. He is followed by low dose lung cancer screening protocol, CT also  incidentally showed fatty liver.  He reports he is doing very well from a cardiac standpoint without any CP, new SOB, palpitations, dizziness, pre-syncope or syncope. Has chronic DOE related to COPD, denies any interim change to his overall cardiac status. He is tolerating all meds well. He notes his BP is high today on home machine but unclear if accurate since subsequent readings have been 150-160 range. He is usually 120s-130s in Epic previously. He does recall being see at the eye doctor recently for glaucoma and BP was 150/80. Denies any lifestyle changes recently. The patient does not have symptoms concerning for COVID-19 infection (fever, chills, cough, or new shortness of breath). He is taking extreme caution during pandemic given his comorbidities.   Past Medical History:  Diagnosis Date  . AMI (acute myocardial infarction) ([place])    Acute ST elevation  . Anxiety   . Bipolar disorder ([place])   . COPD (chronic obstructive pulmonary disease) ([place])   . Coronary artery disease    a. s/p anterolateral STEMI with BMS placed in large diagonal branch (2009).  Marland Kitchen Hyperlipidemia   . Hypertension   . Nodule of left lung    6-mm smooothly rounded nodule  . Pre-diabetes   . Syncope and collapse    in 2001 and 2004 with no clear cause  . Tobacco abuse   . Viral URI with cough 11/22/2016   Past Surgical History:  Procedure Laterality Date  . stent placed in heart       Current Meds  Medication Sig  . albuterol (PROAIR  HFA) 108 (90 Base) MCG/ACT inhaler Inhale 2 puffs into the lungs every 6 (six) hours as needed.  Marland Kitchen aspirin 81 MG tablet Take 81 mg by mouth daily.    Marland Kitchen atorvastatin (LIPITOR) 80 MG tablet Take 1 tablet (80 mg total) by mouth daily at 6 PM.  . ezetimibe (ZETIA) 10 MG tablet Take 1 tablet (10 mg total) by mouth daily.  Marland Kitchen FLUoxetine (PROZAC) 20 MG capsule Take 1 capsule (20 mg total) by mouth daily.  . meloxicam (MOBIC) 7.5 MG tablet Take 7.5 mg by mouth as needed for pain.  .  metoprolol succinate (TOPROL-XL) 25 MG 24 hr tablet TAKE 1 TABLET BY MOUTH  DAILY.  . Multiple Vitamin (MULTIVITAMIN) tablet Take 1 tablet by mouth daily.    . nitroGLYCERIN (NITROSTAT) 0.4 MG SL tablet Place 0.4 mg under the tongue every 5 (five) minutes as needed for chest pain.  Marland Kitchen OLANZapine (ZYPREXA) 5 MG tablet Take 1 tablet (5 mg total) by mouth at bedtime.  . timolol (BETIMOL) 0.25 % ophthalmic solution Place 1-2 drops into both eyes daily.  . travoprost, benzalkonium, (TRAVATAN) 0.004 % ophthalmic solution Place 1 drop into both eyes at bedtime.  . [DISCONTINUED] atorvastatin (LIPITOR) 80 MG tablet Take 1 tablet (80 mg total) by mouth daily at 6 PM. Please keep upcoming appt in August before anymore refills. Thank you  . [DISCONTINUED] ezetimibe (ZETIA) 10 MG tablet Take 1 tablet (10 mg total) by mouth daily.  . [DISCONTINUED] metoprolol succinate (TOPROL-XL) 25 MG 24 hr tablet TAKE 1 TABLET BY MOUTH  DAILY. PLEASE SCHEDULE AN APPOINTMENT     Allergies:   Lisinopril   Social History   Tobacco Use  . Smoking status: Current Every Day Smoker    Packs/day: 1.00    Years: 45.00    Pack years: 45.00    Types: Cigarettes  . Smokeless tobacco: Never Used  . Tobacco comment: 1 pack a day  Substance Use Topics  . Alcohol use: Yes    Alcohol/week: 1.0 standard drinks    Types: 1 Standard drinks or equivalent per week    Comment: 2 glasses of wine daily  . Drug use: No    Comment: marijuana quit 2010. He has hx of THC use.     Family Hx: The patient's family history includes Alcohol abuse (age of onset: 48) in his father; Dementia (age of onset: 39) in his mother; Depression in his brother.  ROS:   Please see the history of present illness.    All other systems reviewed and are negative.   Prior CV studies:    Most recent pertinent cardiac studies are outlined above.  Labs/Other Tests and Data Reviewed:    EKG:  An ECG dated 01/10/18 was personally reviewed today and  demonstrated:  sinus bradycardia 58bpm otherwise normal  Recent Labs: 08/25/2018: ALT 30; BUN 22; Creatinine, Ser 0.94; Hemoglobin 14.5; Platelets 211.0; Potassium 4.4; Sodium 140; TSH 2.88   Recent Lipid Panel Lab Results  Component Value Date/Time   CHOL 195 08/25/2018 07:51 AM   CHOL 173 01/10/2018 09:27 AM   TRIG 141.0 08/25/2018 07:51 AM   HDL 64.90 08/25/2018 07:51 AM   HDL 64 01/10/2018 09:27 AM   CHOLHDL 3 08/25/2018 07:51 AM   LDLCALC 102 (H) 08/25/2018 07:51 AM   LDLCALC 89 01/10/2018 09:27 AM    Wt Readings from Last 3 Encounters:  06/18/19 165 lb (74.8 kg)  04/06/19 167 lb (75.8 kg)  12/27/18 166 lb 6.4 oz (75.5  kg)     Objective:    Vital Signs:  BP (!) 171/109   Pulse (!) 58   Temp (!) 97.5 F (36.4 C)   Ht 5\' 6"  (1.676 m)   Wt 165 lb (74.8 kg)   BMI 26.63 kg/m    VS reviewed. General - pleasant calm WM in no acute distress Pulm - No labored breathing, no coughing during visit, no audible wheezing, speaking in full sentences Neuro - A+Ox3, no slurred speech, answers questions appropriately Psych - Pleasant affect    ASSESSMENT & PLAN:    1. CAD - clinically doing well without angina. Continue risk factor modification as outlined below. Update CBC with labs given daily ASA use since we are getting labs as below with [facility] and obtain AliveCor to ensure NSR. 2. Hyperlipidemia - last LDL uncontrolled in 08/2018, seems to have fallen off the radar with lipid clinic. Offered [facility] option to get labs and BP. Will have them get a CMET and lipid profile. If LDL remains >70, will need to consider PCSK9. 3. Tobacco abuse - counseled about cessation. He has gradually cut down from heavier use. 4. Essential HTN - elevated. He is unsure of accuracy of cuff. Will arrange [facility] team to go out and verify calibration of cuff. In the meantime he will keep a log of daily BPs. If this remains above goal of >130/80, will plan to add amlodipine. Cannot  titrate BB further due to bradycardia and he has h/o allergy to ACEi. Check thyroid with labs.  COVID-19 Education: The signs and symptoms of COVID-19 were discussed with the patient and how to seek care for testing (follow up with PCP or arrange E-visit).  The importance of social distancing was discussed today.  Time:   Today, I have spent 20 minutes with the patient with telehealth technology discussing the above problems.     Medication Adjustments/Labs and Tests Ordered: Current medicines are reviewed at length with the patient today.  Concerns regarding medicines are outlined above.   Disposition:  Follow up in 6 months in office (since last physical OV was 12/2017).  Signed, Charlie Pitter, PA-C  06/18/2019 9:06 AM    [place]

## 2019-06-18 ENCOUNTER — Telehealth: Payer: Self-pay | Admitting: *Deleted

## 2019-06-18 ENCOUNTER — Encounter: Payer: Self-pay | Admitting: Physician Assistant

## 2019-06-18 ENCOUNTER — Other Ambulatory Visit: Payer: Self-pay

## 2019-06-18 ENCOUNTER — Telehealth (INDEPENDENT_AMBULATORY_CARE_PROVIDER_SITE_OTHER): Payer: Medicare Other | Admitting: Physician Assistant

## 2019-06-18 VITALS — BP 171/109 | HR 58 | Temp 97.5°F | Ht 66.0 in | Wt 165.0 lb

## 2019-06-18 DIAGNOSIS — I10 Essential (primary) hypertension: Secondary | ICD-10-CM

## 2019-06-18 DIAGNOSIS — Z720 Tobacco use: Secondary | ICD-10-CM

## 2019-06-18 DIAGNOSIS — E785 Hyperlipidemia, unspecified: Secondary | ICD-10-CM

## 2019-06-18 DIAGNOSIS — I2510 Atherosclerotic heart disease of native coronary artery without angina pectoris: Secondary | ICD-10-CM | POA: Diagnosis not present

## 2019-06-18 MED ORDER — ATORVASTATIN CALCIUM 80 MG PO TABS: 80.0000 mg | ORAL_TABLET | Freq: Every day | ORAL | 3 refills | Status: DC

## 2019-06-18 MED ORDER — EZETIMIBE 10 MG PO TABS: 10.0000 mg | ORAL_TABLET | Freq: Every day | ORAL | 3 refills | Status: DC

## 2019-06-18 MED ORDER — METOPROLOL SUCCINATE ER 25 MG PO TB24: ORAL_TABLET | ORAL | 3 refills | Status: DC

## 2019-06-18 NOTE — Telephone Encounter (Signed)
[facility] Visit Initial Request  Date of Request ([place]):  June 18, 2019  Requesting Provider:  Melina Copa, PA-C    Agency Requested:    [facility] Contact:  Glory Buff, NP [address] [place], [place] [address] Phone #:  [phone] Fax #:  [phone]  Patient Demographic Information: Name:  Jeffrey Hudson Age:  68 y.o.   DOB:  1951-02-25  MRN:  [id number]   Address:   [address] [place] [address]   Phone Numbers:   Home Phone [phone]  Mobile [phone]     Emergency Contact Information on File:   Contact Information    Name Relation Home Work Shickley Spouse   [phone]      The above family members may be contacted for information on this patient (review DPR on file):  No    Patient Clinical Information:  Primary Care Provider:  Dorothyann Peng, NP  Primary Cardiologist:  Lauree Chandler, MD  Primary Electrophysiologist:  None   Past Medical Hx: Mr. Naftzger  has a past medical history of AMI (acute myocardial infarction) ([place]), Anxiety, Bipolar disorder ([place]), COPD (chronic obstructive pulmonary disease) ([place]), Coronary artery disease, Hyperlipidemia, Hypertension, Nodule of left lung, Pre-diabetes, Syncope and collapse, Tobacco abuse, and Viral URI with cough (11/22/2016).   Allergies: He is allergic to lisinopril.   Medications: Current Outpatient Medications on File Prior to Visit  Medication Sig  . albuterol (PROAIR HFA) 108 (90 Base) MCG/ACT inhaler Inhale 2 puffs into the lungs every 6 (six) hours as needed.  Marland Kitchen aspirin 81 MG tablet Take 81 mg by mouth daily.    Marland Kitchen atorvastatin (LIPITOR) 80 MG tablet Take 1 tablet (80 mg total) by mouth daily at 6 PM.  . ezetimibe (ZETIA) 10 MG tablet Take 1 tablet (10 mg total) by mouth daily.  Marland Kitchen FLUoxetine (PROZAC) 20 MG capsule Take 1 capsule (20 mg total) by mouth daily.  . meloxicam (MOBIC) 7.5 MG tablet Take 7.5 mg by  mouth as needed for pain.  . metoprolol succinate (TOPROL-XL) 25 MG 24 hr tablet TAKE 1 TABLET BY MOUTH  DAILY.  . Multiple Vitamin (MULTIVITAMIN) tablet Take 1 tablet by mouth daily.    . nitroGLYCERIN (NITROSTAT) 0.4 MG SL tablet Place 0.4 mg under the tongue every 5 (five) minutes as needed for chest pain.  Marland Kitchen OLANZapine (ZYPREXA) 5 MG tablet Take 1 tablet (5 mg total) by mouth at bedtime.  . timolol (BETIMOL) 0.25 % ophthalmic solution Place 1-2 drops into both eyes daily.  . travoprost, benzalkonium, (TRAVATAN) 0.004 % ophthalmic solution Place 1 drop into both eyes at bedtime.   No current facility-administered medications on file prior to visit.      Social Hx: He  reports that he has been smoking cigarettes. He has a 45.00 pack-year smoking history. He has never used smokeless tobacco. He reports current alcohol use of about 1.0 standard drinks of alcohol per week. He reports that he does not use drugs.    Diagnosis/Reason for Visit:   INITIAL REQUEST  Services Requested:  Vital Signs (BP, Pulse, O2, Weight)  Physical Exam  Rhythm Strip (AliveCor Device)  Labs:  CBC, CMET, TSH, LIPID  # of Visits Needed/Frequency per Week: 1 VISIT AT FIRST AVAILABLE

## 2019-06-18 NOTE — Patient Instructions (Addendum)
Medication Instructions:  Your physician recommends that you continue on your current medications as directed. Please refer to the Current Medication list given to you today. We have sent in a 1 year supply to your pharmacy for the Metoprolol, Atorvastatin, & Zetia  If you need a refill on your cardiac medications before your next appointment, please call your pharmacy.   Lab work: [facility] WILL CONTACT YOU TO COME OUT AND DRAW LAB WORK  If you have labs (blood work) drawn today and your tests are completely normal, you will receive your results only by: Marland Kitchen MyChart Message (if you have MyChart) OR . A paper copy in the mail If you have any lab test that is abnormal or we need to change your treatment, we will call you to review the results.  Testing/Procedures: None ordered  Follow-Up: At [facility], you and your health needs are our priority.  As part of our continuing mission to provide you with exceptional heart care, we have created designated Provider Care Teams.  These Care Teams include your primary Cardiologist (physician) and Advanced Practice Providers (APPs -  Physician Assistants and Nurse Practitioners) who all work together to provide you with the care you need, when you need it. You will need a follow up appointment in 6 months.  Please call our office 2 months in advance to schedule this appointment.  You may see Lauree Chandler, MD or one of the following Advanced Practice Providers on your designated Care Team:   [place], PA-C Melina Copa, PA-C . Ermalinda Barrios, PA-C  Any Other Special Instructions Will Be Listed Below (If Applicable).

## 2019-06-20 DIAGNOSIS — I2510 Atherosclerotic heart disease of native coronary artery without angina pectoris: Secondary | ICD-10-CM | POA: Diagnosis not present

## 2019-06-20 DIAGNOSIS — I10 Essential (primary) hypertension: Secondary | ICD-10-CM | POA: Diagnosis not present

## 2019-06-20 DIAGNOSIS — E785 Hyperlipidemia, unspecified: Secondary | ICD-10-CM | POA: Diagnosis not present

## 2019-06-20 NOTE — Progress Notes (Signed)
Jeffrey Hudson       DOB: [birth year]  Purpose of Visit: Labs, VS, weight, Exam, LiveCor,  Ordering provider: Melina Copa, PA-C  Medications: Is the patient taking all medications listed on MAR from Epic? Yes, but has since been using OTC nicotine patches & states they are helping.  Would like to quit smoking completely.    List any medications that are not being taken correctly: n/a  List any medication refills needed: n/a  Is the patient able to pick up medications? Yes  Vitals: BP:  164/80   HR: 55   Oxygen: 98%RA Weight: 165lbs        Physical Exam:  Lung sounds: clear  Heart sounds: regular.  Rate 59 via AliveCor  Peripheral edema: no  Wounds: no  Location:  Any patient concerns? He expressed wanting to quit smoking entirely and says the OTC nicotine patches from [employer] have helping.  Tries to walk several times/wk in neighborhood but difficult d/t the heat and sob w/exertion.  His home is at the bottom [place] cul-de[place]sac and he gets sob when walking up it.  He says he walks to stop sign and back.  Approx 1/4 mile total.   ReDS Vest/Clip Reading: n/a  Rhythm Strip:  55 SB resulted via AliveCor device   Is Home Health recommended? No If yes, state reason:     TSH, fasting lipid panel, CBC, CMP drawn and [facility] on [address] In [place].    Vanita Ingles, RN 06/20/19

## 2019-06-21 ENCOUNTER — Telehealth: Payer: Self-pay | Admitting: Cardiovascular Disease

## 2019-06-21 LAB — COMPREHENSIVE METABOLIC PANEL
ALT: 44 IU/L (ref 0–44)
AST: 44 IU/L — ABNORMAL HIGH (ref 0–40)
Albumin/Globulin Ratio: 2.5 — ABNORMAL HIGH (ref 1.2–2.2)
Albumin: 4 g/dL (ref 3.8–4.8)
Alkaline Phosphatase: 100 IU/L (ref 39–117)
BUN/Creatinine Ratio: 28 — ABNORMAL HIGH (ref 10–24)
BUN: 22 mg/dL (ref 8–27)
Bilirubin Total: 1 mg/dL (ref 0.0–1.2)
CO2: 21 mmol/L (ref 20–29)
Calcium: 9.3 mg/dL (ref 8.6–10.2)
Chloride: 105 mmol/L (ref 96–106)
Creatinine, Ser: 0.8 mg/dL (ref 0.76–1.27)
GFR calc Af Amer: 105 mL/min/{1.73_m2} (ref >59)
GFR calc non Af Amer: 91 mL/min/{1.73_m2} (ref >59)
Globulin, Total: 1.6 g/dL (ref 1.5–4.5)
Glucose: 93 mg/dL (ref 65–99)
Potassium: 4.4 mmol/L (ref 3.5–5.2)
Sodium: 140 mmol/L (ref 134–144)
Total Protein: 5.6 g/dL — ABNORMAL LOW (ref 6.0–8.5)

## 2019-06-21 LAB — CBC
Hematocrit: 38.9 % (ref 37.5–51.0)
Hemoglobin: 14 g/dL (ref 13.0–17.7)
MCH: 33.3 pg — ABNORMAL HIGH (ref 26.6–33.0)
MCHC: 36 g/dL — ABNORMAL HIGH (ref 31.5–35.7)
MCV: 93 fL (ref 79–97)
Platelets: 221 10*3/uL (ref 150–450)
RBC: 4.2 x10E6/uL (ref 4.14–5.80)
RDW: 12.9 % (ref 11.6–15.4)
WBC: 8.1 10*3/uL (ref 3.4–10.8)

## 2019-06-21 LAB — LIPID PANEL
Chol/HDL Ratio: 2.3 ratio (ref 0.0–5.0)
Cholesterol, Total: 149 mg/dL (ref 100–199)
HDL: 66 mg/dL (ref >39)
LDL Chol Calc (NIH): 66 mg/dL (ref 0–99)
Triglycerides: 93 mg/dL (ref 0–149)
VLDL Cholesterol Cal: 17 mg/dL (ref 5–40)

## 2019-06-21 LAB — TSH: TSH: 1.67 u[IU]/mL (ref 0.450–4.500)

## 2019-06-21 MED ORDER — AMLODIPINE BESYLATE 5 MG PO TABS: 5.0000 mg | ORAL_TABLET | Freq: Every day | ORAL | 3 refills | Status: DC

## 2019-06-21 NOTE — Telephone Encounter (Signed)
Notes recorded by Frederik Schmidt, RN on 06/21/2019 at 2:35 PM EDT  Lpm 9/3  ------

## 2019-06-21 NOTE — Telephone Encounter (Signed)
-----   Message from Charlie Pitter, [place] sent at 06/21/2019  2:31 PM EDT ----- I see [place]leigh spoke to patient earlier today about BP, agree with plan. He can also send [place] a Mychart message with his BPs if he uses that system. Please let him know overall labs looked good.  Thyroid normal. Cholesterol actually looks great compared to the last time it was checked, so no changes needed at this time. One of his liver function numbers is mildly elevated at 44 and protein level is down a little bit. His liver function has been minimally abnormal in the past. Given lack of remarkable change, no acute management is needed but would advise he f/u primary care for liver monitoring. The protein level could reflect need for better nutrition but may need to be followed up by primary care to make sure he's not losing any protein in his urine. Blood count looks good.  Dayna Dunn PA-C

## 2019-06-21 NOTE — Telephone Encounter (Signed)
New message    Pt c/o BP issue: STAT if pt c/o blurred vision, one-sided weakness or slurred speech  1. What are your last 5 BP readings? 190/100 133/84   2. Are you having any other symptoms (ex. Dizziness, headache, blurred vision, passed out)? no  3. What is your BP issue? Patient states that his b/p is elevated

## 2019-06-21 NOTE — Telephone Encounter (Signed)
Pt calling in with a BP of 190/100 about 1 hr after taking his Metoprolol Recent visit on 8/31 with Dayna Dunn BP was 171/109 Home health visit on 9/2 with a BP of 164/80  Pt reports no symptoms and "feels fine"  After speaking with Dr. Angelena Form, we will try adding Amlodipine 5mg  daily. Rx sent to [facility]   Pt verbalized understanding. Advised him to continue taking his BP and HR and call us back on Monday with the readings. He is aware of the signs and symptoms that would be concerning, he is aware to call us back if he has any other issues.

## 2019-06-21 NOTE — Telephone Encounter (Signed)
Thank you for the update!

## 2019-06-22 ENCOUNTER — Ambulatory Visit: Payer: Medicare Other | Admitting: Adult Health

## 2019-06-22 ENCOUNTER — Encounter: Payer: Self-pay | Admitting: Adult Health

## 2019-06-22 ENCOUNTER — Other Ambulatory Visit: Payer: Self-pay

## 2019-06-22 ENCOUNTER — Ambulatory Visit (INDEPENDENT_AMBULATORY_CARE_PROVIDER_SITE_OTHER): Payer: Medicare Other

## 2019-06-22 VITALS — BP 124/66 | HR 66 | Temp 98.4°F | Ht 66.0 in | Wt 159.6 lb

## 2019-06-22 DIAGNOSIS — R0602 Shortness of breath: Secondary | ICD-10-CM

## 2019-06-22 DIAGNOSIS — J432 Centrilobular emphysema: Secondary | ICD-10-CM

## 2019-06-22 DIAGNOSIS — R918 Other nonspecific abnormal finding of lung field: Secondary | ICD-10-CM | POA: Diagnosis not present

## 2019-06-22 LAB — BRAIN NATRIURETIC PEPTIDE: Pro B Natriuretic peptide (BNP): 48 pg/mL (ref 0.0–100.0)

## 2019-06-22 MED ORDER — BEVESPI AEROSPHERE 9-4.8 MCG/ACT IN AERO: 2.0000 | INHALATION_SPRAY | Freq: Two times a day (BID) | RESPIRATORY_TRACT | 0 refills | Status: DC

## 2019-06-22 MED ORDER — BEVESPI AEROSPHERE 9-4.8 MCG/ACT IN AERO: 2.0000 | INHALATION_SPRAY | Freq: Two times a day (BID) | RESPIRATORY_TRACT | 5 refills | Status: DC

## 2019-06-22 NOTE — Telephone Encounter (Signed)
Notes recorded by Frederik Schmidt, RN on 06/22/2019 at 9:47 AM EDT  lpmtcb 9/4  ------

## 2019-06-22 NOTE — Progress Notes (Signed)
Patient seen in the office today and instructed on use of Bevespi.  Patient expressed understanding and demonstrated technique. Parke Poisson, [facility] 06/22/19

## 2019-06-22 NOTE — Telephone Encounter (Signed)
-----   Message from Charlie Pitter, [place] sent at 06/21/2019  2:31 PM EDT ----- I see [place]leigh spoke to patient earlier today about BP, agree with plan. He can also send [place] a Mychart message with his BPs if he uses that system. Please let him know overall labs looked good.  Thyroid normal. Cholesterol actually looks great compared to the last time it was checked, so no changes needed at this time. One of his liver function numbers is mildly elevated at 44 and protein level is down a little bit. His liver function has been minimally abnormal in the past. Given lack of remarkable change, no acute management is needed but would advise he f/u primary care for liver monitoring. The protein level could reflect need for better nutrition but may need to be followed up by primary care to make sure he's not losing any protein in his urine. Blood count looks good.  Dayna Dunn PA-C

## 2019-06-22 NOTE — Assessment & Plan Note (Addendum)
Suspect progressive COPD in active smoker  Check bnp , if elevated consider 2 D echo  Check PFT on return   Plan  Patient Instructions  Begin Bevespi 2 puffs twice daily Work on not smoking Follow-up in 6 weeks with Dr. Elsworth Soho or Nicolaos Mitrano NP for PFT  Please contact office for sooner follow up if symptoms do not improve or worsen or seek emergency care

## 2019-06-22 NOTE — Patient Instructions (Signed)
Begin Bevespi 2 puffs twice daily Work on not smoking Follow-up in 6 weeks with Dr. Elsworth Soho or Parrett NP for PFT  Please contact office for sooner follow up if symptoms do not improve or worsen or seek emergency care

## 2019-06-22 NOTE — Assessment & Plan Note (Signed)
LDCT chest planned for 07/2019

## 2019-06-22 NOTE — Progress Notes (Signed)
@Patient  ID: Jeffrey Hudson, male    DOB: [birth year], 68 y.o.   MRN: [id number]  Chief Complaint  Patient presents with  . Follow-up    COPD     Referring provider: Dorothyann Peng, NP  HPI: 68 year old male active smoker seen for pulmonary consult December 27, 2018 for COPD and emphysema Participates in the LDCT lung cancer screening program  TEST/EVENTS :  PFTs June 2013 FEV1 99%, ratio 63, FVC 112%, DLCO 71% Spirometry December 27, 2018 FEV1 61%, ratio 71, FVC 63%  LDCT chest October 2019 benign appearance  06/22/2019 follow-up COPD/emphysema Patient returns for a follow-up.  Patient says over the last few months has noticed his breathing has not been doing as well.  Gets more short of breath with certain activities than usual.  Shortness of breath seems to be worse at night.  He denies any increased cough or wheezing.  Says he is somewhat active tries to walk about 15 to 20 minutes each day.  Says he can go up the incline without any significant shortness of breath.  Walk test in the office today showed no desaturations with O2 saturations 96% on room air.  He denies any chest pain.  No edema.  Positive orthopnea intermittently.  No calf pain or hemoptysis.  No weight loss or increased weight gain.  Has albuterol inhaler to use as needed but says does not seem to make a difference in his breathing.  Chest x-ray today showed clear lungs without acute process   Social history   Retired  Pensions consultant [employer]  Smoke  Pet-cat No birds/chickens/hottub /basement  No travel  [place] from [place].     Allergies  Allergen Reactions  . Lisinopril Swelling    Immunization History  Administered Date(s) Administered  . Influenza, High Dose Seasonal PF 08/06/2016, 06/29/2018, 06/21/2019  . Influenza,inj,Quad PF,6+ Mos 08/03/2013  . Influenza-Unspecified 11/03/2014, 09/07/2017  . Pneumococcal Conjugate-13 02/04/2015  . Pneumococcal Polysaccharide-23 08/06/2016  . Td 02/04/2015  . Zoster  04/04/2015    Past Medical History:  Diagnosis Date  . AMI (acute myocardial infarction) ([place])    Acute ST elevation  . Anxiety   . Bipolar disorder ([place])   . COPD (chronic obstructive pulmonary disease) ([place])   . Coronary artery disease    a. s/p anterolateral STEMI with BMS placed in large diagonal branch (2009).  Marland Kitchen Hyperlipidemia   . Hypertension   . Nodule of left lung    6-mm smooothly rounded nodule  . Pre-diabetes   . Syncope and collapse    in 2001 and 2004 with no clear cause  . Tobacco abuse   . Viral URI with cough 11/22/2016    Tobacco History: Social History   Tobacco Use  Smoking Status Current Every Day Smoker  . Packs/day: 1.00  . Years: 45.00  . Pack years: 45.00  . Types: Cigarettes  Smokeless Tobacco Never Used  Tobacco Comment   1 pack a day   Ready to quit: No Counseling given: Yes Comment: 1 pack a day   Outpatient Medications Prior to Visit  Medication Sig Dispense Refill  . albuterol (PROAIR HFA) 108 (90 Base) MCG/ACT inhaler Inhale 2 puffs into the lungs every 6 (six) hours as needed. 1 Inhaler 3  . amLODipine (NORVASC) 5 MG tablet Take 1 tablet (5 mg total) by mouth daily. 180 tablet 3  . aspirin 81 MG tablet Take 81 mg by mouth daily.      Marland Kitchen atorvastatin (LIPITOR) 80 MG tablet Take  1 tablet (80 mg total) by mouth daily at 6 PM. 90 tablet 3  . ezetimibe (ZETIA) 10 MG tablet Take 1 tablet (10 mg total) by mouth daily. 90 tablet 3  . FLUoxetine (PROZAC) 20 MG capsule Take 1 capsule (20 mg total) by mouth daily. 90 capsule 1  . meloxicam (MOBIC) 7.5 MG tablet Take 7.5 mg by mouth as needed for pain.    . metoprolol succinate (TOPROL-XL) 25 MG 24 hr tablet TAKE 1 TABLET BY MOUTH  DAILY. 90 tablet 3  . Multiple Vitamin (MULTIVITAMIN) tablet Take 1 tablet by mouth daily.      . nitroGLYCERIN (NITROSTAT) 0.4 MG SL tablet Place 0.4 mg under the tongue every 5 (five) minutes as needed for chest pain.    Marland Kitchen OLANZapine (ZYPREXA) 5 MG tablet Take 1  tablet (5 mg total) by mouth at bedtime. 90 tablet 1  . timolol (BETIMOL) 0.25 % ophthalmic solution Place 1-2 drops into both eyes daily.    . travoprost, benzalkonium, (TRAVATAN) 0.004 % ophthalmic solution Place 1 drop into both eyes at bedtime.     No facility-administered medications prior to visit.      Review of Systems:   Constitutional:   No  weight loss, night sweats,  Fevers, chills, fatigue, or  lassitude.  HEENT:   No headaches,  Difficulty swallowing,  Tooth/dental problems, or  Sore throat,                No sneezing, itching, ear ache, nasal congestion, post nasal drip,   CV:  No chest pain,  Orthopnea, PND, swelling in lower extremities, anasarca, dizziness, palpitations, syncope.   GI  No heartburn, indigestion, abdominal pain, nausea, vomiting, diarrhea, change in bowel habits, loss of appetite, bloody stools.   Resp: No excess mucus, no productive cough,  No non-productive cough,  No coughing up of blood.  No change in color of mucus.  No wheezing.  No chest wall deformity  Skin: no rash or lesions.  GU: no dysuria, change in color of urine, no urgency or frequency.  No flank pain, no hematuria   MS:  No joint pain or swelling.  No decreased range of motion.  No back pain.    Physical Exam  BP 124/66 (BP Location: Left Arm, Cuff Size: Normal)   Pulse 66   Temp 98.4 F (36.9 C) (Temporal)   Ht 5\' 6"  (1.676 m)   Wt 159 lb 9.6 oz (72.4 kg)   SpO2 96%   BMI 25.76 kg/m   GEN: A/Ox3; pleasant , NAD, well nourished    HEENT:  [place]/AT,   NOSE-clear, THROAT-clear, no lesions, no postnasal drip or exudate noted.   NECK:  Supple w/ fair ROM; no JVD; normal carotid impulses w/o bruits; no thyromegaly or nodules palpated; no lymphadenopathy.    RESP  Clear  P & A; w/o, wheezes/ rales/ or rhonchi. no accessory muscle use, no dullness to percussion  CARD:  RRR, no m/r/g, no peripheral edema, pulses intact, no cyanosis or clubbing.  GI:   Soft & nt; nml bowel  sounds; no organomegaly or masses detected.   Musco: Warm bil, no deformities or joint swelling noted.   Neuro: alert, no focal deficits noted.    Skin: Warm, no lesions or rashes    Lab Results:  CBC    Component Value Date/Time   WBC 8.1 06/20/2019 1535   WBC 6.0 08/25/2018 0751   RBC 4.20 06/20/2019 1535   RBC 4.22 08/25/2018 0751  HGB 14.0 06/20/2019 1535   HCT 38.9 06/20/2019 1535   PLT 221 06/20/2019 1535   MCV 93 06/20/2019 1535   MCH 33.3 (H) 06/20/2019 1535   MCH 32.5 01/19/2011 0845   MCHC 36.0 (H) 06/20/2019 1535   MCHC 34.7 08/25/2018 0751   RDW 12.9 06/20/2019 1535   LYMPHSABS 1.2 08/25/2018 0751   MONOABS 0.8 08/25/2018 0751   EOSABS 0.4 08/25/2018 0751   BASOSABS 0.1 08/25/2018 0751    BMET    Component Value Date/Time   NA 140 06/20/2019 1535   K 4.4 06/20/2019 1535   CL 105 06/20/2019 1535   CO2 21 06/20/2019 1535   GLUCOSE 93 06/20/2019 1535   GLUCOSE 113 (H) 08/25/2018 0751   BUN 22 06/20/2019 1535   CREATININE 0.80 06/20/2019 1535   CREATININE 1.04 01/09/2016 0832   CALCIUM 9.3 06/20/2019 1535   GFRNONAA 91 06/20/2019 1535   GFRAA 105 06/20/2019 1535    BNP No results found for: BNP  ProBNP No results found for: PROBNP  Imaging: Dg Chest 2 View  Result Date: 06/22/2019 CLINICAL DATA:  Centrilobular emphysema.  Dyspnea. EXAM: CHEST - 2 VIEW COMPARISON:  Chest x-ray dated 08/08/2013 and chest CT dated 07/19/2018 FINDINGS: The heart size and mediastinal contours are within normal limits. Both lungs are clear. No effusions. The emphysema noted on the prior chest CTs not appreciable on these radiographs. The visualized skeletal structures are unremarkable. Aortic atherosclerosis. IMPRESSION: No active cardiopulmonary disease. Aortic Atherosclerosis (ICD10-I70.0). Electronically Signed   By: Lorriane Shire M.D.   On: 06/22/2019 10:05      No flowsheet data found.  No results found for: NITRICOXIDE      Assessment & Plan:   COPD  (chronic obstructive pulmonary disease) Suspect progressive COPD in active smoker  Check bnp , if elevated consider 2 D echo  Check PFT on return   Plan  Patient Instructions  Begin Bevespi 2 puffs twice daily Work on not smoking Follow-up in 6 weeks with Dr. Elsworth Soho or Parrett NP for PFT  Please contact office for sooner follow up if symptoms do not improve or worsen or seek emergency care       Multiple pulmonary nodules LDCT chest planned for 07/2019      Rexene Edison, NP 06/22/2019

## 2019-06-22 NOTE — Addendum Note (Signed)
Addended by: Parke Poisson E on: 06/22/2019 11:47 AM   Modules accepted: Orders

## 2019-06-22 NOTE — Telephone Encounter (Signed)
The patient has been notified of the result and verbalized understanding.  All questions (if any) were answered. Frederik Schmidt, RN 06/22/2019 10:59 AM   His BP has improved since yesterday 124/70.  He will continue to monitor daily and keep [place] updated.

## 2019-06-22 NOTE — Telephone Encounter (Signed)
Patient returning call.

## 2019-06-27 ENCOUNTER — Telehealth: Payer: Self-pay | Admitting: Physician Assistant

## 2019-06-27 NOTE — Telephone Encounter (Signed)
Pt states that his bp is high in the mornings and when he takes his bp and he checks it in the afternoons, it is fine.  Pt advised that Pulmonary put him on a inhaler and he said it seems to be helping.   Pt denies sob and states he hadn't had any in a couple of weeks.

## 2019-06-27 NOTE — Telephone Encounter (Signed)
   Received [facility] cc'd chart. Their notes indicate patient was going to see pulm for recent worsening of SOB. At his recent telemed visit with me the patient indicated he was feeling stable from cardiac standpoint and denied any worsening symptoms. Please find out how SOB is doing with improved BP control and the changes pulmonary made - if still a problem, would recommend arranging in-office visit with EKG for eval. (Recent visit was only telemed.) Juliani Laduke PA-C

## 2019-06-28 DIAGNOSIS — Z85828 Personal history of other malignant neoplasm of skin: Secondary | ICD-10-CM | POA: Diagnosis not present

## 2019-06-28 DIAGNOSIS — L308 Other specified dermatitis: Secondary | ICD-10-CM | POA: Diagnosis not present

## 2019-06-28 DIAGNOSIS — L820 Inflamed seborrheic keratosis: Secondary | ICD-10-CM | POA: Diagnosis not present

## 2019-07-06 ENCOUNTER — Ambulatory Visit (HOSPITAL_COMMUNITY): Payer: Medicare Other | Admitting: Psychiatry

## 2019-07-09 ENCOUNTER — Encounter: Payer: Self-pay | Admitting: Physician Assistant

## 2019-07-09 NOTE — Progress Notes (Signed)
Testing - Entered in Error.

## 2019-07-16 ENCOUNTER — Ambulatory Visit: Payer: Medicare Other | Admitting: Podiatry

## 2019-07-17 ENCOUNTER — Ambulatory Visit (HOSPITAL_COMMUNITY): Payer: Medicare Other | Admitting: Psychiatry

## 2019-07-20 ENCOUNTER — Encounter: Payer: Self-pay | Admitting: Podiatry

## 2019-07-20 ENCOUNTER — Other Ambulatory Visit: Payer: Self-pay

## 2019-07-20 ENCOUNTER — Ambulatory Visit: Payer: Medicare Other | Admitting: Podiatry

## 2019-07-20 DIAGNOSIS — M79676 Pain in unspecified toe(s): Secondary | ICD-10-CM | POA: Diagnosis not present

## 2019-07-20 DIAGNOSIS — B351 Tinea unguium: Secondary | ICD-10-CM | POA: Diagnosis not present

## 2019-07-20 NOTE — Progress Notes (Signed)
Complaint:  Visit Type: Patient returns to my office for continued preventative foot care services. Complaint: Patient states" my nails have grown long and thick and become painful to walk and wear shoes" . The patient presents for preventative foot care services.  Podiatric Exam: Vascular: dorsalis pedis and posterior tibial pulses are palpable bilateral. Capillary return is immediate. Temperature gradient is WNL. Skin turgor WNL  Sensorium: Normal Semmes Weinstein monofilament test. Normal tactile sensation bilaterally. Nail Exam: Pt has thick disfigured discolored nails with subungual debris noted bilateral entire nail hallux through fifth toenails Ulcer Exam: There is no evidence of ulcer or pre-ulcerative changes or infection. Orthopedic Exam: Muscle tone and strength are WNL. No limitations in general ROM. No crepitus or effusions noted. Foot type and digits show no abnormalities. Bony prominences are unremarkable. Skin: No Porokeratosis. No infection or ulcers  Diagnosis:  Onychomycosis, , Pain in right toe, pain in left toes  Treatment & Plan Procedures and Treatment: Consent by patient was obtained for treatment procedures.   Debridement of mycotic and hypertrophic toenails, 1 through 5 bilateral and clearing of subungual debris. No ulceration, no infection noted.  Return Visit-Office Procedure: Patient instructed to return to the office for a follow up visit 3 months for continued evaluation and treatment.    Gardiner Barefoot DPM

## 2019-08-03 ENCOUNTER — Other Ambulatory Visit: Payer: Self-pay

## 2019-08-03 ENCOUNTER — Ambulatory Visit (INDEPENDENT_AMBULATORY_CARE_PROVIDER_SITE_OTHER): Payer: Medicare Other | Admitting: Psychiatry

## 2019-08-03 DIAGNOSIS — F310 Bipolar disorder, current episode hypomanic: Secondary | ICD-10-CM | POA: Diagnosis not present

## 2019-08-03 MED ORDER — FLUOXETINE HCL 20 MG PO CAPS: 20.0000 mg | ORAL_CAPSULE | Freq: Every day | ORAL | 1 refills | Status: DC

## 2019-08-03 MED ORDER — OLANZAPINE 5 MG PO TABS: 5.0000 mg | ORAL_TABLET | Freq: Every day | ORAL | 1 refills | Status: DC

## 2019-08-03 NOTE — Progress Notes (Signed)
Psychiatric Initial Adult Assessment   Patient Identification: Jeffrey Hudson MRN:  [id number] Date of Evaluation:  08/03/2019 Referral Source: Dr. Zettie Pho Chief Complaint:   Visit Diagnosis: Bipolar disorder  History of Present Illness:    Today was her second visit.  Patient is doing very well.  He is very very stable.  This gentleman for the last decades has been on a fixed dose of Prozac 20 mg and Zyprexa 5 mg.  He was diagnosed with bipolar disorder.  He classic features when he presented 20 years ago and fortunately was treated intensively as an outpatient and never needed to be hospitalized.  He had multiple episodes in the distant past of major depression.  At this time his mood is completely stable.  He denies any daily depression or anhedonia.  He denies irritability or euphoria or any signs of mania.  Today he is seen alone.  Her plans are to eventually have his wife and to do some collaboration and share her perspective.  At this time he shows no evidence of tardive dyskinesia.  He is stable well-controlled diabetes and hypercholesterolemia.  The patient shows no evidence of psychosis.  He has 1 son is doing well.  He has no grandchildren.  The patient's health is very good.  Financially he is stable.  He likes the home that he lives in.  He has a good relationship with his family.  The patient does not seem to be all that frightened by the pandemic but does express some mild anxiety.  He functions very well. Associated Signs/Symptoms: Depression Symptoms:  fatigue, (Hypo) Manic Symptoms:   Anxiety Symptoms:   Psychotic Symptoms:   PTSD Symptoms:   Past Psychiatric History: Zyprexa and Prozac, has been psychotherapy in the past including cognitive behavioral therapy.  Previous Psychotropic Medications: Zyprexa and Prozac  Substance Abuse History in the last 12 months:    Consequences of Substance Abuse:   Past Medical History:  Past Medical History:  Diagnosis Date   . AMI (acute myocardial infarction) ([place])    Acute ST elevation  . Anxiety   . Bipolar disorder ([employer])   . COPD (chronic obstructive pulmonary disease) ([place])   . Coronary artery disease    a. s/p anterolateral STEMI with BMS placed in large diagonal branch (2009).  Marland Kitchen Hyperlipidemia   . Hypertension   . Nodule of left lung    6-mm smooothly rounded nodule  . Pre-diabetes   . Syncope and collapse    in 2001 and 2004 with no clear cause  . Tobacco abuse   . Viral URI with cough 11/22/2016    Past Surgical History:  Procedure Laterality Date  . stent placed in heart      Family Psychiatric History:   Family History:  Family History  Problem Relation Age of Onset  . Dementia Mother 23       died  . Alcohol abuse Father 58  . Depression Brother     Social History:   Social History   Socioeconomic History  . Marital status: Married    Spouse name: Not on file  . Number of children: 1  . Years of education: Not on file  . Highest education level: Not on file  Occupational History  . Occupation: Retired-Sales/broker  Social Needs  . Financial resource strain: Not on file  . Food insecurity    Worry: Not on file    Inability: Not on file  . Transportation needs    Medical: Not  on file    Non-medical: Not on file  Tobacco Use  . Smoking status: Current Every Day Smoker    Packs/day: 1.00    Years: 45.00    Pack years: 45.00    Types: Cigarettes  . Smokeless tobacco: Never Used  . Tobacco comment: 1 pack a day  Substance and Sexual Activity  . Alcohol use: Yes    Alcohol/week: 1.0 standard drinks    Types: 1 Standard drinks or equivalent per week    Comment: 2 glasses of wine daily  . Drug use: No    Comment: marijuana quit 2010. He has hx of THC use.  Marland Kitchen Sexual activity: Not on file  Lifestyle  . Physical activity    Days per week: Not on file    Minutes per session: Not on file  . Stress: Not on file  Relationships  . Social Herbalist on  phone: Not on file    Gets together: Not on file    Attends religious service: Not on file    Active member of club or organization: Not on file    Attends meetings of clubs or organizations: Not on file    Relationship status: Not on file  Other Topics Concern  . Not on file  Social History Narrative   He is not working   He has been married for 35 years    One child (son)       He likes to [employer] - plays once a week     Additional Social History:   Allergies:   Allergies  Allergen Reactions  . Lisinopril Swelling    Metabolic Disorder Labs: Lab Results  Component Value Date   HGBA1C 6.1 08/25/2018   MPG 131 06/24/2009   No results found for: PROLACTIN Lab Results  Component Value Date   CHOL 149 06/20/2019   TRIG 93 06/20/2019   HDL 66 06/20/2019   CHOLHDL 2.3 06/20/2019   VLDL 28.2 08/25/2018   LDLCALC 66 06/20/2019   LDLCALC 102 (H) 08/25/2018   Lab Results  Component Value Date   TSH 1.670 06/20/2019    Therapeutic Level Labs: No results found for: LITHIUM No results found for: CBMZ No results found for: VALPROATE  Current Medications: Current Outpatient Medications  Medication Sig Dispense Refill  . albuterol (PROAIR HFA) 108 (90 Base) MCG/ACT inhaler Inhale 2 puffs into the lungs every 6 (six) hours as needed. 1 Inhaler 3  . amLODipine (NORVASC) 5 MG tablet Take 1 tablet (5 mg total) by mouth daily. 180 tablet 3  . aspirin 81 MG tablet Take 81 mg by mouth daily.      Marland Kitchen atorvastatin (LIPITOR) 80 MG tablet Take 1 tablet (80 mg total) by mouth daily at 6 PM. 90 tablet 3  . betamethasone dipropionate 0.05 % cream     . ezetimibe (ZETIA) 10 MG tablet Take 1 tablet (10 mg total) by mouth daily. 90 tablet 3  . FLUoxetine (PROZAC) 20 MG capsule Take 1 capsule (20 mg total) by mouth daily. 90 capsule 1  . Glycopyrrolate-Formoterol (BEVESPI AEROSPHERE) 9-4.8 MCG/ACT AERO Inhale 2 puffs into the lungs 2 (two) times daily. 5.9 g 0  . Glycopyrrolate-Formoterol  (BEVESPI AEROSPHERE) 9-4.8 MCG/ACT AERO Inhale 2 puffs into the lungs 2 (two) times daily. 10.7 g 5  . meloxicam (MOBIC) 7.5 MG tablet Take 7.5 mg by mouth as needed for pain.    . metoprolol succinate (TOPROL-XL) 25 MG 24 hr tablet TAKE 1  TABLET BY MOUTH  DAILY. 90 tablet 3  . Multiple Vitamin (MULTIVITAMIN) tablet Take 1 tablet by mouth daily.      . nitroGLYCERIN (NITROSTAT) 0.4 MG SL tablet Place 0.4 mg under the tongue every 5 (five) minutes as needed for chest pain.    Marland Kitchen OLANZapine (ZYPREXA) 5 MG tablet Take 1 tablet (5 mg total) by mouth at bedtime. 90 tablet 1  . timolol (BETIMOL) 0.25 % ophthalmic solution Place 1-2 drops into both eyes daily.    . travoprost, benzalkonium, (TRAVATAN) 0.004 % ophthalmic solution Place 1 drop into both eyes at bedtime.     No current facility-administered medications for this visit.     Musculoskeletal: Strength & Muscle Tone: within normal limits Gait & Station: normal Patient leans: N/A  Psychiatric Specialty Exam: ROS  There were no vitals taken for this visit.There is no height or weight on file to calculate BMI.  General Appearance: Casual  Eye Contact:  Good  Speech:  Clear and Coherent  Volume:  Normal  Mood:  Negative  Affect:  Appropriate  Thought Process:  Goal Directed  Orientation:  Full (Time, Place, and Person)  Thought Content:  WDL  Suicidal Thoughts:  No  Homicidal Thoughts:  No  Memory:  NA  Judgement:  Good  Insight:  NA and Good  Psychomotor Activity:  Normal  Concentration:    Recall:  Good  Fund of Knowledge:Good  Language: Good  Akathisia:  No  Handed:  Right  AIMS (if indicated):  not done  Assets:  Desire for Improvement  ADL's:  Intact  Cognition: WNL  Sleep:  Good   Screenings: PHQ2-9     Office Visit from 02/04/2015 in [place] at [facility]  PHQ-2 Total Score  0      Assessment and Plan:  This patient is #1 problem is that of bipolar disorder most recent episode manic.  The patient  has been doing great taking Zyprexa 5 mg and Prozac 20 mg for 2 decades.  He shows no consequences from taking Zyprexa over that period of time.  His weight is stable and he feels very healthy.  He is somewhat resistant and hesitant to make any changes.  He recently came from the [place] system and is glad is here instead.  Over the next years I plan to build a relationship with him and talk to him about the possibility of slightly reducing his Zyprexa slowly.  At this time he is doing very well and will continue things as are is.  Will be asked to return to see me in 5 months.  Jerral Ralph, MD 10/16/202010:19 AM

## 2019-08-06 ENCOUNTER — Ambulatory Visit (INDEPENDENT_AMBULATORY_CARE_PROVIDER_SITE_OTHER)
Admission: RE | Admit: 2019-08-06 | Discharge: 2019-08-06 | Disposition: A | Discharge status: Home / Self Care | Payer: Medicare Other | Source: Ambulatory Visit | Attending: Acute Care | Admitting: Acute Care

## 2019-08-06 ENCOUNTER — Other Ambulatory Visit: Payer: Self-pay

## 2019-08-06 DIAGNOSIS — Z122 Encounter for screening for malignant neoplasm of respiratory organs: Secondary | ICD-10-CM

## 2019-08-06 DIAGNOSIS — Z87891 Personal history of nicotine dependence: Secondary | ICD-10-CM

## 2019-08-06 DIAGNOSIS — F17210 Nicotine dependence, cigarettes, uncomplicated: Secondary | ICD-10-CM

## 2019-08-08 ENCOUNTER — Telehealth: Payer: Self-pay | Admitting: Adult Health

## 2019-08-08 DIAGNOSIS — Z87891 Personal history of nicotine dependence: Secondary | ICD-10-CM

## 2019-08-08 DIAGNOSIS — Z122 Encounter for screening for malignant neoplasm of respiratory organs: Secondary | ICD-10-CM

## 2019-08-08 DIAGNOSIS — F17210 Nicotine dependence, cigarettes, uncomplicated: Secondary | ICD-10-CM

## 2019-08-08 NOTE — Telephone Encounter (Signed)
Langley Gauss called for CT results. Will route to [place].

## 2019-08-08 NOTE — Telephone Encounter (Signed)
Pt informed of CT results per Sarah Groce, NP.  PT verbalized understanding.  Copy sent to PCP.  Order placed for 1 yr f/u CT.  

## 2019-08-22 ENCOUNTER — Other Ambulatory Visit: Payer: Self-pay | Admitting: Cardiovascular Disease

## 2019-08-22 MED ORDER — AMLODIPINE BESYLATE 5 MG PO TABS: 5.0000 mg | ORAL_TABLET | Freq: Every day | ORAL | 2 refills | Status: DC

## 2019-10-26 ENCOUNTER — Other Ambulatory Visit: Payer: Self-pay

## 2019-10-26 ENCOUNTER — Encounter: Payer: Self-pay | Admitting: Podiatry

## 2019-10-26 ENCOUNTER — Ambulatory Visit: Payer: Medicare Other | Admitting: Podiatry

## 2019-10-26 DIAGNOSIS — B351 Tinea unguium: Secondary | ICD-10-CM | POA: Diagnosis not present

## 2019-10-26 DIAGNOSIS — M79676 Pain in unspecified toe(s): Secondary | ICD-10-CM

## 2019-10-26 NOTE — Progress Notes (Signed)
Complaint:  Visit Type: Patient returns to my office for continued preventative foot care services. Complaint: Patient states" my nails have grown long and thick and become painful to walk and wear shoes" . The patient presents for preventative foot care services.  Podiatric Exam: Vascular: dorsalis pedis and posterior tibial pulses are palpable bilateral. Capillary return is immediate. Temperature gradient is WNL. Skin turgor WNL  Sensorium: Normal Semmes Weinstein monofilament test. Normal tactile sensation bilaterally. Nail Exam: Pt has thick disfigured discolored nails with subungual debris noted bilateral entire nail hallux through fifth toenails Ulcer Exam: There is no evidence of ulcer or pre-ulcerative changes or infection. Orthopedic Exam: Muscle tone and strength are WNL. No limitations in general ROM. No crepitus or effusions noted. Foot type and digits show no abnormalities. Bony prominences are unremarkable. Skin: No Porokeratosis. No infection or ulcers  Diagnosis:  Onychomycosis, , Pain in right toe, pain in left toes  Treatment & Plan Procedures and Treatment: Consent by patient was obtained for treatment procedures.   Debridement of mycotic and hypertrophic toenails, 1 through 5 bilateral and clearing of subungual debris. No ulceration, no infection noted.  Return Visit-Office Procedure: Patient instructed to return to the office for a follow up visit 3 months for continued evaluation and treatment.    Gardiner Barefoot DPM

## 2019-11-25 ENCOUNTER — Ambulatory Visit: Payer: Self-pay

## 2019-12-01 ENCOUNTER — Ambulatory Visit: Payer: Medicare Other

## 2019-12-02 ENCOUNTER — Ambulatory Visit: Payer: Medicare Other | Attending: Internal Medicine

## 2019-12-02 DIAGNOSIS — Z23 Encounter for immunization: Secondary | ICD-10-CM

## 2019-12-02 NOTE — Progress Notes (Signed)
   Covid-19 Vaccination Clinic  Name:  DAT BJORKMAN    MRN: [id number] DOB: [birth year]  12/02/2019  Mr. Arvizo was observed post Covid-19 immunization for 30 minutes based on pre-vaccination screening without incidence. He was provided with Vaccine Information Sheet and instruction to access the V-Safe system.   Mr. Vanriper was instructed to call 911 with any severe reactions post vaccine: Marland Kitchen Difficulty breathing  . Swelling of your face and throat  . A fast heartbeat  . A bad rash all over your body  . Dizziness and weakness    Immunizations Administered    Name Date Dose VIS Date Route   Pfizer COVID-19 Vaccine 12/02/2019  9:36 AM 0.3 mL 09/28/2019 Intramuscular   Manufacturer: [place]   Lot: [id number]   [place]: [id number]

## 2019-12-14 ENCOUNTER — Ambulatory Visit: Payer: Medicare Other

## 2019-12-25 ENCOUNTER — Ambulatory Visit: Payer: Medicare Other | Attending: Internal Medicine

## 2019-12-25 DIAGNOSIS — Z23 Encounter for immunization: Secondary | ICD-10-CM | POA: Insufficient documentation

## 2019-12-25 NOTE — Progress Notes (Signed)
   Covid-19 Vaccination Clinic  Name:  Jeffrey Hudson    MRN: [id number] DOB: Apr 19, 1951  12/25/2019  [name] was observed post Covid-19 immunization for 15 minutes without incident. He was provided with Vaccine Information Sheet and instruction to access the V-Safe system.   [name] was instructed to call 911 with any severe reactions post vaccine: Marland Kitchen Difficulty breathing  . Swelling of face and throat  . A fast heartbeat  . A bad rash all over body  . Dizziness and weakness   Immunizations Administered    Name Date Dose VIS Date Route   Pfizer COVID-19 Vaccine 12/25/2019  9:27 AM 0.3 mL 09/28/2019 Intramuscular   Manufacturer: [place]   Lot: [id number]   [place]: [id number]

## 2019-12-28 ENCOUNTER — Ambulatory Visit: Payer: Self-pay | Admitting: Adult Health

## 2020-01-04 ENCOUNTER — Other Ambulatory Visit: Payer: Self-pay

## 2020-01-04 ENCOUNTER — Ambulatory Visit (INDEPENDENT_AMBULATORY_CARE_PROVIDER_SITE_OTHER): Payer: Medicare Other | Admitting: Psychiatry

## 2020-01-04 DIAGNOSIS — F319 Bipolar disorder, unspecified: Secondary | ICD-10-CM | POA: Diagnosis not present

## 2020-01-04 DIAGNOSIS — F3160 Bipolar disorder, current episode mixed, unspecified: Secondary | ICD-10-CM

## 2020-01-04 MED ORDER — OLANZAPINE 5 MG PO TABS: 5.0000 mg | ORAL_TABLET | Freq: Every day | ORAL | 1 refills | Status: DC

## 2020-01-04 MED ORDER — FLUOXETINE HCL 20 MG PO CAPS: 20.0000 mg | ORAL_CAPSULE | Freq: Every day | ORAL | 1 refills | Status: DC

## 2020-01-04 NOTE — Progress Notes (Signed)
Psychiatric Initial Adult Assessment   Patient Identification: Jeffrey Hudson MRN:  [id number] Date of Evaluation:  01/04/2020 Referral Source: Dr. Zettie Pho Chief Complaint:   Visit Diagnosis: Bipolar disorder  History of Present Illness:    Today the patient is doing great.  He is very stable.  He denies any depression or symptoms of mania.  He is sleeping and eating very well.  He has been retired for many years.  His wife who is younger than him still is working.  The patient listens to his radio show for hours of the morning and he gets on the computer.  He tries to stay active.  His energy level is good.  He has no problems thinking and concentrating.  He has no signs of psychosis.  He is functioning very well.  Today we reviewed his history and that is been taking Prozac and Zyprexa for a long period of time.  We clarified that he get back does not have diabetes but that he does have coronary artery disease, had an MI and has a stent.  He sees his cardiologist regularly yearly.  The patient denies any chest pain or shortness of breath.  He denies any cough.  The patient drinks no alcohol uses no drugs.  His health is actually very good.  Financially he is very stable.  He likes his home.  Patient takes his medicines just as prescribed. Associated Signs/Symptoms: Depression Symptoms:  fatigue, (Hypo) Manic Symptoms:   Anxiety Symptoms:   Psychotic Symptoms:   PTSD Symptoms:   Past Psychiatric History: Zyprexa and Prozac, has been psychotherapy in the past including cognitive behavioral therapy.  Previous Psychotropic Medications: Zyprexa and Prozac  Substance Abuse History in the last 12 months:    Consequences of Substance Abuse:   Past Medical History:  Past Medical History:  Diagnosis Date  . AMI (acute myocardial infarction) ([place])    Acute ST elevation  . Anxiety   . Bipolar disorder ([place])   . COPD (chronic obstructive pulmonary disease) ([place])   . Coronary artery  disease    a. s/p anterolateral STEMI with BMS placed in large diagonal branch (2009).  Marland Kitchen Hyperlipidemia   . Hypertension   . Nodule of left lung    6-mm smooothly rounded nodule  . Pre-diabetes   . Syncope and collapse    in 2001 and 2004 with no clear cause  . Tobacco abuse   . Viral URI with cough 11/22/2016    Past Surgical History:  Procedure Laterality Date  . stent placed in heart      Family Psychiatric History:   Family History:  Family History  Problem Relation Age of Onset  . Dementia Mother 45       died  . Alcohol abuse Father 10  . Depression Brother     Social History:   Social History   Socioeconomic History  . Marital status: Married    Spouse name: Not on file  . Number of children: 1  . Years of education: Not on file  . Highest education level: Not on file  Occupational History  . Occupation: Retired-Sales/broker  Tobacco Use  . Smoking status: Current Every Day Smoker    Packs/day: 1.00    Years: 45.00    Pack years: 45.00    Types: Cigarettes  . Smokeless tobacco: Never Used  . Tobacco comment: 1 pack a day  Substance and Sexual Activity  . Alcohol use: Yes    Alcohol/week: 1.0 standard drinks  Types: 1 Standard drinks or equivalent per week    Comment: 2 glasses of wine daily  . Drug use: No    Comment: marijuana quit 2010. He has hx of THC use.  Marland Kitchen Sexual activity: Not on file  Other Topics Concern  . Not on file  Social History Narrative   He is not working   He has been married for 35 years    One child (son)       He likes to [employer] - plays once a week    Social Determinants of Radio broadcast assistant Strain:   . Difficulty of Paying Living Expenses:   Food Insecurity:   . Worried About Charity fundraiser in the Last Year:   . Arboriculturist in the Last Year:   Transportation Needs:   . Film/video editor (Medical):   Marland Kitchen Lack of Transportation (Non-Medical):   Physical Activity:   . Days of Exercise per Week:    . Minutes of Exercise per Session:   Stress:   . Feeling of Stress :   Social Connections:   . Frequency of Communication with Friends and Family:   . Frequency of Social Gatherings with Friends and Family:   . Attends Religious Services:   . Active Member of Clubs or Organizations:   . Attends Archivist Meetings:   Marland Kitchen Marital Status:     Additional Social History:   Allergies:   Allergies  Allergen Reactions  . Lisinopril Swelling    Metabolic Disorder Labs: Lab Results  Component Value Date   HGBA1C 6.1 08/25/2018   MPG 131 06/24/2009   No results found for: PROLACTIN Lab Results  Component Value Date   CHOL 149 06/20/2019   TRIG 93 06/20/2019   HDL 66 06/20/2019   CHOLHDL 2.3 06/20/2019   VLDL 28.2 08/25/2018   LDLCALC 66 06/20/2019   LDLCALC 102 (H) 08/25/2018   Lab Results  Component Value Date   TSH 1.670 06/20/2019    Therapeutic Level Labs: No results found for: LITHIUM No results found for: CBMZ No results found for: VALPROATE  Current Medications: Current Outpatient Medications  Medication Sig Dispense Refill  . albuterol (PROAIR HFA) 108 (90 Base) MCG/ACT inhaler Inhale 2 puffs into the lungs every 6 (six) hours as needed. 1 Inhaler 3  . amLODipine (NORVASC) 5 MG tablet Take 1 tablet (5 mg total) by mouth daily. 180 tablet 2  . aspirin 81 MG tablet Take 81 mg by mouth daily.      Marland Kitchen atorvastatin (LIPITOR) 80 MG tablet Take 1 tablet (80 mg total) by mouth daily at 6 PM. 90 tablet 3  . betamethasone dipropionate 0.05 % cream     . ezetimibe (ZETIA) 10 MG tablet Take 1 tablet (10 mg total) by mouth daily. 90 tablet 3  . FLUoxetine (PROZAC) 20 MG capsule Take 1 capsule (20 mg total) by mouth daily. 90 capsule 1  . Glycopyrrolate-Formoterol (BEVESPI AEROSPHERE) 9-4.8 MCG/ACT AERO Inhale 2 puffs into the lungs 2 (two) times daily. 5.9 g 0  . Glycopyrrolate-Formoterol (BEVESPI AEROSPHERE) 9-4.8 MCG/ACT AERO Inhale 2 puffs into the lungs 2 (two)  times daily. 10.7 g 5  . meloxicam (MOBIC) 7.5 MG tablet Take 7.5 mg by mouth as needed for pain.    . metoprolol succinate (TOPROL-XL) 25 MG 24 hr tablet TAKE 1 TABLET BY MOUTH  DAILY. 90 tablet 3  . Multiple Vitamin (MULTIVITAMIN) tablet Take 1 tablet by mouth daily.      Marland Kitchen  nitroGLYCERIN (NITROSTAT) 0.4 MG SL tablet Place 0.4 mg under the tongue every 5 (five) minutes as needed for chest pain.    Marland Kitchen OLANZapine (ZYPREXA) 5 MG tablet Take 1 tablet (5 mg total) by mouth at bedtime. 90 tablet 1  . timolol (BETIMOL) 0.25 % ophthalmic solution Place 1-2 drops into both eyes daily.    . travoprost, benzalkonium, (TRAVATAN) 0.004 % ophthalmic solution Place 1 drop into both eyes at bedtime.     No current facility-administered medications for this visit.    Musculoskeletal: Strength & Muscle Tone: within normal limits Gait & Station: normal Patient leans: N/A  Psychiatric Specialty Exam: ROS  There were no vitals taken for this visit.There is no height or weight on file to calculate BMI.  General Appearance: Casual  Eye Contact:  Good  Speech:  Clear and Coherent  Volume:  Normal  Mood:  Negative  Affect:  Appropriate  Thought Process:  Goal Directed  Orientation:  Full (Time, Place, and Person)  Thought Content:  WDL  Suicidal Thoughts:  No  Homicidal Thoughts:  No  Memory:  NA  Judgement:  Good  Insight:  NA and Good  Psychomotor Activity:  Normal  Concentration:    Recall:  Good  Fund of Knowledge:Good  Language: Good  Akathisia:  No  Handed:  Right  AIMS (if indicated):  not done  Assets:  Desire for Improvement  ADL's:  Intact  Cognition: WNL  Sleep:  Good   Screenings: PHQ2-9     Office Visit from 02/04/2015 in [facility] [place]  PHQ-2 Total Score  0      Assessment and Plan:   This patient is diagnosis is that of bipolar disorder in remission.  Today reviewed both his medications and at this time I choose not to change any of them.  He takes  Prozac 20 mg and Zyprexa 5 mg.  In his initial evaluation he showed no evidence of tardive dyskinesia.  Patient denies any side effects from his medications.  Is been on them more than a decade.  The possibility of slowly reducing his Zyprexa is not out of the question.  At this time he is hesitant to make any changes particularly in the times of the pandemic.  At this time I am not anxious to change them either.  He is not having any consequences from them.  I will continue discussing the possibility of reducing them but certainly I will not take any changes without at least talking to his family-his wife.  When his next visit in 4 months he will come to see me and we have asked him to bring his wife as well.  We will get her perception and perspective as well.  At this time the patient is very stable and I will continue these 2 medications.Jerral Ralph, MD 3/19/20219:42 AM

## 2020-01-25 ENCOUNTER — Ambulatory Visit: Payer: Medicare Other | Admitting: Podiatry

## 2020-02-15 ENCOUNTER — Ambulatory Visit: Payer: Medicare Other | Admitting: Podiatry

## 2020-02-22 ENCOUNTER — Other Ambulatory Visit: Payer: Self-pay

## 2020-02-22 ENCOUNTER — Ambulatory Visit: Payer: Medicare Other | Admitting: Podiatry

## 2020-02-22 ENCOUNTER — Encounter: Payer: Self-pay | Admitting: Podiatry

## 2020-02-22 VITALS — Temp 97.5°F

## 2020-02-22 DIAGNOSIS — M79676 Pain in unspecified toe(s): Secondary | ICD-10-CM

## 2020-02-22 DIAGNOSIS — B351 Tinea unguium: Secondary | ICD-10-CM | POA: Diagnosis not present

## 2020-02-22 NOTE — Progress Notes (Signed)
This patient returns to the office for evaluation and treatment of long thick painful nails .  This patient is unable to trim his own nails since the patient cannot reach the feet.  Patient says the nails are painful walking and wearing his shoes.  He returns for preventive foot care services.  General Appearance  Alert, conversant and in no acute stress.  Vascular  Dorsalis pedis and posterior tibial  pulses are palpable  bilaterally.  Capillary return is within normal limits  bilaterally. Temperature is within normal limits  bilaterally.  Neurologic  Senn-Weinstein monofilament wire test within normal limits  bilaterally. Muscle power within normal limits bilaterally.  Nails Thick disfigured discolored nails with subungual debris  from hallux to fifth toes bilaterally. No evidence of bacterial infection or drainage bilaterally.  Orthopedic  No limitations of motion  feet .  No crepitus or effusions noted.  No bony pathology or digital deformities noted.  Skin  normotropic skin with no porokeratosis noted bilaterally.  No signs of infections or ulcers noted.     Onychomycosis  Pain in toes right foot  Pain in toes left foot  Debridement  of nails  1-5  B/L with a nail nipper.  Nails were then filed using a dremel tool with no incidents.    RTC 3 months    Malaka Ruffner DPM  

## 2020-03-10 DIAGNOSIS — H401131 Primary open-angle glaucoma, bilateral, mild stage: Secondary | ICD-10-CM | POA: Diagnosis not present

## 2020-04-23 ENCOUNTER — Ambulatory Visit (INDEPENDENT_AMBULATORY_CARE_PROVIDER_SITE_OTHER): Payer: Medicare Other | Admitting: Adult Health

## 2020-04-23 ENCOUNTER — Other Ambulatory Visit: Payer: Self-pay

## 2020-04-23 ENCOUNTER — Other Ambulatory Visit (INDEPENDENT_AMBULATORY_CARE_PROVIDER_SITE_OTHER): Payer: Medicare Other

## 2020-04-23 ENCOUNTER — Encounter: Payer: Self-pay | Admitting: Adult Health

## 2020-04-23 VITALS — BP 128/70 | Temp 98.0°F | Wt 171.0 lb

## 2020-04-23 DIAGNOSIS — R600 Localized edema: Secondary | ICD-10-CM

## 2020-04-23 LAB — BASIC METABOLIC PANEL
BUN: 15 mg/dL (ref 6–23)
CO2: 27 mEq/L (ref 19–32)
Calcium: 9.4 mg/dL (ref 8.4–10.5)
Chloride: 101 mEq/L (ref 96–112)
Creatinine, Ser: 0.84 mg/dL (ref 0.40–1.50)
GFR: 90.19 mL/min (ref >60.00)
Glucose, Bld: 110 mg/dL — ABNORMAL HIGH (ref 70–99)
Potassium: 3.9 mEq/L (ref 3.5–5.1)
Sodium: 138 mEq/L (ref 135–145)

## 2020-04-23 LAB — BRAIN NATRIURETIC PEPTIDE: Pro B Natriuretic peptide (BNP): 77 pg/mL (ref 0.0–100.0)

## 2020-04-23 MED ORDER — HYDROCHLOROTHIAZIDE 25 MG PO TABS: 25.0000 mg | ORAL_TABLET | Freq: Every day | ORAL | 0 refills | Status: DC

## 2020-04-23 NOTE — Patient Instructions (Signed)
We will check some lab work today.   I am going to have you stop Norvasc for the next week and follow up here this time next week   I have sent in HCTZ to the pharmacy, take that if blood pressure goes above 150/80 consistently.

## 2020-04-23 NOTE — Progress Notes (Signed)
Subjective:    Patient ID: Jeffrey Hudson, male    DOB: 02-Nov-1950, 69 y.o.   MRN: [id number]  HPI  69 year old male who  has a past medical history of AMI (acute myocardial infarction) ([place]), Anxiety, Bipolar disorder ([place]), COPD (chronic obstructive pulmonary disease) ([place]), Coronary artery disease, Hyperlipidemia, Hypertension, Nodule of left lung, Pre-diabetes, Syncope and collapse, Tobacco abuse, and Viral URI with cough (11/22/2016).  He presents to the office today for an acute issue of bilateral lower extremity edema over the last 3-4  months and have been progressively been getting worse. Swelling is from feet to mid calf.   He denies high sodium diet, does not eat fried foods but does not drink a lot of water.   Was started on Norvasc in September 2020  He denies chest pain, shortness of breath, or calf pain  Review of Systems See HPI   Past Medical History:  Diagnosis Date  . AMI (acute myocardial infarction) ([place])    Acute ST elevation  . Anxiety   . Bipolar disorder ([place])   . COPD (chronic obstructive pulmonary disease) ([place])   . Coronary artery disease    a. s/p anterolateral STEMI with BMS placed in large diagonal branch (2009).  Marland Kitchen Hyperlipidemia   . Hypertension   . Nodule of left lung    6-mm smooothly rounded nodule  . Pre-diabetes   . Syncope and collapse    in 2001 and 2004 with no clear cause  . Tobacco abuse   . Viral URI with cough 11/22/2016    Social History   Socioeconomic History  . Marital status: Married    Spouse name: Not on file  . Number of children: 1  . Years of education: Not on file  . Highest education level: Not on file  Occupational History  . Occupation: Retired-Sales/broker  Tobacco Use  . Smoking status: Current Every Day Smoker    Packs/day: 1.00    Years: 45.00    Pack years: 45.00    Types: Cigarettes  . Smokeless tobacco: Never Used  . Tobacco comment: 1 pack a day  Vaping Use  . Vaping Use: Never used    Substance and Sexual Activity  . Alcohol use: Yes    Alcohol/week: 1.0 standard drink    Types: 1 Standard drinks or equivalent per week    Comment: 2 glasses of wine daily  . Drug use: No    Comment: marijuana quit 2010. He has hx of THC use.  Marland Kitchen Sexual activity: Not on file  Other Topics Concern  . Not on file  Social History Narrative   He is not working   He has been married for 35 years    One child (son)       He likes to [employer] - plays once a week    Social Determinants of Radio broadcast assistant Strain:   . Difficulty of Paying Living Expenses:   Food Insecurity:   . Worried About Charity fundraiser in the Last Year:   . Arboriculturist in the Last Year:   Transportation Needs:   . Film/video editor (Medical):   Marland Kitchen Lack of Transportation (Non-Medical):   Physical Activity:   . Days of Exercise per Week:   . Minutes of Exercise per Session:   Stress:   . Feeling of Stress :   Social Connections:   . Frequency of Communication with Friends and Family:   .  Frequency of Social Gatherings with Friends and Family:   . Attends Religious Services:   . Active Member of Clubs or Organizations:   . Attends Archivist Meetings:   Marland Kitchen Marital Status:   Intimate Partner Violence:   . Fear of Current or Ex-Partner:   . Emotionally Abused:   Marland Kitchen Physically Abused:   . Sexually Abused:     Past Surgical History:  Procedure Laterality Date  . stent placed in heart      Family History  Problem Relation Age of Onset  . Dementia Mother 64       died  . Alcohol abuse Father 34  . Depression Brother     Allergies  Allergen Reactions  . Lisinopril Swelling    Current Outpatient Medications on File Prior to Visit  Medication Sig Dispense Refill  . albuterol (PROAIR HFA) 108 (90 Base) MCG/ACT inhaler Inhale 2 puffs into the lungs every 6 (six) hours as needed. 1 Inhaler 3  . amLODipine (NORVASC) 5 MG tablet Take 1 tablet (5 mg total) by mouth daily. 180  tablet 2  . aspirin 81 MG tablet Take 81 mg by mouth daily.      Marland Kitchen atorvastatin (LIPITOR) 80 MG tablet Take 1 tablet (80 mg total) by mouth daily at 6 PM. 90 tablet 3  . betamethasone dipropionate 0.05 % cream     . ezetimibe (ZETIA) 10 MG tablet Take 1 tablet (10 mg total) by mouth daily. 90 tablet 3  . FLUoxetine (PROZAC) 20 MG capsule Take 1 capsule (20 mg total) by mouth daily. 90 capsule 1  . Glycopyrrolate-Formoterol (BEVESPI AEROSPHERE) 9-4.8 MCG/ACT AERO Inhale 2 puffs into the lungs 2 (two) times daily. 10.7 g 5  . meloxicam (MOBIC) 7.5 MG tablet Take 7.5 mg by mouth as needed for pain.    . metoprolol succinate (TOPROL-XL) 25 MG 24 hr tablet TAKE 1 TABLET BY MOUTH  DAILY. 90 tablet 3  . Multiple Vitamin (MULTIVITAMIN) tablet Take 1 tablet by mouth daily.      . nitroGLYCERIN (NITROSTAT) 0.4 MG SL tablet Place 0.4 mg under the tongue every 5 (five) minutes as needed for chest pain.    Marland Kitchen OLANZapine (ZYPREXA) 5 MG tablet Take 1 tablet (5 mg total) by mouth at bedtime. 90 tablet 1  . timolol (BETIMOL) 0.25 % ophthalmic solution Place 1-2 drops into both eyes daily.    . travoprost, benzalkonium, (TRAVATAN) 0.004 % ophthalmic solution Place 1 drop into both eyes at bedtime.     No current facility-administered medications on file prior to visit.    BP 128/70   Temp 98 F (36.7 C)   Wt 171 lb (77.6 kg)   BMI 27.60 kg/m       Objective:   Physical Exam Vitals and nursing note reviewed.  Constitutional:      Appearance: Normal appearance.  Cardiovascular:     Rate and Rhythm: Normal rate and regular rhythm.     Pulses: Normal pulses.     Heart sounds: Normal heart sounds.     Comments: No calf pain, redness, or warmth  Pulmonary:     Effort: Pulmonary effort is normal.     Breath sounds: Normal breath sounds.  Musculoskeletal:     Right lower leg: 2+ Pitting Edema (from foot to mid calf) present.     Left lower leg: 2+ Pitting Edema (from foot to mid calf ) present.    Skin:    General: Skin is warm and  dry.  Neurological:     General: No focal deficit present.     Mental Status: He is alert and oriented to person, place, and time.  Psychiatric:        Mood and Affect: Mood normal.        Behavior: Behavior normal.        Thought Content: Thought content normal.        Judgment: Judgment normal.       Assessment & Plan:  1. Lower extremity edema -Likely due to Norvasc doubt CHF or DVT.  I will have him stop Norvasc for the next week, monitor blood pressure at home as well as edema.  If blood pressure goes above 150/80 consistently then he can take hydrochlorothiazide this week.  Advised to elevate legs rest.  Will check BNP for CHF but doubtful this is the cause. -Follow-up in 1 week or sooner if needed - hydrochlorothiazide (HYDRODIURIL) 25 MG tablet; Take 1 tablet (25 mg total) by mouth daily.  Dispense: 30 tablet; Refill: 0 - Brain Natriuretic Peptide - Basic Metabolic Panel  Dorothyann Peng, NP

## 2020-04-30 ENCOUNTER — Ambulatory Visit (HOSPITAL_COMMUNITY): Payer: Medicare Other | Admitting: Psychiatry

## 2020-05-01 ENCOUNTER — Other Ambulatory Visit: Payer: Self-pay

## 2020-05-01 ENCOUNTER — Encounter: Payer: Self-pay | Admitting: Adult Health

## 2020-05-01 ENCOUNTER — Ambulatory Visit (INDEPENDENT_AMBULATORY_CARE_PROVIDER_SITE_OTHER): Payer: Medicare Other | Admitting: Adult Health

## 2020-05-01 VITALS — BP 130/70 | Temp 99.1°F | Wt 167.0 lb

## 2020-05-01 DIAGNOSIS — I10 Essential (primary) hypertension: Secondary | ICD-10-CM

## 2020-05-01 DIAGNOSIS — R600 Localized edema: Secondary | ICD-10-CM | POA: Diagnosis not present

## 2020-05-01 MED ORDER — HYDROCHLOROTHIAZIDE 25 MG PO TABS: 25.0000 mg | ORAL_TABLET | Freq: Every day | ORAL | 3 refills | Status: DC

## 2020-05-01 NOTE — Progress Notes (Signed)
Subjective:    Patient ID: Jeffrey Hudson, male    DOB: 03-Nov-1950, 69 y.o.   MRN: [id number]  HPI  69 year old male who  has a past medical history of AMI (acute myocardial infarction) ([place]), Anxiety, Bipolar disorder ([place]), COPD (chronic obstructive pulmonary disease) ([place]), Coronary artery disease, Hyperlipidemia, Hypertension, Nodule of left lung, Pre-diabetes, Syncope and collapse, Tobacco abuse, and Viral URI with cough (11/22/2016).  He presents to the office today for 1 week follow-up regarding bilateral lower extremity edema.  When he was seen on 04/23/2020 he reported that he has noticed edema in both lower extremities over the past 3 to 4 months that has been getting progressively worse.  Swelling was present from the foot to the mid calf.  His labs including BNP were normal and it was thought that the lower extremity edema may be coming from his blood pressure medication Norvasc.  We stopped the Norvasc and he was advised to monitor his blood pressure at home and if needed take hydrochlorothiazide.  Today he reports that a few days after stopping Norvasc he did notice some mild improvement in his lower extremity edema but his blood pressure did elevate to 175 systolic.  He started taking the hydrochlorothiazide and noticed significant improvement in his edema.  Blood pressures have been normotensive in the early afternoon and evening but he when he wakes up in the morning its been elevated as high as 102 systolic.  Review of Systems See HPI   Past Medical History:  Diagnosis Date  . AMI (acute myocardial infarction) ([place])    Acute ST elevation  . Anxiety   . Bipolar disorder ([place])   . COPD (chronic obstructive pulmonary disease) ([place])   . Coronary artery disease    a. s/p anterolateral STEMI with BMS placed in large diagonal branch (2009).  Marland Kitchen Hyperlipidemia   . Hypertension   . Nodule of left lung    6-mm smooothly rounded nodule  . Pre-diabetes   . Syncope and collapse     in 2001 and 2004 with no clear cause  . Tobacco abuse   . Viral URI with cough 11/22/2016    Social History   Socioeconomic History  . Marital status: Married    Spouse name: Not on file  . Number of children: 1  . Years of education: Not on file  . Highest education level: Not on file  Occupational History  . Occupation: Retired-Sales/broker  Tobacco Use  . Smoking status: Current Every Day Smoker    Packs/day: 1.00    Years: 45.00    Pack years: 45.00    Types: Cigarettes  . Smokeless tobacco: Never Used  . Tobacco comment: 1 pack a day  Vaping Use  . Vaping Use: Never used  Substance and Sexual Activity  . Alcohol use: Yes    Alcohol/week: 1.0 standard drink    Types: 1 Standard drinks or equivalent per week    Comment: 2 glasses of wine daily  . Drug use: No    Comment: marijuana quit 2010. He has hx of THC use.  Marland Kitchen Sexual activity: Not on file  Other Topics Concern  . Not on file  Social History Narrative   He is not working   He has been married for 35 years    One child (son)       He likes to [employer] - plays once a week    Social Determinants of Radio broadcast assistant Strain:   .  Difficulty of Paying Living Expenses:   Food Insecurity:   . Worried About Charity fundraiser in the Last Year:   . Arboriculturist in the Last Year:   Transportation Needs:   . Film/video editor (Medical):   Marland Kitchen Lack of Transportation (Non-Medical):   Physical Activity:   . Days of Exercise per Week:   . Minutes of Exercise per Session:   Stress:   . Feeling of Stress :   Social Connections:   . Frequency of Communication with Friends and Family:   . Frequency of Social Gatherings with Friends and Family:   . Attends Religious Services:   . Active Member of Clubs or Organizations:   . Attends Archivist Meetings:   Marland Kitchen Marital Status:   Intimate Partner Violence:   . Fear of Current or Ex-Partner:   . Emotionally Abused:   Marland Kitchen Physically Abused:   .  Sexually Abused:     Past Surgical History:  Procedure Laterality Date  . stent placed in heart      Family History  Problem Relation Age of Onset  . Dementia Mother 24       died  . Alcohol abuse Father 41  . Depression Brother     Allergies  Allergen Reactions  . Lisinopril Swelling  . Norvasc [Amlodipine] Swelling    Lower extremity     Current Outpatient Medications on File Prior to Visit  Medication Sig Dispense Refill  . albuterol (PROAIR HFA) 108 (90 Base) MCG/ACT inhaler Inhale 2 puffs into the lungs every 6 (six) hours as needed. 1 Inhaler 3  . amLODipine (NORVASC) 5 MG tablet Take 1 tablet (5 mg total) by mouth daily. 180 tablet 2  . aspirin 81 MG tablet Take 81 mg by mouth daily.      Marland Kitchen atorvastatin (LIPITOR) 80 MG tablet Take 1 tablet (80 mg total) by mouth daily at 6 PM. 90 tablet 3  . betamethasone dipropionate 0.05 % cream     . ezetimibe (ZETIA) 10 MG tablet Take 1 tablet (10 mg total) by mouth daily. 90 tablet 3  . FLUoxetine (PROZAC) 20 MG capsule Take 1 capsule (20 mg total) by mouth daily. 90 capsule 1  . Glycopyrrolate-Formoterol (BEVESPI AEROSPHERE) 9-4.8 MCG/ACT AERO Inhale 2 puffs into the lungs 2 (two) times daily. 10.7 g 5  . hydrochlorothiazide (HYDRODIURIL) 25 MG tablet Take 1 tablet (25 mg total) by mouth daily. 30 tablet 0  . meloxicam (MOBIC) 7.5 MG tablet Take 7.5 mg by mouth as needed for pain.    . metoprolol succinate (TOPROL-XL) 25 MG 24 hr tablet TAKE 1 TABLET BY MOUTH  DAILY. 90 tablet 3  . Multiple Vitamin (MULTIVITAMIN) tablet Take 1 tablet by mouth daily.      . nitroGLYCERIN (NITROSTAT) 0.4 MG SL tablet Place 0.4 mg under the tongue every 5 (five) minutes as needed for chest pain.    Marland Kitchen OLANZapine (ZYPREXA) 5 MG tablet Take 1 tablet (5 mg total) by mouth at bedtime. 90 tablet 1  . timolol (BETIMOL) 0.25 % ophthalmic solution Place 1-2 drops into both eyes daily.    . travoprost, benzalkonium, (TRAVATAN) 0.004 % ophthalmic solution Place  1 drop into both eyes at bedtime.     No current facility-administered medications on file prior to visit.    BP 130/70   Temp 99.1 F (37.3 C)   Wt 167 lb (75.8 kg)   BMI 26.95 kg/m  Objective:   Physical Exam Vitals and nursing note reviewed.  Constitutional:      Appearance: Normal appearance.  Musculoskeletal:        General: Normal range of motion.     Right lower leg: Edema present.     Left lower leg: Edema present.     Comments: Trace pitting edema BLE  Skin:    General: Skin is warm and dry.  Neurological:     General: No focal deficit present.     Mental Status: He is alert and oriented to person, place, and time.  Psychiatric:        Mood and Affect: Mood normal.        Behavior: Behavior normal.        Thought Content: Thought content normal.        Judgment: Judgment normal.       Assessment & Plan:  1. Lower extremity edema -Vast improvement over the last week.  Likely swelling due to Norvasc.  His labs were normal including BNP.  We will keep him on hydrochlorothiazide. - Basic Metabolic Panel - hydrochlorothiazide (HYDRODIURIL) 25 MG tablet; Take 1 tablet (25 mg total) by mouth daily.  Dispense: 90 tablet; Refill: 3  2. Essential hypertension -We will have him continue to monitor his blood pressure over the next 2 weeks and let me know via MyChart if his blood pressure continues to be elevated in the morning  Dorothyann Peng, NP

## 2020-05-01 NOTE — Patient Instructions (Signed)
It was great seeing you today   I am glad your swelling is better.   I have sent in a refill of HCTZ and will follow up with you regarding your blood work   Please let me know how your blood pressures are after the next 2 weeks

## 2020-05-02 LAB — BASIC METABOLIC PANEL
BUN: 18 mg/dL (ref 7–25)
CO2: 24 mmol/L (ref 20–32)
Calcium: 9.7 mg/dL (ref 8.6–10.3)
Chloride: 100 mmol/L (ref 98–110)
Creat: 0.86 mg/dL (ref 0.70–1.18)
Glucose, Bld: 105 mg/dL — ABNORMAL HIGH (ref 65–99)
Potassium: 4.1 mmol/L (ref 3.5–5.3)
Sodium: 138 mmol/L (ref 135–146)

## 2020-05-16 ENCOUNTER — Ambulatory Visit (INDEPENDENT_AMBULATORY_CARE_PROVIDER_SITE_OTHER): Payer: Medicare Other | Admitting: Psychiatry

## 2020-05-16 ENCOUNTER — Other Ambulatory Visit: Payer: Self-pay

## 2020-05-16 DIAGNOSIS — F319 Bipolar disorder, unspecified: Secondary | ICD-10-CM | POA: Diagnosis not present

## 2020-05-16 MED ORDER — FLUOXETINE HCL 20 MG PO CAPS: 20.0000 mg | ORAL_CAPSULE | Freq: Every day | ORAL | 1 refills | Status: DC

## 2020-05-16 MED ORDER — OLANZAPINE 5 MG PO TABS: 5.0000 mg | ORAL_TABLET | Freq: Every day | ORAL | 1 refills | Status: DC

## 2020-05-16 NOTE — Progress Notes (Signed)
Psychiatric Initial Adult Assessment   Patient Identification: Jeffrey Hudson MRN:  [id number] Date of Evaluation:  05/16/2020 Referral Source: Dr. Zettie Pho Chief Complaint:   Visit Diagnosis: Bipolar disorder  History of Present Illness:    Today the patient is doing well.  His mood is great.  He shows no evidence of depression or mania.  Over a decade ago he was started by a psychiatrist with Zyprexa and Prozac has done very well.  At this time there are efforts now that we should slowly take him off Zyprexa.  This was discussed on her last visit and I requested him last time to grieve his wife.  Unfortunately she could not get off of work.  The patient denies any depression or euphoria.  His biggest stressor is the fact that his best friend recently died.  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  The patient his wife is doing well.  She continues to work.  She is now working part-time.  The patient is handling the pandemic fairly well.  He has had both of his vaccines.  Overall his health is fairly good.  He does have coronary artery disease and has had stents.  He does have multiple cardiovascular risk factors.  He does not have diabetes but he has well-controlled hypertension, hypercholesterolemia and unfortunately he smokes.  The patient is sleeping and eating well.  He is got good energy.  He has a cat who is 11 years old.  Overall patient is very stable. Associated Signs/Symptoms: Depression Symptoms:  fatigue, (Hypo) Manic Symptoms:   Anxiety Symptoms:   Psychotic Symptoms:   PTSD Symptoms:   Past Psychiatric History: Zyprexa and Prozac, has been psychotherapy in the past including cognitive behavioral therapy.  Previous Psychotropic Medications: Zyprexa and Prozac  Substance Abuse History in the last 12 months:    Consequences of Substance Abuse:   Past Medical History:  Past Medical History:  Diagnosis Date   AMI (acute myocardial infarction) ([place])    Acute ST elevation   Anxiety     Bipolar disorder ([place])    COPD (chronic obstructive pulmonary disease) ([place])    Coronary artery disease    a. s/p anterolateral STEMI with BMS placed in large diagonal branch (2009).   Hyperlipidemia    Hypertension    Nodule of left lung    6-mm smooothly rounded nodule   Pre-diabetes    Syncope and collapse    in 2001 and 2004 with no clear cause   Tobacco abuse    Viral URI with cough 11/22/2016    Past Surgical History:  Procedure Laterality Date   stent placed in heart      Family Psychiatric History:   Family History:  Family History  Problem Relation Age of Onset   Dementia Mother 40       died   Alcohol abuse Father 31   Depression Brother     Social History:   Social History   Socioeconomic History   Marital status: Married    Spouse name: Not on file   Number of children: 1   Years of education: Not on file   Highest education level: Not on file  Occupational History   Occupation: Retired-Sales/broker  Tobacco Use   Smoking status: Current Every Day Smoker    Packs/day: 1.00    Years: 45.00    Pack years: 45.00    Types: Cigarettes   Smokeless tobacco: Never Used   Tobacco comment: 1 pack a day  Vaping Use  Vaping Use: Never used  Substance and Sexual Activity   Alcohol use: Yes    Alcohol/week: 1.0 standard drink    Types: 1 Standard drinks or equivalent per week    Comment: 2 glasses of wine daily   Drug use: No    Comment: marijuana quit 2010. He has hx of THC use.   Sexual activity: Not on file  Other Topics Concern   Not on file  Social History Narrative   He is not working   He has been married for 35 years    One child (son)       He likes to [employer] - plays once a week    Social Determinants of Radio broadcast assistant Strain:    Difficulty of Paying Living Expenses:   Food Insecurity:    Worried About Charity fundraiser in the Last Year:    Arboriculturist in the Last Year:   Transportation  Needs:    Film/video editor (Medical):    Lack of Transportation (Non-Medical):   Physical Activity:    Days of Exercise per Week:    Minutes of Exercise per Session:   Stress:    Feeling of Stress :   Social Connections:    Frequency of Communication with Friends and Family:    Frequency of Social Gatherings with Friends and Family:    Attends Religious Services:    Active Member of Clubs or Organizations:    Attends Archivist Meetings:    Marital Status:     Additional Social History:   Allergies:   Allergies  Allergen Reactions   Lisinopril Swelling   Norvasc [Amlodipine] Swelling    Lower extremity     Metabolic Disorder Labs: Lab Results  Component Value Date   HGBA1C 6.1 08/25/2018   MPG 131 06/24/2009   No results found for: PROLACTIN Lab Results  Component Value Date   CHOL 149 06/20/2019   TRIG 93 06/20/2019   HDL 66 06/20/2019   CHOLHDL 2.3 06/20/2019   VLDL 28.2 08/25/2018   LDLCALC 66 06/20/2019   LDLCALC 102 (H) 08/25/2018   Lab Results  Component Value Date   TSH 1.670 06/20/2019    Therapeutic Level Labs: No results found for: LITHIUM No results found for: CBMZ No results found for: VALPROATE  Current Medications: Current Outpatient Medications  Medication Sig Dispense Refill   albuterol (PROAIR HFA) 108 (90 Base) MCG/ACT inhaler Inhale 2 puffs into the lungs every 6 (six) hours as needed. 1 Inhaler 3   aspirin 81 MG tablet Take 81 mg by mouth daily.       atorvastatin (LIPITOR) 80 MG tablet Take 1 tablet (80 mg total) by mouth daily at 6 PM. 90 tablet 3   betamethasone dipropionate 0.05 % cream      ezetimibe (ZETIA) 10 MG tablet Take 1 tablet (10 mg total) by mouth daily. 90 tablet 3   FLUoxetine (PROZAC) 20 MG capsule Take 1 capsule (20 mg total) by mouth daily. 90 capsule 1   Glycopyrrolate-Formoterol (BEVESPI AEROSPHERE) 9-4.8 MCG/ACT AERO Inhale 2 puffs into the lungs 2 (two) times daily. 10.7 g 5    hydrochlorothiazide (HYDRODIURIL) 25 MG tablet Take 1 tablet (25 mg total) by mouth daily. 90 tablet 3   meloxicam (MOBIC) 7.5 MG tablet Take 7.5 mg by mouth as needed for pain.     metoprolol succinate (TOPROL-XL) 25 MG 24 hr tablet TAKE 1 TABLET BY MOUTH  DAILY. [place]  tablet 3   Multiple Vitamin (MULTIVITAMIN) tablet Take 1 tablet by mouth daily.       nitroGLYCERIN (NITROSTAT) 0.4 MG SL tablet Place 0.4 mg under the tongue every 5 (five) minutes as needed for chest pain.     OLANZapine (ZYPREXA) 5 MG tablet Take 1 tablet (5 mg total) by mouth at bedtime. 90 tablet 1   timolol (BETIMOL) 0.25 % ophthalmic solution Place 1-2 drops into both eyes daily.     travoprost, benzalkonium, (TRAVATAN) 0.004 % ophthalmic solution Place 1 drop into both eyes at bedtime.     No current facility-administered medications for this visit.    Musculoskeletal: Strength & Muscle Tone: within normal limits Gait & Station: normal Patient leans: N/A  Psychiatric Specialty Exam: ROS  There were no vitals taken for this visit.There is no height or weight on file to calculate BMI.  General Appearance: Casual  Eye Contact:  Good  Speech:  Clear and Coherent  Volume:  Normal  Mood:  Negative  Affect:  Appropriate  Thought Process:  Goal Directed  Orientation:  Full (Time, Place, and Person)  Thought Content:  WDL  Suicidal Thoughts:  No  Homicidal Thoughts:  No  Memory:  NA  Judgement:  Good  Insight:  NA and Good  Psychomotor Activity:  Normal  Concentration:    Recall:  Good  Fund of Knowledge:Good  Language: Good  Akathisia:  No  Handed:  Right  AIMS (if indicated):  not done  Assets:  Desire for Improvement  ADL's:  Intact  Cognition: WNL  Sleep:  Good   Screenings: PHQ2-9     Office Visit from 02/04/2015 in [place] at [place]  PHQ-2 Total Score 0      Assessment and Plan:   This patient carries a diagnosis of bipolar disorder.  It is clearly in remission.  Given  the fact that he has never been psychiatrically hospitalized and is not on disability for this condition and it is not any symptoms of bipolar disorder for over a decade it behooves [place] to start removing his Zyprexa.  I would have started to do it now but unfortunately his wife was not present.  Patient will return in 2-1/2 months with his wife and at that time we will likely reduce the Zyprexa down to 2.5 mg and plan to discontinue it over the following months.  Patient will continue taking Prozac as prescribed.  The patient has no vegetative symptoms.  Right now the pandemic is becoming an issue again and I am hesitant to make any changes without his wife with [place].  Also the fact that the patient lost his best friend is another factor that I am cautious to make any big changes.  I shared with him that I would like to continue the Prozac indefinitely and just try to taper and discontinue his Zyprexa. Jerral Ralph, MD 7/30/20219:38 AM

## 2020-05-25 ENCOUNTER — Encounter: Payer: Self-pay | Admitting: Adult Health

## 2020-05-26 ENCOUNTER — Encounter: Payer: Self-pay | Admitting: Adult Health

## 2020-05-26 ENCOUNTER — Other Ambulatory Visit: Payer: Self-pay | Admitting: Adult Health

## 2020-05-27 ENCOUNTER — Other Ambulatory Visit: Payer: Self-pay | Admitting: Adult Health

## 2020-05-27 MED ORDER — HYDROCHLOROTHIAZIDE 12.5 MG PO CAPS: 12.5000 mg | ORAL_CAPSULE | Freq: Two times a day (BID) | ORAL | 0 refills | Status: DC

## 2020-05-28 ENCOUNTER — Ambulatory Visit: Payer: Medicare Other | Admitting: Podiatry

## 2020-05-28 ENCOUNTER — Encounter: Payer: Self-pay | Admitting: Podiatry

## 2020-05-28 ENCOUNTER — Other Ambulatory Visit: Payer: Self-pay

## 2020-05-28 DIAGNOSIS — M79676 Pain in unspecified toe(s): Secondary | ICD-10-CM | POA: Diagnosis not present

## 2020-05-28 DIAGNOSIS — B351 Tinea unguium: Secondary | ICD-10-CM

## 2020-05-28 NOTE — Progress Notes (Signed)
This patient returns to the office for evaluation and treatment of long thick painful nails .  This patient is unable to trim his own nails since the patient cannot reach the feet.  Patient says the nails are painful walking and wearing his shoes.  He returns for preventive foot care services.  General Appearance  Alert, conversant and in no acute stress.  Vascular  Dorsalis pedis and posterior tibial  pulses are palpable  bilaterally.  Capillary return is within normal limits  bilaterally. Temperature is within normal limits  bilaterally.  Neurologic  Senn-Weinstein monofilament wire test within normal limits  bilaterally. Muscle power within normal limits bilaterally.  Nails Thick disfigured discolored nails with subungual debris  from hallux to fifth toes bilaterally. No evidence of bacterial infection or drainage bilaterally.  Orthopedic  No limitations of motion  feet .  No crepitus or effusions noted.  No bony pathology or digital deformities noted.  Skin  normotropic skin with no porokeratosis noted bilaterally.  No signs of infections or ulcers noted.     Onychomycosis  Pain in toes right foot  Pain in toes left foot  Debridement  of nails  1-5  B/L with a nail nipper.  Nails were then filed using a dremel tool with no incidents.    RTC 3 months    Adnan Vanvoorhis DPM  

## 2020-06-25 ENCOUNTER — Other Ambulatory Visit: Payer: Self-pay | Admitting: Physician Assistant

## 2020-07-15 ENCOUNTER — Telehealth: Payer: Self-pay | Admitting: Cardiovascular Disease

## 2020-07-15 MED ORDER — METOPROLOL SUCCINATE ER 25 MG PO TB24: ORAL_TABLET | ORAL | 0 refills | Status: DC

## 2020-07-15 MED ORDER — EZETIMIBE 10 MG PO TABS: 10.0000 mg | ORAL_TABLET | Freq: Every day | ORAL | 0 refills | Status: DC

## 2020-07-15 NOTE — Telephone Encounter (Signed)
° ° ° °*  STAT* If patient is at the pharmacy, call can be transferred to refill team.   1. Which medications need to be refilled? (please list name of each medication and dose if known)   ezetimibe (ZETIA) 10 MG tablet    metoprolol succinate (TOPROL-XL) 25 MG 24 hr tablet    2. Which pharmacy/location (including street and city if local pharmacy) is medication to be sent to? [facility], [place], [address]  3. Do they need a 30 day or 90 day supply? 90 days

## 2020-07-15 NOTE — Telephone Encounter (Signed)
Pt's medications were sent to pt's pharmacy as requested. Confirmation received.  

## 2020-07-16 ENCOUNTER — Telehealth: Payer: Self-pay | Admitting: Cardiovascular Disease

## 2020-07-16 NOTE — Telephone Encounter (Signed)
Called pharmacy to inform them that the pt's medication was sent to their pharmacy on 07/15/20 with a 90 day supply. Pharmacy found Rx the was sent. I advised the pharmacy tech that if they have any other problems, questions or concerns, to give our office a call back.

## 2020-07-16 NOTE — Telephone Encounter (Signed)
Pt c/o medication issue:  1. Name of Medication: metoprolol succinate (TOPROL-XL) 25 MG 24 hr tablet  2. How are you currently taking this medication (dosage and times per day)?   3. Are you having a reaction (difficulty breathing--STAT)?   4. What is your medication issue? Apolonio Schneiders from [facility] is calling about the refill they received on 06/26/20 for a 30 day supply, stating the patient is requesting a 90 day. She states it would not affect the cost for the patient. The reference number when calling back is [phone]. Please advise.

## 2020-07-23 ENCOUNTER — Other Ambulatory Visit: Payer: Self-pay | Admitting: Physician Assistant

## 2020-07-29 ENCOUNTER — Telehealth: Payer: Self-pay | Admitting: Pulmonary Disease

## 2020-07-29 NOTE — Telephone Encounter (Signed)
I have spoken with Jeffrey Hudson and his LCS CT has been scheduled on 08/08/2020 @ 9:30am at [place]

## 2020-07-30 NOTE — Progress Notes (Signed)
Cardiology Office Note    Date:  08/05/2020   ID:  Jeffrey Hudson, DOB 1951-09-09, MRN [id number]  PCP:  Dorothyann Peng, NP  Cardiologist: Lauree Chandler, MD EPS: None  Chief Complaint  Patient presents with  . Follow-up    History of Present Illness:  Jeffrey Hudson is a 69 y.o. male with CAD s/p anterolateral STEMI with BMS placed in large diagonal branch (2009),Last cath was in 01/2010 showing patent stent with 40-50% restenosis, 20% LAD in several locations, 50% Cx, 30% distal RCA, and 60% focal PLA , NST 2016 no ischemia, COPD, tobacco use, mild HTN, HLD, bipolar disorder, pre-DM.  Patient last had telemedicine visit with Melina Copa, PA-C 06/18/2019 was doing well without angina.  Patient comes in for f/u. No chest pain, dizziness, palpitations. Had swelling but improved when PCP stopped amlodipine. Having CT today for dyspnea on exertion and COPD. Still smoking 1/2 ppd. Having trouble with plantar fascitis. Was walking 5-6 blocks/30-45 min daily.   Past Medical History:  Diagnosis Date  . AMI (acute myocardial infarction) ([place])    Acute ST elevation  . Anxiety   . Bipolar disorder ([place])   . COPD (chronic obstructive pulmonary disease) ([place])   . Coronary artery disease    a. s/p anterolateral STEMI with BMS placed in large diagonal branch (2009).  Marland Kitchen Hyperlipidemia   . Hypertension   . Nodule of left lung    6-mm smooothly rounded nodule  . Pre-diabetes   . Syncope and collapse    in 2001 and 2004 with no clear cause  . Tobacco abuse   . Viral URI with cough 11/22/2016    Past Surgical History:  Procedure Laterality Date  . stent placed in heart      Current Medications: Current Meds  Medication Sig  . albuterol (PROAIR HFA) 108 (90 Base) MCG/ACT inhaler Inhale 2 puffs into the lungs every 6 (six) hours as needed.  Marland Kitchen aspirin 81 MG tablet Take 81 mg by mouth daily.    Marland Kitchen atorvastatin (LIPITOR) 80 MG tablet TAKE 1 TABLET BY MOUTH  DAILY AT 6 PM  .  betamethasone dipropionate 0.05 % cream as needed.   . ezetimibe (ZETIA) 10 MG tablet Take 1 tablet (10 mg total) by mouth daily. Please keep upcoming appt in October before anymore refills. Thank you  . FLUoxetine (PROZAC) 20 MG capsule Take 1 capsule (20 mg total) by mouth daily.  . Glycopyrrolate-Formoterol (BEVESPI AEROSPHERE) 9-4.8 MCG/ACT AERO Inhale 2 puffs into the lungs 2 (two) times daily. (Patient taking differently: Inhale 2 puffs into the lungs as needed. )  . hydrochlorothiazide (MICROZIDE) 12.5 MG capsule Take 1 capsule (12.5 mg total) by mouth 2 (two) times daily. (Patient taking differently: Take 12.5 mg by mouth daily. )  . meloxicam (MOBIC) 7.5 MG tablet Take 7.5 mg by mouth as needed for pain.  . metoprolol succinate (TOPROL-XL) 25 MG 24 hr tablet TAKE 1 TABLET BY MOUTH  DAILY. Please keep upcoming appt in October before anymore refills. Thank you  . Multiple Vitamin (MULTIVITAMIN) tablet Take 1 tablet by mouth daily.    . nitroGLYCERIN (NITROSTAT) 0.4 MG SL tablet Place 0.4 mg under the tongue every 5 (five) minutes as needed for chest pain.  Marland Kitchen OLANZapine (ZYPREXA) 5 MG tablet Take 1 tablet (5 mg total) by mouth at bedtime.  . timolol (BETIMOL) 0.25 % ophthalmic solution Place 1-2 drops into both eyes daily.  . travoprost, benzalkonium, (TRAVATAN) 0.004 % ophthalmic solution Place  1 drop into both eyes at bedtime.     Allergies:   Lisinopril and Norvasc [amlodipine]   Social History   Socioeconomic History  . Marital status: Married    Spouse name: Not on file  . Number of children: 1  . Years of education: Not on file  . Highest education level: Not on file  Occupational History  . Occupation: Retired-Sales/broker  Tobacco Use  . Smoking status: Current Every Day Smoker    Packs/day: 1.00    Years: 45.00    Pack years: 45.00    Types: Cigarettes  . Smokeless tobacco: Never Used  . Tobacco comment: 1 pack a day  Vaping Use  . Vaping Use: Never used  Substance  and Sexual Activity  . Alcohol use: Yes    Alcohol/week: 1.0 standard drink    Types: 1 Standard drinks or equivalent per week    Comment: 2 glasses of wine daily  . Drug use: No    Comment: marijuana quit 2010. He has hx of THC use.  Marland Kitchen Sexual activity: Not on file  Other Topics Concern  . Not on file  Social History Narrative   He is not working   He has been married for 35 years    One child (son)       He likes to [employer] - plays once a week    Social Determinants of Radio broadcast assistant Strain:   . Difficulty of Paying Living Expenses: Not on file  Food Insecurity:   . Worried About Charity fundraiser in the Last Year: Not on file  . Ran Out of Food in the Last Year: Not on file  Transportation Needs:   . Lack of Transportation (Medical): Not on file  . Lack of Transportation (Non-Medical): Not on file  Physical Activity:   . Days of Exercise per Week: Not on file  . Minutes of Exercise per Session: Not on file  Stress:   . Feeling of Stress : Not on file  Social Connections:   . Frequency of Communication with Friends and Family: Not on file  . Frequency of Social Gatherings with Friends and Family: Not on file  . Attends Religious Services: Not on file  . Active Member of Clubs or Organizations: Not on file  . Attends Archivist Meetings: Not on file  . Marital Status: Not on file     Family History:  The patient's family history includes Alcohol abuse (age of onset: 8) in his father; Dementia (age of onset: 48) in his mother; Depression in his brother.   ROS:   Please see the history of present illness.    ROS All other systems reviewed and are negative.   PHYSICAL EXAM:   VS:  BP 130/80   Pulse 69   Ht 5\' 6"  (6.578 m)   Wt 170 lb (77.1 kg)   SpO2 97%   BMI 27.44 kg/m   Physical Exam  GEN: Well nourished, well developed, in no acute distress  Neck: no JVD, carotid bruits, or masses Cardiac:RRR; no murmurs, rubs, or gallops    Respiratory: Decreased breath sounds but clear GI: soft, nontender, nondistended, + BS Ext: without cyanosis, clubbing, or edema, Good distal pulses bilaterally Neuro:  Alert and Oriented x 3 Psych: euthymic mood, full affect  Wt Readings from Last 3 Encounters:  08/05/20 170 lb (77.1 kg)  05/01/20 167 lb (75.8 kg)  04/23/20 171 lb (77.6 kg)  Studies/Labs Reviewed:   EKG:  EKG is  ordered today.  The ekg ordered today demonstrates NSR no change.  Recent Labs: 04/23/2020: Pro B Natriuretic peptide (BNP) 77.0 05/01/2020: BUN 18; Creat 0.86; Potassium 4.1; Sodium 138   Lipid Panel    Component Value Date/Time   CHOL 149 06/20/2019 1535   TRIG 93 06/20/2019 1535   HDL 66 06/20/2019 1535   CHOLHDL 2.3 06/20/2019 1535   CHOLHDL 3 08/25/2018 0751   VLDL 28.2 08/25/2018 0751   LDLCALC 66 06/20/2019 1535    Additional studies/ records that were reviewed today include:      ASSESSMENT:    1. Coronary artery disease involving native coronary artery of native heart without angina pectoris   2. Essential hypertension   3. Hyperlipidemia, unspecified hyperlipidemia type   4. Tobacco abuse      PLAN:  In order of problems listed above:  CAD s/p anterolateral STEMI with BMS placed in large diagonal branch (2009),Last cath was in 01/2010 showing patent stent with 40-50% restenosis, 20% LAD in several llocations, 50% Cx, 30% distal RCA, and 60% focal PLA    Hypertension BP controlled on Toprol  HLD on lipitor and zetia.  LDL 66 06/1999 needs FLP will check today  Tobacco abuse still smoking 1/2 ppd smoking cessation discussed. For annual CT today.   Medication Adjustments/Labs and Tests Ordered: Current medicines are reviewed at length with the patient today.  Concerns regarding medicines are outlined above.  Medication changes, Labs and Tests ordered today are listed in the Patient Instructions below. Patient Instructions  Medication Instructions:  Your physician  recommends that you continue on your current medications as directed. Please refer to the Current Medication list given to you today.  *If you need a refill on your cardiac medications before your next appointment, please call your pharmacy*   Lab Work: TODAY: CMET, CBC, FLP  If you have labs (blood work) drawn today and your tests are completely normal, you will receive your results only by: Marland Kitchen MyChart Message (if you have MyChart) OR . A paper copy in the mail If you have any lab test that is abnormal or we need to change your treatment, we will call you to review the results.   Testing/Procedures: None   Follow-Up: At [facility], you and your health needs are our priority.  As part of our continuing mission to provide you with exceptional heart care, we have created designated Provider Care Teams.  These Care Teams include your primary Cardiologist (physician) and Advanced Practice Providers (APPs -  Physician Assistants and Nurse Practitioners) who all work together to provide you with the care you need, when you need it.  We recommend signing up for the patient portal called "MyChart".  Sign up information is provided on this After Visit Summary.  MyChart is used to connect with patients for Virtual Visits (Telemedicine).  Patients are able to view lab/test results, encounter notes, upcoming appointments, etc.  Non-urgent messages can be sent to your provider as well.   To learn more about what you can do with MyChart, go to [email or web address].    Your next appointment:   1 year(s)  The format for your next appointment:   In Person  Provider:   You may see Lauree Chandler, MD or one of the following Advanced Practice Providers on your designated Care Team:    Melina Copa, PA-C  Ermalinda Barrios, PA-C       Signed, Ermalinda Barrios, PA-C  08/05/2020  8:41 AM    [facility] [address], [place], [place]  [address] Phone: [phone];  Fax: [phone]

## 2020-08-05 ENCOUNTER — Ambulatory Visit (INDEPENDENT_AMBULATORY_CARE_PROVIDER_SITE_OTHER)
Admission: RE | Admit: 2020-08-05 | Discharge: 2020-08-05 | Disposition: A | Discharge status: Home / Self Care | Payer: Medicare Other | Source: Ambulatory Visit | Attending: Acute Care | Admitting: Acute Care

## 2020-08-05 ENCOUNTER — Ambulatory Visit: Payer: Medicare Other | Admitting: Physician Assistant

## 2020-08-05 ENCOUNTER — Encounter: Payer: Self-pay | Admitting: Physician Assistant

## 2020-08-05 ENCOUNTER — Other Ambulatory Visit: Payer: Self-pay

## 2020-08-05 VITALS — BP 130/80 | HR 69 | Ht 66.0 in | Wt 170.0 lb

## 2020-08-05 DIAGNOSIS — I10 Essential (primary) hypertension: Secondary | ICD-10-CM

## 2020-08-05 DIAGNOSIS — Z87891 Personal history of nicotine dependence: Secondary | ICD-10-CM

## 2020-08-05 DIAGNOSIS — Z720 Tobacco use: Secondary | ICD-10-CM | POA: Diagnosis not present

## 2020-08-05 DIAGNOSIS — Z122 Encounter for screening for malignant neoplasm of respiratory organs: Secondary | ICD-10-CM

## 2020-08-05 DIAGNOSIS — F17210 Nicotine dependence, cigarettes, uncomplicated: Secondary | ICD-10-CM

## 2020-08-05 DIAGNOSIS — E785 Hyperlipidemia, unspecified: Secondary | ICD-10-CM

## 2020-08-05 DIAGNOSIS — I2510 Atherosclerotic heart disease of native coronary artery without angina pectoris: Secondary | ICD-10-CM

## 2020-08-05 LAB — CBC

## 2020-08-05 MED ORDER — HYDROCHLOROTHIAZIDE 12.5 MG PO CAPS: 12.5000 mg | ORAL_CAPSULE | Freq: Two times a day (BID) | ORAL | 3 refills | Status: DC

## 2020-08-05 MED ORDER — EZETIMIBE 10 MG PO TABS: 10.0000 mg | ORAL_TABLET | Freq: Every day | ORAL | 3 refills | Status: DC

## 2020-08-05 MED ORDER — ATORVASTATIN CALCIUM 80 MG PO TABS: ORAL_TABLET | ORAL | 3 refills | Status: DC

## 2020-08-05 MED ORDER — METOPROLOL SUCCINATE ER 25 MG PO TB24: ORAL_TABLET | ORAL | 3 refills | Status: DC

## 2020-08-05 NOTE — Addendum Note (Signed)
Addended by: Carylon Perches on: 08/05/2020 09:16 AM   Modules accepted: Orders

## 2020-08-05 NOTE — Patient Instructions (Signed)
Medication Instructions:  Your physician recommends that you continue on your current medications as directed. Please refer to the Current Medication list given to you today.  *If you need a refill on your cardiac medications before your next appointment, please call your pharmacy*   Lab Work: TODAY: CMET, CBC, FLP  If you have labs (blood work) drawn today and your tests are completely normal, you will receive your results only by:  [place] (if you have MyChart) OR  A paper copy in the mail If you have any lab test that is abnormal or we need to change your treatment, we will call you to review the results.   Testing/Procedures: None   Follow-Up: At [facility], you and your health needs are our priority.  As part of our continuing mission to provide you with exceptional heart care, we have created designated Provider Care Teams.  These Care Teams include your primary Cardiologist (physician) and Advanced Practice Providers (APPs -  Physician Assistants and Nurse Practitioners) who all work together to provide you with the care you need, when you need it.  We recommend signing up for the patient portal called "MyChart".  Sign up information is provided on this After Visit Summary.  MyChart is used to connect with patients for Virtual Visits (Telemedicine).  Patients are able to view lab/test results, encounter notes, upcoming appointments, etc.  Non-urgent messages can be sent to your provider as well.   To learn more about what you can do with MyChart, go to [email or web address].    Your next appointment:   1 year(s)  The format for your next appointment:   In Person  Provider:   You may see Lauree Chandler, MD or one of the following Advanced Practice Providers on your designated Care Team:    Melina Copa, PA-C  Ermalinda Barrios, PA-C

## 2020-08-06 ENCOUNTER — Telehealth: Payer: Self-pay | Admitting: Physician Assistant

## 2020-08-06 LAB — COMPREHENSIVE METABOLIC PANEL
ALT: 38 IU/L (ref 0–44)
AST: 45 IU/L — ABNORMAL HIGH (ref 0–40)
Albumin/Globulin Ratio: 2.3 — ABNORMAL HIGH (ref 1.2–2.2)
Albumin: 4.4 g/dL (ref 3.8–4.8)
Alkaline Phosphatase: 122 IU/L — ABNORMAL HIGH (ref 44–121)
BUN/Creatinine Ratio: 18 (ref 10–24)
BUN: 17 mg/dL (ref 8–27)
Bilirubin Total: 1 mg/dL (ref 0.0–1.2)
CO2: 25 mmol/L (ref 20–29)
Calcium: 9.7 mg/dL (ref 8.6–10.2)
Chloride: 100 mmol/L (ref 96–106)
Creatinine, Ser: 0.93 mg/dL (ref 0.76–1.27)
GFR calc Af Amer: 96 mL/min/{1.73_m2} (ref >59)
GFR calc non Af Amer: 83 mL/min/{1.73_m2} (ref >59)
Globulin, Total: 1.9 g/dL (ref 1.5–4.5)
Glucose: 117 mg/dL — ABNORMAL HIGH (ref 65–99)
Potassium: 4.4 mmol/L (ref 3.5–5.2)
Sodium: 139 mmol/L (ref 134–144)
Total Protein: 6.3 g/dL (ref 6.0–8.5)

## 2020-08-06 LAB — LIPID PANEL
Chol/HDL Ratio: 2.5 ratio (ref 0.0–5.0)
Cholesterol, Total: 163 mg/dL (ref 100–199)
HDL: 66 mg/dL (ref >39)
LDL Chol Calc (NIH): 71 mg/dL (ref 0–99)
Triglycerides: 153 mg/dL — ABNORMAL HIGH (ref 0–149)
VLDL Cholesterol Cal: 26 mg/dL (ref 5–40)

## 2020-08-06 LAB — CBC
Hematocrit: 41.9 % (ref 37.5–51.0)
Hemoglobin: 14.6 g/dL (ref 13.0–17.7)
MCH: 33 pg (ref 26.6–33.0)
MCHC: 34.8 g/dL (ref 31.5–35.7)
MCV: 95 fL (ref 79–97)
Platelets: 133 10*3/uL — ABNORMAL LOW (ref 150–450)
RBC: 4.43 x10E6/uL (ref 4.14–5.80)
RDW: 12.6 % (ref 11.6–15.4)
WBC: 7 10*3/uL (ref 3.4–10.8)

## 2020-08-06 NOTE — Telephone Encounter (Signed)
Spoke to pt, gave him results.   Triglycerides minimally elevated 153-cut back on sweets and sugars. Minimal increase of AST-ask if drinking alcohol regularly or tylenol and ask him to cut back. Glucose up 117 and platelets low 133 f/u with PCP

## 2020-08-06 NOTE — Telephone Encounter (Signed)
° °  Pt is returning call to get lab result °

## 2020-08-07 NOTE — Progress Notes (Signed)
Please call patient and let them  know their  low dose Ct was read as a Lung RADS 2: nodules that are benign in appearance and behavior with a very low likelihood of becoming a clinically active cancer due to size or lack of growth. Recommendation per radiology is for a repeat LDCT in 12 months. .Please let them  know we will order and schedule their  annual screening scan for 07/2021. Please let them  know there was notation of CAD on their  scan.  Please remind the patient  that this is a non-gated exam therefore degree or severity of disease  cannot be determined. Please have them  follow up with their PCP regarding potential risk factor modification, dietary therapy or pharmacologic therapy if clinically indicated. Pt.  is  currently on statin therapy. Please place order for annual  screening scan for  07/2021 and fax results to PCP. Thanks so much.  Pt. Has significant CAD. He is on statins, have him follow up with cardiology Additionally>>> He has notation of Hepatic steatosis. This  is a term that describes the build up of fat in the liver. It is normal to have small amounts of fat in your liver, but when the proportion of liver cells that contain fat exceeds more than 5% it is indicative of early stage fatty liver.Treatment often involves reducing risk factors through a diet and exercise plan. It is generally a benign condition, but in a small percentage of patients it does require follow up. Please have the patient follow up with PCP regarding potential risk factor modification, dietary therapy or pharmacologic therapy if clinically indicated.  Thanks so much

## 2020-08-08 ENCOUNTER — Inpatient Hospital Stay: Admission: RE | Admit: 2020-08-08 | Payer: Medicare Other | Source: Ambulatory Visit

## 2020-08-11 ENCOUNTER — Other Ambulatory Visit: Payer: Self-pay | Admitting: *Deleted

## 2020-08-11 DIAGNOSIS — Z87891 Personal history of nicotine dependence: Secondary | ICD-10-CM

## 2020-08-11 DIAGNOSIS — F17210 Nicotine dependence, cigarettes, uncomplicated: Secondary | ICD-10-CM

## 2020-08-15 ENCOUNTER — Other Ambulatory Visit: Payer: Self-pay

## 2020-08-15 ENCOUNTER — Ambulatory Visit (INDEPENDENT_AMBULATORY_CARE_PROVIDER_SITE_OTHER): Payer: Medicare Other | Admitting: Psychiatry

## 2020-08-15 DIAGNOSIS — F3113 Bipolar disorder, current episode manic without psychotic features, severe: Secondary | ICD-10-CM

## 2020-08-15 MED ORDER — OLANZAPINE 2.5 MG PO TABS: ORAL_TABLET | ORAL | 3 refills | Status: DC

## 2020-08-15 MED ORDER — FLUOXETINE HCL 20 MG PO CAPS: 20.0000 mg | ORAL_CAPSULE | Freq: Every day | ORAL | 1 refills | Status: DC

## 2020-08-15 NOTE — Progress Notes (Signed)
Psychiatric Initial Adult Assessment   Patient Identification: Jeffrey Hudson MRN:  [id number] Date of Evaluation:  08/15/2020 Referral Source: Dr. Zettie Pho Chief Complaint:   Visit Diagnosis: Bipolar disorder  History of Present Illness:    Today the patient is doing well.  He is at his baseline.  Unfortunately once again his wife could not come for the session.  In a quick review approximately 15 years ago he presented to a psychiatrist, Dr. Laren Everts with a presentation of mania.  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  He described himself as being euphoric.  But more than that he had super amounts of energy he spoke really quickly and his self-esteem was inflated.  He never was delusional or grandiose but his self-esteem was inflated.  He also did some risky dangerous activity including buying a boat without talking to his wife.  His concentration was good and he was sleeping okay but it clearly appeared manic in this presentation.  He was started on Zyprexa and Prozac.  Noted the patient does have cardiovascular risk factors.  He actually has coronary artery disease and has stents.  He has hypercholesterolemia hypertension and he smokes cigarettes at this time.  Therefore over the last year we have slowly reduced his Zyprexa now down to 5 mg.  He has never ever had any symptoms.  In fact has not had any symptoms since he started this medicine 15 years ago.  His wife is very much aware that we are slowly reducing the plan to discontinue his Zyprexa.  She agrees with it.  She has no problems with it nor does the patient.  The patient wishes to come off the medicine.  The patient drinks no alcohol uses no drugs.  He demonstrates no psychosis.  His mood is even.  He is sleeping and eating well.  He is very stable. Associated Signs/Symptoms: Depression Symptoms:  fatigue, (Hypo) Manic Symptoms:   Anxiety Symptoms:   Psychotic Symptoms:   PTSD Symptoms:   Past  Psychiatric History: Zyprexa and Prozac, has been psychotherapy in the past including cognitive behavioral therapy.  Previous Psychotropic Medications: Zyprexa and Prozac  Substance Abuse History in the last 12 months:    Consequences of Substance Abuse:   Past Medical History:  Past Medical History:  Diagnosis Date  . AMI (acute myocardial infarction) ([place])    Acute ST elevation  . Anxiety   . Bipolar disorder ([place])   . COPD (chronic obstructive pulmonary disease) ([place])   . Coronary artery disease    a. s/p anterolateral STEMI with BMS placed in large diagonal branch (2009).  Marland Kitchen Hyperlipidemia   . Hypertension   . Nodule of left lung    6-mm smooothly rounded nodule  . Pre-diabetes   . Syncope and collapse    in 2001 and 2004 with no clear cause  . Tobacco abuse   . Viral URI with cough 11/22/2016    Past Surgical History:  Procedure Laterality Date  . stent placed in heart      Family Psychiatric History:   Family History:  Family History  Problem Relation Age of Onset  . Dementia Mother 54       died  . Alcohol abuse Father 57  . Depression Brother     Social History:   Social History   Socioeconomic History  . Marital status: Married    Spouse name: Not on file  . Number of children: 1  .  Years of education: Not on file  . Highest education level: Not on file  Occupational History  . Occupation: Retired-Sales/broker  Tobacco Use  . Smoking status: Current Every Day Smoker    Packs/day: 1.00    Years: 45.00    Pack years: 45.00    Types: Cigarettes  . Smokeless tobacco: Never Used  . Tobacco comment: 1 pack a day  Vaping Use  . Vaping Use: Never used  Substance and Sexual Activity  . Alcohol use: Yes    Alcohol/week: 1.0 standard drink    Types: 1 Standard drinks or equivalent per week    Comment: 2 glasses of wine daily  . Drug use: No    Comment: marijuana quit 2010. He has hx of THC use.  Marland Kitchen Sexual activity: Not on file  Other Topics Concern   . Not on file  Social History Narrative   He is not working   He has been married for 35 years    One child (son)       He likes to [employer] - plays once a week    Social Determinants of Radio broadcast assistant Strain:   . Difficulty of Paying Living Expenses: Not on file  Food Insecurity:   . Worried About Charity fundraiser in the Last Year: Not on file  . Ran Out of Food in the Last Year: Not on file  Transportation Needs:   . Lack of Transportation (Medical): Not on file  . Lack of Transportation (Non-Medical): Not on file  Physical Activity:   . Days of Exercise per Week: Not on file  . Minutes of Exercise per Session: Not on file  Stress:   . Feeling of Stress : Not on file  Social Connections:   . Frequency of Communication with Friends and Family: Not on file  . Frequency of Social Gatherings with Friends and Family: Not on file  . Attends Religious Services: Not on file  . Active Member of Clubs or Organizations: Not on file  . Attends Archivist Meetings: Not on file  . Marital Status: Not on file    Additional Social History:   Allergies:   Allergies  Allergen Reactions  . Lisinopril Swelling    Angio edema   . Norvasc [Amlodipine] Swelling    Lower extremity     Metabolic Disorder Labs: Lab Results  Component Value Date   HGBA1C 6.1 08/25/2018   MPG 131 06/24/2009   No results found for: PROLACTIN Lab Results  Component Value Date   CHOL 163 08/05/2020   TRIG 153 (H) 08/05/2020   HDL 66 08/05/2020   CHOLHDL 2.5 08/05/2020   VLDL 28.2 08/25/2018   LDLCALC 71 08/05/2020   LDLCALC 66 06/20/2019   Lab Results  Component Value Date   TSH 1.670 06/20/2019    Therapeutic Level Labs: No results found for: LITHIUM No results found for: CBMZ No results found for: VALPROATE  Current Medications: Current Outpatient Medications  Medication Sig Dispense Refill  . albuterol (PROAIR HFA) 108 (90 Base) MCG/ACT inhaler Inhale 2 puffs  into the lungs every 6 (six) hours as needed. 1 Inhaler 3  . aspirin 81 MG tablet Take 81 mg by mouth daily.      Marland Kitchen atorvastatin (LIPITOR) 80 MG tablet TAKE 1 TABLET BY MOUTH  DAILY AT 6 PM 90 tablet 3  . betamethasone dipropionate 0.05 % cream as needed.     . ezetimibe (ZETIA) 10 MG tablet Take  1 tablet (10 mg total) by mouth daily. 90 tablet 3  . FLUoxetine (PROZAC) 20 MG capsule Take 1 capsule (20 mg total) by mouth daily. 90 capsule 1  . Glycopyrrolate-Formoterol (BEVESPI AEROSPHERE) 9-4.8 MCG/ACT AERO Inhale 2 puffs into the lungs 2 (two) times daily. (Patient taking differently: Inhale 2 puffs into the lungs as needed. ) 10.7 g 5  . hydrochlorothiazide (MICROZIDE) 12.5 MG capsule Take 1 capsule (12.5 mg total) by mouth 2 (two) times daily. 180 capsule 3  . meloxicam (MOBIC) 7.5 MG tablet Take 7.5 mg by mouth as needed for pain.    . metoprolol succinate (TOPROL-XL) 25 MG 24 hr tablet TAKE 1 TABLET BY MOUTH  DAILY. 90 tablet 3  . Multiple Vitamin (MULTIVITAMIN) tablet Take 1 tablet by mouth daily.      . nitroGLYCERIN (NITROSTAT) 0.4 MG SL tablet Place 0.4 mg under the tongue every 5 (five) minutes as needed for chest pain.    Marland Kitchen OLANZapine (ZYPREXA) 2.5 MG tablet 1  qhs  For 1 month then DC it 30 tablet 3  . timolol (BETIMOL) 0.25 % ophthalmic solution Place 1-2 drops into both eyes daily.    . travoprost, benzalkonium, (TRAVATAN) 0.004 % ophthalmic solution Place 1 drop into both eyes at bedtime.     No current facility-administered medications for this visit.    Musculoskeletal: Strength & Muscle Tone: within normal limits Gait & Station: normal Patient leans: N/A  Psychiatric Specialty Exam: ROS  There were no vitals taken for this visit.There is no height or weight on file to calculate BMI.  General Appearance: Casual  Eye Contact:  Good  Speech:  Clear and Coherent  Volume:  Normal  Mood:  Negative  Affect:  Appropriate  Thought Process:  Goal Directed  Orientation:  Full  (Time, Place, and Person)  Thought Content:  WDL  Suicidal Thoughts:  No  Homicidal Thoughts:  No  Memory:  NA  Judgement:  Good  Insight:  NA and Good  Psychomotor Activity:  Normal  Concentration:    Recall:  Good  Fund of Knowledge:Good  Language: Good  Akathisia:  No  Handed:  Right  AIMS (if indicated):  not done  Assets:  Desire for Improvement  ADL's:  Intact  Cognition: WNL  Sleep:  Good   Screenings: PHQ2-9     Office Visit from 02/04/2015 in [facility] [place]  PHQ-2 Total Score 0      Assessment and Plan:   10/29/20219:38 AM   At this time this patient's diagnosis is bipolar disorder in remission.  Given his multiple cardiovascular risk factors and the fact that is been stable for well over a decade, that he is never been psychiatrically hospitalized or ever been suicidal it is reasonable now to reduce the Zyprexa to 2.5 mg for 1 month and then for him to stop it if he notes no differences.  He will see me a month later which will be in 2 months from now.  At that time will be on only Prozac AND Zyprexa.  He was told multiple times if things change and symptoms recur in any way to contact us immediately and we will go back to the Zyprexa.  He will share this with his wife as well.  The patient is very stable and very wishing and willing to come off the Zyprexa.  For now we will continue on Prozac as prescribed

## 2020-09-03 ENCOUNTER — Ambulatory Visit: Payer: Medicare Other | Admitting: Podiatry

## 2020-09-03 ENCOUNTER — Encounter: Payer: Self-pay | Admitting: Podiatry

## 2020-09-03 ENCOUNTER — Other Ambulatory Visit: Payer: Self-pay

## 2020-09-03 DIAGNOSIS — B351 Tinea unguium: Secondary | ICD-10-CM | POA: Diagnosis not present

## 2020-09-03 DIAGNOSIS — M79676 Pain in unspecified toe(s): Secondary | ICD-10-CM | POA: Diagnosis not present

## 2020-09-03 DIAGNOSIS — M722 Plantar fascial fibromatosis: Secondary | ICD-10-CM | POA: Diagnosis not present

## 2020-09-03 NOTE — Progress Notes (Signed)
This patient returns to the office for evaluation and treatment of long thick painful nails .  This patient is unable to trim his own nails since the patient cannot reach the feet.  Patient says the nails are painful walking and wearing his shoes.Patient says he has self diagnosed himself with plantar fasciitis due to heel pain especially right foot.  He returns for preventive foot care services.  General Appearance  Alert, conversant and in no acute stress.  Vascular  Dorsalis pedis and posterior tibial  pulses are palpable  bilaterally.  Capillary return is within normal limits  bilaterally. Temperature is within normal limits  bilaterally.  Neurologic  Senn-Weinstein monofilament wire test within normal limits  bilaterally. Muscle power within normal limits bilaterally.  Nails Thick disfigured discolored nails with subungual debris  from hallux to fifth toes bilaterally. No evidence of bacterial infection or drainage bilaterally.  Orthopedic  No limitations of motion  feet .  No crepitus or effusions noted.  No bony pathology or digital deformities noted. Palpable pain right heel.  Skin  normotropic skin with no porokeratosis noted bilaterally.  No signs of infections or ulcers noted.     Onychomycosis  Pain in toes right foot  Pain in toes left foot  Plantar fasciitis right heel  Debridement  of nails  1-5  B/L with a nail nipper.  Nails were then filed using a dremel tool with no incidents.  Prescribe plantar fascial brace large.  If heel pain persists he needs an appointment with Dr.  Paulla Dolly.  RTC  3 months    Gardiner Barefoot DPM

## 2020-09-08 DIAGNOSIS — H401131 Primary open-angle glaucoma, bilateral, mild stage: Secondary | ICD-10-CM | POA: Diagnosis not present

## 2020-09-19 ENCOUNTER — Other Ambulatory Visit: Payer: Self-pay

## 2020-09-19 ENCOUNTER — Telehealth (INDEPENDENT_AMBULATORY_CARE_PROVIDER_SITE_OTHER): Payer: Medicare Other | Admitting: Psychiatry

## 2020-09-19 DIAGNOSIS — F319 Bipolar disorder, unspecified: Secondary | ICD-10-CM

## 2020-09-19 MED ORDER — FLUOXETINE HCL 20 MG PO CAPS: 20.0000 mg | ORAL_CAPSULE | Freq: Every day | ORAL | 1 refills | Status: DC

## 2020-09-19 NOTE — Progress Notes (Signed)
Psychiatric Initial Adult Assessment   Patient Identification: Jeffrey Hudson MRN:  [id number] Date of Evaluation:  09/19/2020 Referral Source: Dr. Zettie Pho Chief Complaint:   Visit Diagnosis: Bipolar disorder  History of Present Illness:    The patient is doing well.  He has reduced his Zyprexa down to 2.5 but he apparently misunderstood and did not completely stop it.  He still taking 2.5.  He is very willing and anxious to go ahead and stopping which I have asked him to do today.  He is not showing any evidence of psychosis or mania.  He drinks no alcohol uses no drugs.  He is sleeping and eating well.  He enjoyed Thanksgiving.  Went to [place] to see his family.  He is looking forward to Christmas.  He says his wife is doing great.  The patient feels no difference with the lower dose of Zyprexa and I suspect will no problems just stopping it.  We will discontinue it today but he will continue taking Prozac as ordered.  Patient is functioning very well. Associated Signs/Symptoms: Depression Symptoms:  fatigue, (Hypo) Manic Symptoms:   Anxiety Symptoms:   Psychotic Symptoms:   PTSD Symptoms:   Past Psychiatric History: Zyprexa and Prozac, has been psychotherapy in the past including cognitive behavioral therapy.  Previous Psychotropic Medications: Zyprexa and Prozac  Substance Abuse History in the last 12 months:    Consequences of Substance Abuse:   Past Medical History:  Past Medical History:  Diagnosis Date  . AMI (acute myocardial infarction) ([place])    Acute ST elevation  . Anxiety   . Bipolar disorder ([place])   . COPD (chronic obstructive pulmonary disease) ([place])   . Coronary artery disease    a. s/p anterolateral STEMI with BMS placed in large diagonal branch (2009).  Marland Kitchen Hyperlipidemia   . Hypertension   . Nodule of left lung    6-mm smooothly rounded nodule  . Pre-diabetes   . Syncope and collapse    in 2001 and 2004 with no clear cause  . Tobacco abuse   .  Viral URI with cough 11/22/2016    Past Surgical History:  Procedure Laterality Date  . stent placed in heart      Family Psychiatric History:   Family History:  Family History  Problem Relation Age of Onset  . Dementia Mother 50       died  . Alcohol abuse Father 60  . Depression Brother     Social History:   Social History   Socioeconomic History  . Marital status: Married    Spouse name: Not on file  . Number of children: 1  . Years of education: Not on file  . Highest education level: Not on file  Occupational History  . Occupation: Retired-Sales/broker  Tobacco Use  . Smoking status: Current Every Day Smoker    Packs/day: 1.00    Years: 45.00    Pack years: 45.00    Types: Cigarettes  . Smokeless tobacco: Never Used  . Tobacco comment: 1 pack a day  Vaping Use  . Vaping Use: Never used  Substance and Sexual Activity  . Alcohol use: Yes    Alcohol/week: 1.0 standard drink    Types: 1 Standard drinks or equivalent per week    Comment: 2 glasses of wine daily  . Drug use: No    Comment: marijuana quit 2010. He has hx of THC use.  Marland Kitchen Sexual activity: Not on file  Other Topics Concern  .  Not on file  Social History Narrative   He is not working   He has been married for 35 years    One child (son)       He likes to [employer] - plays once a week    Social Determinants of Radio broadcast assistant Strain:   . Difficulty of Paying Living Expenses: Not on file  Food Insecurity:   . Worried About Charity fundraiser in the Last Year: Not on file  . Ran Out of Food in the Last Year: Not on file  Transportation Needs:   . Lack of Transportation (Medical): Not on file  . Lack of Transportation (Non-Medical): Not on file  Physical Activity:   . Days of Exercise per Week: Not on file  . Minutes of Exercise per Session: Not on file  Stress:   . Feeling of Stress : Not on file  Social Connections:   . Frequency of Communication with Friends and Family: Not on  file  . Frequency of Social Gatherings with Friends and Family: Not on file  . Attends Religious Services: Not on file  . Active Member of Clubs or Organizations: Not on file  . Attends Archivist Meetings: Not on file  . Marital Status: Not on file    Additional Social History:   Allergies:   Allergies  Allergen Reactions  . Lisinopril Swelling    Angio edema   . Norvasc [Amlodipine] Swelling    Lower extremity     Metabolic Disorder Labs: Lab Results  Component Value Date   HGBA1C 6.1 08/25/2018   MPG 131 06/24/2009   No results found for: PROLACTIN Lab Results  Component Value Date   CHOL 163 08/05/2020   TRIG 153 (H) 08/05/2020   HDL 66 08/05/2020   CHOLHDL 2.5 08/05/2020   VLDL 28.2 08/25/2018   LDLCALC 71 08/05/2020   LDLCALC 66 06/20/2019   Lab Results  Component Value Date   TSH 1.670 06/20/2019    Therapeutic Level Labs: No results found for: LITHIUM No results found for: CBMZ No results found for: VALPROATE  Current Medications: Current Outpatient Medications  Medication Sig Dispense Refill  . albuterol (PROAIR HFA) 108 (90 Base) MCG/ACT inhaler Inhale 2 puffs into the lungs every 6 (six) hours as needed. 1 Inhaler 3  . aspirin 81 MG tablet Take 81 mg by mouth daily.      Marland Kitchen atorvastatin (LIPITOR) 80 MG tablet TAKE 1 TABLET BY MOUTH  DAILY AT 6 PM 90 tablet 3  . betamethasone dipropionate 0.05 % cream as needed.     . ezetimibe (ZETIA) 10 MG tablet Take 1 tablet (10 mg total) by mouth daily. 90 tablet 3  . FLUoxetine (PROZAC) 20 MG capsule Take 1 capsule (20 mg total) by mouth daily. 90 capsule 1  . Glycopyrrolate-Formoterol (BEVESPI AEROSPHERE) 9-4.8 MCG/ACT AERO Inhale 2 puffs into the lungs 2 (two) times daily. (Patient taking differently: Inhale 2 puffs into the lungs as needed. ) 10.7 g 5  . hydrochlorothiazide (MICROZIDE) 12.5 MG capsule Take 1 capsule (12.5 mg total) by mouth 2 (two) times daily. 180 capsule 3  . meloxicam (MOBIC) 7.5  MG tablet Take 7.5 mg by mouth as needed for pain.    . metoprolol succinate (TOPROL-XL) 25 MG 24 hr tablet TAKE 1 TABLET BY MOUTH  DAILY. 90 tablet 3  . Multiple Vitamin (MULTIVITAMIN) tablet Take 1 tablet by mouth daily.      . nitroGLYCERIN (NITROSTAT) 0.4  MG SL tablet Place 0.4 mg under the tongue every 5 (five) minutes as needed for chest pain.    Marland Kitchen timolol (BETIMOL) 0.25 % ophthalmic solution Place 1-2 drops into both eyes daily.    . travoprost, benzalkonium, (TRAVATAN) 0.004 % ophthalmic solution Place 1 drop into both eyes at bedtime.     No current facility-administered medications for this visit.    Musculoskeletal: Strength & Muscle Tone: within normal limits Gait & Station: normal Patient leans: N/A  Psychiatric Specialty Exam: ROS  There were no vitals taken for this visit.There is no height or weight on file to calculate BMI.  General Appearance: Casual  Eye Contact:  Good  Speech:  Clear and Coherent  Volume:  Normal  Mood:  Negative  Affect:  Appropriate  Thought Process:  Goal Directed  Orientation:  Full (Time, Place, and Person)  Thought Content:  WDL  Suicidal Thoughts:  No  Homicidal Thoughts:  No  Memory:  NA  Judgement:  Good  Insight:  NA and Good  Psychomotor Activity:  Normal  Concentration:    Recall:  Good  Fund of Knowledge:Good  Language: Good  Akathisia:  No  Handed:  Right  AIMS (if indicated):  not done  Assets:  Desire for Improvement  ADL's:  Intact  Cognition: WNL  Sleep:  Good   Screenings: PHQ2-9     Office Visit from 02/04/2015 in [facility] [place]  PHQ-2 Total Score 0      Assessment and Plan:   12/3/20219:39 AM   This patient is bipolar disorder remission.  There is a possibility that he initially had actually an agitated depression but his initial presentation did seem like a manic episode.  He is done well taking lower and lower doses of Zyprexa and has never had a recurrent of his illness for over a  decade.  At this time his multiple cardiovascular risk factors.  It makes good sense to go ahead today and discontinue his Zyprexa.  Patient was told to call us if anything changes but will be given a return appointment to see me in 7 weeks.  The patient will continue taking his Prozac as prescribed.

## 2020-09-24 ENCOUNTER — Other Ambulatory Visit: Payer: Self-pay

## 2020-09-24 ENCOUNTER — Ambulatory Visit (INDEPENDENT_AMBULATORY_CARE_PROVIDER_SITE_OTHER): Payer: Medicare Other

## 2020-09-24 ENCOUNTER — Encounter: Payer: Self-pay | Admitting: Podiatry

## 2020-09-24 ENCOUNTER — Ambulatory Visit: Payer: Medicare Other | Admitting: Podiatry

## 2020-09-24 DIAGNOSIS — M722 Plantar fascial fibromatosis: Secondary | ICD-10-CM

## 2020-09-24 MED ORDER — TRIAMCINOLONE ACETONIDE 10 MG/ML IJ SUSP
10.0000 mg | Freq: Once | INTRAMUSCULAR | Status: AC
  Administered 2020-09-24: 10 mg

## 2020-09-24 NOTE — Progress Notes (Signed)
Subjective:   Patient ID: Jeffrey Hudson, male   DOB: 69 y.o.   MRN: [id number]   HPI Patient presents with pain in the left plantar heel stating that he had a brace but that is not helped him and is very sore but he gets up in the morning after sitting   ROS      Objective:  Physical Exam  Neurovascular status intact with exquisite discomfort plantar aspect left heel at the insertional point of the tendon into the calcaneus     Assessment:  Acute plantar fasciitis left with inflammation fluid     Plan:  H&P reviewed condition sterile prep done and injected the fascia 3 mg Kenalog 5 mg Xylocaine.  We will see how this does and may require other treatment  X-rays were negative for signs of a fracture associated with this and it appears to be soft tissue

## 2020-10-22 ENCOUNTER — Telehealth: Payer: Self-pay | Admitting: Adult Health

## 2020-10-22 ENCOUNTER — Other Ambulatory Visit: Payer: Self-pay

## 2020-10-22 MED ORDER — HYDROCHLOROTHIAZIDE 12.5 MG PO CAPS: 12.5000 mg | ORAL_CAPSULE | Freq: Two times a day (BID) | ORAL | 2 refills | Status: DC

## 2020-10-22 NOTE — Telephone Encounter (Signed)
Left message for patient to call back and schedule Medicare Annual Wellness Visit (AWV) either virtually or in office.   Last AWV no information please schedule at anytime with [facility] Nurse Health Advisor 1 or 2   This should be a 45 minute visit. 

## 2020-11-07 ENCOUNTER — Other Ambulatory Visit: Payer: Self-pay

## 2020-11-07 ENCOUNTER — Telehealth (INDEPENDENT_AMBULATORY_CARE_PROVIDER_SITE_OTHER): Payer: Medicare Other | Admitting: Psychiatry

## 2020-11-07 DIAGNOSIS — F325 Major depressive disorder, single episode, in full remission: Secondary | ICD-10-CM | POA: Diagnosis not present

## 2020-11-07 NOTE — Progress Notes (Signed)
Psychiatric Initial Adult Assessment   Patient Identification: Jeffrey Hudson MRN:  [id number] Date of Evaluation:  11/07/2020 Referral Source: Dr. Zettie Pho Chief Complaint:   Visit Diagnosis: Bipolar disorder  History of Present Illness:    Today the patient is doing great.  He is off the Zyprexa completely without any problems.  He is very pleased that he came off of it.  Interestingly about a few weeks after he came off of that he noted some mild tardive dyskinesia in his mouth.  After a week or 2 this abated.  He no longer has any of these features.  This is even more support that it is good that he has come off the Zyprexa.  He continues taking the Prozac as prescribed.  He denies any symptoms of psychosis.  He denies any significant psychopathology at this time.  He likely had a distant history of major depression and he will continue taking the Prozac for this condition.  He is functioning extremely well.  He is sleeping and eating well and has no vegetative symptoms.  He will be seen again in 5 months. (Hypo) Manic Symptoms:   Anxiety Symptoms:   Psychotic Symptoms:   PTSD Symptoms:   Past Psychiatric History: Zyprexa and Prozac, has been psychotherapy in the past including cognitive behavioral therapy.  Previous Psychotropic Medications: Zyprexa and Prozac  Substance Abuse History in the last 12 months:    Consequences of Substance Abuse:   Past Medical History:  Past Medical History:  Diagnosis Date  . AMI (acute myocardial infarction) ([place])    Acute ST elevation  . Anxiety   . Bipolar disorder ([place])   . COPD (chronic obstructive pulmonary disease) ([place])   . Coronary artery disease    a. s/p anterolateral STEMI with BMS placed in large diagonal branch (2009).  Marland Kitchen Hyperlipidemia   . Hypertension   . Nodule of left lung    6-mm smooothly rounded nodule  . Pre-diabetes   . Syncope and collapse    in 2001 and 2004 with no clear cause  . Tobacco abuse   . Viral  URI with cough 11/22/2016    Past Surgical History:  Procedure Laterality Date  . stent placed in heart      Family Psychiatric History:   Family History:  Family History  Problem Relation Age of Onset  . Dementia Mother 1       died  . Alcohol abuse Father 4  . Depression Brother     Social History:   Social History   Socioeconomic History  . Marital status: Married    Spouse name: Not on file  . Number of children: 1  . Years of education: Not on file  . Highest education level: Not on file  Occupational History  . Occupation: Retired-Sales/broker  Tobacco Use  . Smoking status: Current Every Day Smoker    Packs/day: 1.00    Years: 45.00    Pack years: 45.00    Types: Cigarettes  . Smokeless tobacco: Never Used  . Tobacco comment: 1 pack a day  Vaping Use  . Vaping Use: Never used  Substance and Sexual Activity  . Alcohol use: Yes    Alcohol/week: 1.0 standard drink    Types: 1 Standard drinks or equivalent per week    Comment: 2 glasses of wine daily  . Drug use: No    Comment: marijuana quit 2010. He has hx of THC use.  Marland Kitchen Sexual activity: Not on file  Other  Topics Concern  . Not on file  Social History Narrative   He is not working   He has been married for 35 years    One child (son)       He likes to [employer] - plays once a week    Social Determinants of Radio broadcast assistant Strain: Not on file  Food Insecurity: Not on file  Transportation Needs: Not on file  Physical Activity: Not on file  Stress: Not on file  Social Connections: Not on file    Additional Social History:   Allergies:   Allergies  Allergen Reactions  . Lisinopril Swelling    Angio edema   . Norvasc [Amlodipine] Swelling    Lower extremity     Metabolic Disorder Labs: Lab Results  Component Value Date   HGBA1C 6.1 08/25/2018   MPG 131 06/24/2009   No results found for: PROLACTIN Lab Results  Component Value Date   CHOL 163 08/05/2020   TRIG 153 (H)  08/05/2020   HDL 66 08/05/2020   CHOLHDL 2.5 08/05/2020   VLDL 28.2 08/25/2018   LDLCALC 71 08/05/2020   LDLCALC 66 06/20/2019   Lab Results  Component Value Date   TSH 1.670 06/20/2019    Therapeutic Level Labs: No results found for: LITHIUM No results found for: CBMZ No results found for: VALPROATE  Current Medications: Current Outpatient Medications  Medication Sig Dispense Refill  . albuterol (PROAIR HFA) 108 (90 Base) MCG/ACT inhaler Inhale 2 puffs into the lungs every 6 (six) hours as needed. 1 Inhaler 3  . aspirin 81 MG tablet Take 81 mg by mouth daily.      Marland Kitchen atorvastatin (LIPITOR) 80 MG tablet TAKE 1 TABLET BY MOUTH  DAILY AT 6 PM 90 tablet 3  . betamethasone dipropionate 0.05 % cream as needed.     . ezetimibe (ZETIA) 10 MG tablet Take 1 tablet (10 mg total) by mouth daily. 90 tablet 3  . FLUoxetine (PROZAC) 20 MG capsule Take 1 capsule (20 mg total) by mouth daily. 90 capsule 1  . Glycopyrrolate-Formoterol (BEVESPI AEROSPHERE) 9-4.8 MCG/ACT AERO Inhale 2 puffs into the lungs 2 (two) times daily. (Patient taking differently: Inhale 2 puffs into the lungs as needed. ) 10.7 g 5  . hydrochlorothiazide (MICROZIDE) 12.5 MG capsule Take 1 capsule (12.5 mg total) by mouth 2 (two) times daily. 180 capsule 2  . meloxicam (MOBIC) 7.5 MG tablet Take 7.5 mg by mouth as needed for pain.    . metoprolol succinate (TOPROL-XL) 25 MG 24 hr tablet TAKE 1 TABLET BY MOUTH  DAILY. 90 tablet 3  . Multiple Vitamin (MULTIVITAMIN) tablet Take 1 tablet by mouth daily.      . nitroGLYCERIN (NITROSTAT) 0.4 MG SL tablet Place 0.4 mg under the tongue every 5 (five) minutes as needed for chest pain.    Marland Kitchen timolol (BETIMOL) 0.25 % ophthalmic solution Place 1-2 drops into both eyes daily.    . travoprost, benzalkonium, (TRAVATAN) 0.004 % ophthalmic solution Place 1 drop into both eyes at bedtime.     No current facility-administered medications for this visit.    Musculoskeletal: Strength & Muscle  Tone: within normal limits Gait & Station: normal Patient leans: N/A  Psychiatric Specialty Exam: ROS  There were no vitals taken for this visit.There is no height or weight on file to calculate BMI.  General Appearance: Casual  Eye Contact:  Good  Speech:  Clear and Coherent  Volume:  Normal  Mood:  Negative  Affect:  Appropriate  Thought Process:  Goal Directed  Orientation:  Full (Time, Place, and Person)  Thought Content:  WDL  Suicidal Thoughts:  No  Homicidal Thoughts:  No  Memory:  NA  Judgement:  Good  Insight:  NA and Good  Psychomotor Activity:  Normal  Concentration:    Recall:  Good  Fund of Knowledge:Good  Language: Good  Akathisia:  No  Handed:  Right  AIMS (if indicated):  not done  Assets:  Desire for Improvement  ADL's:  Intact  Cognition: WNL  Sleep:  Good   Screenings: PHQ2-9   Flowsheet Row Office Visit from 02/04/2015 in [facility] at [place]  PHQ-2 Total Score 0      Assessment and Plan:   1/21/202210:51 AM   Today the patient is doing well.  His diagnosis is that of major depression.  He will continue taking Prozac.  He successfully came off Zyprexa over the last number of years and has had no recurrence of symptomatology.  He will return to see me in 5 months.  So his only major problem is that of major depression in remission.

## 2020-11-20 DIAGNOSIS — Z20822 Contact with and (suspected) exposure to covid-19: Secondary | ICD-10-CM | POA: Diagnosis not present

## 2020-12-01 ENCOUNTER — Other Ambulatory Visit: Payer: Self-pay

## 2020-12-02 ENCOUNTER — Encounter: Payer: Self-pay | Admitting: Adult Health

## 2020-12-02 ENCOUNTER — Ambulatory Visit (INDEPENDENT_AMBULATORY_CARE_PROVIDER_SITE_OTHER): Payer: Medicare Other | Admitting: Adult Health

## 2020-12-02 VITALS — BP 132/80 | Temp 98.5°F | Wt 160.0 lb

## 2020-12-02 DIAGNOSIS — S161XXA Strain of muscle, fascia and tendon at neck level, initial encounter: Secondary | ICD-10-CM | POA: Diagnosis not present

## 2020-12-02 MED ORDER — CYCLOBENZAPRINE HCL 10 MG PO TABS: 10.0000 mg | ORAL_TABLET | Freq: Every evening | ORAL | 0 refills | Status: DC | PRN

## 2020-12-02 MED ORDER — METHYLPREDNISOLONE 4 MG PO TBPK: ORAL_TABLET | ORAL | 0 refills | Status: DC

## 2020-12-02 NOTE — Progress Notes (Signed)
Subjective:    Patient ID: Jeffrey Hudson, male    DOB: 1951-10-02, 70 y.o.   MRN: [id number]  HPI  70 year old male who  has a past medical history of AMI (acute myocardial infarction) ([place]), Anxiety, Bipolar disorder ([place]), COPD (chronic obstructive pulmonary disease) ([place]), Coronary artery disease, Hyperlipidemia, Hypertension, Nodule of left lung, Pre-diabetes, Syncope and collapse, Tobacco abuse, and Viral URI with cough (11/22/2016).  He presents to the office today for cervical spine pain. He reports that his symptoms have been present for about a month. Reports that he got new " fluffy pillows" and after using them for a night he had neck pain with stiffness. His symptoms improved slightly over the last month but he continues to have a dull pain at the base of the skull. Has a mild headache. No other symptoms   Has been using Motrin, heating pad and ice--- all produce temporarily relief.   He has gone back to his flat pillow  Review of Systems See HPI   Past Medical History:  Diagnosis Date  . AMI (acute myocardial infarction) ([place])    Acute ST elevation  . Anxiety   . Bipolar disorder ([place])   . COPD (chronic obstructive pulmonary disease) ([place])   . Coronary artery disease    a. s/p anterolateral STEMI with BMS placed in large diagonal branch (2009).  Marland Kitchen Hyperlipidemia   . Hypertension   . Nodule of left lung    6-mm smooothly rounded nodule  . Pre-diabetes   . Syncope and collapse    in 2001 and 2004 with no clear cause  . Tobacco abuse   . Viral URI with cough 11/22/2016    Social History   Socioeconomic History  . Marital status: Married    Spouse name: Not on file  . Number of children: 1  . Years of education: Not on file  . Highest education level: Not on file  Occupational History  . Occupation: Retired-Sales/broker  Tobacco Use  . Smoking status: Current Every Day Smoker    Packs/day: 1.00    Years: 45.00    Pack years: 45.00    Types: Cigarettes   . Smokeless tobacco: Never Used  . Tobacco comment: 1 pack a day  Vaping Use  . Vaping Use: Never used  Substance and Sexual Activity  . Alcohol use: Yes    Alcohol/week: 1.0 standard drink    Types: 1 Standard drinks or equivalent per week    Comment: 2 glasses of wine daily  . Drug use: No    Comment: marijuana quit 2010. He has hx of THC use.  Marland Kitchen Sexual activity: Not on file  Other Topics Concern  . Not on file  Social History Narrative   He is not working   He has been married for 35 years    One child (son)       He likes to [employer] - plays once a week    Social Determinants of Radio broadcast assistant Strain: Not on file  Food Insecurity: Not on file  Transportation Needs: Not on file  Physical Activity: Not on file  Stress: Not on file  Social Connections: Not on file  Intimate Partner Violence: Not on file    Past Surgical History:  Procedure Laterality Date  . stent placed in heart      Family History  Problem Relation Age of Onset  . Dementia Mother 10       died  .  Alcohol abuse Father 77  . Depression Brother     Allergies  Allergen Reactions  . Lisinopril Swelling    Angio edema   . Norvasc [Amlodipine] Swelling    Lower extremity     Current Outpatient Medications on File Prior to Visit  Medication Sig Dispense Refill  . albuterol (PROAIR HFA) 108 (90 Base) MCG/ACT inhaler Inhale 2 puffs into the lungs every 6 (six) hours as needed. 1 Inhaler 3  . aspirin 81 MG tablet Take 81 mg by mouth daily.    Marland Kitchen atorvastatin (LIPITOR) 80 MG tablet TAKE 1 TABLET BY MOUTH  DAILY AT 6 PM 90 tablet 3  . betamethasone dipropionate 0.05 % cream as needed.     . ezetimibe (ZETIA) 10 MG tablet Take 1 tablet (10 mg total) by mouth daily. 90 tablet 3  . FLUoxetine (PROZAC) 20 MG capsule Take 1 capsule (20 mg total) by mouth daily. 90 capsule 1  . Glycopyrrolate-Formoterol (BEVESPI AEROSPHERE) 9-4.8 MCG/ACT AERO Inhale 2 puffs into the lungs 2 (two) times daily.  (Patient taking differently: Inhale 2 puffs into the lungs as needed.) 10.7 g 5  . hydrochlorothiazide (MICROZIDE) 12.5 MG capsule Take 1 capsule (12.5 mg total) by mouth 2 (two) times daily. 180 capsule 2  . meloxicam (MOBIC) 7.5 MG tablet Take 7.5 mg by mouth as needed for pain.    . metoprolol succinate (TOPROL-XL) 25 MG 24 hr tablet TAKE 1 TABLET BY MOUTH  DAILY. 90 tablet 3  . Multiple Vitamin (MULTIVITAMIN) tablet Take 1 tablet by mouth daily.    . nitroGLYCERIN (NITROSTAT) 0.4 MG SL tablet Place 0.4 mg under the tongue every 5 (five) minutes as needed for chest pain.    Marland Kitchen timolol (BETIMOL) 0.25 % ophthalmic solution Place 1-2 drops into both eyes daily.    . travoprost, benzalkonium, (TRAVATAN) 0.004 % ophthalmic solution Place 1 drop into both eyes at bedtime.     No current facility-administered medications on file prior to visit.    BP 132/80   Temp 98.5 F (36.9 C)   Wt 160 lb (72.6 kg)   BMI 25.82 kg/m       Objective:   Physical Exam Vitals and nursing note reviewed.  Constitutional:      Appearance: Normal appearance.  Neck:   Cardiovascular:     Rate and Rhythm: Normal rate and regular rhythm.     Pulses: Normal pulses.     Heart sounds: Normal heart sounds.  Musculoskeletal:        General: Tenderness present. Normal range of motion.  Skin:    General: Skin is warm and dry.  Neurological:     Mental Status: He is alert.       Assessment & Plan:  1. Strain of neck muscle, initial encounter - No cervical spine tenderness. Appears muscular.  - cyclobenzaprine (FLEXERIL) 10 MG tablet; Take 1 tablet (10 mg total) by mouth at bedtime as needed for muscle spasms.  Dispense: 30 tablet; Refill: 0 - methylPREDNISolone (MEDROL DOSEPAK) 4 MG TBPK tablet; Take as directed  Dispense: 21 tablet; Refill: 0 - Follow up if no improvement   [employer]

## 2020-12-04 ENCOUNTER — Encounter: Payer: Self-pay | Admitting: Adult Health

## 2020-12-08 DIAGNOSIS — H6123 Impacted cerumen, bilateral: Secondary | ICD-10-CM | POA: Diagnosis not present

## 2020-12-09 DIAGNOSIS — H903 Sensorineural hearing loss, bilateral: Secondary | ICD-10-CM | POA: Diagnosis not present

## 2020-12-10 ENCOUNTER — Ambulatory Visit: Payer: Medicare Other | Admitting: Podiatry

## 2020-12-19 DIAGNOSIS — H90A32 Mixed conductive and sensorineural hearing loss, unilateral, left ear with restricted hearing on the contralateral side: Secondary | ICD-10-CM | POA: Diagnosis not present

## 2020-12-19 DIAGNOSIS — H9312 Tinnitus, left ear: Secondary | ICD-10-CM | POA: Diagnosis not present

## 2020-12-26 ENCOUNTER — Encounter: Payer: Self-pay | Admitting: Adult Health

## 2021-01-02 ENCOUNTER — Encounter: Payer: Self-pay | Admitting: Adult Health

## 2021-01-15 ENCOUNTER — Encounter: Payer: Self-pay | Admitting: Adult Health

## 2021-01-15 ENCOUNTER — Ambulatory Visit (INDEPENDENT_AMBULATORY_CARE_PROVIDER_SITE_OTHER): Payer: Medicare Other

## 2021-01-15 ENCOUNTER — Ambulatory Visit (INDEPENDENT_AMBULATORY_CARE_PROVIDER_SITE_OTHER): Payer: Medicare Other | Admitting: Adult Health

## 2021-01-15 ENCOUNTER — Other Ambulatory Visit: Payer: Self-pay

## 2021-01-15 ENCOUNTER — Other Ambulatory Visit: Payer: Self-pay | Admitting: Adult Health

## 2021-01-15 VITALS — BP 130/72 | HR 64 | Temp 98.6°F | Ht 65.0 in | Wt 161.0 lb

## 2021-01-15 DIAGNOSIS — F3170 Bipolar disorder, currently in remission, most recent episode unspecified: Secondary | ICD-10-CM | POA: Diagnosis not present

## 2021-01-15 DIAGNOSIS — K219 Gastro-esophageal reflux disease without esophagitis: Secondary | ICD-10-CM

## 2021-01-15 DIAGNOSIS — M542 Cervicalgia: Secondary | ICD-10-CM | POA: Diagnosis not present

## 2021-01-15 DIAGNOSIS — Z125 Encounter for screening for malignant neoplasm of prostate: Secondary | ICD-10-CM

## 2021-01-15 DIAGNOSIS — Z0000 Encounter for general adult medical examination without abnormal findings: Secondary | ICD-10-CM | POA: Diagnosis not present

## 2021-01-15 DIAGNOSIS — E785 Hyperlipidemia, unspecified: Secondary | ICD-10-CM | POA: Diagnosis not present

## 2021-01-15 DIAGNOSIS — J432 Centrilobular emphysema: Secondary | ICD-10-CM | POA: Diagnosis not present

## 2021-01-15 DIAGNOSIS — E7800 Pure hypercholesterolemia, unspecified: Secondary | ICD-10-CM | POA: Diagnosis not present

## 2021-01-15 DIAGNOSIS — S161XXA Strain of muscle, fascia and tendon at neck level, initial encounter: Secondary | ICD-10-CM | POA: Diagnosis not present

## 2021-01-15 DIAGNOSIS — G4733 Obstructive sleep apnea (adult) (pediatric): Secondary | ICD-10-CM | POA: Diagnosis not present

## 2021-01-15 DIAGNOSIS — Z1211 Encounter for screening for malignant neoplasm of colon: Secondary | ICD-10-CM | POA: Diagnosis not present

## 2021-01-15 DIAGNOSIS — I10 Essential (primary) hypertension: Secondary | ICD-10-CM

## 2021-01-15 DIAGNOSIS — F17200 Nicotine dependence, unspecified, uncomplicated: Secondary | ICD-10-CM | POA: Diagnosis not present

## 2021-01-15 LAB — CBC WITH DIFFERENTIAL/PLATELET
Basophils Absolute: 0.1 10*3/uL (ref 0.0–0.1)
Basophils Relative: 1.6 % (ref 0.0–3.0)
Eosinophils Absolute: 0.4 10*3/uL (ref 0.0–0.7)
Eosinophils Relative: 3.9 % (ref 0.0–5.0)
HCT: 40.4 % (ref 39.0–52.0)
Hemoglobin: 13.9 g/dL (ref 13.0–17.0)
Lymphocytes Relative: 17.9 % (ref 12.0–46.0)
Lymphs Abs: 1.7 10*3/uL (ref 0.7–4.0)
MCHC: 34.5 g/dL (ref 30.0–36.0)
MCV: 97.1 fl (ref 78.0–100.0)
Monocytes Absolute: 1.2 10*3/uL — ABNORMAL HIGH (ref 0.1–1.0)
Monocytes Relative: 12.6 % — ABNORMAL HIGH (ref 3.0–12.0)
Neutro Abs: 6 10*3/uL (ref 1.4–7.7)
Neutrophils Relative %: 64 % (ref 43.0–77.0)
Platelets: 223 10*3/uL (ref 150.0–400.0)
RBC: 4.16 Mil/uL — ABNORMAL LOW (ref 4.22–5.81)
RDW: 13.3 % (ref 11.5–15.5)
WBC: 9.3 10*3/uL (ref 4.0–10.5)

## 2021-01-15 LAB — COMPREHENSIVE METABOLIC PANEL
ALT: 20 U/L (ref 0–53)
AST: 23 U/L (ref 0–37)
Albumin: 4.2 g/dL (ref 3.5–5.2)
Alkaline Phosphatase: 102 U/L (ref 39–117)
BUN: 25 mg/dL — ABNORMAL HIGH (ref 6–23)
CO2: 26 mEq/L (ref 19–32)
Calcium: 9.9 mg/dL (ref 8.4–10.5)
Chloride: 99 mEq/L (ref 96–112)
Creatinine, Ser: 0.89 mg/dL (ref 0.40–1.50)
GFR: 86.34 mL/min (ref >60.00)
Glucose, Bld: 129 mg/dL — ABNORMAL HIGH (ref 70–99)
Potassium: 4 mEq/L (ref 3.5–5.1)
Sodium: 138 mEq/L (ref 135–145)
Total Bilirubin: 1.3 mg/dL — ABNORMAL HIGH (ref 0.2–1.2)
Total Protein: 6.5 g/dL (ref 6.0–8.3)

## 2021-01-15 LAB — LIPID PANEL
Cholesterol: 174 mg/dL (ref 0–200)
HDL: 64.7 mg/dL
LDL Cholesterol: 77 mg/dL (ref 0–99)
NonHDL: 109.31
Total CHOL/HDL Ratio: 3
Triglycerides: 160 mg/dL — ABNORMAL HIGH (ref 0.0–149.0)
VLDL: 32 mg/dL (ref 0.0–40.0)

## 2021-01-15 LAB — TSH: TSH: 3.4 u[IU]/mL (ref 0.35–4.50)

## 2021-01-15 LAB — HEMOGLOBIN A1C: Hgb A1c MFr Bld: 6.4 % (ref 4.6–6.5)

## 2021-01-15 LAB — PSA: PSA: 0.27 ng/mL (ref 0.10–4.00)

## 2021-01-15 MED ORDER — METFORMIN HCL 500 MG PO TABS: 500.0000 mg | ORAL_TABLET | Freq: Every day | ORAL | 1 refills | Status: DC

## 2021-01-15 NOTE — Progress Notes (Signed)
Subjective:    Patient ID: Jeffrey Hudson, male    DOB: Mar 16, 1951, 70 y.o.   MRN: [id number]  HPI Patient presents for yearly preventative medicine examination. He is a pleasant 70 year old male who  has a past medical history of AMI (acute myocardial infarction) ([place]), Anxiety, Bipolar disorder ([place]), COPD (chronic obstructive pulmonary disease) ([place]), Coronary artery disease, Hyperlipidemia, Hypertension, Nodule of left lung, Pre-diabetes, Syncope and collapse, Tobacco abuse, and Viral URI with cough (11/22/2016).  Hyperlipidemia/CAD - s/p anterior lateral STEMI with BMS placed in large diagonal branch in 2009.  His last cardiac cath was in for 2011 which showed a patent stent with 40 to 50% restenosis, 20% LAD in several locations, 50% CX, 30% distal RCA, and 60% focal PLA.  Currently prescribed Lipitor 80 and Zetia 10.  He denies chest pain, shortness of breath with rest, palpitations Lab Results  Component Value Date   CHOL 163 08/05/2020   HDL 66 08/05/2020   LDLCALC 71 08/05/2020   TRIG 153 (H) 08/05/2020   CHOLHDL 2.5 08/05/2020   Hypertension -controlled with Toprol 25 mg daily BP Readings from Last 3 Encounters:  12/02/20 132/80  08/05/20 130/80  05/01/20 130/70   COPD -managed by pulmonary.  Has shortness of breath with exercise but has not had to use his inhaler on a regular basis  Tobacco Use - continues to smoke about 1/2 pack per day. Gets yearly low dose CT for lung cancer screening  Bipolar Disorder -followed by psychiatry.  Currently well maintained on Zyprexa and Prozac.  Glaucoma - followed by opthalmology.   Acute Issues   GERD-has been experiencing acid reflux and increased belching as well as flatulence.  Started Prilosec about 11 days ago and heartburn improved but he continues to have increased belching and flatulence.  Denies abdominal pain.  Has been watching his diet and eating more of a bland diet.  Denies issues with bowel movements.  Neck pain  -was originally seen in February 2022 for this complaint.  Symptoms were present for a month at this time.  He reported having a dull pain at the base of the skull after changing pillows.  Was also having a mild headache but no other symptoms.  He was using Motrin, heating pad, and ice, which produced temporary relief.  He had no cervical spine tenderness on exam and appeared to be more muscular.  He was prescribed Flexeril and a Medrol Dosepak which greatly reduced his discomfort but unfortunately, he continues to have neck pain base of his skull   All immunizations and health maintenance protocols were reviewed with the patient and needed orders were placed.  Appropriate screening laboratory values were ordered for the patient including screening of hyperlipidemia, renal function and hepatic function. If indicated by BPH, a PSA was ordered.  Medication reconciliation,  past medical history, social history, problem list and allergies were reviewed in detail with the patient  Goals were established with regard to weight loss, exercise, and  diet in compliance with medications  He is over due for colonoscopy.    Review of Systems  Constitutional: Negative.   HENT: Negative.   Eyes: Negative.   Respiratory: Negative.   Cardiovascular: Negative.   Gastrointestinal: Positive for abdominal distention. Negative for abdominal pain, blood in stool, constipation, diarrhea, nausea and vomiting.  Endocrine: Negative.   Genitourinary: Negative.   Musculoskeletal: Positive for neck pain and neck stiffness.  Skin: Negative.   Allergic/Immunologic: Negative.   Neurological: Negative.  Hematological: Negative.   Psychiatric/Behavioral: Negative.   All other systems reviewed and are negative.  Past Medical History:  Diagnosis Date  . AMI (acute myocardial infarction) ([place])    Acute ST elevation  . Anxiety   . Bipolar disorder ([place])   . COPD (chronic obstructive pulmonary disease) ([place])   .  Coronary artery disease    a. s/p anterolateral STEMI with BMS placed in large diagonal branch (2009).  Marland Kitchen Hyperlipidemia   . Hypertension   . Nodule of left lung    6-mm smooothly rounded nodule  . Pre-diabetes   . Syncope and collapse    in 2001 and 2004 with no clear cause  . Tobacco abuse   . Viral URI with cough 11/22/2016    Social History   Socioeconomic History  . Marital status: Married    Spouse name: Not on file  . Number of children: 1  . Years of education: Not on file  . Highest education level: Not on file  Occupational History  . Occupation: Retired-Sales/broker  Tobacco Use  . Smoking status: Current Every Day Smoker    Packs/day: 1.00    Years: 45.00    Pack years: 45.00    Types: Cigarettes  . Smokeless tobacco: Never Used  . Tobacco comment: 1 pack a day  Vaping Use  . Vaping Use: Never used  Substance and Sexual Activity  . Alcohol use: Yes    Alcohol/week: 1.0 standard drink    Types: 1 Standard drinks or equivalent per week    Comment: 2 glasses of wine daily  . Drug use: No    Comment: marijuana quit 2010. He has hx of THC use.  Marland Kitchen Sexual activity: Not on file  Other Topics Concern  . Not on file  Social History Narrative   He is not working   He has been married for 35 years    One child (son)       He likes to [employer] - plays once a week    Social Determinants of Radio broadcast assistant Strain: Not on file  Food Insecurity: Not on file  Transportation Needs: Not on file  Physical Activity: Not on file  Stress: Not on file  Social Connections: Not on file  Intimate Partner Violence: Not on file    Past Surgical History:  Procedure Laterality Date  . stent placed in heart      Family History  Problem Relation Age of Onset  . Dementia Mother 6       died  . Alcohol abuse Father 54  . Depression Brother     Allergies  Allergen Reactions  . Lisinopril Swelling    Angio edema   . Norvasc [Amlodipine] Swelling    Lower  extremity     Current Outpatient Medications on File Prior to Visit  Medication Sig Dispense Refill  . albuterol (PROAIR HFA) 108 (90 Base) MCG/ACT inhaler Inhale 2 puffs into the lungs every 6 (six) hours as needed. 1 Inhaler 3  . aspirin 81 MG tablet Take 81 mg by mouth daily.    Marland Kitchen atorvastatin (LIPITOR) 80 MG tablet TAKE 1 TABLET BY MOUTH  DAILY AT 6 PM 90 tablet 3  . betamethasone dipropionate 0.05 % cream as needed.     . cyclobenzaprine (FLEXERIL) 10 MG tablet Take 1 tablet (10 mg total) by mouth at bedtime as needed for muscle spasms. 30 tablet 0  . ezetimibe (ZETIA) 10 MG tablet Take 1 tablet (10  mg total) by mouth daily. 90 tablet 3  . FLUoxetine (PROZAC) 20 MG capsule Take 1 capsule (20 mg total) by mouth daily. 90 capsule 1  . Glycopyrrolate-Formoterol (BEVESPI AEROSPHERE) 9-4.8 MCG/ACT AERO Inhale 2 puffs into the lungs 2 (two) times daily. (Patient taking differently: Inhale 2 puffs into the lungs as needed.) 10.7 g 5  . hydrochlorothiazide (MICROZIDE) 12.5 MG capsule Take 1 capsule (12.5 mg total) by mouth 2 (two) times daily. 180 capsule 2  . meloxicam (MOBIC) 7.5 MG tablet Take 7.5 mg by mouth as needed for pain.    . methylPREDNISolone (MEDROL DOSEPAK) 4 MG TBPK tablet Take as directed 21 tablet 0  . metoprolol succinate (TOPROL-XL) 25 MG 24 hr tablet TAKE 1 TABLET BY MOUTH  DAILY. 90 tablet 3  . Multiple Vitamin (MULTIVITAMIN) tablet Take 1 tablet by mouth daily.    . nitroGLYCERIN (NITROSTAT) 0.4 MG SL tablet Place 0.4 mg under the tongue every 5 (five) minutes as needed for chest pain.    Marland Kitchen timolol (BETIMOL) 0.25 % ophthalmic solution Place 1-2 drops into both eyes daily.    . travoprost, benzalkonium, (TRAVATAN) 0.004 % ophthalmic solution Place 1 drop into both eyes at bedtime.     No current facility-administered medications on file prior to visit.    There were no vitals taken for this visit.      Objective:   Physical Exam Vitals and nursing note reviewed.   Constitutional:      General: He is not in acute distress.    Appearance: Normal appearance. He is well-developed and normal weight.  HENT:     Head: Normocephalic and atraumatic.     Right Ear: Tympanic membrane, ear canal and external ear normal. There is no impacted cerumen.     Left Ear: Tympanic membrane, ear canal and external ear normal. There is no impacted cerumen.     Nose: Nose normal. No congestion or rhinorrhea.     Mouth/Throat:     Mouth: Mucous membranes are moist.     Pharynx: Oropharynx is clear. No oropharyngeal exudate or posterior oropharyngeal erythema.  Eyes:     General:        Right eye: No discharge.        Left eye: No discharge.     Extraocular Movements: Extraocular movements intact.     Conjunctiva/sclera: Conjunctivae normal.     Pupils: Pupils are equal, round, and reactive to light.  Neck:     Vascular: No carotid bruit.     Trachea: No tracheal deviation.  Cardiovascular:     Rate and Rhythm: Normal rate and regular rhythm.     Pulses: Normal pulses.     Heart sounds: Normal heart sounds. No murmur heard. No friction rub. No gallop.   Pulmonary:     Effort: Pulmonary effort is normal. No respiratory distress.     Breath sounds: Normal breath sounds. No stridor. No wheezing, rhonchi or rales.  Chest:     Chest wall: No tenderness.  Abdominal:     General: Bowel sounds are normal. There is distension.     Palpations: Abdomen is soft. There is no mass.     Tenderness: There is no abdominal tenderness. There is no right CVA tenderness, left CVA tenderness, guarding or rebound.     Hernia: No hernia is present.  Musculoskeletal:        General: Tenderness present. No swelling, deformity or signs of injury. Normal range of motion.  Cervical back: No swelling, spasms, tenderness, bony tenderness or crepitus. No pain with movement. Normal range of motion.     Right lower leg: No edema.     Left lower leg: No edema.     Comments: TTP throughout  upper trapezius and levator scapulaue muscles bilaterally.    Lymphadenopathy:     Cervical: No cervical adenopathy.  Skin:    General: Skin is warm and dry.     Capillary Refill: Capillary refill takes less than 2 seconds.     Coloration: Skin is not jaundiced or pale.     Findings: No bruising, erythema, lesion or rash.  Neurological:     General: No focal deficit present.     Mental Status: He is alert and oriented to person, place, and time.     Cranial Nerves: No cranial nerve deficit.     Sensory: No sensory deficit.     Motor: No weakness.     Coordination: Coordination normal.     Gait: Gait normal.     Deep Tendon Reflexes: Reflexes normal.  Psychiatric:        Mood and Affect: Mood normal.        Behavior: Behavior normal.        Thought Content: Thought content normal.        Judgment: Judgment normal.       Assessment & Plan:  1. Routine general medical examination at a health care facility - Follow up in one year or sooner if needed - Work on lifestyle modifications  - CBC with Differential/Platelet; Future - Comprehensive metabolic panel; Future - Hemoglobin A1c; Future - Lipid panel; Future - TSH; Future - PSA; Future  2. Strain of neck muscle, initial encounter - Still appears to be muscular but d/t duration will check xray of cervical spine.  - Likely referral to PT - DG Cervical Spine Complete; Future - CBC with Differential/Platelet; Future - Comprehensive metabolic panel; Future - Hemoglobin A1c; Future - Lipid panel; Future - TSH; Future - PSA - CBC with Differential/Platelet - Comprehensive metabolic panel - Hemoglobin A1c - TSH - Lipid panel  3. Prostate cancer screening  - PSA; Future - PSA  4. Colon cancer screening  - Ambulatory referral to Gastroenterology  5. Essential hypertension - No change in medications - controlled  - CBC with Differential/Platelet; Future - Comprehensive metabolic panel; Future - Hemoglobin A1c;  Future - Lipid panel; Future - TSH; Future  6. Bipolar affective disorder in remission ([place]) - Follow up with psych as directed  7. OSA (obstructive sleep apnea) - Continue with CPAP - Follow up with Pulmonary as directed  8. Centrilobular emphysema ([place]) - Follow up with Pulmonary as directed  9. Pure hypercholesterolemia - Follow up with Cardiology as directed - CBC with Differential/Platelet; Future - Comprehensive metabolic panel; Future - Hemoglobin A1c; Future - Lipid panel; Future - TSH; Future  10. TOBACCO ABUSE - Needs to quit smoking   11. Gastroesophageal reflux disease without esophagitis - Will have him stop Prilosec after 14 days  - Consider referral to GI   Dorothyann Peng, NP

## 2021-01-16 ENCOUNTER — Other Ambulatory Visit: Payer: Self-pay | Admitting: Adult Health

## 2021-01-16 ENCOUNTER — Encounter: Payer: Self-pay | Admitting: Adult Health

## 2021-01-16 DIAGNOSIS — M542 Cervicalgia: Secondary | ICD-10-CM

## 2021-01-21 ENCOUNTER — Other Ambulatory Visit: Payer: Self-pay

## 2021-01-21 ENCOUNTER — Ambulatory Visit: Payer: Medicare Other | Attending: Adult Health | Admitting: Physical Therapy

## 2021-01-21 ENCOUNTER — Encounter: Payer: Self-pay | Admitting: Physical Therapy

## 2021-01-21 DIAGNOSIS — M542 Cervicalgia: Secondary | ICD-10-CM | POA: Diagnosis not present

## 2021-01-21 DIAGNOSIS — R252 Cramp and spasm: Secondary | ICD-10-CM | POA: Insufficient documentation

## 2021-01-21 NOTE — Patient Instructions (Addendum)
Trigger Point Dry Needling  . What is Trigger Point Dry Needling (DN)? o DN is a physical therapy technique used to treat muscle pain and dysfunction. Specifically, DN helps deactivate muscle trigger points (muscle knots).  o A thin filiform needle is used to penetrate the skin and stimulate the underlying trigger point. The goal is for a local twitch response (LTR) to occur and for the trigger point to relax. No medication of any kind is injected during the procedure.   . What Does Trigger Point Dry Needling Feel Like?  o The procedure feels different for each individual patient. Some patients report that they do not actually feel the needle enter the skin and overall the process is not painful. Very mild bleeding may occur. However, many patients feel a deep cramping in the muscle in which the needle was inserted. This is the local twitch response.   Marland Kitchen How Will I feel after the treatment? o Soreness is normal, and the onset of soreness may not occur for a few hours. Typically this soreness does not last longer than two days.  o Bruising is uncommon, however; ice can be used to decrease any possible bruising.  o In rare cases feeling tired or nauseous after the treatment is normal. In addition, your symptoms may get worse before they get better, this period will typically not last longer than 24 hours.   . What Can I do After My Treatment? o Increase your hydration by drinking more water for the next 24 hours. o You may place ice or heat on the areas treated that have become sore, however, do not use heat on inflamed or bruised areas. Heat often brings more relief post needling. o You can continue your regular activities, but vigorous activity is not recommended initially after the treatment for 24 hours. o DN is best combined with other physical therapy such as strengthening, stretching, and other therapies.    Access Code: [email or web address] URL: [email or web address] Date:  01/21/2021 Prepared by: Everardo All  Exercises Seated Neck Sidebending Stretch with Pillow - 2 x daily - 7 x weekly - 1 sets - 2 reps - 15s hold Gentle Levator Scapulae Stretch - 2 x daily - 7 x weekly - 1 sets - 2 reps - 15s hold Seated Assisted Cervical Rotation with Towel - 2 x daily - 7 x weekly - 1 sets - 2 reps - 15s hold Shoulder External Rotation and Scapular Retraction with Resistance - 1 x daily - 7 x weekly - 2 sets - 10 reps

## 2021-01-21 NOTE — Therapy (Signed)
[facility] [address] [place], [place], [address] Phone: [phone]   Fax:  [phone]  Physical Therapy Evaluation  Patient Details  Name: Jeffrey Hudson MRN: [id number] Date of Birth: Apr 30, 1951 Referring Provider (PT): Dorothyann Peng, MD   Encounter Date: 01/21/2021   PT End of Session - 01/21/21 1101    Visit Number 1    Number of Visits 12    Date for PT Re-Evaluation 03/04/21    Authorization Type [employer] medicare    Progress Note Due on Visit 10    PT Start Time 1010    PT Stop Time 1051    PT Time Calculation (min) 41 min    Activity Tolerance Patient tolerated treatment well    Behavior During Therapy [facility] for tasks assessed/performed           Past Medical History:  Diagnosis Date  . AMI (acute myocardial infarction) ([place])    Acute ST elevation  . Anxiety   . Bipolar disorder ([place])   . COPD (chronic obstructive pulmonary disease) ([place])   . Coronary artery disease    a. s/p anterolateral STEMI with BMS placed in large diagonal branch (2009).  Marland Kitchen Hyperlipidemia   . Hypertension   . Nodule of left lung    6-mm smooothly rounded nodule  . Pre-diabetes   . Syncope and collapse    in 2001 and 2004 with no clear cause  . Tobacco abuse   . Viral URI with cough 11/22/2016    Past Surgical History:  Procedure Laterality Date  . stent placed in heart      There were no vitals filed for this visit.    Subjective Assessment - 01/21/21 1016    Subjective Patient presenting due to cervical pain. Reports chronic neck pain for years but had a recent exacerbation in January with significant pain. Since that time pain intensity has decreased but remains constant.    Pertinent History HTN, COPD, hx tobacco and alcohol abuse    How long can you sit comfortably? unlimited    How long can you stand comfortably? unlimited    How long can you walk comfortably? unlimited    Patient Stated Goals to not have  pain; sleep through the night    Currently in Pain? Yes    Pain Score 1    10/10 at worst   Pain Location Neck    Pain Orientation Right;Left    Pain Descriptors / Indicators Nagging    Pain Type Chronic pain    Pain Onset More than a month ago    Pain Frequency Constant    Aggravating Factors  computer use    Pain Relieving Factors heat    Effect of Pain on Daily Activities computer use; unable to sleep through the night    Multiple Pain Sites No              [facility] PT Assessment - 01/21/21 0001      Assessment   Medical Diagnosis M54.2 (ICD-10-CM) - Neck pain    Referring Provider (PT) Dorothyann Peng, MD    Onset Date/Surgical Date 10/18/20    Hand Dominance Right    Next MD Visit None scheduled    Prior Therapy No      Precautions   Precautions None      Restrictions   Weight Bearing Restrictions No      Balance Screen   Has the patient fallen in the past 6 months  No    Has the patient had a decrease in activity level because of a fear of falling?  No    Is the patient reluctant to leave their home because of a fear of falling?  No      Home Ecologist residence    Living Arrangements Spouse/significant other    Type of [place] Two level      Prior Function   Level of Independence Independent    Vocation Retired      ROM / Strength   AROM / PROM / Strength AROM;Strength      AROM   AROM Assessment Site Cervical;Shoulder    Right Shoulder ABduction 155 Degrees    Left Shoulder ABduction 122 Degrees    Cervical Flexion 42    Cervical Extension 50    Cervical - Right Side Bend 36    Cervical - Left Side Bend 20    Cervical - Right Rotation 56    Cervical - Left Rotation 46      Strength   Overall Strength Comments Cervical and bil shoulder strength grossly 4+/5      Palpation   Palpation comment increased tenderness to palpation of Lt upper trap and levator scap; Minimal tenderness with CPAs C1-T1       Special Tests    Special Tests Cervical    Cervical Tests Spurling's      Spurling's   Findings Negative    Side Right    Comment Negative Lt as well                      Objective measurements completed on examination: See above findings.               PT Education - 01/21/21 1047    Education Details Access Code: [email or web address]    Person(s) Educated Patient    Methods Explanation;Demonstration;Tactile cues;Verbal cues;Handout    Comprehension Verbalized understanding;Returned demonstration;Verbal cues required;Tactile cues required            PT Short Term Goals - 01/21/21 1059      PT SHORT TERM GOAL #1   Title Patient will be independent with HEP for continued progression at home.    Time 3    Period Weeks    Status New    Target Date 02/11/21      PT SHORT TERM GOAL #2   Title Patient will demonstrate 60 degrees cervical rotation bilaterally to indicate improved mobility for ADLs    Time 3    Period Weeks    Status New    Target Date 02/11/21             PT Long Term Goals - 01/21/21 1100      PT LONG TERM GOAL #1   Title FOTO goal    Time 6    Period Weeks    Status New    Target Date 03/04/21      PT LONG TERM GOAL #2   Title Patient will be independent with advanced HEP for long term management of symptoms post D/C.    Time 6    Period Weeks    Status New    Target Date 03/04/21      PT LONG TERM GOAL #3   Title Patient will report no more than one instace of waking due to pain for two consecutive weeks for improved sleep hygiene  Time 6    Period Weeks    Status New    Target Date 03/04/21                  Plan - 01/21/21 1052    Clinical Impression Statement Patient is a 70 y/o male presenting to physical therapy services due to chronic cervical pain with recent proloned exacerbation starting approx. 3 mos. ago. PMH includes HTN, COPD, and history of alcohol and tobacco abuse. Patient reports that current  activity limitations include computer use and sleep hygiened as he is often awakened due to pain. Patient demonstrats significant cervical AROM impairments though Lt > Rt. He demonstrates Lt UE ROM deficits as he is unable to perform flexion past 120 degrees. Patient demos no gross strength impairments as cervical and bil shoulder strength grossly 5-/5. Noting impaired eccentric control when performing seated bilateral ER exercise indicating scapular weakness. Patient reports significant Lt upper trap tenderness with palpation. Would be a good candidate for DN and handout provided. Would benefit from skilled intervention to address impairments for decreased pain to more readily perform ADLs and for improved sleep hygiene.    Personal Factors and Comorbidities Comorbidity 3+    Comorbidities HTN, COPD, and history of alcohol and tobacco abuse    Examination-Activity Limitations Sleep    Examination-Participation Restrictions Personal Finances    Stability/Clinical Decision Making Stable/Uncomplicated    Clinical Decision Making Low    Rehab Potential Excellent    PT Frequency 2x / week    PT Duration 6 weeks    PT Treatment/Interventions ADLs/Self Care Home Management;Electrical Stimulation;Cryotherapy;Iontophoresis 4mg /ml Dexamethasone;Moist Heat;Traction;Ultrasound;Therapeutic activities;Therapeutic exercise;Neuromuscular re-education;Patient/family education;Manual techniques;Passive range of motion;Dry needling;Taping;Spinal Manipulations;Joint Manipulations    PT Next Visit Plan introduce DN; review HEP; progress postural training and cervical mobility to patient tolerance    PT Home Exercise Plan Access Code [email or web address]    Consulted and Agree with Plan of Care Patient           Patient will benefit from skilled therapeutic intervention in order to improve the following deficits and impairments:  Decreased activity tolerance,Decreased mobility,Decreased range of motion,Decreased  strength,Hypomobility,Increased fascial restricitons,Increased muscle spasms,Impaired flexibility,Postural dysfunction,Pain  Visit Diagnosis: Cramp and spasm - Plan: PT plan of care cert/re-cert  Cervicalgia - Plan: PT plan of care cert/re-cert     Problem List Patient Active Problem List   Diagnosis Date Noted  . Plantar fasciitis of left foot 09/03/2020  . Essential hypertension 05/06/2016  . Multiple pulmonary nodules 01/01/2015  . OSA (obstructive sleep apnea) 08/03/2013  . COPD (chronic obstructive pulmonary disease) ([place]) 10/23/2012  . NEVUS, ATYPICAL 07/31/2010  . Eczema 07/31/2010  . LUNG NODULE 07/28/2010  . AMI 11/20/2009  . POSTSURG PERCUT TRANSLUMINAL COR ANGPLSTY STS 08/07/2009  . Hyperlipidemia 07/07/2009  . Bipolar disorder ([place]) 07/07/2009  . CAD, NATIVE VESSEL 07/07/2009  . ALCOHOL ABUSE 07/04/2009  . TOBACCO ABUSE 07/04/2009  . SYNCOPE 07/04/2009   Everardo All PT, DPT  01/21/21 3:00 PM   [facility] [address] [place], [place], [address] Phone: [phone]   Fax:  [phone]  Name: Jeffrey Hudson MRN: [id number] Date of Birth: [birth year]

## 2021-01-23 ENCOUNTER — Other Ambulatory Visit: Payer: Self-pay

## 2021-01-23 ENCOUNTER — Encounter: Payer: Self-pay | Admitting: Podiatry

## 2021-01-23 ENCOUNTER — Ambulatory Visit: Payer: Medicare Other | Admitting: Podiatry

## 2021-01-23 DIAGNOSIS — M79676 Pain in unspecified toe(s): Secondary | ICD-10-CM | POA: Diagnosis not present

## 2021-01-23 DIAGNOSIS — B351 Tinea unguium: Secondary | ICD-10-CM

## 2021-01-23 NOTE — Progress Notes (Signed)
This patient returns to the office for evaluation and treatment of long thick painful nails .  This patient is unable to trim his own nails since the patient cannot reach the feet.  Patient says the nails are painful walking and wearing his shoes. He returns for preventive foot care services.  General Appearance  Alert, conversant and in no acute stress.  Vascular  Dorsalis pedis and posterior tibial  pulses are palpable  bilaterally.  Capillary return is within normal limits  bilaterally. Temperature is within normal limits  bilaterally.  Neurologic  Senn-Weinstein monofilament wire test within normal limits  bilaterally. Muscle power within normal limits bilaterally.  Nails Thick disfigured discolored nails with subungual debris  from hallux to fifth toes bilaterally. No evidence of bacterial infection or drainage bilaterally.  Orthopedic  No limitations of motion  feet .  No crepitus or effusions noted.  No bony pathology or digital deformities noted. Palpable pain right heel.  Skin  normotropic skin with no porokeratosis noted bilaterally.  No signs of infections or ulcers noted.     Onychomycosis  Pain in toes right foot  Pain in toes left foot  Plantar fasciitis right heel  Debridement  of nails  1-5  B/L with a nail nipper.  Nails were then filed using a dremel tool with no incidents.  Prescribe plantar fascial brace large.  Heel pain resolved.  RTC  3 months    Gardiner Barefoot DPM

## 2021-01-28 ENCOUNTER — Ambulatory Visit: Payer: Medicare Other | Admitting: Internal Medicine

## 2021-01-28 ENCOUNTER — Ambulatory Visit (INDEPENDENT_AMBULATORY_CARE_PROVIDER_SITE_OTHER)
Admission: RE | Admit: 2021-01-28 | Discharge: 2021-01-28 | Disposition: A | Discharge status: Home / Self Care | Payer: Medicare Other | Source: Ambulatory Visit | Attending: Internal Medicine | Admitting: Internal Medicine

## 2021-01-28 ENCOUNTER — Other Ambulatory Visit: Payer: Self-pay

## 2021-01-28 ENCOUNTER — Encounter: Payer: Self-pay | Admitting: Internal Medicine

## 2021-01-28 VITALS — BP 118/70 | HR 85 | Ht 65.0 in | Wt 160.0 lb

## 2021-01-28 DIAGNOSIS — R0600 Dyspnea, unspecified: Secondary | ICD-10-CM | POA: Diagnosis not present

## 2021-01-28 DIAGNOSIS — R12 Heartburn: Secondary | ICD-10-CM | POA: Diagnosis not present

## 2021-01-28 DIAGNOSIS — R918 Other nonspecific abnormal finding of lung field: Secondary | ICD-10-CM | POA: Diagnosis not present

## 2021-01-28 DIAGNOSIS — Z1211 Encounter for screening for malignant neoplasm of colon: Secondary | ICD-10-CM | POA: Diagnosis not present

## 2021-01-28 DIAGNOSIS — R142 Eructation: Secondary | ICD-10-CM

## 2021-01-28 DIAGNOSIS — J449 Chronic obstructive pulmonary disease, unspecified: Secondary | ICD-10-CM | POA: Diagnosis not present

## 2021-01-28 NOTE — Patient Instructions (Signed)
You have been scheduled for an endoscopy and colonoscopy. Please follow the written instructions given to you at your visit today. Please pick up your prep supplies at the pharmacy within the next 1-3 days. If you use inhalers (even only as needed), please bring them with you on the day of your procedure.  Due to recent changes in healthcare laws, you may see the results of your imaging and laboratory studies on MyChart before your provider has had a chance to review them.  We understand that in some cases there may be results that are confusing or concerning to you. Not all laboratory results come back in the same time frame and the provider may be waiting for multiple results in order to interpret others.  Please give [place] 48 hours in order for your provider to thoroughly review all the results before contacting the office for clarification of your results.   Please go to the basement and get a chest x-ray today before leaving.  I appreciate the opportunity to care for you. Silvano Rusk, MD, [facility]

## 2021-01-28 NOTE — Progress Notes (Signed)
SHEMUEL HARKLEROAD 70 y.o. 15-Apr-1951 [id number] Referred by: Dorothyann Peng, NP  Assessment & Plan:   Encounter Diagnoses  Name Primary?  . Pyrosis Yes  . Belching   . Colon cancer screening   . Dyspnea, unspecified type   . Multiple pulmonary nodules     This is unusual I would have thought that if the Prilosec was causing the belching it would have stopped.  It might be a little better.  It has gone on about 10 days or so after stopping the medication.  Ordinarily would not do an EGD for belching but given that he had the uptake in heartburn and pyrosis issues and the belching and his age will do so.  Schedule screening colonoscopy also.  Given his lung issues and sometimes how they might be related to GI symptoms I am going to get a chest x-ray.  He may be turning into an aerophagic air Trapper type person and this is just how it has developed in some sort of coincidence perhaps.  I do tend to see people with emphysema like he has trapped digestive air as well and have excessive gas and even flatulence at times.  The risks and benefits as well as alternatives of endoscopic procedure(s) have been discussed and reviewed. All questions answered. The patient agrees to proceed.  I appreciate the opportunity to care for this patient. CC: Nafziger, Tommi Rumps, NP    Subjective:   Chief Complaint: Excessive belching  HPI The patient is a 70 year old man with severe COPD and pulmonary nodules who was having some pyrosis symptoms, few weeks ago he tried some over-the-counter antacids without help he saw Sallee Provencal NP and tried Prilosec for 14 days.  Halfway through the Prilosec treatment his pyrosis and heartburn symptoms resolved but he developed belching which was excessive.  Also some flatulence.  He saw where that could be a side effect he tried some Gas-X with no relief.  He saw Georgina Snell back again and he was told to finish his Prilosec which he did and several weeks have gone on now and  though his belching was a bit less today he is still having excessive belching that is disrupting sleep.  There is no dysphagia or unintentional weight loss.  There are some mild abdominal discomfort.  He has been avoiding onions Bell peppers and other foods that might trigger things to his knowledge.  Slight loss of appetite no early satiety no change in bowel habits.  Colonoscopy in 2002 for screening was negative none since.  He is interested in a screening colonoscopy again.  2 cups of coffee a day no real history of excessive carbonated beverages either.  He does continue to smoke.  He has no known history of air trapping or swallowing air or belching like this.  He gets annual chest CTs for lung cancer screening he has multiple nodules I looked at the images there is no hiatal hernia.  Overall lung issue seems stable though maybe a little more short of breath when he gets up in the morning. Allergies  Allergen Reactions  . Lisinopril Swelling    Angio edema   . Norvasc [Amlodipine] Swelling    Lower extremity    Current Meds  Medication Sig  . albuterol (PROAIR HFA) 108 (90 Base) MCG/ACT inhaler Inhale 2 puffs into the lungs every 6 (six) hours as needed.  Marland Kitchen aspirin 81 MG tablet Take 81 mg by mouth daily.  Marland Kitchen atorvastatin (LIPITOR) 80 MG tablet TAKE 1  TABLET BY MOUTH  DAILY AT 6 PM  . betamethasone dipropionate 0.05 % cream as needed.   . ezetimibe (ZETIA) 10 MG tablet Take 1 tablet (10 mg total) by mouth daily.  Marland Kitchen FLUoxetine (PROZAC) 20 MG capsule Take 1 capsule (20 mg total) by mouth daily.  . Glycopyrrolate-Formoterol (BEVESPI AEROSPHERE) 9-4.8 MCG/ACT AERO Inhale 2 puffs into the lungs 2 (two) times daily. (Patient taking differently: Inhale 2 puffs into the lungs as needed.)  . hydrochlorothiazide (MICROZIDE) 12.5 MG capsule Take 1 capsule (12.5 mg total) by mouth 2 (two) times daily.  . meloxicam (MOBIC) 7.5 MG tablet Take 7.5 mg by mouth as needed for pain.  . metFORMIN (GLUCOPHAGE)  500 MG tablet Take 1 tablet (500 mg total) by mouth daily with breakfast.  . metoprolol succinate (TOPROL-XL) 25 MG 24 hr tablet TAKE 1 TABLET BY MOUTH  DAILY.  . Multiple Vitamin (MULTIVITAMIN) tablet Take 1 tablet by mouth daily.  . nitroGLYCERIN (NITROSTAT) 0.4 MG SL tablet Place 0.4 mg under the tongue every 5 (five) minutes as needed for chest pain.  Marland Kitchen Omeprazole Magnesium (PRILOSEC PO) Take by mouth daily. OTC  . timolol (BETIMOL) 0.25 % ophthalmic solution Place 1-2 drops into both eyes daily.  . travoprost, benzalkonium, (TRAVATAN) 0.004 % ophthalmic solution Place 1 drop into both eyes at bedtime.  . Turmeric (QC TUMERIC COMPLEX PO) Take 1 capsule by mouth daily.   Past Medical History:  Diagnosis Date  . AMI (acute myocardial infarction) ([place])    Acute ST elevation  . Anxiety   . Arthritis    neck  . Bipolar disorder ([place])   . COPD (chronic obstructive pulmonary disease) ([place])   . Coronary artery disease    a. s/p anterolateral STEMI with BMS placed in large diagonal branch (2009).  Marland Kitchen Hyperlipidemia   . Hypertension   . Nodule of left lung    6-mm smooothly rounded nodule  . Plantar fasciitis    left foot  . Pre-diabetes   . Syncope and collapse    in 2001 and 2004 with no clear cause  . Tobacco abuse   . Viral URI with cough 11/22/2016   Past Surgical History:  Procedure Laterality Date  . COLONOSCOPY    . 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 - plays once a week    2 glasses of wine a day max, still smoking 2 cups of coffee a day no drug use or other tobacco   family history includes Alcohol abuse (age of onset: 48) in his father; Dementia (age of onset: 11) in his mother; Depression in his brother.   Review of Systems As above.  Decreased hearing, pedal edema  chronic dyspnea arthritis all other review of systems negative or as per HPI  Objective:   Physical Exam @BP  118/70   Pulse 85   Ht 5\' 5"  (1.651 m)   Wt 160 lb (72.6 kg)   BMI 26.63 kg/m @  General:  Well-developed, well-nourished and in no acute distress Eyes:  anicteric. Lungs: Diffusely decreased Heart:   Distant S1S2, no rubs, murmurs, gallops. Abdomen:  soft, non-tender, no hepatosplenomegaly, hernia, or mass and BS+.  Rectal: deferred Lymph:  no cervical or supraclavicular adenopathy. Extremities:  no edema, cyanosis  Skin   no rash. Neuro:  A&O x 3.  Psych:  appropriate mood and  Affect.   Data Reviewed:  See HPI

## 2021-02-03 ENCOUNTER — Other Ambulatory Visit (HOSPITAL_COMMUNITY): Payer: Self-pay | Admitting: Psychiatry

## 2021-02-11 ENCOUNTER — Ambulatory Visit: Payer: Medicare Other | Admitting: Physical Therapy

## 2021-02-11 ENCOUNTER — Other Ambulatory Visit: Payer: Self-pay

## 2021-02-11 ENCOUNTER — Encounter: Payer: Self-pay | Admitting: Physical Therapy

## 2021-02-11 DIAGNOSIS — M542 Cervicalgia: Secondary | ICD-10-CM

## 2021-02-11 DIAGNOSIS — R252 Cramp and spasm: Secondary | ICD-10-CM

## 2021-02-11 NOTE — Therapy (Signed)
[facility] [address], [place], [place], [address] Phone: [phone]   Fax:  [phone]  Physical Therapy Treatment  Patient Details  Name: Jeffrey Hudson MRN: [id number] Date of Birth: 24-Sep-1951 Referring Provider (PT): Dorothyann Peng, MD   Encounter Date: 02/11/2021   PT End of Session - 02/11/21 1048    Visit Number 2    Date for PT Re-Evaluation 03/04/21    Authorization Type UHC medicare    Authorization - Visit Number 2    Authorization - Number of Visits 12    Progress Note Due on Visit 10    PT Start Time 5974    PT Stop Time 1013    PT Time Calculation (min) 42 min    Activity Tolerance Patient tolerated treatment well;No increased pain    Behavior During Therapy WFL for tasks assessed/performed           Past Medical History:  Diagnosis Date  . AMI (acute myocardial infarction) ([place])    Acute ST elevation  . Anxiety   . Arthritis    neck  . Bipolar disorder ([place])   . COPD (chronic obstructive pulmonary disease) ([place])   . Coronary artery disease    a. s/p anterolateral STEMI with BMS placed in large diagonal branch (2009).  Marland Kitchen Hyperlipidemia   . Hypertension   . Nodule of left lung    6-mm smooothly rounded nodule  . Plantar fasciitis    left foot  . Pre-diabetes   . Syncope and collapse    in 2001 and 2004 with no clear cause  . Tobacco abuse   . Viral URI with cough 11/22/2016    Past Surgical History:  Procedure Laterality Date  . COLONOSCOPY    . PERCUTANEOUS CORONARY STENT INTERVENTION (PCI-S)      There were no vitals filed for this visit.   Subjective Assessment - 02/11/21 0931    Subjective Reports partial compliance with HEP.    Pertinent History HTN, COPD, hx tobacco and alcohol abuse    How long can you sit comfortably? unlimited    How long can you stand comfortably? unlimited    How long can you walk comfortably? unlimited    Patient Stated Goals to not have  pain; sleep through the night    Currently in Pain? Yes    Pain Score 2     Pain Orientation Right    Pain Descriptors / Indicators Aching    Pain Type Chronic pain    Pain Onset More than a month ago    Pain Frequency Constant                             [facility] Adult PT Treatment/Exercise - 02/11/21 0001      Neck Exercises: Supine   Neck Retraction 10 reps;3 secs      Shoulder Exercises: Seated   Horizontal ABduction Both;10 reps;Theraband    Theraband Level (Shoulder Horizontal ABduction) Level 2 (Red)    External Rotation Both;10 reps;Theraband    Theraband Level (Shoulder External Rotation) Level 2 (Red)      Shoulder Exercises: Prone   Other Prone Exercises row x10 repetitions bil UE      Shoulder Exercises: Standing   Row Both;10 reps;Theraband    Theraband Level (Shoulder Row) Level 3 (Green)    Other Standing Exercises low trap lift offs x 10 repetitions bil UE  Manual Therapy   Manual Therapy Soft tissue mobilization    Soft tissue mobilization skilled palpation and assessment of tissues before, during, and after dry needling      Neck Exercises: Stretches   Levator Stretch Right;Left;2 reps;20 seconds    Levator Stretch Limitations seated; max tactile and verbal cuing for form            Trigger Point Dry Needling - 02/11/21 0001    Consent Given? Yes    Education Handout Provided Previously provided    Muscles Treated Head and Neck Upper trapezius;Levator scapulae;Cervical multifidi;Semispinalis capitus    Dry Needling Comments Rt and Lt    Upper Trapezius Response Twitch reponse elicited;Palpable increased muscle length    Levator Scapulae Response Twitch response elicited;Palpable increased muscle length    Semispinalis capitus Response Palpable increased muscle length    Cervical multifidi Response Palpable increased muscle length                PT Education - 02/11/21 1008    Education Details supine cervical  retraction, lower trap setting    Person(s) Educated Patient    Methods Explanation;Demonstration;Tactile cues;Verbal cues;Handout    Comprehension Verbalized understanding;Returned demonstration;Verbal cues required;Tactile cues required;Need further instruction            PT Short Term Goals - 02/11/21 1046      PT SHORT TERM GOAL #1   Title Patient will be independent with HEP for continued progression at home.    Time 3    Period Weeks    Status On-going   Patient reports partial compliance   Target Date 02/11/21             PT Long Term Goals - 01/21/21 1100      PT LONG TERM GOAL #1   Title FOTO goal    Time 6    Period Weeks    Status New    Target Date 03/04/21      PT LONG TERM GOAL #2   Title Patient will be independent with advanced HEP for long term management of symptoms post D/C.    Time 6    Period Weeks    Status New    Target Date 03/04/21      PT LONG TERM GOAL #3   Title Patient will report no more than one instace of waking due to pain for two consecutive weeks for improved sleep hygiene    Time 6    Period Weeks    Status New    Target Date 03/04/21                 Plan - 02/11/21 1031    Clinical Impression Statement Patient reporting for first visit after initial evaluation. Patient presents reporting partial compliance with HEP. States that neck pain is mostly Rt sided this date. Patient requiring frequent tactile and verbal cuing for decreased scapular elevation with all tband exercises and stretches this date. Max tactile cuing provided during levator stretch as patient requiring assist for dissociation of cervical and thoracic movement. Bilateral twitch response of upper trap elicited with dry needling. Would benefit from continued skilled intervention for decreased pain and improved activity tolerance.    Personal Factors and Comorbidities Comorbidity 3+    Comorbidities HTN, COPD, and history of alcohol and tobacco abuse     Examination-Activity Limitations Sleep    Examination-Participation Restrictions Personal Finances    Rehab Potential Excellent    PT Frequency 2x / week  PT Duration 6 weeks    PT Treatment/Interventions ADLs/Self Care Home Management;Electrical Stimulation;Cryotherapy;Iontophoresis 4mg /ml Dexamethasone;Moist Heat;Traction;Ultrasound;Therapeutic activities;Therapeutic exercise;Neuromuscular re-education;Patient/family education;Manual techniques;Passive range of motion;Dry needling;Taping;Spinal Manipulations;Joint Manipulations    PT Next Visit Plan assess response to DN; continue postural strengthening and cervical/thoracic mobility    PT Home Exercise Plan Access Code [email or web address]    Consulted and Agree with Plan of Care Patient           Patient will benefit from skilled therapeutic intervention in order to improve the following deficits and impairments:  Decreased activity tolerance,Decreased mobility,Decreased range of motion,Decreased strength,Hypomobility,Increased fascial restricitons,Increased muscle spasms,Impaired flexibility,Postural dysfunction,Pain  Visit Diagnosis: Cramp and spasm  Cervicalgia     Problem List Patient Active Problem List   Diagnosis Date Noted  . Plantar fasciitis of left foot 09/03/2020  . Essential hypertension 05/06/2016  . Multiple pulmonary nodules 01/01/2015  . OSA (obstructive sleep apnea) 08/03/2013  . COPD (chronic obstructive pulmonary disease) ([place]) 10/23/2012  . NEVUS, ATYPICAL 07/31/2010  . Eczema 07/31/2010  . LUNG NODULE 07/28/2010  . AMI 11/20/2009  . POSTSURG PERCUT TRANSLUMINAL COR ANGPLSTY STS 08/07/2009  . Hyperlipidemia 07/07/2009  . Bipolar disorder ([place]) 07/07/2009  . CAD, NATIVE VESSEL 07/07/2009  . ALCOHOL ABUSE 07/04/2009  . TOBACCO ABUSE 07/04/2009  . SYNCOPE 07/04/2009    Everardo All PT, DPT  02/11/21 11:01 AM    [facility] [address] [place], [place], [address] Phone: [phone]   Fax:  [phone]  Name: Jeffrey Hudson MRN: [id number] Date of Birth: 1951-05-21

## 2021-02-11 NOTE — Patient Instructions (Signed)
Supine Cervical Retraction with Towel - 1 x daily - 7 x weekly - 2 sets - 10 reps Low Trap Setting at Man - 1 x daily - 7 x weekly - 2 sets - 10 reps

## 2021-02-13 ENCOUNTER — Ambulatory Visit: Payer: Medicare Other | Admitting: Physical Therapy

## 2021-02-13 ENCOUNTER — Other Ambulatory Visit: Payer: Self-pay

## 2021-02-13 DIAGNOSIS — M542 Cervicalgia: Secondary | ICD-10-CM | POA: Diagnosis not present

## 2021-02-13 DIAGNOSIS — R252 Cramp and spasm: Secondary | ICD-10-CM | POA: Diagnosis not present

## 2021-02-13 NOTE — Therapy (Signed)
[facility] [address] [place], [place], [address] Phone: [phone]   Fax:  [phone]  Physical Therapy Treatment  Patient Details  Name: Jeffrey Hudson MRN: [id number] Date of Birth: [birth year] Referring Provider (PT): Dorothyann Peng, MD   Encounter Date: 02/13/2021   PT End of Session - 02/13/21 1042    Visit Number 3    Number of Visits 12    Date for PT Re-Evaluation 03/04/21    Authorization Type UHC medicare    Authorization - Visit Number 3    Progress Note Due on Visit 10    PT Start Time 0927    PT Stop Time 1019    PT Time Calculation (min) 52 min    Activity Tolerance Patient tolerated treatment well           Past Medical History:  Diagnosis Date  . AMI ([place] myocardial infarction) ([place])    Acute ST elevation  . Anxiety   . Arthritis    neck  . Bipolar disorder ([place])   . COPD (chronic obstructive pulmonary disease) ([place])   . Coronary artery disease    a. s/p anterolateral STEMI with BMS placed in large diagonal branch (2009).  Marland Kitchen Hyperlipidemia   . Hypertension   . Nodule of left lung    6-mm smooothly rounded nodule  . Plantar fasciitis    left foot  . Pre-diabetes   . Syncope and collapse    in 2001 and 2004 with no clear cause  . Tobacco abuse   . Viral URI with cough 11/22/2016    Past Surgical History:  Procedure Laterality Date  . COLONOSCOPY    . PERCUTANEOUS CORONARY STENT INTERVENTION (PCI-S)      There were no vitals filed for this visit.   Subjective Assessment - 02/13/21 0925    Subjective My neck is good after the needling but I had severe 8/10 pain in a spot on the back of the shoulder (supraspinatus region).  Had it in the past but not this intense.    Patient Stated Goals to not have pain; sleep through the night    Currently in Pain? Yes    Pain Score 2     Pain Location Neck    Pain Orientation Right    Pain Type Chronic pain    Multiple Pain Sites  Yes    Pain Score 5    Pain Location Shoulder    Pain Orientation Right    Pain Type Chronic pain                             [facility] Adult PT Treatment/Exercise - 02/13/21 0001      Self-Care   Other Self-Care Comments  long discussion on dry needling and physiology of effects of exercise in relieving pain      Shoulder Exercises: Standing   Row Both;10 reps;Theraband    Theraband Level (Shoulder Row) Level 3 (Green)    Other Standing Exercises green band row and rotate 5x right/left hooked on doorknob    Other Standing Exercises green band bil shoulder extension green band 15x over the top of the door      Manual Therapy   Joint Mobilization Cervical distraction, C4-T1 PA and lateral mobs grade 2/3    Soft tissue mobilization supraspinatus, infraspinatus    Scapular Mobilization medial and lateral scapular mobs    Kinesiotex Inhibit Muscle  Kinesiotix   Inhibit Muscle  I strip 50% stretch over supraspinatus and levator                  PT Education - 02/13/21 1042    Education Details give green band for home    Person(s) Educated Patient    Comprehension Returned demonstration            PT Short Term Goals - 02/11/21 1046      PT SHORT TERM GOAL #1   Title Patient will be independent with HEP for continued progression at home.    Time 3    Period Weeks    Status On-going   Patient reports partial compliance   Target Date 02/11/21             PT Long Term Goals - 01/21/21 1100      PT LONG TERM GOAL #1   Title FOTO goal    Time 6    Period Weeks    Status New    Target Date 03/04/21      PT LONG TERM GOAL #2   Title Patient will be independent with advanced HEP for long term management of symptoms post D/C.    Time 6    Period Weeks    Status New    Target Date 03/04/21      PT LONG TERM GOAL #3   Title Patient will report no more than one instace of waking due to pain for two consecutive weeks for improved sleep  hygiene    Time 6    Period Weeks    Status New    Target Date 03/04/21                 Plan - 02/13/21 1043    Clinical Impression Statement The patient reports his neck pain is low but he has had increased pain in a spot at the top of his scapular spine over the last 2 days.  Good response to manual therapy including cervical joint mobs, soft tissue mobilization, scapular mobs and kinesiotape with some relief reported post treatment session to this new area of pain.   Encouraged regular performance of HEP for best response and long term prognosis.  Therapist monitoring response with all treatment interventions.    Rehab Potential Excellent    PT Frequency 2x / week    PT Duration 6 weeks    PT Treatment/Interventions ADLs/Self Care Home Management;Electrical Stimulation;Cryotherapy;Iontophoresis 4mg /ml Dexamethasone;Moist Heat;Traction;Ultrasound;Therapeutic activities;Therapeutic exercise;Neuromuscular re-education;Patient/family education;Manual techniques;Passive range of motion;Dry needling;Taping;Spinal Manipulations;Joint Manipulations    PT Next Visit Plan manual therapy;KT;  continue postural strengthening and cervical/thoracic mobility; possible DN #2?    PT Home Exercise Plan Access Code [email or web address] pt given green band for home          Patient will benefit from skilled therapeutic intervention in order to improve the following deficits and impairments:  Decreased activity tolerance,Decreased mobility,Decreased range of motion,Decreased strength,Hypomobility,Increased fascial restricitons,Increased muscle spasms,Impaired flexibility,Postural dysfunction,Pain  Visit Diagnosis: Cramp and spasm  Cervicalgia     Problem List Patient Active Problem List   Diagnosis Date Noted  . Plantar fasciitis of left foot 09/03/2020  . Essential hypertension 05/06/2016  . Multiple pulmonary nodules 01/01/2015  . OSA (obstructive sleep apnea) 08/03/2013  . COPD (chronic obstructive  pulmonary disease) ([place]) 10/23/2012  . NEVUS, ATYPICAL 07/31/2010  . Eczema 07/31/2010  . LUNG NODULE 07/28/2010  . AMI 11/20/2009  . POSTSURG PERCUT TRANSLUMINAL COR ANGPLSTY STS 08/07/2009  .  Hyperlipidemia 07/07/2009  . Bipolar disorder ([place]) 07/07/2009  . CAD, NATIVE VESSEL 07/07/2009  . ALCOHOL ABUSE 07/04/2009  . TOBACCO ABUSE 07/04/2009  . SYNCOPE 07/04/2009   Ruben Im, PT 02/13/21 10:50 AM Phone: [phone] Fax: [phone] Alvera Singh 02/13/2021, 10:49 AM  [facility] [address], [place], [place], [address] Phone: [phone]   Fax:  [phone]  Name: Jeffrey [place] MRN: [id number] Date of Birth: 1951-02-15

## 2021-02-17 ENCOUNTER — Encounter: Payer: Self-pay | Admitting: Physical Therapy

## 2021-02-17 ENCOUNTER — Encounter: Payer: Medicare Other | Admitting: Physical Therapy

## 2021-02-17 ENCOUNTER — Ambulatory Visit: Payer: Medicare Other | Attending: Adult Health | Admitting: Physical Therapy

## 2021-02-17 ENCOUNTER — Other Ambulatory Visit: Payer: Self-pay

## 2021-02-17 DIAGNOSIS — M542 Cervicalgia: Secondary | ICD-10-CM | POA: Diagnosis not present

## 2021-02-17 DIAGNOSIS — R252 Cramp and spasm: Secondary | ICD-10-CM

## 2021-02-17 NOTE — Therapy (Signed)
[facility] [address], [place], [place], [address] Phone: [phone]   Fax:  [phone]  Physical Therapy Treatment  Patient Details  Name: Jeffrey Hudson MRN: [id number] Date of Birth: 08/20/1951 Referring Provider (PT): Dorothyann Peng, MD   Encounter Date: 02/17/2021   PT End of Session - 02/17/21 0948    Visit Number 4    Number of Visits 12    Date for PT Re-Evaluation 03/04/21    Authorization Type [place] - Visit Number 4    Authorization - Number of Visits 12    Progress Note Due on Visit 10    PT Start Time 0845    PT Stop Time 0927    PT Time Calculation (min) 42 min    Activity Tolerance Patient tolerated treatment well;No increased pain    Behavior During Therapy WFL for tasks assessed/performed           Past Medical History:  Diagnosis Date  . AMI (acute myocardial infarction) ([place])    Acute ST elevation  . Anxiety   . Arthritis    neck  . Bipolar disorder ([place])   . COPD (chronic obstructive pulmonary disease) ([place])   . Coronary artery disease    a. s/p anterolateral STEMI with BMS placed in large diagonal branch (2009).  Marland Kitchen Hyperlipidemia   . Hypertension   . Nodule of left lung    6-mm smooothly rounded nodule  . Plantar fasciitis    left foot  . Pre-diabetes   . Syncope and collapse    in 2001 and 2004 with no clear cause  . Tobacco abuse   . Viral URI with cough 11/22/2016    Past Surgical History:  Procedure Laterality Date  . COLONOSCOPY    . PERCUTANEOUS CORONARY STENT INTERVENTION (PCI-S)      There were no vitals filed for this visit.   Subjective Assessment - 02/17/21 0847    Subjective Reports point tenderness over Rt spine of scapula. Feels that taping helped from last session.    Pertinent History HTN, COPD, hx tobacco and alcohol abuse    How long can you sit comfortably? unlimited    How long can you stand comfortably? unlimited     How long can you walk comfortably? unlimited    Patient Stated Goals to not have pain; sleep through the night    Currently in Pain? Yes    Pain Score 5     Pain Location Scapula    Pain Orientation Right    Pain Descriptors / Indicators Aching    Pain Type Chronic pain;Acute pain    Pain Onset In the past 7 days    Pain Frequency Constant              [facility] PT Assessment - 02/17/21 0001      Observation/Other Assessments   Focus on Therapeutic Outcomes (FOTO)  53      Special Tests    Special Tests Rotator Cuff Impingement    Rotator Cuff Impingment tests Michel Bickers test;Empty Can test;Full Can test      Hawkins-Kennedy test   Findings Positive    Side Right      Empty Can test   Findings Positive    Side Right      Full Can test   Findings Negative    Side Right  [place] Adult PT Treatment/Exercise - 02/17/21 0001      Shoulder Exercises: Supine   Protraction Right;AROM;10 reps      Shoulder Exercises: Prone   Other Prone Exercises row x10 repetitions Rt UE only      Shoulder Exercises: Standing   Extension Both;12 reps;Theraband    Theraband Level (Shoulder Extension) Level 3 (Green)    Row Both;12 reps;Theraband    Theraband Level (Shoulder Row) Level 3 (Green)    Other Standing Exercises green band row and rotate 10x right/left      Manual Therapy   Manual Therapy Joint mobilization;Soft tissue mobilization;Scapular mobilization;Manual Traction    Joint Mobilization PAs Grade I-II C5-T5    Soft tissue mobilization STM to Rt supraspinatus, levator scap, infraspinatus, rhomboid minor, cervical paraspinals    Scapular Mobilization medial and lateral scapular mobs    Manual Traction manual cervical traction    Kinesiotex Inhibit Muscle      Kinesiotix   Inhibit Muscle  I strip 50% stretch over supraspinatus and levator                  PT Education - 02/17/21 0952    Education Details removed levator  scap stretch; added supine scapular protraction    Person(s) Educated Patient    Methods Explanation;Demonstration;Tactile cues;Verbal cues;Handout    Comprehension Verbalized understanding;Returned demonstration;Verbal cues required;Tactile cues required            PT Short Term Goals - 02/17/21 0943      PT SHORT TERM GOAL #1   Title Patient will be independent with HEP for continued progression at home.    Time 3    Period Weeks    Status On-going   Reports partial compliance; but does not perform daily   Target Date 02/11/21      PT SHORT TERM GOAL #2   Title Patient will demonstrate 60 degrees cervical rotation bilaterally to indicate improved mobility for ADLs    Time 3    Period Weeks    Status On-going    Target Date 02/11/21             PT Long Term Goals - 02/17/21 0942      PT LONG TERM GOAL #1   Title Patient will improve FOTO score to 64 or higher to indicate improved overall function.    Baseline 53    Time 6    Period Weeks    Status New                 Plan - 02/17/21 0949    Clinical Impression Statement Patient continues to report point tenderness over Rt supraspinatus, levator scap insertion, and rhomboid minor insertion. Continues to require frequent tactile and verbal cuing to inhibit scapular elevation when performing scapular retraction. Empty can and Hawkins-Kennedy positive for Rt shoulder indicating possible rotator cuff impingement. Patient reporting decreased pain at end of session. Would benefit from continue skilled intervention to address impairments for decreased pain and improved functional use of Rt UE.    Personal Factors and Comorbidities Comorbidity 3+    Comorbidities HTN, COPD, and history of alcohol and tobacco abuse    Examination-Activity Limitations Sleep    Examination-Participation Restrictions Personal Finances    Rehab Potential Excellent    PT Frequency 2x / week    PT Duration 6 weeks    PT  Treatment/Interventions ADLs/Self Care Home Management;Electrical Stimulation;Cryotherapy;Iontophoresis 4mg /ml Dexamethasone;Moist Heat;Traction;Ultrasound;Therapeutic activities;Therapeutic exercise;Neuromuscular re-education;Patient/family education;Manual techniques;Passive range of motion;Dry needling;Taping;Spinal  Manipulations;Joint Manipulations    PT Next Visit Plan continue manual techniques and KT as needed; focus on scapular depression and mechanics; inhibition of scapular elevators    PT Home Exercise Plan Access Code [email or web address]    Consulted and Agree with Plan of Care Patient           Patient will benefit from skilled therapeutic intervention in order to improve the following deficits and impairments:  Decreased activity tolerance,Decreased mobility,Decreased range of motion,Decreased strength,Hypomobility,Increased fascial restricitons,Increased muscle spasms,Impaired flexibility,Postural dysfunction,Pain  Visit Diagnosis: Cramp and spasm  Cervicalgia     Problem List Patient Active Problem List   Diagnosis Date Noted  . Plantar fasciitis of left foot 09/03/2020  . Essential hypertension 05/06/2016  . Multiple pulmonary nodules 01/01/2015  . OSA (obstructive sleep apnea) 08/03/2013  . COPD (chronic obstructive pulmonary disease) ([place]) 10/23/2012  . NEVUS, ATYPICAL 07/31/2010  . Eczema 07/31/2010  . LUNG NODULE 07/28/2010  . AMI 11/20/2009  . POSTSURG PERCUT TRANSLUMINAL COR ANGPLSTY STS 08/07/2009  . Hyperlipidemia 07/07/2009  . Bipolar disorder ([place]) 07/07/2009  . CAD, NATIVE VESSEL 07/07/2009  . ALCOHOL ABUSE 07/04/2009  . TOBACCO ABUSE 07/04/2009  . SYNCOPE 07/04/2009   Everardo All PT, DPT  02/17/21 9:53 AM   [facility] [address] [place], [place], [address] Phone: [phone]   Fax:  [phone]  Name: Jeffrey Hudson MRN: [id number] Date of Birth: September 06, 1951

## 2021-02-17 NOTE — Patient Instructions (Signed)
Supine Single Arm Shoulder Protraction - 1 x daily - 7 x weekly - 1 sets - 15 reps

## 2021-02-19 ENCOUNTER — Ambulatory Visit: Payer: Medicare Other | Admitting: Physical Therapy

## 2021-02-19 ENCOUNTER — Other Ambulatory Visit: Payer: Self-pay

## 2021-02-19 DIAGNOSIS — M542 Cervicalgia: Secondary | ICD-10-CM

## 2021-02-19 DIAGNOSIS — R252 Cramp and spasm: Secondary | ICD-10-CM | POA: Diagnosis not present

## 2021-02-19 NOTE — Therapy (Signed)
[facility] [address] [place], [place], [address] Phone: [phone]   Fax:  [phone]  Physical Therapy Treatment  Patient Details  Name: Jeffrey Hudson MRN: [id number] Date of Birth: 1951-05-09 Referring Provider (PT): Dorothyann Peng, MD   Encounter Date: 02/19/2021   PT End of Session - 02/19/21 1745    Visit Number 5    Number of Visits 12    Date for PT Re-Evaluation 03/04/21    Authorization Type [place] - Visit Number 5    Authorization - Number of Visits 12    Progress Note Due on Visit 10    PT Start Time 0930    PT Stop Time 1013    PT Time Calculation (min) 43 min    Activity Tolerance Patient tolerated treatment well           Past Medical History:  Diagnosis Date  . AMI (acute myocardial infarction) ([place])    Acute ST elevation  . Anxiety   . Arthritis    neck  . Bipolar disorder ([place])   . COPD (chronic obstructive pulmonary disease) ([place])   . Coronary artery disease    a. s/p anterolateral STEMI with BMS placed in large diagonal branch (2009).  Marland Kitchen Hyperlipidemia   . Hypertension   . Nodule of left lung    6-mm smooothly rounded nodule  . Plantar fasciitis    left foot  . Pre-diabetes   . Syncope and collapse    in 2001 and 2004 with no clear cause  . Tobacco abuse   . Viral URI with cough 11/22/2016    Past Surgical History:  Procedure Laterality Date  . COLONOSCOPY    . PERCUTANEOUS CORONARY STENT INTERVENTION (PCI-S)      There were no vitals filed for this visit.   Subjective Assessment - 02/19/21 0931    Subjective That spot is feeling better.  My neck still hurts but is better.  Tape helps.  I feel good when I leave here.  I'm gun-shy about doing DN again.    Currently in Pain? Yes    Pain Score 3     Multiple Pain Sites Yes    Pain Score 2              [facility] PT Assessment - 02/19/21 0001      AROM   Cervical - Right Rotation 56     Cervical - Left Rotation 50                         [facility] Adult PT Treatment/Exercise - 02/19/21 0001      Self-Care   Other Self-Care Comments  discussion of pillow and alignment to avoid excessive head flexion      Neck Exercises: Standing   Other Standing Exercises snow angels 5x   painful arc   Other Standing Exercises back to wall bil UE elevation 8x      Neck Exercises: Seated   Other Seated Exercise cervical rotation with overpressure daily      Neck Exercises: Supine   Other Supine Exercise foam roll rotation and nods 10x each      Shoulder Exercises: Standing   Other Standing Exercises wall ex: UE elevation, snow angels, push ups 5-8x each    Other Standing Exercises light green loop scoops 8x; wall clocks 5x green loop      Shoulder Exercises: ROM/Strengthening  Pushups 10 reps    Pushups Limitations with a plus      Manual Therapy   Joint Mobilization GH joint distraction, inferior and posterior mobs grade 2-3 and mobs with movement    Soft tissue mobilization STM to Rt supraspinatus, levator scap, infraspinatus, rhomboid minor, cervical paraspinals    Scapular Mobilization medial and lateral scapular mobs                    PT Short Term Goals - 02/19/21 1751      PT SHORT TERM GOAL #1   Title Patient will be independent with HEP for continued progression at home.    Status Achieved      PT SHORT TERM GOAL #2   Title Patient will demonstrate 60 degrees cervical rotation bilaterally to indicate improved mobility for ADLs    Time 3    Period Weeks    Status Partially Met             PT Long Term Goals - 02/17/21 2025      PT LONG TERM GOAL #1   Title Patient will improve FOTO score to 64 or higher to indicate improved overall function.    Baseline 53    Time 6    Period Weeks    Status New                 Plan - 02/19/21 1746    Clinical Impression Statement The patient reports low level discomfort along the  scapular spine and cervical spine regions but low in intensity.  Continuation of scapular and rotator cuff stability ex's but with low repetition for pain modulation.  Improving cervical ROM overall but with limitation with rotation needed for driving.  Therapist monitoring response and modifying as needed.    Rehab Potential Excellent    PT Frequency 2x / week    PT Duration 6 weeks    PT Treatment/Interventions ADLs/Self Care Home Management;Electrical Stimulation;Cryotherapy;Iontophoresis 4mg /ml Dexamethasone;Moist Heat;Traction;Ultrasound;Therapeutic activities;Therapeutic exercise;Neuromuscular re-education;Patient/family education;Manual techniques;Passive range of motion;Dry needling;Taping;Spinal Manipulations;Joint Manipulations    PT Next Visit Plan recheck cervical and shoulder ROM  next week;  continue manual techniques and KT as needed; focus on scapular depression and mechanics; inhibition of scapular elevators;  hold DN    PT Home Exercise Plan Access Code [email or web address]    Consulted and Agree with Plan of Care Patient           Patient will benefit from skilled therapeutic intervention in order to improve the following deficits and impairments:  Decreased activity tolerance,Decreased mobility,Decreased range of motion,Decreased strength,Hypomobility,Increased fascial restricitons,Increased muscle spasms,Impaired flexibility,Postural dysfunction,Pain  Visit Diagnosis: Cramp and spasm  Cervicalgia     Problem List Patient Active Problem List   Diagnosis Date Noted  . Plantar fasciitis of left foot 09/03/2020  . Essential hypertension 05/06/2016  . Multiple pulmonary nodules 01/01/2015  . OSA (obstructive sleep apnea) 08/03/2013  . COPD (chronic obstructive pulmonary disease) ([place]) 10/23/2012  . NEVUS, ATYPICAL 07/31/2010  . Eczema 07/31/2010  . LUNG NODULE 07/28/2010  . AMI 11/20/2009  . POSTSURG PERCUT TRANSLUMINAL COR ANGPLSTY STS 08/07/2009  . Hyperlipidemia 07/07/2009   . Bipolar disorder ([place]) 07/07/2009  . CAD, NATIVE VESSEL 07/07/2009  . ALCOHOL ABUSE 07/04/2009  . TOBACCO ABUSE 07/04/2009  . SYNCOPE 07/04/2009   Jeffrey Hudson, PT 02/19/21 5:55 PM Phone: [phone] Fax: [phone] Jeffrey Hudson 02/19/2021, 5:55 PM  [facility] [address], [place], [place],  [address] Phone: [phone]   Fax:  [phone]  Name: Jeffrey Hudson MRN: [id number] Date of Birth: [birth year]

## 2021-02-24 ENCOUNTER — Ambulatory Visit: Payer: Medicare Other | Admitting: Physical Therapy

## 2021-02-24 ENCOUNTER — Other Ambulatory Visit: Payer: Self-pay

## 2021-02-24 DIAGNOSIS — R252 Cramp and spasm: Secondary | ICD-10-CM

## 2021-02-24 DIAGNOSIS — M542 Cervicalgia: Secondary | ICD-10-CM | POA: Diagnosis not present

## 2021-02-24 NOTE — Therapy (Signed)
[facility] [address] [place], [place], [address] Phone: [phone]   Fax:  [phone]  Physical Therapy Treatment  Patient Details  Name: Jeffrey Hudson MRN: [id number] Date of Birth: 08-15-1951 Referring Provider (PT): Dorothyann Peng, MD   Encounter Date: 02/24/2021   PT End of Session - 02/24/21 1112    Visit Number 6    Number of Visits 12    Date for PT Re-Evaluation 03/04/21    Authorization Type [place] - Visit Number 6    Authorization - Number of Visits 12    Progress Note Due on Visit 10    PT Start Time 0932    PT Stop Time 1015    PT Time Calculation (min) 43 min    Activity Tolerance Patient tolerated treatment well    Behavior During Therapy [facility] for tasks assessed/performed           Past Medical History:  Diagnosis Date  . AMI (acute myocardial infarction) ([place])    Acute ST elevation  . Anxiety   . Arthritis    neck  . Bipolar disorder ([place])   . COPD (chronic obstructive pulmonary disease) ([place])   . Coronary artery disease    a. s/p anterolateral STEMI with BMS placed in large diagonal branch (2009).  Marland Kitchen Hyperlipidemia   . Hypertension   . Nodule of left lung    6-mm smooothly rounded nodule  . Plantar fasciitis    left foot  . Pre-diabetes   . Syncope and collapse    in 2001 and 2004 with no clear cause  . Tobacco abuse   . Viral URI with cough 11/22/2016    Past Surgical History:  Procedure Laterality Date  . COLONOSCOPY    . PERCUTANEOUS CORONARY STENT INTERVENTION (PCI-S)      There were no vitals filed for this visit.   Subjective Assessment - 02/24/21 1108    Subjective Continues to have low level Rt suprascapular discomfort. Had increased pain upon working up this morning but that has since resolved.    Pertinent History HTN, COPD, hx tobacco and alcohol abuse    How long can you sit comfortably? unlimited    How long can you stand  comfortably? unlimited    How long can you walk comfortably? unlimited    Patient Stated Goals to not have pain; sleep through the night    Currently in Pain? Yes    Pain Location Scapula    Pain Orientation Right    Pain Descriptors / Indicators Discomfort    Pain Type Chronic pain;Acute pain    Pain Onset 1 to 4 weeks ago    Pain Frequency Constant                             [facility] Adult PT Treatment/Exercise - 02/24/21 0001      Neck Exercises: Standing   Wall Push Ups 10 reps    Wall Push Ups Limitations verbal cues for decreased neutral cervical spine    Other Standing Exercises snow angels 5x    Other Standing Exercises lower trap lift offs x 10 repetitions      Shoulder Exercises: Standing   Protraction Both;10 reps    Protraction Limitations with wall slide    Horizontal ABduction Both;10 reps    Horizontal ABduction Limitations bil UE at 90/90; verbal cues for decreased scapular elevation  Other Standing Exercises blue loop scoops 10x      Manual Therapy   Manual Therapy Joint mobilization;Soft tissue mobilization;Manual Traction    Joint Mobilization PAs Grade I-IV C    Soft tissue mobilization Rt cervical paraspinals, upper trap, levator scap    Manual Traction manual cervical traction with suboccipital release                  PT Education - 02/24/21 1108    Education Details seated thoracic extension at wall or table    Person(s) Educated Patient    Methods Explanation;Demonstration;Tactile cues;Verbal cues;Handout    Comprehension Verbalized understanding;Returned demonstration;Verbal cues required;Tactile cues required            PT Short Term Goals - 02/24/21 0956      PT SHORT TERM GOAL #1   Title Patient will be independent with HEP for continued progression at home.    Time 3    Period Weeks    Status Achieved    Target Date 02/11/21      PT SHORT TERM GOAL #2   Title Patient will demonstrate 60 degrees cervical  rotation bilaterally to indicate improved mobility for ADLs    Time 3    Period Weeks    Status Partially Met   Rt 63; Lt 50   Target Date 02/11/21             PT Long Term Goals - 02/17/21 0942      PT LONG TERM GOAL #1   Title Patient will improve FOTO score to 64 or higher to indicate improved overall function.    Baseline 53    Time 6    Period Weeks    Status New                 Plan - 02/24/21 1109    Clinical Impression Statement Patient continues to exhibit impaired Lt cervical AROM. Requiring verbal and tactile cues to dissociate thoracic from cervical rotation. Demonstrating excessive cervical protraction during wall push ups and requiring verbal and tactile cues for correction. Would benefit from continued intervention for decreased pain, improved sleep hygiene, and improved functional use of Rt UE.    Personal Factors and Comorbidities Comorbidity 3+    Comorbidities HTN, COPD, and history of alcohol and tobacco abuse    Examination-Activity Limitations Sleep    Examination-Participation Restrictions Personal Finances    Rehab Potential Excellent    PT Frequency 2x / week    PT Duration 6 weeks    PT Treatment/Interventions ADLs/Self Care Home Management;Electrical Stimulation;Cryotherapy;Iontophoresis 70m/ml Dexamethasone;Moist Heat;Traction;Ultrasound;Therapeutic activities;Therapeutic exercise;Neuromuscular re-education;Patient/family education;Manual techniques;Passive range of motion;Dry needling;Taping;Spinal Manipulations;Joint Manipulations    PT Next Visit Plan continue to address cervical and thoracic mobility; scapular retractors and depressors; hold DN    PT Home Exercise Plan Access Code [email or web address]    Consulted and Agree with Plan of Care Patient           Patient will benefit from skilled therapeutic intervention in order to improve the following deficits and impairments:  Decreased activity tolerance,Decreased mobility,Decreased range of  motion,Decreased strength,Hypomobility,Increased fascial restricitons,Increased muscle spasms,Impaired flexibility,Postural dysfunction,Pain  Visit Diagnosis: Cramp and spasm  Cervicalgia     Problem List Patient Active Problem List   Diagnosis Date Noted  . Plantar fasciitis of left foot 09/03/2020  . Essential hypertension 05/06/2016  . Multiple pulmonary nodules 01/01/2015  . OSA (obstructive sleep apnea) 08/03/2013  . COPD (chronic obstructive pulmonary disease) ([place] 10/23/2012  .  NEVUS, ATYPICAL 07/31/2010  . Eczema 07/31/2010  . LUNG NODULE 07/28/2010  . AMI 11/20/2009  . POSTSURG PERCUT TRANSLUMINAL COR ANGPLSTY STS 08/07/2009  . Hyperlipidemia 07/07/2009  . Bipolar disorder ([place]) 07/07/2009  . CAD, NATIVE VESSEL 07/07/2009  . ALCOHOL ABUSE 07/04/2009  . TOBACCO ABUSE 07/04/2009  . SYNCOPE 07/04/2009   Everardo All PT, DPT  02/24/21 11:16 AM    [facility] [address], [place], [place], [address] Phone: [phone]   Fax:  [phone]  Name: Jeffrey Hudson MRN: [id number] Date of Birth: 1951-08-25

## 2021-02-24 NOTE — Patient Instructions (Signed)
Seated Thoracic Extension at Wall - 2 x daily - 7 x weekly - 1 sets - 2 reps - 20s hold

## 2021-02-26 ENCOUNTER — Other Ambulatory Visit: Payer: Self-pay

## 2021-02-26 ENCOUNTER — Ambulatory Visit: Payer: Medicare Other | Admitting: Physical Therapy

## 2021-02-26 DIAGNOSIS — M542 Cervicalgia: Secondary | ICD-10-CM

## 2021-02-26 DIAGNOSIS — R252 Cramp and spasm: Secondary | ICD-10-CM | POA: Diagnosis not present

## 2021-02-26 NOTE — Therapy (Signed)
[facility] [address], [place], [place], [address] Phone: [phone]   Fax:  [phone]  Physical Therapy Treatment  Patient Details  Name: Jeffrey Hudson MRN: [id number] Date of Birth: 09/02/1951 Referring Provider (PT): Dorothyann Peng, MD   Encounter Date: 02/26/2021   PT End of Session - 02/26/21 1132    PT Start Time 0930    PT Stop Time 1011    PT Time Calculation (min) 41 min    Activity Tolerance Patient tolerated treatment well;No increased pain    Behavior During Therapy WFL for tasks assessed/performed           Past Medical History:  Diagnosis Date  . AMI (acute myocardial infarction) ([place])    Acute ST elevation  . Anxiety   . Arthritis    neck  . Bipolar disorder ([place])   . COPD (chronic obstructive pulmonary disease) ([place])   . Coronary artery disease    a. s/p anterolateral STEMI with BMS placed in large diagonal branch (2009).  Marland Kitchen Hyperlipidemia   . Hypertension   . Nodule of left lung    6-mm smooothly rounded nodule  . Plantar fasciitis    left foot  . Pre-diabetes   . Syncope and collapse    in 2001 and 2004 with no clear cause  . Tobacco abuse   . Viral URI with cough 11/22/2016    Past Surgical History:  Procedure Laterality Date  . COLONOSCOPY    . PERCUTANEOUS CORONARY STENT INTERVENTION (PCI-S)      There were no vitals filed for this visit.   Subjective Assessment - 02/26/21 0932    Subjective Rt suprascapular discomfort is "diminished". Feels better every time he leaves therapy session.    Pertinent History HTN, COPD, hx tobacco and alcohol abuse    How long can you sit comfortably? unlimited    How long can you stand comfortably? unlimited    Currently in Pain? Yes    Pain Score 1     Pain Location Scapula    Pain Orientation Right              [facility] PT Assessment - 02/26/21 0001      AROM   Right Shoulder ABduction 170 Degrees    Left Shoulder  ABduction 155 Degrees    Cervical - Right Side Bend 25    Cervical - Left Side Bend 25                         [facility] Adult PT Treatment/Exercise - 02/26/21 0001      Neck Exercises: Standing   Wall Push Ups 15 reps    Wall Push Ups Limitations verbal cues for decreased neutral cervical spine      Neck Exercises: Seated   Shoulder Flexion Both;10 reps    Shoulder Flexion Weights (lbs) 1lb    Other Seated Exercise scapation; x10 repetitions bil UE; 1lb      Neck Exercises: Supine   Other Supine Exercise on 1/2 foam roll; alternating UE flexion x10; horizontal abduction x10; chest press x 10      Shoulder Exercises: ROM/Strengthening   Other ROM/Strengthening Exercises shoulder on wall with thoracic rotation; x10 repetitions Rt/Lt                    PT Short Term Goals - 02/24/21 0956      PT SHORT TERM GOAL #1   Title  Patient will be independent with HEP for continued progression at home.    Time 3    Period Weeks    Status Achieved    Target Date 02/11/21      PT SHORT TERM GOAL #2   Title Patient will demonstrate 60 degrees cervical rotation bilaterally to indicate improved mobility for ADLs    Time 3    Period Weeks    Status Partially Met   Rt 63; Lt 50   Target Date 02/11/21             PT Long Term Goals - 02/17/21 0942      PT LONG TERM GOAL #1   Title Patient will improve FOTO score to 64 or higher to indicate improved overall function.    Baseline 53    Time 6    Period Weeks    Status New                 Plan - 02/26/21 1129    Clinical Impression Statement Patient continues to demonstrate significant cervical side bending ROM impairments. Bilateral shoulder AROM significantly improved. Tactile cuing required to isolate cervical side bending from thoracic side bending. Will continue to address in future sessions.    Personal Factors and Comorbidities Comorbidity 3+    Comorbidities HTN, COPD, and history of alcohol and  tobacco abuse    Examination-Activity Limitations Sleep    Examination-Participation Restrictions Personal Finances    Rehab Potential Excellent    PT Frequency 2x / week    PT Duration 6 weeks    PT Treatment/Interventions ADLs/Self Care Home Management;Electrical Stimulation;Cryotherapy;Iontophoresis 4mg /ml Dexamethasone;Moist Heat;Traction;Ultrasound;Therapeutic activities;Therapeutic exercise;Neuromuscular re-education;Patient/family education;Manual techniques;Passive range of motion;Dry needling;Taping;Spinal Manipulations;Joint Manipulations    PT Next Visit Plan progress note/recert next session; manual for cervical AROM; continue to address cervical and thoracic mobility; scapular retractors and depressors; hold DN    PT Home Exercise Plan Access Code [email or web address]    Consulted and Agree with Plan of Care Patient           Patient will benefit from skilled therapeutic intervention in order to improve the following deficits and impairments:  Decreased activity tolerance,Decreased mobility,Decreased range of motion,Decreased strength,Hypomobility,Increased fascial restricitons,Increased muscle spasms,Impaired flexibility,Postural dysfunction,Pain  Visit Diagnosis: Cramp and spasm  Cervicalgia     Problem List Patient Active Problem List   Diagnosis Date Noted  . Plantar fasciitis of left foot 09/03/2020  . Essential hypertension 05/06/2016  . Multiple pulmonary nodules 01/01/2015  . OSA (obstructive sleep apnea) 08/03/2013  . COPD (chronic obstructive pulmonary disease) ([place]) 10/23/2012  . NEVUS, ATYPICAL 07/31/2010  . Eczema 07/31/2010  . LUNG NODULE 07/28/2010  . AMI 11/20/2009  . POSTSURG PERCUT TRANSLUMINAL COR ANGPLSTY STS 08/07/2009  . Hyperlipidemia 07/07/2009  . Bipolar disorder ([place]) 07/07/2009  . CAD, NATIVE VESSEL 07/07/2009  . ALCOHOL ABUSE 07/04/2009  . TOBACCO ABUSE 07/04/2009  . SYNCOPE 07/04/2009   Everardo All PT, DPT  02/26/21 11:33 AM   [facility] [address], [place], [place], [address] Phone: [phone]   Fax:  [phone]  Name: Jeffrey Hudson MRN: [id number] Date of Birth: 02-Jan-1951

## 2021-03-03 ENCOUNTER — Other Ambulatory Visit: Payer: Self-pay

## 2021-03-03 ENCOUNTER — Ambulatory Visit: Payer: Medicare Other | Admitting: Physical Therapy

## 2021-03-03 DIAGNOSIS — R252 Cramp and spasm: Secondary | ICD-10-CM | POA: Diagnosis not present

## 2021-03-03 DIAGNOSIS — M542 Cervicalgia: Secondary | ICD-10-CM

## 2021-03-03 NOTE — Therapy (Signed)
[facility] [address] [place], [place], [address] Phone: [phone]   Fax:  [phone]  Physical Therapy Treatment  Patient Details  Name: Jeffrey Hudson MRN: [id number] Date of Birth: July 08, 1951 Referring Provider (PT): Dorothyann Peng, MD   Encounter Date: 03/03/2021   Progress Note Reporting Period 01/21/2021 to 03/03/2021  See note below for Objective Data and Assessment of Progress/Goals.        PT End of Session - 03/03/21 1158    Visit Number 8    Date for PT Re-Evaluation 03/04/21    Authorization Type UHC medicare    Progress Note Due on Visit 18    PT Start Time 0931    PT Stop Time 1013    PT Time Calculation (min) 42 min    Activity Tolerance Patient tolerated treatment well;No increased pain    Behavior During Therapy WFL for tasks assessed/performed           Past Medical History:  Diagnosis Date  . AMI (acute myocardial infarction) ([place])    Acute ST elevation  . Anxiety   . Arthritis    neck  . Bipolar disorder ([place])   . COPD (chronic obstructive pulmonary disease) ([place])   . Coronary artery disease    a. s/p anterolateral STEMI with BMS placed in large diagonal branch (2009).  Marland Kitchen Hyperlipidemia   . Hypertension   . Nodule of left lung    6-mm smooothly rounded nodule  . Plantar fasciitis    left foot  . Pre-diabetes   . Syncope and collapse    in 2001 and 2004 with no clear cause  . Tobacco abuse   . Viral URI with cough 11/22/2016    Past Surgical History:  Procedure Laterality Date  . COLONOSCOPY    . PERCUTANEOUS CORONARY STENT INTERVENTION (PCI-S)      There were no vitals filed for this visit.   Subjective Assessment - 03/03/21 0934    Subjective Had pain when first waking up but has since resolved. Scapular pain continues to diminish.    Pertinent History HTN, COPD, hx tobacco and alcohol abuse    How long can you sit comfortably? unlimited    How long can you  stand comfortably? unlimited    How long can you walk comfortably? unlimited    Patient Stated Goals to not have pain; sleep through the night    Currently in Pain? Yes    Pain Location Scapula    Pain Orientation Right    Pain Descriptors / Indicators Discomfort    Pain Type Chronic pain    Pain Onset 1 to 4 weeks ago    Pain Frequency Constant              [facility] PT Assessment - 03/03/21 0001      Assessment   Medical Diagnosis M54.2 (ICD-10-CM) - Neck pain    Referring Provider (PT) Dorothyann Peng, MD    Onset Date/Surgical Date 10/18/20    Hand Dominance Right    Next MD Visit None scheduled    Prior Therapy No      Precautions   Precautions None      Restrictions   Weight Bearing Restrictions No      Balance Screen   Has the patient fallen in the past 6 months No    Has the patient had a decrease in activity level because of a fear of falling?  No    Is the  patient reluctant to leave their home because of a fear of falling?  No      Home Ecologist residence    Living Arrangements Spouse/significant other      Prior Function   Level of [place] Retired      Associate Professor   Overall Cognitive Status Within Functional Limits for tasks assessed      Observation/Other Assessments   Focus on Therapeutic Outcomes (FOTO)  58   Goal 64     Posture/Postural Control   Posture/Postural Control Postural limitations    Postural Limitations Rounded Shoulders;Forward head;Increased thoracic kyphosis      AROM   Cervical - Right Rotation 51    Cervical - Left Rotation 48                         [facility] Adult PT Treatment/Exercise - 03/03/21 0001      Neck Exercises: Machines for Strengthening   Cybex Row 20 lbs; x10 repetitions    Lat Pull 25 lbs; x10 repetitions      Neck Exercises: Supine   Other Supine Exercise on pink foam roll; alternating UE flexion x10; horizontal abduction x10; chest press  x 10      Shoulder Exercises: ROM/Strengthening   Pushups 10 reps    Pushups Limitations at high table      Manual Therapy   Manual Therapy Joint mobilization;Soft tissue mobilization;Passive ROM    Joint Mobilization PAs Grade I-IV C2-T3; UPA C2-7 for rotation    Soft tissue mobilization Bilateral cervical paraspinals, upper trap, levator scap    Passive ROM cervical rotation Rt and Lt                    PT Short Term Goals - 03/03/21 1156      PT SHORT TERM GOAL #1   Title Patient will be independent with HEP for continued progression at home.    Time 3    Period Weeks    Status Achieved    Target Date 02/11/21      PT SHORT TERM GOAL #2   Title Patient will demonstrate 60 degrees cervical rotation bilaterally to indicate improved mobility for ADLs    Time 3    Period Weeks    Status Partially Met   previously Rt 63, Lt 50; today Rt 51, Lt 48   Target Date 02/11/21             PT Long Term Goals - 03/03/21 1157      PT LONG TERM GOAL #1   Title Patient will improve FOTO score to 64 or higher to indicate improved overall function.    Baseline 53    Time 6    Period Weeks    Status On-going   68     PT LONG TERM GOAL #2   Title Patient will be independent with advanced HEP for long term management of symptoms post D/C.    Time 4    Period Weeks    Status On-going    Target Date 03/31/21      PT LONG TERM GOAL #3   Title Patient will report no more than one instace of waking due to pain for two consecutive weeks for improved sleep hygiene    Time 6    Period Weeks    Status Achieved  Plan - 03/03/21 1028    Clinical Impression Statement Patient is a 70 y/o male referred due to neck pain. Patient demonstrates continued cervical AROM impairments with regards to rotation and Lt continues to be worse than Rt. However bilateral UE AROM significantly improved during this episode of care. Patient reports improved sleep hygiene as he  is no longer awakened due to symptoms. Additionally patient reports >50% improvement in symptoms overall. However deficits continue to persist as patient continues to experience increased pain in the morning  and at end of day which directly affects activity performance. Furthermore, patient continues to have increased difficulty dissociating cervical movement from bilateral UE movement. FOTO score improved to 58 but has yet to reach goal of 64. Would benefit from continued skilled intervention to address impairments for decreased pain and improved use of bilateral UE.    Personal Factors and Comorbidities Comorbidity 3+    Comorbidities HTN, COPD, and history of alcohol and tobacco abuse    Examination-Activity Limitations Sleep    Examination-Participation Restrictions Personal Finances    Stability/Clinical Decision Making Stable/Uncomplicated    Clinical Decision Making Low    Rehab Potential Excellent    PT Frequency 1x / week    PT Duration 4 weeks    PT Treatment/Interventions ADLs/Self Care Home Management;Electrical Stimulation;Cryotherapy;Iontophoresis 4mg /ml Dexamethasone;Moist Heat;Traction;Ultrasound;Therapeutic activities;Therapeutic exercise;Neuromuscular re-education;Patient/family education;Manual techniques;Passive range of motion;Dry needling;Taping;Spinal Manipulations;Joint Manipulations    PT Next Visit Plan continue mobility and scapular stabilization; manual for cervical AROM    PT Home Exercise Plan Access Code [email or web address]    Consulted and Agree with Plan of Care Patient           Patient will benefit from skilled therapeutic intervention in order to improve the following deficits and impairments:  Decreased activity tolerance,Decreased mobility,Decreased range of motion,Decreased strength,Hypomobility,Increased fascial restricitons,Increased muscle spasms,Impaired flexibility,Postural dysfunction,Pain  Visit Diagnosis: Cramp and spasm - Plan: PT plan of care  cert/re-cert  Cervicalgia - Plan: PT plan of care cert/re-cert     Problem List Patient Active Problem List   Diagnosis Date Noted  . Plantar fasciitis of left foot 09/03/2020  . Essential hypertension 05/06/2016  . Multiple pulmonary nodules 01/01/2015  . OSA (obstructive sleep apnea) 08/03/2013  . COPD (chronic obstructive pulmonary disease) ([place]) 10/23/2012  . NEVUS, ATYPICAL 07/31/2010  . Eczema 07/31/2010  . LUNG NODULE 07/28/2010  . AMI 11/20/2009  . POSTSURG PERCUT TRANSLUMINAL COR ANGPLSTY STS 08/07/2009  . Hyperlipidemia 07/07/2009  . Bipolar disorder ([place]) 07/07/2009  . CAD, NATIVE VESSEL 07/07/2009  . ALCOHOL ABUSE 07/04/2009  . TOBACCO ABUSE 07/04/2009  . SYNCOPE 07/04/2009   Everardo All PT, DPT  03/03/21 12:02 PM    [facility] [address], [place], [place], [address] Phone: [phone]   Fax:  [phone]  Name: Jeffrey Hudson MRN: [id number] Date of Birth: 19-Sep-1951

## 2021-03-04 ENCOUNTER — Telehealth: Payer: Self-pay | Admitting: Adult Health

## 2021-03-04 NOTE — Telephone Encounter (Signed)
Left message for patient to call back and schedule Medicare Annual Wellness Visit (AWV) either virtually or in office.   AWV-I per [place] 09/18/15 please schedule at anytime with [facility] Nurse Health Advisor 1 or 2   This should be a 45 minute visit.

## 2021-03-06 ENCOUNTER — Ambulatory Visit: Payer: Medicare Other | Admitting: Physical Therapy

## 2021-03-09 DIAGNOSIS — H401131 Primary open-angle glaucoma, bilateral, mild stage: Secondary | ICD-10-CM | POA: Diagnosis not present

## 2021-03-09 LAB — HM DIABETES EYE EXAM

## 2021-03-10 ENCOUNTER — Ambulatory Visit: Payer: Medicare Other | Admitting: Physical Therapy

## 2021-03-10 ENCOUNTER — Other Ambulatory Visit: Payer: Self-pay

## 2021-03-10 DIAGNOSIS — M542 Cervicalgia: Secondary | ICD-10-CM

## 2021-03-10 DIAGNOSIS — R252 Cramp and spasm: Secondary | ICD-10-CM | POA: Diagnosis not present

## 2021-03-10 NOTE — Therapy (Signed)
[facility] [address] [place], [place], [address] Phone: [phone]   Fax:  [phone]  Physical Therapy Treatment  Patient Details  Name: Jeffrey Hudson MRN: [id number] Date of Birth: [birth year] Referring Provider (PT): Dorothyann Peng, MD   Encounter Date: 03/10/2021   PT End of Session - 03/10/21 1119    Visit Number 9    Number of Visits 12    Date for PT Re-Evaluation 03/31/21    Authorization Type UHC medicare    PT Start Time 0930    PT Stop Time 1015    PT Time Calculation (min) 45 min    Activity Tolerance Patient tolerated treatment well           Past Medical History:  Diagnosis Date  . AMI (acute myocardial infarction) ([place])    Acute ST elevation  . Anxiety   . Arthritis    neck  . Bipolar disorder ([place])   . COPD (chronic obstructive pulmonary disease) ([place])   . Coronary artery disease    a. s/p anterolateral STEMI with BMS placed in large diagonal branch (2009).  Marland Kitchen Hyperlipidemia   . Hypertension   . Nodule of left lung    6-mm smooothly rounded nodule  . Plantar fasciitis    left foot  . Pre-diabetes   . Syncope and collapse    in 2001 and 2004 with no clear cause  . Tobacco abuse   . Viral URI with cough 11/22/2016    Past Surgical History:  Procedure Laterality Date  . COLONOSCOPY    . PERCUTANEOUS CORONARY STENT INTERVENTION (PCI-S)      There were no vitals filed for this visit.   Subjective Assessment - 03/10/21 0933    Subjective I'm OK.  It hurts all the time 24/7 but it's a 1/10.  Hurts in the AM.  Scapular pain is diminishing mostly bil neck pain.    Currently in Pain? Yes    Pain Score 1     Pain Location Neck    Pain Type Chronic pain                             [facility] Adult PT Treatment/Exercise - 03/10/21 0001      Neck Exercises: Standing   Other Standing Exercises small ball on wall rotations facing wall 10x   Other Standing  Exercises small ball behind head cervical extension isometrics 10x     Neck Exercises: Supine   Other Supine Exercise foam roll melt method: rotation, circles, nods  10x each    Other Supine Exercise UE elevation with "molasses" pulls down 10x     Shoulder Exercises: ROM/Strengthening   Other ROM/Strengthening Exercises sidelying open books 8x    Other ROM/Strengthening Exercises standing at the wall with green loop shoulder external rotation 12x      Manual Therapy   Joint Mobilization PA mobs grade 3 30 sec C4-C7; rotation mobs with movement 5x right/left    Soft tissue mobilization Bilateral cervical paraspinals, upper trap, levator scap; with and without Addaday    Manual Traction manual cervical traction with suboccipital release                    PT Short Term Goals - 03/03/21 1156      PT SHORT TERM GOAL #1   Title Patient will be independent with HEP for continued progression at home.  Time 3    Period Weeks    Status Achieved    Target Date 02/11/21      PT SHORT TERM GOAL #2   Title Patient will demonstrate 60 degrees cervical rotation bilaterally to indicate improved mobility for ADLs    Time 3    Period Weeks    Status Partially Met   previously Rt 63, Lt 50; today Rt 51, Lt 48   Target Date 02/11/21             PT Long Term Goals - 03/03/21 1157      PT LONG TERM GOAL #1   Title Patient will improve FOTO score to 64 or higher to indicate improved overall function.    Baseline 53    Time 6    Period Weeks    Status On-going   68     PT LONG TERM GOAL #2   Title Patient will be independent with advanced HEP for long term management of symptoms post D/C.    Time 4    Period Weeks    Status On-going    Target Date 03/31/21      PT LONG TERM GOAL #3   Title Patient will report no more than one instace of waking due to pain for two consecutive weeks for improved sleep hygiene    Time 6    Period Weeks    Status Achieved                  Plan - 03/10/21 1120    Clinical Impression Statement The patient reports bil neck pain that is constant but low in intensity.  Stiffness predominantly in  morning.  He reports less stiffness following cervical mobility and strengthening ex's in the clinic today.  Modified resistive ex's (band around wrists instead of gripping) secondary to his reports of thumb and finger pain he attributes to arthritis.  Therapist monitoring response throughout treatment session.    Rehab Potential Excellent    PT Frequency 1x / week    PT Duration 4 weeks    PT Treatment/Interventions ADLs/Self Care Home Management;Electrical Stimulation;Cryotherapy;Iontophoresis 4mg /ml Dexamethasone;Moist Heat;Traction;Ultrasound;Therapeutic activities;Therapeutic exercise;Neuromuscular re-education;Patient/family education;Manual techniques;Passive range of motion;Dry needling;Taping;Spinal Manipulations;Joint Manipulations    PT Next Visit Plan 10th visit progress note next visit;  continue mobility and scapular stabilization; manual for cervical AROM    PT Home Exercise Plan Access Code [email or web address]           Patient will benefit from skilled therapeutic intervention in order to improve the following deficits and impairments:  Decreased activity tolerance,Decreased mobility,Decreased range of motion,Decreased strength,Hypomobility,Increased fascial restricitons,Increased muscle spasms,Impaired flexibility,Postural dysfunction,Pain  Visit Diagnosis: Cramp and spasm  Cervicalgia     Problem List Patient Active Problem List   Diagnosis Date Noted  . Plantar fasciitis of left foot 09/03/2020  . Essential hypertension 05/06/2016  . Multiple pulmonary nodules 01/01/2015  . OSA (obstructive sleep apnea) 08/03/2013  . COPD (chronic obstructive pulmonary disease) ([place]) 10/23/2012  . NEVUS, ATYPICAL 07/31/2010  . Eczema 07/31/2010  . LUNG NODULE 07/28/2010  . AMI 11/20/2009  . POSTSURG PERCUT TRANSLUMINAL  COR ANGPLSTY STS 08/07/2009  . Hyperlipidemia 07/07/2009  . Bipolar disorder ([place]) 07/07/2009  . CAD, NATIVE VESSEL 07/07/2009  . ALCOHOL ABUSE 07/04/2009  . TOBACCO ABUSE 07/04/2009  . SYNCOPE 07/04/2009   Ruben Im, PT 03/10/21 11:30 AM Phone: [phone] Fax: [phone]  Alvera Singh 03/10/2021, 11:29 AM  [facility] [address],  [place], [place], [address] Phone: [phone]   Fax:  [phone]  Name: RAYN ENDERSON MRN: [id number] Date of Birth: 05/05/1951

## 2021-03-11 ENCOUNTER — Ambulatory Visit (INDEPENDENT_AMBULATORY_CARE_PROVIDER_SITE_OTHER): Payer: Medicare Other | Admitting: Adult Health

## 2021-03-11 ENCOUNTER — Encounter: Payer: Self-pay | Admitting: Adult Health

## 2021-03-11 VITALS — BP 142/80 | HR 61 | Temp 98.6°F | Ht 65.0 in | Wt 157.0 lb

## 2021-03-11 DIAGNOSIS — Z720 Tobacco use: Secondary | ICD-10-CM

## 2021-03-11 DIAGNOSIS — R142 Eructation: Secondary | ICD-10-CM

## 2021-03-11 DIAGNOSIS — M542 Cervicalgia: Secondary | ICD-10-CM

## 2021-03-11 NOTE — Progress Notes (Signed)
Subjective:    Patient ID: Jeffrey Hudson, male    DOB: 11-11-1950, 70 y.o.   MRN: [id number]  HPI  70 year old male who  has a past medical history of AMI (acute myocardial infarction) ([place]), Anxiety, Arthritis, Bipolar disorder ([place]), COPD (chronic obstructive pulmonary disease) ([place]), Coronary artery disease, Hyperlipidemia, Hypertension, Nodule of left lung, Plantar fasciitis, Pre-diabetes, Syncope and collapse, Tobacco abuse, and Viral URI with cough (11/22/2016).  He presents to the office today for follow up regarding  Cervicalgia/Burping/GERD  Cervicalgia -continues with physical therapy and has noticed some significant improvement in his discomfort and range of motion with PT.  He continues to still be in some constant pain but is hopeful with physical therapy and the exercises that he can do at home he will be able to manage without much medication therapy   Burping/GERD-has been seen by gastroenterology since the last time I have seen him.  He has a scheduled endoscopy and colonoscopy in July 2022.  He continues to have excessive belching and it is disrupting his sleep.  Dr. Carlean Purl thought that there might be some degree of air trapping causing his symptoms due to smoking and COPD.  Patient reports that he has cut back about 50% on his smoking and is hopeful that he can quit completely.  Thankfully, he has noticed some mild improvement in his symptoms with cutting back on cigarettes?   Review of Systems See HPI   Past Medical History:  Diagnosis Date  . AMI (acute myocardial infarction) ([place])    Acute ST elevation  . Anxiety   . Arthritis    neck  . Bipolar disorder ([place])   . COPD (chronic obstructive pulmonary disease) ([place])   . Coronary artery disease    a. s/p anterolateral STEMI with BMS placed in large diagonal branch (2009).  Marland Kitchen Hyperlipidemia   . Hypertension   . Nodule of left lung    6-mm smooothly rounded nodule  . Plantar fasciitis    left foot  .  Pre-diabetes   . Syncope and collapse    in 2001 and 2004 with no clear cause  . Tobacco abuse   . Viral URI with cough 11/22/2016    Social History   Socioeconomic History  . Marital status: Married    Spouse name: Not on file  . Number of children: 1  . Years of education: Not on file  . Highest education level: Not on file  Occupational History  . Occupation: Retired-Sales/broker  Tobacco Use  . Smoking status: Current Every Day Smoker    Packs/day: 0.50    Years: 45.00    Pack years: 22.50    Types: Cigarettes  . Smokeless tobacco: Never Used  Vaping Use  . Vaping Use: Never used  Substance and Sexual Activity  . Alcohol use: Yes    Alcohol/week: 1.0 standard drink    Types: 1 Standard drinks or equivalent per week    Comment: 2 glasses of wine daily  . Drug use: No    Comment: marijuana quit 2010. He has hx of THC use.  Marland Kitchen Sexual activity: Not on file  Other Topics Concern  . 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 - plays once a week    2  glasses of wine a day max, still smoking 2 cups of coffee a day no drug use or other tobacco   Social Determinants of Radio broadcast assistant Strain: Not on file  Food Insecurity: Not on file  Transportation Needs: Not on file  Physical Activity: Not on file  Stress: Not on file  Social Connections: Not on file  Intimate Partner Violence: Not on file    Past Surgical History:  Procedure Laterality Date  . COLONOSCOPY    . PERCUTANEOUS CORONARY STENT INTERVENTION (PCI-S)      Family History  Problem Relation Age of Onset  . Dementia Mother 31       died  . Alcohol abuse Father 29  . Depression Brother   . Colon cancer Neg Hx   . Esophageal cancer Neg Hx   . Rectal cancer Neg Hx     Allergies  Allergen Reactions  . Lisinopril Swelling    Angio  edema   . Norvasc [Amlodipine] Swelling    Lower extremity     Current Outpatient Medications on File Prior to Visit  Medication Sig Dispense Refill  . albuterol (PROAIR HFA) 108 (90 Base) MCG/ACT inhaler Inhale 2 puffs into the lungs every 6 (six) hours as needed. 1 Inhaler 3  . aspirin 81 MG tablet Take 81 mg by mouth daily.    Marland Kitchen atorvastatin (LIPITOR) 80 MG tablet TAKE 1 TABLET BY MOUTH  DAILY AT 6 PM 90 tablet 3  . betamethasone dipropionate 0.05 % cream as needed.     . ezetimibe (ZETIA) 10 MG tablet Take 1 tablet (10 mg total) by mouth daily. 90 tablet 3  . FLUoxetine (PROZAC) 20 MG capsule Take 1 capsule (20 mg total) by mouth daily. 90 capsule 1  . Glycopyrrolate-Formoterol (BEVESPI AEROSPHERE) 9-4.8 MCG/ACT AERO Inhale 2 puffs into the lungs 2 (two) times daily. (Patient taking differently: Inhale 2 puffs into the lungs as needed.) 10.7 g 5  . hydrochlorothiazide (MICROZIDE) 12.5 MG capsule Take 1 capsule (12.5 mg total) by mouth 2 (two) times daily. 180 capsule 2  . meloxicam (MOBIC) 7.5 MG tablet Take 7.5 mg by mouth as needed for pain.    . metFORMIN (GLUCOPHAGE) 500 MG tablet Take 1 tablet (500 mg total) by mouth daily with breakfast. 90 tablet 1  . metoprolol succinate (TOPROL-XL) 25 MG 24 hr tablet TAKE 1 TABLET BY MOUTH  DAILY. 90 tablet 3  . Multiple Vitamin (MULTIVITAMIN) tablet Take 1 tablet by mouth daily.    . nitroGLYCERIN (NITROSTAT) 0.4 MG SL tablet Place 0.4 mg under the tongue every 5 (five) minutes as needed for chest pain.    Marland Kitchen timolol (BETIMOL) 0.25 % ophthalmic solution Place 1-2 drops into both eyes daily.    . travoprost, benzalkonium, (TRAVATAN) 0.004 % ophthalmic solution Place 1 drop into both eyes at bedtime.    . Turmeric (QC TUMERIC COMPLEX PO) Take 1 capsule by mouth daily.    . Omeprazole Magnesium (PRILOSEC PO) Take by mouth daily. OTC (Patient not taking: Reported on 03/11/2021)     No current facility-administered medications on file prior to visit.     BP (!) 142/80   Pulse 61   Temp 98.6 F (37 C) (Oral)   Ht 5\' 5"  (1.651 m)   Wt 157 lb (71.2 kg)   SpO2 98%   BMI 26.13 kg/m       Objective:   Physical Exam Vitals and nursing note reviewed.  Constitutional:  Appearance: Normal appearance.  Musculoskeletal:        General: Normal range of motion.  Skin:    General: Skin is warm and dry.  Neurological:     General: No focal deficit present.     Mental Status: He is alert and oriented to person, place, and time.  Psychiatric:        Mood and Affect: Mood normal.        Behavior: Behavior normal.        Thought Content: Thought content normal.       Assessment & Plan:  1. Cervicalgia - Continue with PT.  - Caution use with NSAIDS d/t stomach issues - Can try Tylenol arthritis   2. Burping - Ideally need to have endoscopy done first  3. Tobacco use - Congratulated on cutting back - keep working to goal of quitting   Dorothyann Peng, NP

## 2021-03-12 ENCOUNTER — Encounter: Payer: Medicare Other | Admitting: Physical Therapy

## 2021-03-13 ENCOUNTER — Telehealth: Payer: Self-pay | Admitting: Adult Health

## 2021-03-13 NOTE — Telephone Encounter (Signed)
Recv'd records from [facility] forwarded 2 pages to Dr. Dorothyann Peng 5/27/22fbg

## 2021-03-17 ENCOUNTER — Other Ambulatory Visit: Payer: Self-pay

## 2021-03-17 ENCOUNTER — Ambulatory Visit: Payer: Medicare Other | Admitting: Physical Therapy

## 2021-03-17 DIAGNOSIS — M542 Cervicalgia: Secondary | ICD-10-CM | POA: Diagnosis not present

## 2021-03-17 DIAGNOSIS — R252 Cramp and spasm: Secondary | ICD-10-CM

## 2021-03-17 NOTE — Patient Instructions (Signed)
Sidelying Open Book Thoracic Lumbar Rotation and Extension - 1 x daily - 7 x weekly - 1 sets - 5 reps - 5s hold Thoracic Extension Mobilization on Foam Roll - 1 x daily - 7 x weekly - 1 sets - 3 reps - 5-10s hold

## 2021-03-17 NOTE — Therapy (Signed)
[facility] [address] [place], [place], [address] Phone: [phone]   Fax:  [phone]  Physical Therapy Treatment  Patient Details  Name: Jeffrey Hudson MRN: [id number] Date of Birth: 1951-09-02 Referring Provider (PT): Dorothyann Peng, MD   Encounter Date: 03/17/2021   PT End of Session - 03/17/21 1156    Visit Number 10    Number of Visits 12    Date for PT Re-Evaluation 03/31/21    Authorization Type [employer] medicare    Progress Note Due on Visit 18    PT Start Time 0930    PT Stop Time 1012    PT Time Calculation (min) 42 min    Activity Tolerance Patient tolerated treatment well;No increased pain    Behavior During Therapy WFL for tasks assessed/performed           Past Medical History:  Diagnosis Date  . AMI (acute myocardial infarction) ([place])    Acute ST elevation  . Anxiety   . Arthritis    neck  . Bipolar disorder ([place])   . COPD (chronic obstructive pulmonary disease) ([place])   . Coronary artery disease    a. s/p anterolateral STEMI with BMS placed in large diagonal branch (2009).  Marland Kitchen Hyperlipidemia   . Hypertension   . Nodule of left lung    6-mm smooothly rounded nodule  . Plantar fasciitis    left foot  . Pre-diabetes   . Syncope and collapse    in 2001 and 2004 with no clear cause  . Tobacco abuse   . Viral URI with cough 11/22/2016    Past Surgical History:  Procedure Laterality Date  . COLONOSCOPY    . PERCUTANEOUS CORONARY STENT INTERVENTION (PCI-S)      There were no vitals filed for this visit.       [facility] PT Assessment - 03/17/21 0001      Assessment   Medical Diagnosis M54.2 (ICD-10-CM) - Neck pain    Referring Provider (PT) Dorothyann Peng, MD    Onset Date/Surgical Date 10/18/20    Hand Dominance Right    Next MD Visit None scheduled    Prior Therapy No      Precautions   Precautions None      Restrictions   Weight Bearing Restrictions No      Balance  Screen   Has the patient fallen in the past 6 months No    Has the patient had a decrease in activity level because of a fear of falling?  No    Is the patient reluctant to leave their home because of a fear of falling?  No      Prior Function   Level of Independence Independent    Vocation Retired      Charity fundraiser Status Within Functional Limits for tasks assessed      Observation/Other Assessments   Focus on Therapeutic Outcomes (FOTO)  65   goal 64     Posture/Postural Control   Posture/Postural Control Postural limitations    Postural Limitations Forward head      AROM   Cervical - Right Side Bend 25    Cervical - Left Side Bend 35    Cervical - Right Rotation 60    Cervical - Left Rotation 51                         [facility] Adult PT Treatment/Exercise - 03/17/21  0001      Neck Exercises: Machines for Strengthening   UBE (Upper Arm Bike) L2; 2.5 minutes fwd/back; PT present to discuss progress and monitor patient response      Neck Exercises: Standing   Other Standing Exercises small ball on wall rotations facing wall      Neck Exercises: Supine   Other Supine Exercise foam roll melt: rotation, nods    Other Supine Exercise foam roll: thoracic extension mobilization; 3 x 10s hold      Shoulder Exercises: Sidelying   Other Sidelying Exercises open books; 3 x 10s hold Rt/Lt      Shoulder Exercises: Standing   External Rotation Both;12 reps    External Rotation Weight (lbs) green loop at hands; back against wall; tactile cues for form    Other Standing Exercises wall clocks: green loop at hands; x8 repetitions Rt/Lt                  PT Education - 03/17/21 1151    Education Details sidelying open books; supine thoracic extension over towel roll    Person(s) Educated Patient    Methods Explanation;Demonstration;Tactile cues;Verbal cues;Handout    Comprehension Verbalized understanding;Returned demonstration;Verbal cues  required;Tactile cues required            PT Short Term Goals - 03/17/21 1155      PT SHORT TERM GOAL #1   Title Patient will be independent with HEP for continued progression at home.    Time 3    Period Weeks    Status Achieved    Target Date 02/11/21      PT SHORT TERM GOAL #2   Title Patient will demonstrate 60 degrees cervical rotation bilaterally to indicate improved mobility for ADLs    Time 3    Period Weeks    Status Partially Met   Rt 60; Lt 55   Target Date 02/11/21             PT Long Term Goals - 03/17/21 1155      PT LONG TERM GOAL #1   Title Patient will improve FOTO score to 64 or higher to indicate improved overall function.    Baseline 53    Time 6    Period Weeks    Status Achieved   65     PT LONG TERM GOAL #2   Title Patient will be independent with advanced HEP for long term management of symptoms post D/C.    Time 4    Period Weeks    Status Achieved      PT LONG TERM GOAL #3   Title Patient will report no more than one instace of waking due to pain for two consecutive weeks for improved sleep hygiene    Time 6    Period Weeks    Status Achieved                 Plan - 03/17/21 1152    Clinical Impression Statement Patient is a 70 y/o male referred due to neck pain. PMH includes HTN, COPD, and history of alcohol and tobacco abuse. Patient demonstrates improvements in cervical AROM as he is able to rotate >50 degrees bilaterally and perform Lt side bend to 35 degrees. Rt sidebending remains limited. Activity tolerance improved as patient able to perform all daily activites with no more than 1/10 pain. FOTO score significantly improved and today's score at goal. Patient demonstrates good knowledge of exercise progression and continued maintenance. Patient  to be discharged to HEP.    Personal Factors and Comorbidities Comorbidity 3+    Comorbidities HTN, COPD, and history of alcohol and tobacco abuse    Examination-Activity Limitations  Sleep    Examination-Participation Restrictions Personal Finances    Rehab Potential Excellent    PT Frequency 1x / week    PT Duration 4 weeks    PT Treatment/Interventions ADLs/Self Care Home Management;Electrical Stimulation;Cryotherapy;Iontophoresis 3m/ml Dexamethasone;Moist Heat;Traction;Ultrasound;Therapeutic activities;Therapeutic exercise;Neuromuscular re-education;Patient/family education;Manual techniques;Passive range of motion;Dry needling;Taping;Spinal Manipulations;Joint Manipulations    PT Next Visit Plan D/C to HEP    PT Home Exercise Plan Access Code [email or web address]   Consulted and Agree with Plan of Care Patient           Patient will benefit from skilled therapeutic intervention in order to improve the following deficits and impairments:  Decreased activity tolerance,Decreased mobility,Decreased range of motion,Decreased strength,Hypomobility,Increased fascial restricitons,Increased muscle spasms,Impaired flexibility,Postural dysfunction,Pain  Visit Diagnosis: Cramp and spasm  Cervicalgia     Problem List Patient Active Problem List   Diagnosis Date Noted  . Plantar fasciitis of left foot 09/03/2020  . Essential hypertension 05/06/2016  . Multiple pulmonary nodules 01/01/2015  . OSA (obstructive sleep apnea) 08/03/2013  . COPD ([place] obstructive pulmonary disease) ([place] 10/23/2012  . NEVUS, ATYPICAL 07/31/2010  . Eczema 07/31/2010  . LUNG NODULE 07/28/2010  . AMI 11/20/2009  . POSTSURG PERCUT TRANSLUMINAL COR ANGPLSTY STS 08/07/2009  . Hyperlipidemia 07/07/2009  . Bipolar disorder ([place] 07/07/2009  . CAD, NATIVE VESSEL 07/07/2009  . ALCOHOL ABUSE 07/04/2009  . TOBACCO ABUSE 07/04/2009  . SYNCOPE 07/04/2009   KEverardo AllPT, DPT  03/17/21 12:02 PM   [facility] [address] [place] [address]Phone: [phone]  Fax:  [phone] Name: WPARRY POMRN: [id number]  of Birth: 1Jul 04, 1952 PHYSICAL THERAPY DISCHARGE SUMMARY  Visits from Start of Care: 10  Current functional level related to goals / functional outcomes: See above   Remaining deficits: See above   Education / Equipment: See above  Plan: Patient agrees to discharge.  Patient goals were partially met. Patient is being discharged due to being pleased with the current functional level.  ?????

## 2021-03-19 ENCOUNTER — Ambulatory Visit: Payer: Medicare Other | Admitting: Physical Therapy

## 2021-03-23 ENCOUNTER — Encounter: Payer: Self-pay | Admitting: Adult Health

## 2021-03-24 ENCOUNTER — Encounter: Payer: Medicare Other | Admitting: Physical Therapy

## 2021-03-26 ENCOUNTER — Encounter: Payer: Medicare Other | Admitting: Physical Therapy

## 2021-03-27 ENCOUNTER — Telehealth: Payer: Self-pay | Admitting: Internal Medicine

## 2021-03-27 NOTE — Telephone Encounter (Signed)
Patient reports that he continues to have the same symptoms and now has diarrhea.  There are no opportunities at this time to move up the procedures.  He is currently scheduled in July.  He is asking if there are tx options to control the symptoms until the procedures?  He understands Dr. Carlean Purl is out of the office until next week.  He would prefer to have response through MyChart.

## 2021-03-27 NOTE — Telephone Encounter (Signed)
Patient called states he has been having the same issues he presented with when he came into the office. A lot of burping, gas and diarrhea for a few weeks now and is seeking advise.

## 2021-03-30 MED ORDER — DICYCLOMINE HCL 10 MG PO CAPS: 10.0000 mg | ORAL_CAPSULE | Freq: Three times a day (TID) | ORAL | 0 refills | Status: DC

## 2021-04-01 ENCOUNTER — Ambulatory Visit: Payer: Medicare Other | Admitting: Physical Therapy

## 2021-04-07 ENCOUNTER — Telehealth: Payer: Self-pay | Admitting: Adult Health

## 2021-04-07 NOTE — Telephone Encounter (Signed)
Patient is needing a referral to [facility].

## 2021-04-08 ENCOUNTER — Telehealth (INDEPENDENT_AMBULATORY_CARE_PROVIDER_SITE_OTHER): Payer: Medicare Other | Admitting: Psychiatry

## 2021-04-08 ENCOUNTER — Other Ambulatory Visit: Payer: Self-pay

## 2021-04-08 DIAGNOSIS — F325 Major depressive disorder, single episode, in full remission: Secondary | ICD-10-CM

## 2021-04-08 MED ORDER — FLUOXETINE HCL 20 MG PO CAPS: 20.0000 mg | ORAL_CAPSULE | Freq: Every day | ORAL | 2 refills | Status: DC

## 2021-04-08 NOTE — Telephone Encounter (Signed)
Called pt 2x no answer.  

## 2021-04-08 NOTE — Progress Notes (Signed)
Psychiatric Initial Adult Assessment   Patient Identification: Jeffrey Hudson MRN:  [id number] Date of Evaluation:  04/08/2021 Referral Source: Dr. Zettie Pho Chief Complaint:   Visit Diagnosis: Bipolar disorder  History of Present Illness:    Today the patient is doing fairly well emotionally.  He is successfully off Zyprexa and takes just 20 mg of Prozac.  He denies daily depression or any evidence of anhedonia.  He is sleeping and eating well and has good energy.  He has no problems thinking or concentrating and has a good sense of worth.  Presently he has been evaluated through the gastroenterologist Dr. Autumn Patty of because of chronic diarrhea.  The patient has not lost any weight.  The patient drinks no alcohol and uses no drugs.  He has been very stable now for a number of years.  The patient is functioning well.  Financially he is very stable.  (Hypo) Manic Symptoms:   Anxiety Symptoms:   Psychotic Symptoms:   PTSD Symptoms:   Past Psychiatric History: Zyprexa and Prozac, has been psychotherapy in the past including cognitive behavioral therapy.  Previous Psychotropic Medications: Zyprexa and Prozac  Substance Abuse History in the last 12 months:    Consequences of Substance Abuse:   Past Medical History:  Past Medical History:  Diagnosis Date   AMI (acute myocardial infarction) ([place])    Acute ST elevation   Anxiety    Arthritis    neck   Bipolar disorder ([place])    COPD (chronic obstructive pulmonary disease) ([place])    Coronary artery disease    a. s/p anterolateral STEMI with BMS placed in large diagonal branch (2009).   Hyperlipidemia    Hypertension    Nodule of left lung    6-mm smooothly rounded nodule   Plantar fasciitis    left foot   Pre-diabetes    Syncope and collapse    in 2001 and 2004 with no clear cause   Tobacco abuse    Viral URI with cough 11/22/2016    Past Surgical History:  Procedure Laterality Date   COLONOSCOPY     PERCUTANEOUS CORONARY  STENT INTERVENTION (PCI-S)      Family Psychiatric History:   Family History:  Family History  Problem Relation Age of Onset   Dementia Mother 73       died   Alcohol abuse Father 82   Depression Brother    Colon cancer Neg Hx    Esophageal cancer Neg Hx    Rectal cancer Neg Hx     Social History:   Social History   Socioeconomic History   Marital status: Married    Spouse name: Not on file   Number of children: 1   Years of education: Not on file   Highest education level: Not on file  Occupational History   Occupation: Retired-Sales/broker  Tobacco Use   Smoking status: Every Day    Packs/day: 0.50    Years: 45.00    Pack years: 22.50    Types: Cigarettes   Smokeless tobacco: Never  Vaping Use   Vaping Use: Never used  Substance and Sexual Activity   Alcohol use: Yes    Alcohol/week: 1.0 standard drink    Types: 1 Standard drinks or equivalent per week    Comment: 2 glasses of wine daily   Drug use: No    Comment: marijuana quit 2010. He has hx of THC use.   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 - plays once a week    2 glasses of wine a day max, still smoking 2 cups of coffee a day no drug use or other tobacco   Social Determinants of Radio broadcast assistant Strain: Not on file  Food Insecurity: Not on file  Transportation Needs: Not on file  Physical Activity: Not on file  Stress: Not on file  Social Connections: Not on file    Additional Social History:   Allergies:   Allergies  Allergen Reactions   Lisinopril Swelling    Angio edema    Norvasc [Amlodipine] Swelling    Lower extremity     Metabolic Disorder Labs: Lab Results  Component Value Date   HGBA1C 6.4 01/15/2021   MPG 131 06/24/2009   No results found for:  PROLACTIN Lab Results  Component Value Date   CHOL 174 01/15/2021   TRIG 160.0 (H) 01/15/2021   HDL 64.70 01/15/2021   CHOLHDL 3 01/15/2021   VLDL 32.0 01/15/2021   LDLCALC 77 01/15/2021   LDLCALC 71 08/05/2020   Lab Results  Component Value Date   TSH 3.40 01/15/2021    Therapeutic Level Labs: No results found for: LITHIUM No results found for: CBMZ No results found for: VALPROATE  Current Medications: Current Outpatient Medications  Medication Sig Dispense Refill   albuterol (PROAIR HFA) 108 (90 Base) MCG/ACT inhaler Inhale 2 puffs into the lungs every 6 (six) hours as needed. 1 Inhaler 3   aspirin 81 MG tablet Take 81 mg by mouth daily.     atorvastatin (LIPITOR) 80 MG tablet TAKE 1 TABLET BY MOUTH  DAILY AT 6 PM 90 tablet 3   betamethasone dipropionate 0.05 % cream as needed.      dicyclomine (BENTYL) 10 MG capsule Take 1 capsule (10 mg total) by mouth 3 (three) times daily before meals. 90 capsule 0   ezetimibe (ZETIA) 10 MG tablet Take 1 tablet (10 mg total) by mouth daily. 90 tablet 3   FLUoxetine (PROZAC) 20 MG capsule Take 1 capsule (20 mg total) by mouth daily. 90 capsule 2   Glycopyrrolate-Formoterol (BEVESPI AEROSPHERE) 9-4.8 MCG/ACT AERO Inhale 2 puffs into the lungs 2 (two) times daily. (Patient taking differently: Inhale 2 puffs into the lungs as needed.) 10.7 g 5   hydrochlorothiazide (MICROZIDE) 12.5 MG capsule Take 1 capsule (12.5 mg total) by mouth 2 (two) times daily. 180 capsule 2   meloxicam (MOBIC) 7.5 MG tablet Take 7.5 mg by mouth as needed for pain.     metFORMIN (GLUCOPHAGE) 500 MG tablet Take 1 tablet (500 mg total) by mouth daily with breakfast. 90 tablet 1   metoprolol succinate (TOPROL-XL) 25 MG 24 hr tablet TAKE 1 TABLET BY MOUTH  DAILY. 90 tablet 3   Multiple Vitamin (MULTIVITAMIN) tablet Take 1 tablet by mouth daily.     nitroGLYCERIN (NITROSTAT) 0.4 MG SL tablet Place 0.4 mg under the tongue every 5 (five) minutes as needed for chest pain.      Omeprazole Magnesium (PRILOSEC PO) Take by mouth daily. OTC (Patient not taking: Reported on 03/11/2021)     timolol (BETIMOL) 0.25 % ophthalmic solution Place 1-2 drops into both eyes daily.     travoprost, benzalkonium, (TRAVATAN) 0.004 % ophthalmic  solution Place 1 drop into both eyes at bedtime.     Turmeric (QC TUMERIC COMPLEX PO) Take 1 capsule by mouth daily.     No current facility-administered medications for this visit.    Musculoskeletal: Strength & Muscle Tone: within normal limits Gait & Station: normal Patient leans: N/A  Psychiatric Specialty Exam: ROS  There were no vitals taken for this visit.There is no height or weight on file to calculate BMI.  General Appearance: Casual  Eye Contact:  Good  Speech:  Clear and Coherent  Volume:  Normal  Mood:  Negative  Affect:  Appropriate  Thought Process:  Goal Directed  Orientation:  Full (Time, Place, and Person)  Thought Content:  WDL  Suicidal Thoughts:  No  Homicidal Thoughts:  No  Memory:  NA  Judgement:  Good  Insight:  NA and Good  Psychomotor Activity:  Normal  Concentration:    Recall:  Good  Fund of Knowledge:Good  Language: Good  Akathisia:  No  Handed:  Right  AIMS (if indicated):  not done  Assets:  Desire for Improvement  ADL's:  Intact  Cognition: WNL  Sleep:  Good   Screenings: PHQ2-9    [place] Office Visit from 03/11/2021 in [facility] from 02/04/2015 in [facility]  PHQ-2 Total Score 2 0  PHQ-9 Total Score 2 --       Assessment and Plan:   6/22/20221:28 PM   This patient's first problem is that of major depression in remission.  He will continue taking Prozac 20 mg.  At this point we had a long discussion about the opportunity to come back to see me if things change.  For now he is getting good services through his primary care doctor.  Today we both decided that he would no longer return to this clinic although that I would  always be available for him if things change.  For now I will continue prescribing his Prozac but this will be our last prescription.  He is agreeing to get his Prozac from his primary care doctor.  If there is a problem with this he knows to call us back and we will be glad to restart his care.  For now he is very stable and has been stable for a number of years.  At this time we will endocrine care.  He will not receive a return appointment.

## 2021-04-09 NOTE — Telephone Encounter (Signed)
Pt advised to wait until Tommi Rumps comes in to approve referral. PT stated he has already had PT as noted that would be the next step via notes. PT advised that the next step was not noted so he would have to wait or see another provider for referral if needing it sooner since provider is out of office. PT stated he will wait for Tommi Rumps to return. No further actions needed at this time. Routing to PCP for advise

## 2021-04-14 ENCOUNTER — Other Ambulatory Visit (HOSPITAL_COMMUNITY): Payer: Self-pay | Admitting: *Deleted

## 2021-04-14 ENCOUNTER — Telehealth: Payer: Self-pay | Admitting: Internal Medicine

## 2021-04-14 MED ORDER — METRONIDAZOLE 250 MG PO TABS: 250.0000 mg | ORAL_TABLET | Freq: Three times a day (TID) | ORAL | 0 refills | Status: DC

## 2021-04-14 MED ORDER — FLUOXETINE HCL 20 MG PO CAPS: 20.0000 mg | ORAL_CAPSULE | Freq: Every day | ORAL | 2 refills | Status: DC

## 2021-04-14 NOTE — Telephone Encounter (Signed)
Patient continues to have belching, flatulence, and diarrhea.  No improvement since appointment in April.  Diarrhea for the last 10 days.  He reports 5-6 episodes a day.  No recent sick contacts, travel, med changes, or antibiotics.  He reports he is taking dicyclomine 20 BID.  Do you have any additional recommendations. He is scheduled for an endo/colon on 7/20.  He is seeking tx to better control symptoms until procedure.

## 2021-04-14 NOTE — Telephone Encounter (Signed)
Patient calling to inform he is experiencing diarrhea//belching. Pt wants to know how to treat sxs.. Plz advise   thank you

## 2021-04-14 NOTE — Telephone Encounter (Signed)
I sent in metronidazole Rx to see if that helps  And I sent him a my chart message to let him know

## 2021-04-14 NOTE — Telephone Encounter (Signed)
Please advise 

## 2021-04-17 NOTE — Telephone Encounter (Signed)
Noted  

## 2021-04-17 NOTE — Telephone Encounter (Signed)
Patient is scheduled with Jeffrey Hudson on 07/14 at 9:30 AM

## 2021-04-30 ENCOUNTER — Encounter: Payer: Self-pay | Admitting: Adult Health

## 2021-04-30 ENCOUNTER — Ambulatory Visit (INDEPENDENT_AMBULATORY_CARE_PROVIDER_SITE_OTHER): Payer: Medicare Other | Admitting: Adult Health

## 2021-04-30 ENCOUNTER — Other Ambulatory Visit: Payer: Self-pay

## 2021-04-30 VITALS — BP 120/70 | HR 63 | Temp 97.0°F | Ht 65.0 in | Wt 152.0 lb

## 2021-04-30 DIAGNOSIS — M1990 Unspecified osteoarthritis, unspecified site: Secondary | ICD-10-CM

## 2021-04-30 DIAGNOSIS — R7303 Prediabetes: Secondary | ICD-10-CM | POA: Diagnosis not present

## 2021-04-30 LAB — SEDIMENTATION RATE: Sed Rate: 5 mm/hr (ref 0–20)

## 2021-04-30 LAB — C-REACTIVE PROTEIN: CRP: <1 mg/dL (ref 0.5–20.0)

## 2021-04-30 NOTE — Progress Notes (Signed)
Subjective:    Patient ID: Jeffrey Hudson, male    DOB: 01/12/1951, 70 y.o.   MRN: [id number]  HPI 70 year old male who  has a past medical history of AMI (acute myocardial infarction) ([place]), Anxiety, Arthritis, Bipolar disorder ([place]), COPD (chronic obstructive pulmonary disease) ([place]), Coronary artery disease, Hyperlipidemia, Hypertension, Nodule of left lung, Plantar fasciitis, Pre-diabetes, Syncope and collapse, Tobacco abuse, and Viral URI with cough (11/22/2016).  70 year old male who is following up today for multiple issues.  Pre diabetes -when he was seen in March 2022 his A1c was 6.4.  He was placed on metformin 500 mg daily and advised to make lifestyle modifications.  He does report that he has been taking his medication daily for the last few months and has started to eat more fruits and vegetables.  Unfortunately over the last 3 months he has experienced more diarrhea than in the past.   Arthritis-known in cervical spine and in his left foot.  Recently went through physical therapy for neck pain and did have a good response with this but feels as though the pain is coming back.  Reports that over the last 6 months he has developed discomfort in the bases of both thumbs.  Does feel as though he is losing grip strength.  Has a prescription for Mobic but does not like to take this on a regular basis.  He would like to be checked for forms of arthritis.   Review of Systems See HPI   Past Medical History:  Diagnosis Date   AMI (acute myocardial infarction) ([place])    Acute ST elevation   Anxiety    Arthritis    neck   Bipolar disorder ([place])    COPD (chronic obstructive pulmonary disease) ([place])    Coronary artery disease    a. s/p anterolateral STEMI with BMS placed in large diagonal branch (2009).   Hyperlipidemia    Hypertension    Nodule of left lung    6-mm smooothly rounded nodule   Plantar fasciitis    left foot   Pre-diabetes    Syncope and collapse    in 2001 and  2004 with no clear cause   Tobacco abuse    Viral URI with cough 11/22/2016    Social History   Socioeconomic History   Marital status: Married    Spouse name: Not on file   Number of children: 1   Years of education: Not on file   Highest education level: Not on file  Occupational History   Occupation: Retired-Sales/broker  Tobacco Use   Smoking status: Every Day    Packs/day: 0.50    Years: 45.00    Pack years: 22.50    Types: Cigarettes   Smokeless tobacco: Never  Vaping Use   Vaping Use: Never used  Substance and Sexual Activity   Alcohol use: Yes    Alcohol/week: 1.0 standard drink    Types: 1 Standard drinks or equivalent per week    Comment: 2 glasses of wine daily   Drug use: No    Comment: marijuana quit 2010. He has hx of THC use.   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 - plays once a  week    2 glasses of wine a day max, still smoking 2 cups of coffee a day no drug use or other tobacco   Social Determinants of Radio broadcast assistant Strain: Not on file  Food Insecurity: Not on file  Transportation Needs: Not on file  Physical Activity: Not on file  Stress: Not on file  Social Connections: Not on file  Intimate Partner Violence: Not on file    Past Surgical History:  Procedure Laterality Date   COLONOSCOPY     PERCUTANEOUS CORONARY STENT INTERVENTION (PCI-S)      Family History  Problem Relation Age of Onset   Dementia Mother 72       died   Alcohol abuse Father 75   Depression Brother    Colon cancer Neg Hx    Esophageal cancer Neg Hx    Rectal cancer Neg Hx     Allergies  Allergen Reactions   Lisinopril Swelling    Angio edema    Norvasc [Amlodipine] Swelling    Lower extremity     Current Outpatient Medications  on File Prior to Visit  Medication Sig Dispense Refill   albuterol (PROAIR HFA) 108 (90 Base) MCG/ACT inhaler Inhale 2 puffs into the lungs every 6 (six) hours as needed. 1 Inhaler 3   aspirin 81 MG tablet Take 81 mg by mouth daily.     atorvastatin (LIPITOR) 80 MG tablet TAKE 1 TABLET BY MOUTH  DAILY AT 6 PM 90 tablet 3   betamethasone dipropionate 0.05 % cream as needed.      dicyclomine (BENTYL) 10 MG capsule Take 1 capsule (10 mg total) by mouth 3 (three) times daily before meals. 90 capsule 0   ezetimibe (ZETIA) 10 MG tablet Take 1 tablet (10 mg total) by mouth daily. 90 tablet 3   FLUoxetine (PROZAC) 20 MG capsule Take 1 capsule (20 mg total) by mouth daily. 90 capsule 2   Glycopyrrolate-Formoterol (BEVESPI AEROSPHERE) 9-4.8 MCG/ACT AERO Inhale 2 puffs into the lungs 2 (two) times daily. (Patient taking differently: Inhale 2 puffs into the lungs as needed.) 10.7 g 5   hydrochlorothiazide (MICROZIDE) 12.5 MG capsule Take 1 capsule (12.5 mg total) by mouth 2 (two) times daily. 180 capsule 2   meloxicam (MOBIC) 7.5 MG tablet Take 7.5 mg by mouth as needed for pain.     metoprolol succinate (TOPROL-XL) 25 MG 24 hr tablet TAKE 1 TABLET BY MOUTH  DAILY. 90 tablet 3   metroNIDAZOLE (FLAGYL) 250 MG tablet Take 1 tablet (250 mg total) by mouth 3 (three) times daily. 30 tablet 0   Multiple Vitamin (MULTIVITAMIN) tablet Take 1 tablet by mouth daily.     nitroGLYCERIN (NITROSTAT) 0.4 MG SL tablet Place 0.4 mg under the tongue every 5 (five) minutes as needed for chest pain.     Omeprazole Magnesium (PRILOSEC PO) Take by mouth daily. OTC     timolol (BETIMOL) 0.25 % ophthalmic solution Place 1-2 drops into both eyes daily.     travoprost, benzalkonium, (TRAVATAN) 0.004 % ophthalmic solution Place 1 drop into both eyes at bedtime.     Turmeric (QC TUMERIC COMPLEX PO) Take 1 capsule by mouth daily.     latanoprost (XALATAN) 0.005 % ophthalmic solution SMARTSIG:1 Drop(s) In Eye(s) Every Evening     No  current facility-administered medications on file prior to visit.    BP 120/70   Pulse 63   Temp (!) 97 F (36.1 C) (Oral)   Ht 5\' 5"  (  1.651 m)   Wt 152 lb (68.9 kg)   SpO2 97%   BMI 25.29 kg/m       Objective:   Physical Exam Vitals and nursing note reviewed.  Constitutional:      Appearance: Normal appearance.  Cardiovascular:     Rate and Rhythm: Normal rate and regular rhythm.     Pulses: Normal pulses.     Heart sounds: Normal heart sounds.  Pulmonary:     Effort: Pulmonary effort is normal.     Breath sounds: Normal breath sounds.  Skin:    General: Skin is warm and dry.     Capillary Refill: Capillary refill takes less than 2 seconds.  Neurological:     General: No focal deficit present.     Mental Status: He is alert and oriented to person, place, and time.  Psychiatric:        Mood and Affect: Mood normal.        Behavior: Behavior normal.        Thought Content: Thought content normal.        Judgment: Judgment normal.      Assessment & Plan:  1. Prediabetes - A1c today is 5.4. Will have him d/c metformin as this is likely the cause of his diarrhea.  - Follow up in 3-6 months for retest - POC HgB A1c  2. Arthritis  - ANA; Future - Sedimentation Rate; Future - C-reactive Protein; Future - Rheumatoid Factor; Future - DG Hand Complete Right; Future - DG Hand Complete Left; Future - ANA - Sedimentation Rate - C-reactive Protein - Rheumatoid Factor  Dorothyann Peng, NP

## 2021-04-30 NOTE — Patient Instructions (Signed)
Health Maintenance Due  Topic Date Due   Zoster Vaccines- Shingrix (1 of 2) Never done   COLONOSCOPY (Pts 45-35yrs Insurance coverage will need to be confirmed)  10/27/2010    Depression screen [facility] 2/9 03/11/2021 02/04/2015  Decreased Interest 0 0  Down, Depressed, Hopeless 2 0  PHQ - 2 Score 2 0  Altered sleeping 0 -  Tired, decreased energy 0 -  Change in appetite 0 -  Feeling bad or failure about yourself  0 -  Trouble concentrating 0 -  Moving slowly or fidgety/restless 0 -  Suicidal thoughts 0 -  PHQ-9 Score 2 -  Difficult doing work/chores Not difficult at all -

## 2021-05-01 LAB — POCT GLYCOSYLATED HEMOGLOBIN (HGB A1C): Hemoglobin A1C: 5.4 % (ref 4.0–5.6)

## 2021-05-03 LAB — RHEUMATOID FACTOR: Rheumatoid fact SerPl-aCnc: <14 IU/mL (ref <14)

## 2021-05-03 LAB — ANA: Anti Nuclear Antibody (ANA): NEGATIVE

## 2021-05-06 ENCOUNTER — Other Ambulatory Visit: Payer: Self-pay

## 2021-05-06 ENCOUNTER — Encounter: Payer: Self-pay | Admitting: Internal Medicine

## 2021-05-06 ENCOUNTER — Ambulatory Visit (AMBULATORY_SURGERY_CENTER): Payer: Medicare Other | Admitting: Internal Medicine

## 2021-05-06 VITALS — BP 130/83 | HR 80 | Temp 97.3°F | Resp 16 | Ht 65.0 in | Wt 160.0 lb

## 2021-05-06 DIAGNOSIS — K2210 Ulcer of esophagus without bleeding: Secondary | ICD-10-CM

## 2021-05-06 DIAGNOSIS — K2990 Gastroduodenitis, unspecified, without bleeding: Secondary | ICD-10-CM

## 2021-05-06 DIAGNOSIS — K319 Disease of stomach and duodenum, unspecified: Secondary | ICD-10-CM | POA: Diagnosis not present

## 2021-05-06 DIAGNOSIS — K2100 Gastro-esophageal reflux disease with esophagitis, without bleeding: Secondary | ICD-10-CM

## 2021-05-06 DIAGNOSIS — J449 Chronic obstructive pulmonary disease, unspecified: Secondary | ICD-10-CM | POA: Diagnosis not present

## 2021-05-06 DIAGNOSIS — I10 Essential (primary) hypertension: Secondary | ICD-10-CM | POA: Diagnosis not present

## 2021-05-06 DIAGNOSIS — Z1211 Encounter for screening for malignant neoplasm of colon: Secondary | ICD-10-CM | POA: Diagnosis not present

## 2021-05-06 DIAGNOSIS — K219 Gastro-esophageal reflux disease without esophagitis: Secondary | ICD-10-CM | POA: Diagnosis not present

## 2021-05-06 DIAGNOSIS — D120 Benign neoplasm of cecum: Secondary | ICD-10-CM | POA: Diagnosis not present

## 2021-05-06 DIAGNOSIS — R12 Heartburn: Secondary | ICD-10-CM

## 2021-05-06 DIAGNOSIS — R142 Eructation: Secondary | ICD-10-CM

## 2021-05-06 DIAGNOSIS — K2970 Gastritis, unspecified, without bleeding: Secondary | ICD-10-CM | POA: Diagnosis not present

## 2021-05-06 DIAGNOSIS — D125 Benign neoplasm of sigmoid colon: Secondary | ICD-10-CM | POA: Diagnosis not present

## 2021-05-06 DIAGNOSIS — Z860101 Personal history of adenomatous and serrated colon polyps: Secondary | ICD-10-CM

## 2021-05-06 DIAGNOSIS — Z86010 Personal history of colonic polyps: Secondary | ICD-10-CM

## 2021-05-06 DIAGNOSIS — I2510 Atherosclerotic heart disease of native coronary artery without angina pectoris: Secondary | ICD-10-CM | POA: Diagnosis not present

## 2021-05-06 HISTORY — DX: Personal history of adenomatous and serrated colon polyps: Z86.0101

## 2021-05-06 HISTORY — DX: Personal history of colonic polyps: Z86.010

## 2021-05-06 MED ORDER — SODIUM CHLORIDE 0.9 % IV SOLN: 500.0000 mL | Freq: Once | INTRAVENOUS | Status: DC

## 2021-05-06 NOTE — Progress Notes (Signed)
Called to room to assist during endoscopic procedure.  Patient ID and intended procedure confirmed with present staff. Received instructions for my participation in the procedure from the performing physician.  

## 2021-05-06 NOTE — Op Note (Signed)
[facility] Patient Name: Jeffrey Hudson Procedure Date: 05/06/2021 8:21 AM MRN: [id number] Endoscopist: Gatha Mayer , MD Age: 70 Referring MD:  Date of Birth: 03/26/1951 Gender: Male Account #: [id number] Procedure:                Colonoscopy Indications:              Screening for colorectal malignant neoplasm, Last                            colonoscopy: 2002 Medicines:                Propofol per Anesthesia, Monitored Anesthesia Care Procedure:                Pre-Anesthesia Assessment:                           - Prior to the procedure, a History and Physical                            was performed, and patient medications and                            allergies were reviewed. The patient's tolerance of                            previous anesthesia was also reviewed. The risks                            and benefits of the procedure and the sedation                            options and risks were discussed with the patient.                            All questions were answered, and informed consent                            was obtained. Prior Anticoagulants: The patient has                            taken no previous anticoagulant or antiplatelet                            agents. ASA Grade Assessment: III - A patient with                            severe systemic disease. After reviewing the risks                            and benefits, the patient was deemed in                            satisfactory condition to undergo the procedure.  After obtaining informed consent, the colonoscope                            was passed under direct vision. Throughout the                            procedure, the patient's blood pressure, pulse, and                            oxygen saturations were monitored continuously. The                            Olympus CF-HQ190L ([id number]) Colonoscope was                            introduced through  the anus and advanced to the the                            cecum, identified by appendiceal orifice and                            ileocecal valve. The colonoscopy was performed                            without difficulty. The patient tolerated the                            procedure well. The quality of the bowel                            preparation was good. The ileocecal valve,                            appendiceal orifice, and rectum were photographed.                            The bowel preparation used was Miralax via split                            dose instruction. Scope In: 8:44:19 AM Scope Out: 8:58:23 AM Scope Withdrawal Time: 0 hours 8 minutes 49 seconds  Total Procedure Duration: 0 hours 14 minutes 4 seconds  Findings:                 The perianal and digital rectal examinations were                            normal. Pertinent negatives include normal prostate                            (size, shape, and consistency).                           Two sessile polyps were found in the proximal  sigmoid colon and cecum. The polyps were diminutive                            in size. These polyps were removed with a cold                            snare. Resection and retrieval were complete.                            Verification of patient identification for the                            specimen was done. Estimated blood loss was minimal.                           Multiple diverticula were found in the sigmoid                            colon.                           External hemorrhoids were found.                           The exam was otherwise without abnormality on                            direct and retroflexion views. Complications:            No immediate complications. Estimated Blood Loss:     Estimated blood loss was minimal. Impression:               - Two diminutive polyps in the proximal sigmoid                             colon and in the cecum, removed with a cold snare.                            Resected and retrieved.                           - Diverticulosis in the sigmoid colon.                           - External hemorrhoids.                           - The examination was otherwise normal on direct                            and retroflexion views. Recommendation:           - Patient has a contact number available for                            emergencies. The signs and symptoms of potential  delayed complications were discussed with the                            patient. Return to normal activities tomorrow.                            Written discharge instructions were provided to the                            patient.                           - Resume previous diet. GERD precautions                           - No recommendation at this time regarding repeat                            colonoscopy due to age.                           - Await pathology results. Gatha Mayer, MD 05/06/2021 9:11:03 AM This report has been signed electronically.

## 2021-05-06 NOTE — Patient Instructions (Addendum)
There was esophageal and stomach inflammation. Looks like GERD and gastritis. I took biopsies to sort out next steps but nothing dangerous seen.  Two colon polyps removed that are tiny and look benign. Probably will not need a routine repeat colonoscopy.  You also have a condition called diverticulosis - common and not usually a problem. Please read the handout provided.  Hemorrhoids also seen.  I appreciate the opportunity to care for you. Gatha Mayer, MD, [facility]   Discharge instructions given. Handouts on polyps,diverticulosis and hemorrhoids. Biopsies taken. Resume previous medications. YOU HAD AN ENDOSCOPIC PROCEDURE TODAY AT [facility]:   Refer to the procedure report that was given to you for any specific questions about what was found during the examination.  If the procedure report does not answer your questions, please call your gastroenterologist to clarify.  If you requested that your care partner not be given the details of your procedure findings, then the procedure report has been included in a sealed envelope for you to review at your convenience later.  YOU SHOULD EXPECT: Some feelings of bloating in the abdomen. Passage of more gas than usual.  Walking can help get rid of the air that was put into your GI tract during the procedure and reduce the bloating. If you had a lower endoscopy (such as a colonoscopy or flexible sigmoidoscopy) you may notice spotting of blood in your stool or on the toilet paper. If you underwent a bowel prep for your procedure, you may not have a normal bowel movement for a few days.  Please Note:  You might notice some irritation and congestion in your nose or some drainage.  This is from the oxygen used during your procedure.  There is no need for concern and it should clear up in a day or so.  SYMPTOMS TO REPORT IMMEDIATELY:  Following lower endoscopy (colonoscopy or flexible sigmoidoscopy):  Excessive amounts of blood in the  stool  Significant tenderness or worsening of abdominal pains  Swelling of the abdomen that is new, acute  Fever of 100F or higher  Following upper endoscopy (EGD)  Vomiting of blood or coffee ground material  New chest pain or pain under the shoulder blades  Painful or persistently difficult swallowing  New shortness of breath  Fever of 100F or higher  Black, tarry-looking stools  For urgent or emergent issues, a gastroenterologist can be reached at any hour by calling [phone]. Do not use MyChart messaging for urgent concerns.    DIET:  We do recommend a small meal at first, but then you may proceed to your regular diet.  Drink plenty of fluids but you should avoid alcoholic beverages for 24 hours.  ACTIVITY:  You should plan to take it easy for the rest of today and you should NOT DRIVE or use heavy machinery until tomorrow (because of the sedation medicines used during the test).    FOLLOW UP: Our staff will call the number listed on your records 48-72 hours following your procedure to check on you and address any questions or concerns that you may have regarding the information given to you following your procedure. If we do not reach you, we will leave a message.  We will attempt to reach you two times.  During this call, we will ask if you have developed any symptoms of COVID 19. If you develop any symptoms (ie: fever, flu-like symptoms, shortness of breath, cough etc.) before then, please call [phone].  If you  test positive for Covid 19 in the 2 weeks post procedure, please call and report this information to [place].    If any biopsies were taken you will be contacted by phone or by letter within the next 1-3 weeks.  Please call us at [phone] if you have not heard about the biopsies in 3 weeks.    SIGNATURES/CONFIDENTIALITY: You and/or your care partner have signed paperwork which will be entered into your electronic medical record.  These signatures attest to the  fact that that the information above on your After Visit Summary has been reviewed and is understood.  Full responsibility of the confidentiality of this discharge information lies with you and/or your care-partner.

## 2021-05-06 NOTE — Progress Notes (Signed)
Report to PACU, RN, vss, BBS= Clear.  

## 2021-05-06 NOTE — Progress Notes (Signed)
VS-CW 

## 2021-05-06 NOTE — Op Note (Signed)
[facility] Patient Name: Jeffrey Hudson Procedure Date: 05/06/2021 8:21 AM MRN: [id number] Endoscopist: Gatha Mayer , MD Age: 70 Referring MD:  Date of Birth: [birth year] Gender: Male Account #: [id number] Procedure:                Upper GI endoscopy Indications:              Heartburn Medicines:                Propofol per Anesthesia, Monitored Anesthesia Care Procedure:                Pre-Anesthesia Assessment:                           - Prior to the procedure, a History and Physical                            was performed, and patient medications and                            allergies were reviewed. The patient's tolerance of                            previous anesthesia was also reviewed. The risks                            and benefits of the procedure and the sedation                            options and risks were discussed with the patient.                            All questions were answered, and informed consent                            was obtained. Prior Anticoagulants: The patient has                            taken no previous anticoagulant or antiplatelet                            agents. ASA Grade Assessment: III - A patient with                            severe systemic disease. After reviewing the risks                            and benefits, the patient was deemed in                            satisfactory condition to undergo the procedure.                           After obtaining informed consent, the endoscope was  passed under direct vision. Throughout the                            procedure, the patient's blood pressure, pulse, and                            oxygen saturations were monitored continuously. The                            GIF [id number] #[id number] was introduced through the                            mouth, and advanced to the second part of duodenum.                            The upper GI  endoscopy was accomplished without                            difficulty. The patient tolerated the procedure                            well. Scope In: Scope Out: Findings:                 LA Grade B (one or more mucosal breaks greater than                            5 mm, not extending between the tops of two mucosal                            folds) esophagitis with no bleeding was found in                            the distal esophagus. Biopsies were taken with a                            cold forceps for histology. Verification of patient                            identification for the specimen was done. Estimated                            blood loss was minimal.                           Diffuse inflammation characterized by erythema,                            friability and granularity was found in the gastric                            body and in the gastric antrum. Biopsies were taken  with a cold forceps for histology. Verification of                            patient identification for the specimen was done.                            Estimated blood loss was minimal.                           The cardia and gastric fundus were normal on                            retroflexion.                           The exam was otherwise without abnormality. Complications:            No immediate complications. Estimated Blood Loss:     Estimated blood loss was minimal. Impression:               - LA Grade B reflux esophagitis with no bleeding.                            Biopsied.                           - Gastritis. Biopsied.                           - The examination was otherwise normal. Recommendation:           - Patient has a contact number available for                            emergencies. The signs and symptoms of potential                            delayed complications were discussed with the                            patient. Return to  normal activities tomorrow.                            Written discharge instructions were provided to the                            patient.                           - Continue present medications.                           - Await pathology results.                           - See the other procedure note for documentation of  additional recommendations.                           Follow an antireflux regimen. This includes:                           - Do not lie down for at least 3 to 4 hours after                            meals.                           - Raise the head of the bed 4 to 6 inches.                           - Decrease excess weight.                           - Avoid citrus juices and other acidic foods,                            alcohol, chocolate, mints, coffee and other                            caffeinated beverages, carbonated beverages, fatty                            and fried foods.                           - Avoid tight-fitting clothing.                           - Avoid cigarettes and other tobacco products. Gatha Mayer, MD 05/06/2021 9:07:32 AM This report has been signed electronically.

## 2021-05-08 ENCOUNTER — Telehealth: Payer: Self-pay

## 2021-05-08 NOTE — Telephone Encounter (Signed)
  Follow up Call-  Call back number 05/06/2021  Post procedure Call Back phone  # [phone]  Permission to leave phone message Yes  Some recent data might be hidden     Patient questions:  Do you have a fever, pain , or abdominal swelling? No. Pain Score  0 *  Have you tolerated food without any problems? Yes.    Have you been able to return to your normal activities? Yes.    Do you have any questions about your discharge instructions: Diet   No. Medications  No. Follow up visit  No.  Do you have questions or concerns about your Care? No.  Actions: * If pain score is 4 or above: No action needed, pain <4. Have you developed a fever since your procedure? no  2.   Have you had an respiratory symptoms (SOB or cough) since your procedure? no  3.   Have you tested positive for COVID 19 since your procedure no  4.   Have you had any family members/close contacts diagnosed with the COVID 19 since your procedure?  no   If yes to any of these questions please route to Joylene John, RN and Joella Prince, RN

## 2021-05-15 ENCOUNTER — Ambulatory Visit: Payer: Medicare Other | Admitting: Podiatry

## 2021-05-15 ENCOUNTER — Ambulatory Visit (INDEPENDENT_AMBULATORY_CARE_PROVIDER_SITE_OTHER)
Admission: RE | Admit: 2021-05-15 | Discharge: 2021-05-15 | Disposition: A | Discharge status: Home / Self Care | Payer: Medicare Other | Source: Ambulatory Visit | Attending: Adult Health | Admitting: Adult Health

## 2021-05-15 ENCOUNTER — Other Ambulatory Visit: Payer: Self-pay

## 2021-05-15 ENCOUNTER — Encounter: Payer: Self-pay | Admitting: Podiatry

## 2021-05-15 DIAGNOSIS — M1812 Unilateral primary osteoarthritis of first carpometacarpal joint, left hand: Secondary | ICD-10-CM | POA: Diagnosis not present

## 2021-05-15 DIAGNOSIS — B351 Tinea unguium: Secondary | ICD-10-CM

## 2021-05-15 DIAGNOSIS — M1990 Unspecified osteoarthritis, unspecified site: Secondary | ICD-10-CM

## 2021-05-15 DIAGNOSIS — M79676 Pain in unspecified toe(s): Secondary | ICD-10-CM | POA: Diagnosis not present

## 2021-05-15 DIAGNOSIS — M1811 Unilateral primary osteoarthritis of first carpometacarpal joint, right hand: Secondary | ICD-10-CM | POA: Diagnosis not present

## 2021-05-15 NOTE — Progress Notes (Signed)
This patient returns to the office for evaluation and treatment of long thick painful nails .  This patient is unable to trim his own nails since the patient cannot reach the feet.  Patient says the nails are painful walking and wearing his shoes.  He returns for preventive foot care services.  General Appearance  Alert, conversant and in no acute stress.  Vascular  Dorsalis pedis and posterior tibial  pulses are palpable  bilaterally.  Capillary return is within normal limits  bilaterally. Temperature is within normal limits  bilaterally.  Neurologic  Senn-Weinstein monofilament wire test within normal limits  bilaterally. Muscle power within normal limits bilaterally.  Nails Thick disfigured discolored nails with subungual debris  from hallux to fifth toes bilaterally. No evidence of bacterial infection or drainage bilaterally.  Orthopedic  No limitations of motion  feet .  No crepitus or effusions noted.  No bony pathology or digital deformities noted.  Skin  normotropic skin with no porokeratosis noted bilaterally.  No signs of infections or ulcers noted.     Onychomycosis  Pain in toes right foot  Pain in toes left foot  Debridement  of nails  1-5  B/L with a nail nipper.  Nails were then filed using a dremel tool with no incidents.    RTC 3 months    Jayro Mcmath DPM  

## 2021-05-20 ENCOUNTER — Other Ambulatory Visit: Payer: Self-pay | Admitting: Internal Medicine

## 2021-05-20 ENCOUNTER — Encounter: Payer: Self-pay | Admitting: Internal Medicine

## 2021-05-20 MED ORDER — PANTOPRAZOLE SODIUM 40 MG PO TBEC: 40.0000 mg | DELAYED_RELEASE_TABLET | Freq: Every day | ORAL | 3 refills | Status: DC

## 2021-05-22 ENCOUNTER — Ambulatory Visit: Payer: Medicare Other

## 2021-05-28 ENCOUNTER — Other Ambulatory Visit: Payer: Self-pay | Admitting: Cardiovascular Disease

## 2021-06-02 ENCOUNTER — Other Ambulatory Visit: Payer: Self-pay

## 2021-06-02 ENCOUNTER — Ambulatory Visit (INDEPENDENT_AMBULATORY_CARE_PROVIDER_SITE_OTHER): Payer: Medicare Other

## 2021-06-02 DIAGNOSIS — Z0000 Encounter for general adult medical examination without abnormal findings: Secondary | ICD-10-CM | POA: Diagnosis not present

## 2021-06-02 NOTE — Patient Instructions (Signed)
Jeffrey Hudson , Thank you for taking time to come for your Medicare Wellness Visit. I appreciate your ongoing commitment to your health goals. Please review the following plan we discussed and let me know if I can assist you in the future.   Screening recommendations/referrals: Colonoscopy: Done 05/06/21, completed Recommended yearly ophthalmology/optometry visit for glaucoma screening and checkup Recommended yearly dental visit for hygiene and checkup  Vaccinations: Influenza vaccine: due Pneumococcal vaccine: completed Tdap vaccine: done 02/04/15 Shingles vaccine:Completed  06/21/2019 & 10/05/2019   Covid-19: Completed 2/14, 3/9, 07/18/20 & 01/31/21  Advanced directives: Please bring a copy of your health care power of attorney and living will to the office at your convenience.  Conditions/risks identified: None at this time   Next appointment: Follow up in one year for your annual wellness visit.   Preventive Care 72 Years and Older, Male Preventive care refers to lifestyle choices and visits with your health care provider that can promote health and wellness. What does preventive care include? A yearly physical exam. This is also called an annual well check. Dental exams once or twice a year. Routine eye exams. Ask your health care provider how often you should have your eyes checked. Personal lifestyle choices, including: Daily care of your teeth and gums. Regular physical activity. Eating a healthy diet. Avoiding tobacco and drug use. Limiting alcohol use. Practicing safe sex. Taking low doses of aspirin every day. Taking vitamin and mineral supplements as recommended by your health care provider. What happens during an annual well check? The services and screenings done by your health care provider during your annual well check will depend on your age, overall health, lifestyle risk factors, and family history of disease. Counseling  Your health care provider may ask you  questions about your: Alcohol use. Tobacco use. Drug use. Emotional well-being. Home and relationship well-being. Sexual activity. Eating habits. History of falls. Memory and ability to understand (cognition). Work and work Statistician. Screening  You may have the following tests or measurements: Height, weight, and BMI. Blood pressure. Lipid and cholesterol levels. These may be checked every 5 years, or more frequently if you are over 35 years old. Skin check. Lung cancer screening. You may have this screening every year starting at age 72 if you have a 30-pack-year history of smoking and currently smoke or have quit within the past 15 years. Fecal occult blood test (FOBT) of the stool. You may have this test every year starting at age 72. Flexible sigmoidoscopy or colonoscopy. You may have a sigmoidoscopy every 5 years or a colonoscopy every 10 years starting at age 72. Prostate cancer screening. Recommendations will vary depending on your family history and other risks. Hepatitis C blood test. Hepatitis B blood test. Sexually transmitted disease (STD) testing. Diabetes screening. This is done by checking your blood sugar (glucose) after you have not eaten for a while (fasting). You may have this done every 1-3 years. Abdominal aortic aneurysm (AAA) screening. You may need this if you are a current or former smoker. Osteoporosis. You may be screened starting at age 72 if you are at high risk. Talk with your health care provider about your test results, treatment options, and if necessary, the need for more tests. Vaccines  Your health care provider may recommend certain vaccines, such as: Influenza vaccine. This is recommended every year. Tetanus, diphtheria, and acellular pertussis (Tdap, Td) vaccine. You may need a Td booster every 10 years. Zoster vaccine. You may need this after age 72. Pneumococcal  13-valent conjugate (PCV13) vaccine. One dose is recommended after age  72. Pneumococcal polysaccharide (PPSV23) vaccine. One dose is recommended after age 72. Talk to your health care provider about which screenings and vaccines you need and how often you need them. This information is not intended to replace advice given to you by your health care provider. Make sure you discuss any questions you have with your health care provider. Document Released: 10/31/2015 Document Revised: 06/23/2016 Document Reviewed: 08/05/2015 Elsevier Interactive Patient Education  2017 [employer] Prevention in the Home Falls can cause injuries. They can happen to people of all ages. There are many things you can do to make your home safe and to help prevent falls. What can I do on the outside of my home? Regularly fix the edges of walkways and driveways and fix any cracks. Remove anything that might make you trip as you walk through a door, such as a raised step or threshold. Trim any bushes or trees on the path to your home. Use bright outdoor lighting. Clear any walking paths of anything that might make someone trip, such as rocks or tools. Regularly check to see if handrails are loose or broken. Make sure that both sides of any steps have handrails. Any raised decks and porches should have guardrails on the edges. Have any leaves, snow, or ice cleared regularly. Use sand or salt on walking paths during winter. Clean up any spills in your garage right away. This includes oil or grease spills. What can I do in the bathroom? Use night lights. Install grab bars by the toilet and in the tub and shower. Do not use towel bars as grab bars. Use non-skid mats or decals in the tub or shower. If you need to sit down in the shower, use a plastic, non-slip stool. Keep the floor dry. Clean up any water that spills on the floor as soon as it happens. Remove soap buildup in the tub or shower regularly. Attach bath mats securely with double-sided non-slip rug tape. Do not have throw  rugs and other things on the floor that can make you trip. What can I do in the bedroom? Use night lights. Make sure that you have a light by your bed that is easy to reach. Do not use any sheets or blankets that are too big for your bed. They should not hang down onto the floor. Have a firm chair that has side arms. You can use this for support while you get dressed. Do not have throw rugs and other things on the floor that can make you trip. What can I do in the kitchen? Clean up any spills right away. Avoid walking on wet floors. Keep items that you use a lot in easy-to-reach places. If you need to reach something above you, use a strong step stool that has a grab bar. Keep electrical cords out of the way. Do not use floor polish or wax that makes floors slippery. If you must use wax, use non-skid floor wax. Do not have throw rugs and other things on the floor that can make you trip. What can I do with my stairs? Do not leave any items on the stairs. Make sure that there are handrails on both sides of the stairs and use them. Fix handrails that are broken or loose. Make sure that handrails are as long as the stairways. Check any carpeting to make sure that it is firmly attached to the stairs. Fix any carpet  that is loose or worn. Avoid having throw rugs at the top or bottom of the stairs. If you do have throw rugs, attach them to the floor with carpet tape. Make sure that you have a light switch at the top of the stairs and the bottom of the stairs. If you do not have them, ask someone to add them for you. What else can I do to help prevent falls? Wear shoes that: Do not have high heels. Have rubber bottoms. Are comfortable and fit you well. Are closed at the toe. Do not wear sandals. If you use a stepladder: Make sure that it is fully opened. Do not climb a closed stepladder. Make sure that both sides of the stepladder are locked into place. Ask someone to hold it for you, if  possible. Clearly mark and make sure that you can see: Any grab bars or handrails. First and last steps. Where the edge of each step is. Use tools that help you move around (mobility aids) if they are needed. These include: Canes. Walkers. Scooters. Crutches. Turn on the lights when you go into a dark area. Replace any light bulbs as soon as they burn out. Set up your furniture so you have a clear path. Avoid moving your furniture around. If any of your floors are uneven, fix them. If there are any pets around you, be aware of where they are. Review your medicines with your doctor. Some medicines can make you feel dizzy. This can increase your chance of falling. Ask your doctor what other things that you can do to help prevent falls. This information is not intended to replace advice given to you by your health care provider. Make sure you discuss any questions you have with your health care provider. Document Released: 07/31/2009 Document Revised: 03/11/2016 Document Reviewed: 11/08/2014 Elsevier Interactive Patient Education  2017 [employer].

## 2021-06-02 NOTE — Progress Notes (Signed)
Virtual Visit via Telephone Note  I connected with  Jeffrey Hudson on 06/02/21 at 10:15 AM EDT by telephone and verified that I am speaking with the correct person using two identifiers.  Medicare Annual Wellness visit completed telephonically due to Covid-19 pandemic.   Persons participating in this call: This Health Coach and this patient.   Location: Patient: Home Provider: Office   I discussed the limitations, risks, security and privacy concerns of performing an evaluation and management service by telephone and the availability of in person appointments. The patient expressed understanding and agreed to proceed.  Unable to perform video visit due to video visit attempted and failed and/or patient does not have video capability.   Some vital signs may be absent or patient reported.   Willette Brace, LPN   Subjective:   Jeffrey Hudson is a 70 y.o. male who presents for Medicare Annual/Subsequent preventive examination.  Review of Systems     Cardiac Risk Factors include: advanced age (>37mn, >>44women);hypertension;dyslipidemia;male gender;smoking/ tobacco exposure     Objective:    There were no vitals filed for this visit. There is no height or weight on file to calculate BMI.  Advanced Directives 06/02/2021 01/21/2021  Does Patient Have a Medical Advance Directive? Yes Yes  Type of Advance Directive Living will -    Current Medications (verified) Outpatient Encounter Medications as of 06/02/2021  Medication Sig   albuterol (PROAIR HFA) 108 (90 Base) MCG/ACT inhaler Inhale 2 puffs into the lungs every 6 (six) hours as needed.   aspirin 81 MG tablet Take 81 mg by mouth daily.   atorvastatin (LIPITOR) 80 MG tablet TAKE 1 TABLET BY MOUTH  DAILY AT 6 PM   betamethasone dipropionate 0.05 % cream as needed.    ezetimibe (ZETIA) 10 MG tablet Take 1 tablet (10 mg total) by mouth daily.   FLUoxetine (PROZAC) 20 MG capsule Take 1 capsule (20 mg total) by mouth daily.    Glycopyrrolate-Formoterol (BEVESPI AEROSPHERE) 9-4.8 MCG/ACT AERO Inhale 2 puffs into the lungs 2 (two) times daily.   hydrochlorothiazide (MICROZIDE) 12.5 MG capsule TAKE 1 CAPSULE BY MOUTH  TWICE DAILY   latanoprost (XALATAN) 0.005 % ophthalmic solution SMARTSIG:1 Drop(s) In Eye(s) Every Evening   meloxicam (MOBIC) 7.5 MG tablet Take 7.5 mg by mouth as needed for pain.   metoprolol succinate (TOPROL-XL) 25 MG 24 hr tablet TAKE 1 TABLET BY MOUTH  DAILY.   Multiple Vitamin (MULTIVITAMIN) tablet Take 1 tablet by mouth daily.   nitroGLYCERIN (NITROSTAT) 0.4 MG SL tablet Place 0.4 mg under the tongue every 5 (five) minutes as needed for chest pain.   pantoprazole (PROTONIX) 40 MG tablet Take 1 tablet (40 mg total) by mouth daily before breakfast.   timolol (BETIMOL) 0.25 % ophthalmic solution Place 1-2 drops into both eyes daily.   travoprost, benzalkonium, (TRAVATAN) 0.004 % ophthalmic solution Place 1 drop into both eyes at bedtime.   PFIZER-BIONT COVID-19 VAC-TRIS SUSP injection    [DISCONTINUED] dicyclomine (BENTYL) 10 MG capsule Take 1 capsule (10 mg total) by mouth 3 (three) times daily before meals. (Patient not taking: Reported on 06/02/2021)   [DISCONTINUED] Turmeric (QC TUMERIC COMPLEX PO) Take 1 capsule by mouth daily. (Patient not taking: Reported on 06/02/2021)   No facility-administered encounter medications on file as of 06/02/2021.    Allergies (verified) Lisinopril and Norvasc [amlodipine]   History: Past Medical History:  Diagnosis Date   [place] ([place]) [place]    Acute ST elevation  Anxiety    Arthritis    neck   Bipolar disorder (HCC)    COPD (chronic obstructive pulmonary disease) (HCC)    Coronary artery disease    a. s/p anterolateral STEMI with BMS placed in large diagonal branch (2009).   Emphysema of lung ([place])    GERD (gastroesophageal reflux disease)    Glaucoma    Hx of adenomatous polyp of colon 05/06/2021   diminutive - no f/u needed    Hyperlipidemia    Hypertension    Nodule of left lung    6-mm smooothly rounded nodule   Plantar fasciitis    left foot   Pre-diabetes    Syncope and collapse    in 2001 and 2004 with no clear cause   Tobacco abuse    Viral URI with cough 11/22/2016   Past Surgical History:  Procedure Laterality Date   COLONOSCOPY     PERCUTANEOUS CORONARY STENT INTERVENTION (PCI-S)     UPPER GASTROINTESTINAL ENDOSCOPY     Family History  Problem Relation Age of Onset   Dementia Mother 64       died   Alcohol abuse Father 62   Depression Brother    Colon cancer Neg Hx    Esophageal cancer Neg Hx    Rectal cancer Neg Hx    Stomach cancer Neg Hx    Social History   Socioeconomic History   Marital status: Married    Spouse name: Not on file   Number of children: 1   Years of education: Not on file   Highest education level: Not on file  Occupational History   Occupation: Retired-Sales/broker  Tobacco Use   Smoking status: Every Day    Packs/day: 0.25    Years: 45.00    Pack years: 11.25    Types: Cigarettes   Smokeless tobacco: Never  Vaping Use   Vaping Use: Never used  Substance and Sexual Activity   Alcohol use: Yes    Alcohol/week: 1.0 standard drink    Types: 1 Standard drinks or equivalent per week    Comment: 2 glasses of wine daily   Drug use: No    Comment: marijuana quit 2010. He has hx of THC use.   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 - plays once a week    2 glasses of wine a day max, still smoking 2 cups of coffee a day no drug use or other tobacco   Social Determinants of Radio broadcast assistant Strain: Low Risk    Difficulty of Paying Living Expenses: Not hard at all  Food Insecurity: No Food Insecurity   Worried About Paediatric nurse in the Last Year: Never true   Arboriculturist in the Last Year: Never true  Transportation Needs: No Transportation Needs   Lack of Transportation (Medical): No   Lack of Transportation (Non-Medical): No  Physical Activity: Inactive   Days of Exercise per Week: 0 days   Minutes of Exercise per Session: 0 min  Stress: No Stress Concern Present   Feeling of Stress : Not at all  Social Connections: Socially Isolated   Frequency of Communication with Friends and Family: Once a  week   Frequency of Social Gatherings with Friends and Family: Once a week   Attends Religious Services: Never   Marine scientist or Organizations: No   Attends Music therapist: Never   Marital Status: Married    Tobacco Counseling Ready to quit: Not Answered Counseling given: Not Answered   Clinical Intake:  Pre-visit preparation completed: Yes  Pain : No/denies pain     BMI - recorded: 26.63 Nutritional Status: BMI 25 -29 Overweight Nutritional Risks: None Diabetes: No  How often do you need to have someone help you when you read instructions, pamphlets, or other written materials from your doctor or pharmacy?: 1 - Never  Diabetic?No  Interpreter Needed?: No  Information entered by :: Charlott Rakes, LPN   Activities of Daily Living In your present state of health, do you have any difficulty performing the following activities: 06/02/2021 03/11/2021  Hearing? N N  Vision? N N  Difficulty concentrating or making decisions? N N  Walking or climbing stairs? N N  Dressing or bathing? N N  Doing errands, shopping? N N  Preparing Food and eating ? N -  Using the Toilet? N -  In the past six months, have you accidently leaked urine? N -  Do you have problems with loss of bowel control? N -  Managing your Medications? N -  Managing your Finances? N -  Housekeeping or managing your Housekeeping? N -  Some recent data might be hidden    Patient Care  Team: Dorothyann Peng, NP as PCP - General (Family Medicine) Burnell Blanks, MD as PCP - Cardiology (Cardiology)  Indicate any recent Medical Services you may have received from other than [facility] providers in the past year (date may be approximate).     Assessment:   This is a routine wellness examination for Kelani.  Hearing/Vision screen Hearing Screening - Comments:: Pt denies any hearing issues  Vision Screening - Comments:: Pt follows up with Dr Alois Cliche for annual eye exams   Dietary issues and exercise activities discussed: Current Exercise Habits: The patient does not participate in regular exercise at present   Goals Addressed             This Visit's Progress    Patient Stated       None at this time        Depression Screen PHQ 2/9 Scores 06/02/2021 03/11/2021 02/04/2015  PHQ - 2 Score 0 2 0  PHQ- 9 Score - 2 -    Fall Risk Fall Risk  06/02/2021 01/15/2021 02/04/2015  Falls in the past year? 0 0 No  Number falls in past yr: 0 0 -  Injury with Fall? 0 - -  Risk for fall due to : Impaired vision - -  Follow up Falls prevention discussed - -    FALL RISK PREVENTION PERTAINING TO THE HOME:  Any stairs in or around the home? Yes  If so, are there any without handrails? No  Home free of loose throw rugs in walkways, pet beds, electrical cords, etc? Yes  Adequate lighting in your home to reduce risk of falls? Yes   ASSISTIVE DEVICES UTILIZED TO PREVENT FALLS:  Life alert? No  Use of a cane, walker or w/c? No  Grab bars in the bathroom? No  Shower chair or bench in shower? No  Elevated toilet seat or a handicapped toilet? No   TIMED UP AND GO:  Was the test performed? No .  Cognitive Function:     6CIT Screen 06/02/2021  What Year? 0 points  What month? 0 points  What time? 0 points  Count back from 20 0 points  Months in reverse 0 points  Repeat phrase 0 points  Total Score 0    Immunizations Immunization History  Administered  Date(s) Administered   Fluad Quad(high Dose 65+) 06/21/2019   Influenza, High Dose Seasonal PF 08/06/2016, 08/23/2017, 06/29/2018, 06/21/2019   Influenza,inj,Quad PF,6+ Mos 08/03/2013, 09/21/2014   Influenza-Unspecified 11/03/2014, 09/07/2017, 07/18/2020   PFIZER Comirnaty(Gray Top)Covid-19 Tri-Sucrose Vaccine 01/31/2021   PFIZER(Purple Top)SARS-COV-2 Vaccination 12/02/2019, 12/25/2019, 07/18/2020   Pneumococcal Conjugate-13 02/04/2015   Pneumococcal Polysaccharide-23 08/06/2016   Td 02/04/2015   Zoster Recombinat (Shingrix) 06/21/2019   Zoster, Live 04/04/2015    TDAP status: Up to date  Flu Vaccine status: Due, Education has been provided regarding the importance of this vaccine. Advised may receive this vaccine at local pharmacy or Health Dept. Aware to provide a copy of the vaccination record if obtained from local pharmacy or Health Dept. Verbalized acceptance and understanding.  Pneumococcal vaccine status: Up to date  Covid-19 vaccine status: Completed vaccines  Qualifies for Shingles Vaccine? Yes   Zostavax completed Yes   Shingrix Completed?: Yes  Screening Tests Health Maintenance  Topic Date Due   Zoster Vaccines- Shingrix (2 of 2) 08/16/2019   INFLUENZA VACCINE  05/18/2021   TETANUS/TDAP  02/03/2025   COVID-19 Vaccine  Completed   Hepatitis C Screening  Completed   PNA vac Low Risk Adult  Completed   HPV VACCINES  Aged Out    Health Maintenance  Health Maintenance Due  Topic Date Due   Zoster Vaccines- Shingrix (2 of 2) 08/16/2019   INFLUENZA VACCINE  05/18/2021    Colorectal cancer screening: No longer required.   Additional Screening:  Hepatitis C Screening:  Completed 09/02/15  Vision Screening: Recommended annual ophthalmology exams for early detection of glaucoma and other disorders of the eye. Is the patient up to date with their annual eye exam?  Yes  Who is the provider or what is the name of the office in which the patient attends annual eye  exams? Dr Alois Cliche If pt is not established with a provider, would they like to be referred to a provider to establish care? No .   Dental Screening: Recommended annual dental exams for proper oral hygiene  Community Resource Referral / Chronic Care Management: CRR required this visit?  No   CCM required this visit?  No      Plan:     I have personally reviewed and noted the following in the patient's chart:   Medical and social history Use of alcohol, tobacco or illicit drugs  Current medications and supplements including opioid prescriptions. Patient is not currently taking opioid prescriptions. Functional ability and status Nutritional status Physical activity Advanced directives List of other physicians Hospitalizations, surgeries, and ER visits in previous 12 months Vitals Screenings to include cognitive, depression, and falls Referrals and appointments  In addition, I have reviewed and discussed with patient certain preventive protocols, quality metrics, and best practice recommendations. A written personalized care plan for preventive services as well as general preventive health recommendations were provided to patient.     Willette Brace, LPN   [id number]   Nurse Notes: None

## 2021-06-10 ENCOUNTER — Other Ambulatory Visit: Payer: Self-pay

## 2021-06-10 ENCOUNTER — Telehealth (HOSPITAL_COMMUNITY): Payer: Medicare Other | Admitting: Psychiatry

## 2021-06-10 DIAGNOSIS — F325 Major depressive disorder, single episode, in full remission: Secondary | ICD-10-CM | POA: Diagnosis not present

## 2021-06-10 NOTE — Progress Notes (Signed)
Psychiatric Initial Adult Assessment   Patient Identification: Jeffrey Hudson MRN:  [id number] Date of Evaluation:  06/10/2021 Referral Source: Dr. Zettie Pho Chief Complaint:   Visit Diagnosis: Bipolar disorder  History of Present Illness:    Today the patient is not doing all that well.  He denies daily depression.  He is having a significant conflict within his family.  In the last month he went to the beach with a large group of family members and stayed in that house.  He had 10 people there.  While he was at the beach house his wife was not well she got tested and was positive for COVID.  He was tested and was positive as well.  The patient's son and his wife were there to when they did not want them to leave the house.  The patient did wear a mask but he chose to stay at the house.  The patient's nephew who the patient has a good relationship with got very very upset.  His nephew has an autoimmune disease.  He was very frightened and scared and very upset of their choice not to leave.  Some of the other family members apparently felt the same way.  This is because of the division in the family.  The patient's wife agreed with her decision but apparently there are other issues related to the patient's relationship with his wife.  Today the patient did not want to go into that.  He was seen by the phone.  The patient's son and his wife are on the side of the patient.  The patient actually denies being depressed.  He is upset.  Today he shared with me that his alcohol intake is more than just mild.  He is essentially 4 drinks throughout the day.  He says he has been drinking at that degree for years.  It is not increased or decreased.  He uses no other drugs.  Generally he is eating and sleeping well.  He is very emotionally upset about this for conflict amongst his family.  Once a chance to process it and talk about it.  It is clearly a psychosocial stressor but has a risk of throwing him into a  state of depression.  The patient has done very well off his antipsychotic medicine is doing well just taking Prozac.  At this time we will continue the Prozac which now he is getting from his primary care doctor.  We will make an appointment for this patient in the next few weeks to be seen in person ideally for a 45-minute visit.    (Hypo) Manic Symptoms:   Anxiety Symptoms:   Psychotic Symptoms:   PTSD Symptoms:   Past Psychiatric History: Zyprexa and Prozac, has been psychotherapy in the past including cognitive behavioral therapy.  Previous Psychotropic Medications: Zyprexa and Prozac  Substance Abuse History in the last 12 months:    Consequences of Substance Abuse:   Past Medical History:  Past Medical History:  Diagnosis Date   AMI (acute myocardial infarction) ([place])    Acute ST elevation   Anxiety    Arthritis    neck   Bipolar disorder ([place])    COPD (chronic obstructive pulmonary disease) ([place])    Coronary artery disease    a. s/p anterolateral STEMI with BMS placed in large diagonal branch (2009).   Emphysema of lung (HCC)    GERD (gastroesophageal reflux disease)    Glaucoma    Hx of adenomatous polyp of colon 05/06/2021  diminutive - no f/u needed   Hyperlipidemia    Hypertension    Nodule of left lung    6-mm smooothly rounded nodule   Plantar fasciitis    left foot   Pre-diabetes    Syncope and collapse    in 2001 and 2004 with no clear cause   Tobacco abuse    Viral URI with cough 11/22/2016    Past Surgical History:  Procedure Laterality Date   COLONOSCOPY     PERCUTANEOUS CORONARY STENT INTERVENTION (PCI-S)     UPPER GASTROINTESTINAL ENDOSCOPY      Family Psychiatric History:   Family History:  Family History  Problem Relation Age of Onset   Dementia Mother 55       died   Alcohol abuse Father 36   Depression Brother    Colon cancer Neg Hx    Esophageal cancer Neg Hx    Rectal cancer Neg Hx    Stomach cancer Neg Hx     Social  History:   Social History   Socioeconomic History   Marital status: Married    Spouse name: Not on file   Number of children: 1   Years of education: Not on file   Highest education level: Not on file  Occupational History   Occupation: Retired-Sales/broker  Tobacco Use   Smoking status: Every Day    Packs/day: 0.25    Years: 45.00    Pack years: 11.25    Types: Cigarettes   Smokeless tobacco: Never  Vaping Use   Vaping Use: Never used  Substance and Sexual Activity   Alcohol use: Yes    Alcohol/week: 1.0 standard drink    Types: 1 Standard drinks or equivalent per week    Comment: 2 glasses of wine daily   Drug use: No    Comment: marijuana quit 2010. He has hx of THC use.   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 - plays once a week    2 glasses of wine a day max, still smoking 2 cups of coffee a day no drug use or other tobacco   Social Determinants of Radio broadcast assistant Strain: Low Risk    Difficulty of Paying Living Expenses: Not hard at all  Food Insecurity: No Food Insecurity   Worried About Charity fundraiser in the Last Year: Never true   Arboriculturist in the Last Year: Never true  Transportation Needs: No Transportation Needs   Lack of Transportation (Medical): No   Lack of Transportation (Non-Medical): No  Physical Activity: Inactive   Days of Exercise per Week: 0 days   Minutes of Exercise per Session: 0 min  Stress: No Stress Concern Present   Feeling of Stress : Not at all  Social Connections: Socially Isolated   Frequency of Communication with Friends and Family: Once a week   Frequency of Social Gatherings with Friends and Family: Once a week   Attends Religious Services: Never   Marine scientist or Organizations:  No   Attends Archivist Meetings: Never   Marital Status: Married    Additional Social History:   Allergies:   Allergies  Allergen Reactions  Lisinopril Swelling    Angio edema    Norvasc [Amlodipine] Swelling    Lower extremity     Metabolic Disorder Labs: Lab Results  Component Value Date   HGBA1C 5.4 04/30/2021   MPG 131 06/24/2009   No results found for: PROLACTIN Lab Results  Component Value Date   CHOL 174 01/15/2021   TRIG 160.0 (H) 01/15/2021   HDL 64.70 01/15/2021   CHOLHDL 3 01/15/2021   VLDL 32.0 01/15/2021   LDLCALC 77 01/15/2021   LDLCALC 71 08/05/2020   Lab Results  Component Value Date   TSH 3.40 01/15/2021    Therapeutic Level Labs: No results found for: LITHIUM No results found for: CBMZ No results found for: VALPROATE  Current Medications: Current Outpatient Medications  Medication Sig Dispense Refill   albuterol (PROAIR HFA) 108 (90 Base) MCG/ACT inhaler Inhale 2 puffs into the lungs every 6 (six) hours as needed. 1 Inhaler 3   aspirin 81 MG tablet Take 81 mg by mouth daily.     atorvastatin (LIPITOR) 80 MG tablet TAKE 1 TABLET BY MOUTH  DAILY AT 6 PM 90 tablet 3   betamethasone dipropionate 0.05 % cream as needed.      ezetimibe (ZETIA) 10 MG tablet Take 1 tablet (10 mg total) by mouth daily. 90 tablet 3   FLUoxetine (PROZAC) 20 MG capsule Take 1 capsule (20 mg total) by mouth daily. 90 capsule 2   Glycopyrrolate-Formoterol (BEVESPI AEROSPHERE) 9-4.8 MCG/ACT AERO Inhale 2 puffs into the lungs 2 (two) times daily. 10.7 g 5   hydrochlorothiazide (MICROZIDE) 12.5 MG capsule TAKE 1 CAPSULE BY MOUTH  TWICE DAILY 180 capsule 0   latanoprost (XALATAN) 0.005 % ophthalmic solution SMARTSIG:1 Drop(s) In Eye(s) Every Evening     meloxicam (MOBIC) 7.5 MG tablet Take 7.5 mg by mouth as needed for pain.     metoprolol succinate (TOPROL-XL) 25 MG 24 hr tablet TAKE 1 TABLET BY MOUTH  DAILY. 90 tablet 3   Multiple Vitamin (MULTIVITAMIN) tablet  Take 1 tablet by mouth daily.     nitroGLYCERIN (NITROSTAT) 0.4 MG SL tablet Place 0.4 mg under the tongue every 5 (five) minutes as needed for chest pain.     pantoprazole (PROTONIX) 40 MG tablet Take 1 tablet (40 mg total) by mouth daily before breakfast. 90 tablet 3   PFIZER-BIONT COVID-19 VAC-TRIS SUSP injection      timolol (BETIMOL) 0.25 % ophthalmic solution Place 1-2 drops into both eyes daily.     travoprost, benzalkonium, (TRAVATAN) 0.004 % ophthalmic solution Place 1 drop into both eyes at bedtime.     No current facility-administered medications for this visit.    Musculoskeletal: Strength & Muscle Tone: within normal limits Gait & Station: normal Patient leans: N/A  Psychiatric Specialty Exam: ROS  There were no vitals taken for this visit.There is no height or weight on file to calculate BMI.  General Appearance: Casual  Eye Contact:  Good  Speech:  Clear and Coherent  Volume:  Normal  Mood:  Negative  Affect:  Appropriate  Thought Process:  Goal Directed  Orientation:  Full (Time, Place, and Person)  Thought Content:  WDL  Suicidal Thoughts:  No  Homicidal Thoughts:  No  Memory:  NA  Judgement:  Good  Insight:  NA and Good  Psychomotor Activity:  Normal  Concentration:    Recall:  Good  Fund of Knowledge:Good  Language: Good  Akathisia:  No  Handed:  Right  AIMS (if indicated):  not done  Assets:  Desire for Improvement  ADL's:  Intact  Cognition: WNL  Sleep:  Good   Screenings: PHQ2-9    Flowsheet Row Clinical Support from 06/02/2021 in [place] at [facility] from 03/11/2021 in [place] at [place] from 02/04/2015 in [place] at [place]  PHQ-2 Total Score 0 2 0  PHQ-9 Total Score -- 2 --       Assessment and Plan:   8/24/20221:35 PM   Major clinical depression in remission.  The patient will continue taking Prozac and he will return to see me in approximately 3 to 4 weeks for a  supportive psychotherapy session.  He will continue taking Prozac as prescribed.  Patient is not suicidal.  We will talk also about his alcohol intake.

## 2021-06-25 ENCOUNTER — Other Ambulatory Visit: Payer: Self-pay | Admitting: Physician Assistant

## 2021-07-14 ENCOUNTER — Other Ambulatory Visit: Payer: Self-pay

## 2021-07-17 ENCOUNTER — Telehealth: Payer: Self-pay

## 2021-07-17 NOTE — Telephone Encounter (Signed)
Patient called requesting Rx refill  meloxicam (MOBIC) 7.5 MG tablet

## 2021-07-21 MED ORDER — MELOXICAM 7.5 MG PO TABS: 7.5000 mg | ORAL_TABLET | ORAL | 1 refills | Status: DC | PRN

## 2021-07-21 NOTE — Telephone Encounter (Signed)
Will Pt need an appt.

## 2021-07-21 NOTE — Addendum Note (Signed)
Addended by: Apolinar Junes on: 07/21/2021 11:51 AM   Modules accepted: Orders

## 2021-07-22 ENCOUNTER — Encounter: Payer: Self-pay | Admitting: Adult Health

## 2021-07-22 NOTE — Telephone Encounter (Signed)
Patient notified of update  and verbalized understanding. 

## 2021-07-22 NOTE — Telephone Encounter (Signed)
Please advise 

## 2021-07-24 ENCOUNTER — Other Ambulatory Visit: Payer: Self-pay

## 2021-07-24 ENCOUNTER — Telehealth (HOSPITAL_BASED_OUTPATIENT_CLINIC_OR_DEPARTMENT_OTHER): Payer: Medicare Other | Admitting: Psychiatry

## 2021-07-24 DIAGNOSIS — F324 Major depressive disorder, single episode, in partial remission: Secondary | ICD-10-CM

## 2021-07-24 MED ORDER — FLUOXETINE HCL 20 MG PO CAPS: 20.0000 mg | ORAL_CAPSULE | Freq: Every day | ORAL | 2 refills | Status: DC

## 2021-07-24 NOTE — Progress Notes (Signed)
Psychiatric Initial Adult Assessment   Patient Identification: Jeffrey Hudson MRN:  [id number] Date of Evaluation:  07/24/2021 Referral Source: Dr. Zettie Pho Chief Complaint:   Visit Diagnosis: Bipolar disorder  History of Present Illness:    Today the patient is doing fair.  Unfortunately is having a lot of GI distress and was unable to come into the office today.  His stomach is really bothering him a lot for the last 48 hours.  He has an appointment with his primary care doctor in a few days and get with his GI doctor.  Today the patient says that his stomach was not bothering him he would be fine.  He denies being persistently depressed.  He is sleeping only fairly well.  He is obviously having problem with his appetite as he is having significant abdominal discomfort.  He denies any psychotic symptomatology.  He says that he is reduced his alcohol to less than 2 or 3 drinks a day.  I strongly recommend that he stop the alcohol completely until he feels better.  Nonetheless the patient is getting medical care for what seems to be a worsening GI condition.  His GI doctor thinks that he swallows air.  At this time the patient will continue taking his Prozac as prescribed.  The patient is interested in coming in to see me in person and we will do that in 1 to 2 months.    (Hypo) Manic Symptoms:   Anxiety Symptoms:   Psychotic Symptoms:   PTSD Symptoms:   Past Psychiatric History: Zyprexa and Prozac, has been psychotherapy in the past including cognitive behavioral therapy.  Previous Psychotropic Medications: Zyprexa and Prozac  Substance Abuse History in the last 12 months:    Consequences of Substance Abuse:   Past Medical History:  Past Medical History:  Diagnosis Date   AMI (acute myocardial infarction) ([place])    Acute ST elevation   Anxiety    Arthritis    neck   Bipolar disorder ([place])    COPD (chronic obstructive pulmonary disease) ([place])    Coronary artery disease     a. s/p anterolateral STEMI with BMS placed in large diagonal branch (2009).   Emphysema of lung ([place])    GERD (gastroesophageal reflux disease)    Glaucoma    Hx of adenomatous polyp of colon 05/06/2021   diminutive - no f/u needed   Hyperlipidemia    Hypertension    Nodule of left lung    6-mm smooothly rounded nodule   Plantar fasciitis    left foot   Pre-diabetes    Syncope and collapse    in 2001 and 2004 with no clear cause   Tobacco abuse    Viral URI with cough 11/22/2016    Past Surgical History:  Procedure Laterality Date   COLONOSCOPY     PERCUTANEOUS CORONARY STENT INTERVENTION (PCI-S)     UPPER GASTROINTESTINAL ENDOSCOPY      Family Psychiatric History:   Family History:  Family History  Problem Relation Age of Onset   Dementia Mother 66       died   Alcohol abuse Father 4   Depression Brother    Colon cancer Neg Hx    Esophageal cancer Neg Hx    Rectal cancer Neg Hx    Stomach cancer Neg Hx     Social History:   Social History   Socioeconomic History   Marital status: Married    Spouse name: Not on file   Number of  children: 1   Years of education: Not on file   Highest education level: Not on file  Occupational History   Occupation: Retired-Sales/broker  Tobacco Use   Smoking status: Every Day    Packs/day: 0.25    Years: 45.00    Pack years: 11.25    Types: Cigarettes   Smokeless tobacco: Never  Vaping Use   Vaping Use: Never used  Substance and Sexual Activity   Alcohol use: Yes    Alcohol/week: 1.0 standard drink    Types: 1 Standard drinks or equivalent per week    Comment: 2 glasses of wine daily   Drug use: No    Comment: marijuana quit 2010. He has hx of THC use.   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 -  plays once a week    2 glasses of wine a day max, still smoking 2 cups of coffee a day no drug use or other tobacco   Social Determinants of Radio broadcast assistant Strain: Low Risk    Difficulty of Paying Living Expenses: Not hard at all  Food Insecurity: No Food Insecurity   Worried About Charity fundraiser in the Last Year: Never true   Arboriculturist in the Last Year: Never true  Transportation Needs: No Transportation Needs   Lack of Transportation (Medical): No   Lack of Transportation (Non-Medical): No  Physical Activity: Inactive   Days of Exercise per Week: 0 days   Minutes of Exercise per Session: 0 min  Stress: No Stress Concern Present   Feeling of Stress : Not at all  Social Connections: Socially Isolated   Frequency of Communication with Friends and Family: Once a week   Frequency of Social Gatherings with Friends and Family: Once a week   Attends Religious Services: Never   Marine scientist or Organizations: No   Attends Archivist Meetings: Never   Marital Status: Married    Additional Social History:   Allergies:   Allergies  Allergen Reactions   Lisinopril Swelling    Angio edema    Norvasc [Amlodipine] Swelling    Lower extremity     Metabolic Disorder Labs: Lab Results  Component Value Date   HGBA1C 5.4 04/30/2021   MPG 131 06/24/2009   No results found for: PROLACTIN Lab Results  Component Value Date   CHOL 174 01/15/2021   TRIG 160.0 (H) 01/15/2021   HDL 64.70 01/15/2021   CHOLHDL 3 01/15/2021   VLDL 32.0 01/15/2021   LDLCALC 77 01/15/2021   LDLCALC 71 08/05/2020   Lab Results  Component Value Date   TSH 3.40 01/15/2021    Therapeutic Level Labs: No results found for: LITHIUM No results found for: CBMZ No results found for: VALPROATE  Current Medications: Current Outpatient Medications  Medication Sig Dispense Refill   albuterol (PROAIR HFA) 108 (90 Base) MCG/ACT inhaler Inhale 2 puffs into the lungs every  6 (six) hours as needed. 1 Inhaler 3   aspirin 81 MG tablet Take 81 mg by mouth daily.     atorvastatin (LIPITOR) 80 MG tablet TAKE 1 TABLET BY MOUTH  DAILY AT 6PM. Pt has to keep follow up in  November for 1 year supply. 90 tablet 0   betamethasone dipropionate 0.05 % cream as needed.      ezetimibe (ZETIA) 10 MG tablet Take 1 tablet (10 mg total) by mouth daily. 90 tablet 3   FLUoxetine (PROZAC) 20 MG capsule Take 1 capsule (20 mg total) by mouth daily. 90 capsule 2   Glycopyrrolate-Formoterol (BEVESPI AEROSPHERE) 9-4.8 MCG/ACT AERO Inhale 2 puffs into the lungs 2 (two) times daily. 10.7 g 5   hydrochlorothiazide (MICROZIDE) 12.5 MG capsule TAKE 1 CAPSULE BY MOUTH  TWICE DAILY 180 capsule 0   latanoprost (XALATAN) 0.005 % ophthalmic solution SMARTSIG:1 Drop(s) In Eye(s) Every Evening     meloxicam (MOBIC) 7.5 MG tablet Take 1 tablet (7.5 mg total) by mouth as needed for pain. 90 tablet 1   metoprolol succinate (TOPROL-XL) 25 MG 24 hr tablet TAKE 1 TABLET BY MOUTH  DAILY. 90 tablet 3   Multiple Vitamin (MULTIVITAMIN) tablet Take 1 tablet by mouth daily.     nitroGLYCERIN (NITROSTAT) 0.4 MG SL tablet Place 0.4 mg under the tongue every 5 (five) minutes as needed for chest pain.     pantoprazole (PROTONIX) 40 MG tablet Take 1 tablet (40 mg total) by mouth daily before breakfast. 90 tablet 3   PFIZER-BIONT COVID-19 VAC-TRIS SUSP injection      timolol (BETIMOL) 0.25 % ophthalmic solution Place 1-2 drops into both eyes daily.     travoprost, benzalkonium, (TRAVATAN) 0.004 % ophthalmic solution Place 1 drop into both eyes at bedtime.     No current facility-administered medications for this visit.    Musculoskeletal: Strength & Muscle Tone: within normal limits Gait & Station: normal Patient leans: N/A  Psychiatric Specialty Exam: ROS  There were no vitals taken for this visit.There is no height or weight on file to calculate BMI.  General Appearance: Casual  Eye Contact:  Good  Speech:   Clear and Coherent  Volume:  Normal  Mood:  Negative  Affect:  Appropriate  Thought Process:  Goal Directed  Orientation:  Full (Time, Place, and Person)  Thought Content:  WDL  Suicidal Thoughts:  No  Homicidal Thoughts:  No  Memory:  NA  Judgement:  Good  Insight:  NA and Good  Psychomotor Activity:  Normal  Concentration:    Recall:  Good  Fund of Knowledge:Good  Language: Good  Akathisia:  No  Handed:  Right  AIMS (if indicated):  not done  Assets:  Desire for Improvement  ADL's:  Intact  Cognition: WNL  Sleep:  Good   Screenings: PHQ2-9    Flowsheet Row Clinical Support from 06/02/2021 in [place] at [facility] from 03/11/2021 in [place] at [facility] from 02/04/2015 in [place] at [facility] Total Score 0 2 0  PHQ-9 Total Score -- 2 --       Assessment and Plan:   10/7/202210:10 AM    Today the patient is having a significant amount of GI distress.  Her meeting was focused.  He is not feeling depressed.  He is not anhedonic.  He is going to be getting medical attention in the next 1 to 2 weeks for his GI complaints.  The patient will return to see me in 2 months and will be seen in person at that time.  For now we will continue his Prozac as prescribed.

## 2021-07-27 ENCOUNTER — Other Ambulatory Visit: Payer: Self-pay

## 2021-07-28 ENCOUNTER — Ambulatory Visit (INDEPENDENT_AMBULATORY_CARE_PROVIDER_SITE_OTHER): Payer: Medicare Other | Admitting: Adult Health

## 2021-07-28 ENCOUNTER — Encounter: Payer: Self-pay | Admitting: Adult Health

## 2021-07-28 VITALS — BP 120/70 | HR 64 | Temp 98.9°F | Ht 65.0 in | Wt 151.0 lb

## 2021-07-28 DIAGNOSIS — K625 Hemorrhage of anus and rectum: Secondary | ICD-10-CM

## 2021-07-28 DIAGNOSIS — M159 Polyosteoarthritis, unspecified: Secondary | ICD-10-CM | POA: Diagnosis not present

## 2021-07-28 DIAGNOSIS — Z23 Encounter for immunization: Secondary | ICD-10-CM | POA: Diagnosis not present

## 2021-07-28 DIAGNOSIS — R1030 Lower abdominal pain, unspecified: Secondary | ICD-10-CM

## 2021-07-28 DIAGNOSIS — R7303 Prediabetes: Secondary | ICD-10-CM | POA: Diagnosis not present

## 2021-07-28 DIAGNOSIS — R142 Eructation: Secondary | ICD-10-CM

## 2021-07-28 DIAGNOSIS — Z720 Tobacco use: Secondary | ICD-10-CM

## 2021-07-28 LAB — HEMOGLOBIN A1C: Hgb A1c MFr Bld: 5.9 % (ref 4.6–6.5)

## 2021-07-28 MED ORDER — MELOXICAM 7.5 MG PO TABS: 7.5000 mg | ORAL_TABLET | Freq: Every day | ORAL | 1 refills | Status: DC

## 2021-07-28 NOTE — Patient Instructions (Addendum)
[facility]  Address: [address], [place], [place] [address] Phone: [phone]  I will refer you over to Rheumatology - they will all you to schedule   Stop by the lab on your way out and get your A1c checked

## 2021-07-28 NOTE — Progress Notes (Signed)
Subjective:    Patient ID: Jeffrey Hudson, male    DOB: Oct 31, 1950, 70 y.o.   MRN: [id number]  HPI 70 year old male who  has a past medical history of AMI (acute myocardial infarction) ([place]), Anxiety, Arthritis, Bipolar disorder ([place]), COPD (chronic obstructive pulmonary disease) ([place]), Coronary artery disease, Emphysema of lung ([place]), GERD (gastroesophageal reflux disease), Glaucoma, adenomatous polyp of colon (05/06/2021), Hyperlipidemia, Hypertension, Nodule of left lung, Plantar fasciitis, Pre-diabetes, Syncope and collapse, Tobacco abuse, and Viral URI with cough (11/22/2016).  He presents to the office today to address multiple issues.   He has a history of glucose intolerance/prediabetes, his A1c 6 months ago was 6.4, he made lifestyle modifications and follow-up A1c 2 months ago was 5.4.  He would like to recheck today.  Excessive belching-he has been evaluated by myself as well as GI.  Back in spring 2022 he was having in GERD symptoms and tried over-the-counter acids without relief.  I then placed him on Prilosec for 14 days.  Halfway through the Prilosec treatment his burning symptoms resolved but did develop excessive belching.  Also had some flatulence.  He then tried some Gas-X with no relief.  We had him finish his Prilosec course for a total of 30 days, but continued to have excessive belching that is disrupting his sleep.  There was no dysphagia or unintentional weight loss.  He did have some mild abdominal discomfort.  He had some slight loss of appetite, no early satiety or change in bowel habits.  He was then seen by gastroenterology and had an endoscopy and colonoscopy done.  Colonoscopy showed two sessile polyps in the proximal sigmoid colon and see some, multiple diverticula were found in the sigmoid colon, external hemorrhoids were found.    endoscopy showed LA grade B esophagitis with no bleeding found in the distal esophagus.  Diffuse inflammation characterized by  erythema, friability, and granularity was found in the gastric body in the gastric antrum.  Does have a history of COPD continues to smoke ( has cut down from a pack and a half to three cigarettes per day) GI did get a chest x-ray which did not show any acute cardiopulmonary issues.  It was thought that maybe he was becoming air aphagic air trapper  Today he reports that he continues to have excessive belching and flatulence.  His GERD has resolved.  Does have a follow-up appointment with his gastroenterologist in a couple weeks.  Additionally he reports that late last week he was having severe lower abdominal pain, did not have a bowel movement x4 days when he did eventually have a bowel movement it was diarrhea and he noticed dark red blood in his stool.  He had dark red blood throughout the weekend, was noted when he wiped.  Yesterday he started to have improvement in his lower abdominal pain, had a more normal bowel movement and no blood.  Today he continues on the path to recovery.  Furthermore, continues to have Arthritic  pain in his neck and both hands.  He has gone through physical therapy which she finds helpful and takes Mobic 7.5 mg as needed which helps to some degree.  Pain is worse in the morning and then gets slightly better throughout the afternoon.  He is wondering if there is anything else that can be done, would like a referral to see a specilaist   Review of Systems See HPI   Past Medical History:  Diagnosis Date   AMI (acute  myocardial infarction) ([place])    Acute ST elevation   Anxiety    Arthritis    neck   Bipolar disorder ([facility])    COPD (chronic obstructive pulmonary disease) ([facility])    Coronary artery disease    a. s/p anterolateral STEMI with BMS placed in large diagonal branch (2009).   Emphysema of lung ([facility])    GERD (gastroesophageal reflux disease)    Glaucoma    Hx of adenomatous polyp of colon 05/06/2021   diminutive - no f/u needed   Hyperlipidemia     Hypertension    Nodule of left lung    6-mm smooothly rounded nodule   Plantar fasciitis    left foot   Pre-diabetes    Syncope and collapse    in 2001 and 2004 with no clear cause   Tobacco abuse    Viral URI with cough 11/22/2016    Social History   Socioeconomic History   Marital status: Married    Spouse name: Not on file   Number of children: 1   Years of education: Not on file   Highest education level: Not on file  Occupational History   Occupation: Retired-Sales/broker  Tobacco Use   Smoking status: Every Day    Packs/day: 0.25    Years: 45.00    Pack years: 11.25    Types: Cigarettes   Smokeless tobacco: Never  Vaping Use   Vaping Use: Never used  Substance and Sexual Activity   Alcohol use: Yes    Alcohol/week: 1.0 standard drink    Types: 1 Standard drinks or equivalent per week    Comment: 2 glasses of wine daily   Drug use: No    Comment: marijuana quit 2010. He has hx of THC use.   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 - plays once a week    2 glasses of wine a day max, still smoking 2 cups of coffee a day no drug use or other tobacco   Social Determinants of Radio broadcast assistant Strain: Low Risk    Difficulty of Paying Living Expenses: Not hard at all  Food Insecurity: No Food Insecurity   Worried About Charity fundraiser in the Last Year: Never true   Arboriculturist in the Last Year: Never true  Transportation Needs: No Transportation Needs   Lack of Transportation (Medical): No   Lack of Transportation (Non-Medical): No  Physical Activity: Inactive   Days of Exercise per Week: 0 days   Minutes of Exercise per Session: 0 min  Stress: No Stress Concern Present   Feeling of Stress : Not at all  Social Connections:  Socially Isolated   Frequency of Communication with Friends and Family: Once a week   Frequency of Social Gatherings with Friends and Family: Once a week   Attends Religious Services: Never   Marine scientist or Organizations: No   Attends Music therapist: Never   Marital Status: Married  Human resources officer Violence: Not At Risk   Fear of Current or Ex-Partner: No   Emotionally Abused: No   Physically Abused: No   Sexually Abused: No    Past Surgical History:  Procedure Laterality Date   COLONOSCOPY     PERCUTANEOUS CORONARY STENT INTERVENTION (PCI-S)     UPPER GASTROINTESTINAL ENDOSCOPY      Family History  Problem Relation Age of Onset   Dementia Mother 42       died   Alcohol abuse Father 10   Depression Brother    Colon cancer Neg Hx    Esophageal cancer Neg Hx    Rectal cancer Neg Hx    Stomach cancer Neg Hx     Allergies  Allergen Reactions   Lisinopril Swelling    Angio edema    Norvasc [Amlodipine] Swelling    Lower extremity     Current Outpatient Medications on File Prior to Visit  Medication Sig Dispense Refill   albuterol (PROAIR HFA) 108 (90 Base) MCG/ACT inhaler Inhale 2 puffs into the lungs every 6 (six) hours as needed. 1 Inhaler 3   aspirin 81 MG tablet Take 81 mg by mouth daily.     atorvastatin (LIPITOR) 80 MG tablet TAKE 1 TABLET BY MOUTH  DAILY AT 6PM. Pt has to keep follow up in November for 1 year supply. 90 tablet 0   betamethasone dipropionate 0.05 % cream as needed.      ezetimibe (ZETIA) 10 MG tablet Take 1 tablet (10 mg total) by mouth daily. 90 tablet 3   FLUoxetine (PROZAC) 20 MG capsule Take 1 capsule (20 mg total) by mouth daily. 90 capsule 2   Glycopyrrolate-Formoterol (BEVESPI AEROSPHERE) 9-4.8 MCG/ACT AERO Inhale 2 puffs into the lungs 2 (two) times daily. 10.7 g 5   hydrochlorothiazide (MICROZIDE) 12.5 MG capsule TAKE 1 CAPSULE BY MOUTH  TWICE DAILY 180 capsule 0   latanoprost (XALATAN) 0.005 % ophthalmic  solution SMARTSIG:1 Drop(s) In Eye(s) Every Evening     meloxicam (MOBIC) 7.5 MG tablet Take 1 tablet (7.5 mg total) by mouth as needed for pain. 90 tablet 1   metoprolol succinate (TOPROL-XL) 25 MG 24 hr tablet TAKE 1 TABLET BY MOUTH  DAILY. 90 tablet 3   Multiple Vitamin (MULTIVITAMIN) tablet Take 1 tablet by mouth daily.     nitroGLYCERIN (NITROSTAT) 0.4 MG SL tablet Place 0.4 mg under the tongue every 5 (five) minutes as needed for chest pain.     pantoprazole (PROTONIX) 40 MG tablet Take 1 tablet (40 mg total) by mouth daily before breakfast. 90 tablet 3   PFIZER-BIONT COVID-19 VAC-TRIS SUSP injection      timolol (BETIMOL) 0.25 % ophthalmic solution Place 1-2 drops into both eyes daily.     travoprost, benzalkonium, (TRAVATAN) 0.004 % ophthalmic solution Place 1 drop into both eyes at bedtime.     No current facility-administered medications on file prior to visit.    There were no vitals taken for this visit.      Objective:   Physical Exam Vitals and nursing note reviewed.  Constitutional:      General: He is not in acute distress.    Appearance: Normal appearance. He is well-developed and normal weight.  HENT:     Head: Normocephalic and atraumatic.     Right Ear: Tympanic membrane, ear canal and external ear normal. There is no impacted cerumen.     Left Ear: Tympanic membrane, ear canal and external ear normal. There is no impacted cerumen.     Nose: Nose normal. No congestion or rhinorrhea.     Mouth/Throat:     Mouth: Mucous membranes are moist.     Pharynx: Oropharynx is clear. No oropharyngeal  exudate or posterior oropharyngeal erythema.  Eyes:     General:        Right eye: No discharge.        Left eye: No discharge.     Extraocular Movements: Extraocular movements intact.     Conjunctiva/sclera: Conjunctivae normal.     Pupils: Pupils are equal, round, and reactive to light.  Neck:     Vascular: No carotid bruit.     Trachea: No tracheal deviation.   Cardiovascular:     Rate and Rhythm: Normal rate and regular rhythm.     Pulses: Normal pulses.     Heart sounds: Normal heart sounds. No murmur heard.   No friction rub. No gallop.  Pulmonary:     Effort: Pulmonary effort is normal. No respiratory distress.     Breath sounds: Normal breath sounds. No stridor. No wheezing, rhonchi or rales.  Chest:     Chest wall: No tenderness.  Abdominal:     General: Bowel sounds are normal. There is no distension.     Palpations: Abdomen is soft. There is no mass.     Tenderness: There is abdominal tenderness in the periumbilical area, suprapubic area and left lower quadrant. There is no right CVA tenderness, left CVA tenderness, guarding or rebound.     Hernia: No hernia is present.  Genitourinary:    Rectum: Guaiac result negative. Internal hemorrhoid present. No tenderness.  Musculoskeletal:        General: No swelling, tenderness, deformity or signs of injury. Normal range of motion.     Right lower leg: No edema.     Left lower leg: No edema.  Lymphadenopathy:     Cervical: No cervical adenopathy.  Skin:    General: Skin is warm and dry.     Capillary Refill: Capillary refill takes less than 2 seconds.     Coloration: Skin is not jaundiced or pale.     Findings: No bruising, erythema, lesion or rash.  Neurological:     General: No focal deficit present.     Mental Status: He is alert and oriented to person, place, and time.     Cranial Nerves: No cranial nerve deficit.     Sensory: No sensory deficit.     Motor: No weakness.     Coordination: Coordination normal.     Gait: Gait normal.     Deep Tendon Reflexes: Reflexes normal.  Psychiatric:        Mood and Affect: Mood normal.        Behavior: Behavior normal.        Thought Content: Thought content normal.        Judgment: Judgment normal.       Assessment & Plan:  1. Prediabetes - Consider Metformin  - Hemoglobin A1c; Future - Hemoglobin A1c  2. Need for immunization  against influenza  - Flu Vaccine QUAD 91mo+IM (Fluarix, Fluzone & Alfiuria Quad PF)  3. Bur[place] -We will have him follow-up with his pulmonologist.  Probably the best thing he can do right now is to continue to work on quitting smoking.  4. Tobacco use -Continue to cut back he has made quite good progress at this point  5. Lower abdominal pain -Still tender but improving.  Hospital mild diverticulitis.  Looking at newer recommendations recommendation is not to treat with antibiotics.  Return precautions reviewed  6. Rectal bleeding -Hemoccult negative in the office today  7. Primary osteoarthritis involving multiple joints -Not sure what else can  be done to help with his arthritic pain but will refer to rheumatology for further evaluation - Ambulatory referral to Rheumatology  Time spent on chart review, time with patient; discussion of prediabetes, osteoarthritis, COPD, tobacco use, diverticulitis, and rectal bleeding home monitoring and  treatment, follow up plan, and documentation 43 minutes   Dorothyann Peng, NP

## 2021-07-29 ENCOUNTER — Telehealth: Payer: Self-pay | Admitting: Adult Health

## 2021-07-29 ENCOUNTER — Other Ambulatory Visit: Payer: Self-pay | Admitting: Physician Assistant

## 2021-07-29 NOTE — Telephone Encounter (Signed)
TP please advise.  Would this be treated by PULM or GI?  Thanks

## 2021-07-30 NOTE — Telephone Encounter (Signed)
Never followed up from 2020  PFT were ordered but not done   Will need ov to discuss issues ,   Please contact office for sooner follow up if symptoms do not improve or worsen or seek emergency care

## 2021-07-30 NOTE — Telephone Encounter (Signed)
Called and spoke to pt. Informed him of the recs per TP. Appt scheduled with Dr. Elsworth Soho for 09/04/21. Pt verbalized understanding and denied any further questions or concerns at this time.

## 2021-08-17 ENCOUNTER — Other Ambulatory Visit: Payer: Self-pay | Admitting: *Deleted

## 2021-08-17 DIAGNOSIS — Z87891 Personal history of nicotine dependence: Secondary | ICD-10-CM

## 2021-08-17 DIAGNOSIS — F17210 Nicotine dependence, cigarettes, uncomplicated: Secondary | ICD-10-CM

## 2021-08-18 ENCOUNTER — Other Ambulatory Visit: Payer: Self-pay | Admitting: Cardiovascular Disease

## 2021-08-19 ENCOUNTER — Encounter: Payer: Self-pay | Admitting: Internal Medicine

## 2021-08-19 ENCOUNTER — Ambulatory Visit: Payer: Medicare Other | Admitting: Internal Medicine

## 2021-08-19 VITALS — BP 132/64 | HR 60 | Ht 65.0 in | Wt 152.4 lb

## 2021-08-19 DIAGNOSIS — K2100 Gastro-esophageal reflux disease with esophagitis, without bleeding: Secondary | ICD-10-CM | POA: Diagnosis not present

## 2021-08-19 DIAGNOSIS — R142 Eructation: Secondary | ICD-10-CM | POA: Diagnosis not present

## 2021-08-19 NOTE — Patient Instructions (Signed)
Take your pantoprazole and don't stop it.  We are providing you with handout's to read and follow today on diaphragmatic breathing .   If you are age 72 or older, your body mass index should be between 23-30. Your Body mass index is 25.36 kg/m. If this is out of the aforementioned range listed, please consider follow up with your Primary Care Provider.  If you are age 44 or younger, your body mass index should be between 19-25. Your Body mass index is 25.36 kg/m. If this is out of the aformentioned range listed, please consider follow up with your Primary Care Provider.   ________________________________________________________  The [place] providers would like to encourage you to use [facility] to communicate with providers for non-urgent requests or questions.  Due to long hold times on the telephone, sending your provider a message by [facility] may be a faster and more efficient way to get a response.  Please allow 48 business hours for a response.  Please remember that this is for non-urgent requests.  _______________________________________________________  I appreciate the opportunity to care for you. Silvano Rusk, MD, [facility]

## 2021-08-19 NOTE — Progress Notes (Signed)
Jeffrey Hudson 70 y.o. 1951-03-14 [id number]  Assessment & Plan:   Encounter Diagnoses  Name Primary?   Gastroesophageal reflux disease with esophagitis without hemorrhage Yes   Belching      Stay on PPI to treat GERD. Diaphragmatc breathing techniques are suggested, he was given information on how to do this with a handout and a video to watch on YouTube, through the [employer] [place].  This may help his belching and could also help his COPD issues.  Return to me as needed.  I tried to reassure him as best I could as well already belching is not nor is it related to a serious health condition.  CC: Dorothyann Peng, NP  Subjective:   Chief Complaint:  HPI Patient reports ports that his heartburn is under control at this point on pantoprazole, EGD in July with results as below.  Still has a lot of belching and wonders about air swallowing etc.  I had told him some people with COPD and problems like he has 10 to be air swallowing as in my experience.  This is still bothering him quite a bit and he wonders what he might do about it.  He also had an episode of abdominal distention and left lower quadrant pain and saw primary care and they elected for observation did entertain the possibility of diverticulitis but he improved on his own.  Antibiotics were not prescribed and imaging was not ordered.  That note was in October on the 11th and I have reviewed that.  He is continuing to try to quit smoking but is concerned that he may belch more.  He has read that that might happen.  Also has a fair amount of flatus still.  He also had some transient rectal bleeding and was evaluated with a Hemoccult in primary care and was negative.   - Two diminutive polyps in the proximal sigmoid colon and in the cecum, removed with a cold snare. Resected and retrieved. - Diverticulosis in the sigmoid colon. - External hemorrhoids. - The examination was otherwise normal on direct and  retroflexion views.  - LA Grade B reflux esophagitis with no bleeding. Biopsied. - Gastritis. Biopsied. - The examination was otherwise normal.   1. Surgical [P], distal esophagus, GE junction - ESOPHAGEAL SQUAMOUS MUCOSA WITH REFLUX-ASSOCIATED CHANGES - NEGATIVE FOR INTESTINAL METAPLASIA OR DYSPLASIA 2. Surgical [P], gastric antrum and gastric body - GASTRIC ANTRAL MUCOSA WITH MILD NONSPECIFIC REACTIVE GASTROPATHY - GASTRIC OXYNTIC MUCOSA WITH NO SPECIFIC HISTOPATHOLOGIC CHANGES - WARTHIN STARRY STAIN IS NEGATIVE FOR HELICOBACTER PYLORI 3. Surgical [P], colon, cecum, sigmoid, polyp (2) - TUBULAR ADENOMA WITHOUT HIGH-GRADE DYSPLASIA OR MALIGNANCY - INFLAMMATORY POLYP(S)  Allergies  Allergen Reactions   Lisinopril Swelling    Angio edema    Metformin And Related Diarrhea   Norvasc [Amlodipine] Swelling    Lower extremity    Current Meds  Medication Sig   albuterol (PROAIR HFA) 108 (90 Base) MCG/ACT inhaler Inhale 2 puffs into the lungs every 6 (six) hours as needed.   aspirin 81 MG tablet Take 81 mg by mouth daily.   atorvastatin (LIPITOR) 80 MG tablet TAKE 1 TABLET BY MOUTH  DAILY AT 6PM. Pt has to keep follow up in November for 1 year supply.   betamethasone dipropionate 0.05 % cream as needed.    ezetimibe (ZETIA) 10 MG tablet Take 1 tablet (10 mg total) by mouth daily. Please keep upcoming appt in November 2022 with Cardiologist before anymore refills. Thank you  FLUoxetine (PROZAC) 20 MG capsule Take 1 capsule (20 mg total) by mouth daily.   Glycopyrrolate-Formoterol (BEVESPI AEROSPHERE) 9-4.8 MCG/ACT AERO Inhale 2 puffs into the lungs 2 (two) times daily.   hydrochlorothiazide (MICROZIDE) 12.5 MG capsule TAKE 1 CAPSULE BY MOUTH  TWICE DAILY   latanoprost (XALATAN) 0.005 % ophthalmic solution SMARTSIG:1 Drop(s) In Eye(s) Every Evening   meloxicam (MOBIC) 7.5 MG tablet Take 1 tablet (7.5 mg total) by mouth daily.   metoprolol succinate (TOPROL-XL) 25 MG 24 hr tablet TAKE 1  TABLET BY MOUTH  DAILY   Multiple Vitamin (MULTIVITAMIN) tablet Take 1 tablet by mouth daily.   nitroGLYCERIN (NITROSTAT) 0.4 MG SL tablet Place 0.4 mg under the tongue every 5 (five) minutes as needed for chest pain.   pantoprazole (PROTONIX) 40 MG tablet Take 1 tablet (40 mg total) by mouth daily before breakfast.   PFIZER-BIONT COVID-19 VAC-TRIS SUSP injection    timolol (BETIMOL) 0.25 % ophthalmic solution Place 1-2 drops into both eyes daily.   travoprost, benzalkonium, (TRAVATAN) 0.004 % ophthalmic solution Place 1 drop into both eyes at bedtime.   Past Medical History:  Diagnosis Date   AMI (acute myocardial infarction) ([place])    Acute ST elevation   Anxiety    Arthritis    neck   Bipolar disorder ([place])    COPD (chronic obstructive pulmonary disease) ([place])    Coronary artery disease    a. s/p anterolateral STEMI with BMS placed in large diagonal branch (2009).   Emphysema of lung ([facility])    GERD (gastroesophageal reflux disease)    Glaucoma    Hx of adenomatous polyp of colon 05/06/2021   diminutive - no f/u needed   Hyperlipidemia    Hypertension    Nodule of left lung    6-mm smooothly rounded nodule   Plantar fasciitis    left foot   Pre-diabetes    Syncope and collapse    in 2001 and 2004 with no clear cause   Tobacco abuse    Viral URI with cough 11/22/2016   Past Surgical History:  Procedure Laterality Date   COLONOSCOPY     PERCUTANEOUS CORONARY STENT INTERVENTION (PCI-S)     UPPER GASTROINTESTINAL ENDOSCOPY     Social History   Social History Narrative   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 - plays once a week    2 glasses of wine a day max, still smoking 2 cups of coffee a day no drug use or other tobacco   family history includes Alcohol abuse (age of onset: 46) in his father; Dementia (age of onset: 35) in his mother;  Depression in his brother.   Review of Systems As per HPI  Objective:   Physical Exam BP 132/64   Pulse 60   Ht 5\' 5"  (1.651 m)   Wt 152 lb 6.4 oz (69.1 kg)   BMI 25.36 kg/m  Elderly white man looking mildly chronically ill Abdomen is soft and nontender there is a small reducible umbilical hernia minimal.

## 2021-08-21 ENCOUNTER — Ambulatory Visit: Payer: Medicare Other | Admitting: Podiatry

## 2021-08-25 DIAGNOSIS — H401131 Primary open-angle glaucoma, bilateral, mild stage: Secondary | ICD-10-CM | POA: Diagnosis not present

## 2021-09-04 ENCOUNTER — Other Ambulatory Visit: Payer: Self-pay

## 2021-09-04 ENCOUNTER — Ambulatory Visit: Payer: Medicare Other | Admitting: Pulmonary Disease

## 2021-09-04 ENCOUNTER — Encounter: Payer: Self-pay | Admitting: Pulmonary Disease

## 2021-09-04 DIAGNOSIS — Z9189 Other specified personal risk factors, not elsewhere classified: Secondary | ICD-10-CM

## 2021-09-04 DIAGNOSIS — J432 Centrilobular emphysema: Secondary | ICD-10-CM

## 2021-09-04 DIAGNOSIS — R911 Solitary pulmonary nodule: Secondary | ICD-10-CM

## 2021-09-04 DIAGNOSIS — F17200 Nicotine dependence, unspecified, uncomplicated: Secondary | ICD-10-CM | POA: Diagnosis not present

## 2021-09-04 NOTE — Progress Notes (Signed)
   Subjective:    Patient ID: Jeffrey Hudson, male    DOB: 12-01-1950, 70 y.o.   MRN: [id number]  HPI 70 yo smoker for FU of COPD/ emphysema -more than 73 Pyrs  PMH - bipolar, mild OSA s/p UPPP , CAD  He is following up after 2 years.  He is vaccinated and boosted.  Had COVID infection in August but did not get very sick. Has decreased cigarette smoking to 5 cigarettes a day, only when he has his scotch in the evenings. He is actually stopped using Bevespi and has not needed albuterol inhaler much No interim flareups or coughing or wheezing.  He had GI work-up including negative colonoscopy, complains of excessive bloating and wonders if this could be related to emphysema   Significant tests/ events reviewed  PFTs 03/2012 - FEV1 of 2.81- 99% FVC of 212% and ratio  of 63. Smaller airways were decreased at 41%. Lung volumes were preserved with DLCO 14.2-71%.    Spirometry 2015 - FEV1 of 2.59 -81% and FVC of 3.34-82% with ratio 78.  (ht 66)  12/2018 Spirometry- ratio of 71, FEV1 of  2.20-61% and FVC of 63% consistent with moderate airway obstruction (-some of this variation is related to difference in height entered)   CT chest in 2004 showed 6 mm nodule in the left lower lobe.but was also noted in 1995.  LDCT 07/2020 >> stable nodules, largest LUL 58mm, mod emphysema, atherosclerosis   Review of Systems neg for any significant sore throat, dysphagia, itching, sneezing, nasal congestion or excess/ purulent secretions, fever, chills, sweats, unintended wt loss, pleuritic or exertional cp, hempoptysis, orthopnea pnd or change in chronic leg swelling. Also denies presyncope, palpitations, heartburn, abdominal pain, nausea, vomiting, diarrhea or change in bowel or urinary habits, dysuria,hematuria, rash, arthralgias, visual complaints, headache, numbness weakness or ataxia.     Objective:   Physical Exam  Gen. Pleasant, well-nourished, in no distress ENT - no thrush, no  pallor/icterus,no post nasal drip Neck: No JVD, no thyromegaly, no carotid bruits Lungs: no use of accessory muscles, no dullness to percussion, clear without rales or rhonchi  Cardiovascular: Rhythm regular, heart sounds  normal, no murmurs or gallops, no peripheral edema Musculoskeletal: No deformities, no cyanosis or clubbing        Assessment & Plan:

## 2021-09-04 NOTE — Patient Instructions (Addendum)
Spirometry Use albuterol as needed Await CT scan Congratulations on cutting down

## 2021-09-04 NOTE — Assessment & Plan Note (Signed)
Smoking cessation again emphasized is the most important intervention I offered him Nicotrol inhaler to help him quit but he prefers to try and do this himself

## 2021-09-04 NOTE — Assessment & Plan Note (Signed)
Very mild emphysema Repeat spirometry and if FEV1 continues to drop then we will ask him to be more consistent with [facility]

## 2021-09-04 NOTE — Addendum Note (Signed)
Addended by: Darliss Ridgel on: 09/04/2021 10:50 AM   Modules accepted: Orders

## 2021-09-04 NOTE — Assessment & Plan Note (Signed)
We reviewed low-dose CT from 2021 which showed stable nodules including dominant left upper lobe nodule Other findings of atherosclerosis and liver steatosis were reviewed, advised to cut down alcohol. Follow-up CT scan this year has been scheduled for December

## 2021-09-08 ENCOUNTER — Ambulatory Visit: Payer: Medicare Other | Admitting: Physician Assistant

## 2021-09-14 ENCOUNTER — Ambulatory Visit: Payer: Medicare Other | Admitting: Podiatry

## 2021-09-17 ENCOUNTER — Ambulatory Visit (INDEPENDENT_AMBULATORY_CARE_PROVIDER_SITE_OTHER)
Admission: RE | Admit: 2021-09-17 | Discharge: 2021-09-17 | Disposition: A | Discharge status: Home / Self Care | Payer: Medicare Other | Source: Ambulatory Visit | Attending: Cardiology | Admitting: Cardiology

## 2021-09-17 ENCOUNTER — Other Ambulatory Visit: Payer: Self-pay

## 2021-09-17 DIAGNOSIS — F17210 Nicotine dependence, cigarettes, uncomplicated: Secondary | ICD-10-CM | POA: Diagnosis not present

## 2021-09-17 DIAGNOSIS — Z87891 Personal history of nicotine dependence: Secondary | ICD-10-CM

## 2021-09-18 NOTE — Progress Notes (Addendum)
Office Visit Note  Patient: Jeffrey Hudson             Date of Birth: [birth year]           MRN: [id number]             PCP: Dorothyann Peng, NP Referring: Dorothyann Peng, NP Visit Date: 10/01/2021 Occupation: @GUAROCC @  Subjective:  Pain in multiple joints   History of Present Illness: Jeffrey Hudson is a 70 y.o. male seen in consultation per request of his PCP.  According the patient his symptoms a started about 2 years ago with left foot and ankle pain.  There was no injury.  He was seen by foot and ankle specialist and had a cortisone injection which resolved his symptoms.  He still has intermittent left foot discomfort.  He had another episode of left foot Planter fasciitis for which he had an injection last year which resolution of her symptoms.  He had no recurrence of the plantar fasciitis.  He states in January 2022 he woke up 1 morning and found that his neck was very stiff.  He related it to new pillows which he bought.  He was seen by his PCP as his symptoms persist.  He had x-rays and was told that he had degenerative disc disease.  He also had some lab work which was unremarkable.  He was sent to physical therapy this summer.  He states he had 8 weeks of physical therapy without much improvement.  He also had dry needling which did not help much.  He has a stiffness every morning.  He states his stretching his neck helps.  He has been experiencing some discomfort in his right shoulder blade off and on.  He has been experiencing pain and stiffness in his bilateral hands since January 2022.  He has not noticed any joint swelling.  He has noticed decreased grip strength and difficulty holding objects and with the fine motor movements.  He used to play golf.  He states that he is unable to play golf because of his foot neck and hand pain.  He denies any joint swelling.  There is no history of psoriasis.  There is no family history of arthritis or autoimmune disease.  Activities of  Daily Living:  Patient reports morning stiffness for all day. Patient Reports nocturnal pain.  Difficulty dressing/grooming: Reports Difficulty climbing stairs: Reports Difficulty getting out of chair: Reports Difficulty using hands for taps, buttons, cutlery, and/or writing: Reports  Review of Systems  Constitutional:  Negative for fatigue and night sweats.  HENT:  Positive for nose dryness. Negative for mouth sores and mouth dryness.   Eyes:  Negative for pain, redness, itching and dryness.  Respiratory:  Negative for shortness of breath and difficulty breathing.   Cardiovascular:  Negative for chest pain, palpitations, hypertension, irregular heartbeat and swelling in legs/feet.  Gastrointestinal:  Negative for blood in stool, constipation and diarrhea.  Endocrine: Negative for increased urination.  Genitourinary:  Negative for difficulty urinating.  Musculoskeletal:  Positive for joint pain, joint pain and morning stiffness. Negative for joint swelling, myalgias, muscle weakness, muscle tenderness and myalgias.  Skin:  Negative for color change, rash, hair loss, nodules/bumps, redness, skin tightness, ulcers and sensitivity to sunlight.  Allergic/Immunologic: Negative for susceptible to infections.  Neurological:  Positive for numbness. Negative for dizziness, fainting, headaches, memory loss, night sweats and weakness.  Hematological:  Positive for bruising/bleeding tendency. Negative for swollen glands.  Psychiatric/Behavioral:  Negative for depressed  mood, confusion and sleep disturbance. The patient is not nervous/anxious.    PMFS History:  Patient Active Problem List   Diagnosis Date Noted   Plantar fasciitis of left foot 09/03/2020   Essential hypertension 05/06/2016   Multiple pulmonary nodules 01/01/2015   OSA (obstructive sleep apnea) 08/03/2013   COPD (chronic obstructive pulmonary disease) ([place]) 10/23/2012   NEVUS, ATYPICAL 07/31/2010   Eczema 07/31/2010   Pulmonary  nodule less than 1 cm in diameter with low risk for malignant neoplasm 07/28/2010   AMI 11/20/2009   POSTSURG PERCUT TRANSLUMINAL COR ANGPLSTY STS 08/07/2009   Hyperlipidemia 07/07/2009   Bipolar disorder ([place]) 07/07/2009   CAD, NATIVE VESSEL 07/07/2009   ALCOHOL ABUSE 07/04/2009   TOBACCO ABUSE 07/04/2009   SYNCOPE 07/04/2009    Past Medical History:  Diagnosis Date   AMI (acute myocardial infarction) ([place])    Acute ST elevation   Anxiety    Arthritis    neck   Bipolar disorder ([facility])    COPD (chronic obstructive pulmonary disease) ([place])    Coronary artery disease    a. s/p anterolateral STEMI with BMS placed in large diagonal branch (2009).   Emphysema of lung ([place])    GERD (gastroesophageal reflux disease)    Glaucoma    Hx of adenomatous polyp of colon 05/06/2021   diminutive - no f/u needed   Hyperlipidemia    Hypertension    Nodule of left lung    6-mm smooothly rounded nodule   Plantar fasciitis    left foot   Pre-diabetes    Syncope and collapse    in 2001 and 2004 with no clear cause   Tobacco abuse    Viral URI with cough 11/22/2016    Family History  Problem Relation Age of Onset   Dementia Mother 37       died   Alcohol abuse Father 20   Depression Brother    Healthy Son    Colon cancer Neg Hx    Esophageal cancer Neg Hx    Rectal cancer Neg Hx    Stomach cancer Neg Hx    Past Surgical History:  Procedure Laterality Date   COLONOSCOPY     PERCUTANEOUS CORONARY STENT INTERVENTION (PCI-S)     UPPER GASTROINTESTINAL ENDOSCOPY     Social History   Social History Narrative   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 - plays once a week    2 glasses of wine a day max, still smoking 2 cups of coffee a day no drug use or other tobacco   Immunization History  Administered Date(s) Administered   Fluad Quad(high Dose 65+)  06/21/2019   Influenza, High Dose Seasonal PF 08/06/2016, 08/23/2017, 06/29/2018, 06/21/2019   Influenza,inj,Quad PF,6+ Mos 08/03/2013, 09/21/2014, 07/28/2021   Influenza-Unspecified 11/03/2014, 09/07/2017, 07/18/2020   PFIZER Comirnaty(Gray Top)Covid-19 Tri-Sucrose Vaccine 01/31/2021   PFIZER(Purple Top)SARS-COV-2 Vaccination 12/02/2019, 12/25/2019, 07/18/2020   Pneumococcal Conjugate-13 02/04/2015   Pneumococcal Polysaccharide-23 08/06/2016   Td 02/04/2015   Zoster Recombinat (Shingrix) 06/21/2019, 10/05/2019   Zoster, Live 04/04/2015     Objective: Vital Signs: BP (!) 176/87 (BP Location: Right Arm, Patient Position: Sitting, Cuff Size: Normal)   Pulse (!) 55   Ht 5' 5.5" (1.664 m)   Wt 157 lb (71.2 kg)   BMI 25.73 kg/m    Physical Exam  Vitals and nursing note reviewed.  Constitutional:      Appearance: He is well-developed.  HENT:     Head: Normocephalic and atraumatic.  Eyes:     Conjunctiva/sclera: Conjunctivae normal.     Pupils: Pupils are equal, round, and reactive to light.  Cardiovascular:     Rate and Rhythm: Normal rate and regular rhythm.     Heart sounds: Normal heart sounds.  Pulmonary:     Effort: Pulmonary effort is normal.     Breath sounds: Normal breath sounds.  Abdominal:     General: Bowel sounds are normal.     Palpations: Abdomen is soft.  Musculoskeletal:     Cervical back: Normal range of motion and neck supple.  Skin:    General: Skin is warm and dry.     Capillary Refill: Capillary refill takes less than 2 seconds.  Neurological:     Mental Status: He is alert and oriented to person, place, and time.  Psychiatric:        Behavior: Behavior normal.     Musculoskeletal Exam: He had a stiffness with range of motion of his cervical spine and limited lateral rotation.  Thoracic and lumbar spine were in good range of motion with no SI joint tenderness.  Shoulder joints and elbow joints with good range of motion.  He had tenderness on palpation  of his left lateral epicondyle region.  He had no synovitis or bilateral wrist joints or MCPs.  He had bilateral CMC thickening and PIP and DIP thickening.  Loss of right second and third digit  pulp was noted due to previous injury.    He had tenderness on palpation of right trochanteric bursa.  Bilateral hip joints were in full range of motion without discomfort.  Knee joints well full range of motion without any warmth swelling or effusion.  There was no tenderness over ankles.  He had bilateral pes planus and thickening of bilateral first MTP PIP and DIP joints.  CDAI Exam: CDAI Score: -- Patient Global: --; Provider Global: -- Swollen: --; Tender: -- Joint Exam 10/01/2021   No joint exam has been documented for this visit   There is currently no information documented on the homunculus. Go to the Rheumatology activity and complete the homunculus joint exam.  Investigation: No additional findings.  Imaging: CT CHEST LUNG CA SCREEN LOW DOSE W/O CM  Result Date: 09/18/2021 CLINICAL DATA:  Lung cancer screening. Fifty-one pack-year history. Current asymptomatic smoker. EXAM: CT CHEST WITHOUT CONTRAST LOW-DOSE FOR LUNG CANCER SCREENING TECHNIQUE: Multidetector CT imaging of the chest was performed following the standard protocol without IV contrast. COMPARISON:  None. FINDINGS: Cardiovascular: Heart size is normal. No pericardial effusion. Aortic atherosclerosis. Coronary artery calcifications. Mediastinum/Nodes: No enlarged mediastinal, hilar, or axillary lymph nodes. Thyroid gland, trachea, and esophagus demonstrate no significant findings. Lungs/Pleura: No pleural effusion, airspace consolidation or atelectasis. Moderate to severe paraseptal and centrilobular emphysema. Multiple pulmonary nodules are again noted scattered throughout both lungs. The largest nodule is in the perihilar aspect of the central left upper lobe with a mean derived diameter of 7.5 mm. These are not significantly changed  when compared with the previous exam. No new suspicious lung nodules. Upper Abdomen: No acute abnormality. Aortic atherosclerosis. Bilateral upper pole kidney cysts are identified. Musculoskeletal: Moderate to severe paraseptal and centrilobular emphysema. No pleural effusion, airspace consolidation or pneumothorax. IMPRESSION: 1. Lung-RADS 2, benign appearance or behavior. Continue annual screening with low-dose chest CT without contrast in 12 months. 2. Coronary  artery calcifications. 3. Aortic Atherosclerosis (ICD10-I70.0) and Emphysema (ICD10-J43.9). Electronically Signed   By: Kerby Moors M.D.   On: 09/18/2021 11:50   Recent Labs: Lab Results  Component Value Date   WBC 9.3 01/15/2021   HGB 13.9 01/15/2021   PLT 223.0 01/15/2021   NA 138 01/15/2021   K 4.0 01/15/2021   CL 99 01/15/2021   CO2 26 01/15/2021   GLUCOSE 129 (H) 01/15/2021   BUN 25 (H) 01/15/2021   CREATININE 0.89 01/15/2021   BILITOT 1.3 (H) 01/15/2021   ALKPHOS 102 01/15/2021   AST 23 01/15/2021   ALT 20 01/15/2021   PROT 6.5 01/15/2021   ALBUMIN 4.2 01/15/2021   CALCIUM 9.9 01/15/2021   GFRAA 96 08/05/2020    Speciality Comments: No specialty comments available.  Procedures:  No procedures performed Allergies: Lisinopril, Metformin and related, and Norvasc [amlodipine]   Assessment / Plan:     Visit Diagnoses: Primary osteoarthritis involving multiple joints - 04/30/21: ANA negative, ESR 5, CRP<1, RF<14  DDD (degenerative disc disease), cervical-he complains of pain and stiffness in his cervical spine for the last 1 year.  X-rays of the cervical spine were reviewed with the patient.  He had facet joint arthropathy.  He has tried physical therapy without much relief.  Some of the neck exercises were demonstrated in the office and a handout was given.  Need for regular exercise was emphasized.  I also gave him a prescription for TENS unit which may be helpful.  Lateral epicondylitis, left elbow-he had tenderness  over lateral epicondyle region.  Use of topical Salonpas was advised.  A handout on forearm exercises was given.  Primary osteoarthritis of both hands-he had bilateral DIP and PIP thickening.  He had bilateral CMC discomfort.  X-rays of bilateral hands were reviewed which showed severe CMC narrowing and subluxation.  PIP and DIP narrowing was noted.  No synovitis was noted.  Clinical and radiographic findings were consistent with osteoarthritis.  Detailed counsel regarding osteoarthritis was provided.  A handout on hand exercises was given.  Joint protection muscle strengthening was discussed.  Natural anti-inflammatories were discussed.  Trochanteric bursitis, right hip-he had tenderness over right trochanteric bursa.  IT band stretches were discussed and a handout was given.  Primary osteoarthritis of both feet-he complains of pain and stiffness in his bilateral feet especially the left foot.  X-rays of bilateral feet were reviewed and findings were discussed.  He has severe pes planus more prominent in the left foot.  Feet muscle strengthening exercises were discussed.  Pes planus of both feet-I advised him to schedule an appointment with the podiatrist for arch support and orthotics.  Plantar fasciitis of left foot-patient had an episode of Planter fasciitis last year which resolved after cortisone injection.  Other medical problems are listed as follows:  Other eczema  Essential hypertension  Centrilobular emphysema ([place]) - followed by pulmonologist.  Multiple pulmonary nodules  Smoker - 5 cig/day x 25 years  Pure hypercholesterolemia  Bipolar affective disorder in remission ([place])  Alcohol abuse - 2 glasses of a scotch every evening  OSA (obstructive sleep apnea)  Orders: No orders of the defined types were placed in this encounter.  No orders of the defined types were placed in this encounter.    Follow-Up Instructions: Return in about 6 months (around 04/01/2022), or if  symptoms worsen or fail to improve, for Osteoarthritis.   Bo Merino, MD  Note - This record has been created using Editor, commissioning.  Chart creation  errors have been sought, but may not always  have been located. Such creation errors do not reflect on  the standard of medical care.,

## 2021-09-23 ENCOUNTER — Other Ambulatory Visit: Payer: Self-pay

## 2021-09-23 ENCOUNTER — Ambulatory Visit (HOSPITAL_COMMUNITY): Payer: Medicare Other | Admitting: Psychiatry

## 2021-09-23 ENCOUNTER — Encounter (HOSPITAL_COMMUNITY): Payer: Self-pay | Admitting: Psychiatry

## 2021-09-23 VITALS — BP 132/74 | HR 63 | Temp 98.6°F | Resp 18 | Ht 66.0 in | Wt 158.2 lb

## 2021-09-23 DIAGNOSIS — F325 Major depressive disorder, single episode, in full remission: Secondary | ICD-10-CM

## 2021-09-23 MED ORDER — FLUOXETINE HCL 20 MG PO CAPS: 20.0000 mg | ORAL_CAPSULE | Freq: Every day | ORAL | 2 refills | Status: DC

## 2021-09-23 NOTE — Progress Notes (Signed)
Psychiatric Initial Adult Assessment   Patient Identification: Jeffrey Hudson MRN:  [id number] Date of Evaluation:  09/23/2021 Referral Source: Dr. Zettie Pho Chief Complaint:   Visit Diagnosis: Bipolar disorder  History of Present Illness:    Today the patient is doing well.  It turns out his GI distress was related to diverticular disease.  It is now resolved.  The patient is seen today in a supportive psychotherapy session to discuss what happened on her holiday trip.  The patient and his wife came to a family beach trip and unfortunately the patient's wife had COVID.  The patient's nephew was very upset that she was there and wanted them all to leave.  The patient refused unfortunately the patient was angry and verbally acted out.  Since that he has had a family that is what he is apologize to everyone including the nephew who he has a problem with.  Apparently his daughter-in-law is still not happy with the way this patient acted at this family event.  Apparently he was very verbally aggressive.  The patient feels very guilty about this but he knows that he cannot do anything else.  He denies being depressed about.  He is sleeping and eating well.  His alcohol use is usually about 2 drinks.  Physically at this time is doing well.  His mood is good.  His energy level is good.  He will continue taking his Prozac as prescribed prescribed and he will return to see me in 3 months.    (Hypo) Manic Symptoms:   Anxiety Symptoms:   Psychotic Symptoms:   PTSD Symptoms:   Past Psychiatric History: Zyprexa and Prozac, has been psychotherapy in the past including cognitive behavioral therapy.  Previous Psychotropic Medications: Zyprexa and Prozac  Substance Abuse History in the last 12 months:    Consequences of Substance Abuse:   Past Medical History:  Past Medical History:  Diagnosis Date   AMI (acute myocardial infarction) ([place])    Acute ST elevation   Anxiety    Arthritis    neck    Bipolar disorder ([place])    COPD (chronic obstructive pulmonary disease) ([place])    Coronary artery disease    a. s/p anterolateral STEMI with BMS placed in large diagonal branch (2009).   Emphysema of lung ([place])    GERD (gastroesophageal reflux disease)    Glaucoma    Hx of adenomatous polyp of colon 05/06/2021   diminutive - no f/u needed   Hyperlipidemia    Hypertension    Nodule of left lung    6-mm smooothly rounded nodule   Plantar fasciitis    left foot   Pre-diabetes    Syncope and collapse    in 2001 and 2004 with no clear cause   Tobacco abuse    Viral URI with cough 11/22/2016    Past Surgical History:  Procedure Laterality Date   COLONOSCOPY     PERCUTANEOUS CORONARY STENT INTERVENTION (PCI-S)     UPPER GASTROINTESTINAL ENDOSCOPY      Family Psychiatric History:   Family History:  Family History  Problem Relation Age of Onset   Dementia Mother 41       died   Alcohol abuse Father 38   Depression Brother    Colon cancer Neg Hx    Esophageal cancer Neg Hx    Rectal cancer Neg Hx    Stomach cancer Neg Hx     Social History:   Social History   Socioeconomic History  Marital status: Married    Spouse name: Not on file   Number of children: 1   Years of education: Not on file   Highest education level: Not on file  Occupational History   Occupation: Retired-Sales/broker  Tobacco Use   Smoking status: Every Day    Packs/day: 0.25    Years: 45.00    Pack years: 11.25    Types: Cigarettes   Smokeless tobacco: Never   Tobacco comments:    Patient states he's down to smoking 5 cigarettes a day/night  Vaping Use   Vaping Use: Never used  Substance and Sexual Activity   Alcohol use: Yes    Alcohol/week: 1.0 standard drink    Types: 1 Standard drinks or equivalent per week    Comment: 2 glasses of wine daily   Drug use: No    Comment: marijuana quit 2010. He has hx of THC use.   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 - plays once a week    2 glasses of wine a day max, still smoking 2 cups of coffee a day no drug use or other tobacco   Social Determinants of Radio broadcast assistant Strain: Low Risk    Difficulty of Paying Living Expenses: Not hard at all  Food Insecurity: No Food Insecurity   Worried About Charity fundraiser in the Last Year: Never true   Arboriculturist in the Last Year: Never true  Transportation Needs: No Transportation Needs   Lack of Transportation (Medical): No   Lack of Transportation (Non-Medical): No  Physical Activity: Inactive   Days of Exercise per Week: 0 days   Minutes of Exercise per Session: 0 min  Stress: No Stress Concern Present   Feeling of Stress : Not at all  Social Connections: Socially Isolated   Frequency of Communication with Friends and Family: Once a week   Frequency of Social Gatherings with Friends and Family: Once a week   Attends Religious Services: Never   Marine scientist or Organizations: No   Attends Archivist Meetings: Never   Marital Status: Married    Additional Social History:   Allergies:   Allergies  Allergen Reactions   Lisinopril Swelling    Angio edema    Metformin And Related Diarrhea   Norvasc [Amlodipine] Swelling    Lower extremity     Metabolic Disorder Labs: Lab Results  Component Value Date   HGBA1C 5.9 07/28/2021   MPG 131 06/24/2009   No results found for: PROLACTIN Lab Results  Component Value Date   CHOL 174 01/15/2021   TRIG 160.0 (H) 01/15/2021   HDL 64.70 01/15/2021   CHOLHDL 3 01/15/2021   VLDL 32.0 01/15/2021   LDLCALC 77 01/15/2021   LDLCALC 71 08/05/2020   Lab Results  Component Value Date   TSH 3.40 01/15/2021    Therapeutic Level Labs: No results found for: LITHIUM No  results found for: CBMZ No results found for: VALPROATE  Current Medications: Current Outpatient Medications  Medication Sig Dispense Refill   albuterol (PROAIR HFA) 108 (90 Base) MCG/ACT inhaler Inhale 2 puffs into the lungs every 6 (six) hours as needed. (Patient not  taking: Reported on 09/04/2021) 1 Inhaler 3   aspirin 81 MG tablet Take 81 mg by mouth daily.     atorvastatin (LIPITOR) 80 MG tablet TAKE 1 TABLET BY MOUTH  DAILY AT 6PM. Pt has to keep follow up in November for 1 year supply. 90 tablet 0   betamethasone dipropionate 0.05 % cream as needed.      ezetimibe (ZETIA) 10 MG tablet Take 1 tablet (10 mg total) by mouth daily. Please keep upcoming appt in November 2022 with Cardiologist before anymore refills. Thank you 90 tablet 0   FLUoxetine (PROZAC) 20 MG capsule Take 1 capsule (20 mg total) by mouth daily. 90 capsule 2   Glycopyrrolate-Formoterol (BEVESPI AEROSPHERE) 9-4.8 MCG/ACT AERO Inhale 2 puffs into the lungs 2 (two) times daily. (Patient not taking: Reported on 09/04/2021) 10.7 g 5   hydrochlorothiazide (MICROZIDE) 12.5 MG capsule TAKE 1 CAPSULE BY MOUTH  TWICE DAILY 180 capsule 1   latanoprost (XALATAN) 0.005 % ophthalmic solution SMARTSIG:1 Drop(s) In Eye(s) Every Evening     meloxicam (MOBIC) 7.5 MG tablet Take 1 tablet (7.5 mg total) by mouth daily. 90 tablet 1   metoprolol succinate (TOPROL-XL) 25 MG 24 hr tablet TAKE 1 TABLET BY MOUTH  DAILY 90 tablet 0   Multiple Vitamin (MULTIVITAMIN) tablet Take 1 tablet by mouth daily.     nitroGLYCERIN (NITROSTAT) 0.4 MG SL tablet Place 0.4 mg under the tongue every 5 (five) minutes as needed for chest pain.     pantoprazole (PROTONIX) 40 MG tablet Take 1 tablet (40 mg total) by mouth daily before breakfast. 90 tablet 3   PFIZER-BIONT COVID-19 VAC-TRIS SUSP injection      timolol (BETIMOL) 0.25 % ophthalmic solution Place 1-2 drops into both eyes daily.     travoprost, benzalkonium, (TRAVATAN) 0.004 % ophthalmic solution Place 1  drop into both eyes at bedtime.     No current facility-administered medications for this visit.    Musculoskeletal: Strength & Muscle Tone: within normal limits Gait & Station: normal Patient leans: N/A  Psychiatric Specialty Exam: ROS  Blood pressure 132/74, pulse 63, temperature 98.6 F (37 C), temperature source Oral, resp. rate 18, height 5\' 6"  (1.676 m), weight 158 lb 3.2 oz (71.8 kg), SpO2 95 %.Body mass index is 25.53 kg/m.  General Appearance: Casual  Eye Contact:  Good  Speech:  Clear and Coherent  Volume:  Normal  Mood:  Negative  Affect:  Appropriate  Thought Process:  Goal Directed  Orientation:  Full (Time, Place, and Person)  Thought Content:  WDL  Suicidal Thoughts:  No  Homicidal Thoughts:  No  Memory:  NA  Judgement:  Good  Insight:  NA and Good  Psychomotor Activity:  Normal  Concentration:    Recall:  Good  Fund of Knowledge:Good  Language: Good  Akathisia:  No  Handed:  Right  AIMS (if indicated):  not done  Assets:  Desire for Improvement  ADL's:  Intact  Cognition: WNL  Sleep:  Good   Screenings: PHQ2-9    Flowsheet Row Clinical Support from 06/02/2021 in [facility] at [place] from 03/11/2021 in [place] at [place] from 02/04/2015 in [place] at [facility] Total Score 0 2 0  PHQ-9 Total Score -- 2 --       Assessment and Plan:   12/7/20222:19 PM   Today the patient is doing well.  He is diagnosed with major depression and takes Prozac on a regular basis.  He has stress related to family conflict which now seems to be more resolved.  He needed some time to talk about it and cathartic.  He also is interested in psychotherapy and today we will give him a list of names that he can contact.

## 2021-09-25 ENCOUNTER — Other Ambulatory Visit: Payer: Self-pay | Admitting: Acute Care

## 2021-09-25 ENCOUNTER — Encounter: Payer: Self-pay | Admitting: Podiatry

## 2021-09-25 ENCOUNTER — Other Ambulatory Visit: Payer: Self-pay

## 2021-09-25 ENCOUNTER — Ambulatory Visit: Payer: Medicare Other | Admitting: Podiatry

## 2021-09-25 DIAGNOSIS — B351 Tinea unguium: Secondary | ICD-10-CM | POA: Diagnosis not present

## 2021-09-25 DIAGNOSIS — Z87891 Personal history of nicotine dependence: Secondary | ICD-10-CM

## 2021-09-25 DIAGNOSIS — M79676 Pain in unspecified toe(s): Secondary | ICD-10-CM

## 2021-09-25 DIAGNOSIS — F17210 Nicotine dependence, cigarettes, uncomplicated: Secondary | ICD-10-CM

## 2021-09-25 NOTE — Progress Notes (Signed)
This patient returns to the office for evaluation and treatment of long thick painful nails .  This patient is unable to trim his own nails since the patient cannot reach the feet.  Patient says the nails are painful walking and wearing his shoes. He returns for preventive foot care services.  General Appearance  Alert, conversant and in no acute stress.  Vascular  Dorsalis pedis and posterior tibial  pulses are palpable  bilaterally.  Capillary return is within normal limits  bilaterally. Temperature is within normal limits  bilaterally.  Neurologic  Senn-Weinstein monofilament wire test within normal limits  bilaterally. Muscle power within normal limits bilaterally.  Nails Thick disfigured discolored nails with subungual debris  from hallux to fifth toes bilaterally. No evidence of bacterial infection or drainage bilaterally.  Orthopedic  No limitations of motion  feet .  No crepitus or effusions noted.  No bony pathology or digital deformities noted.  Skin  normotropic skin with no porokeratosis noted bilaterally.  No signs of infections or ulcers noted.     Onychomycosis  Pain in toes right foot  Pain in toes left foot  Debridement  of nails  1-5  B/L with a nail nipper.  Nails were then filed using a dremel tool with no incidents.   RTC  4  months Iatrogenic lesion 4th toe right foot.   Gardiner Barefoot DPM

## 2021-10-01 ENCOUNTER — Encounter: Payer: Self-pay | Admitting: Rheumatology

## 2021-10-01 ENCOUNTER — Other Ambulatory Visit: Payer: Self-pay

## 2021-10-01 ENCOUNTER — Ambulatory Visit: Payer: Medicare Other | Admitting: Rheumatology

## 2021-10-01 VITALS — BP 176/87 | HR 55 | Ht 65.5 in | Wt 157.0 lb

## 2021-10-01 DIAGNOSIS — M19041 Primary osteoarthritis, right hand: Secondary | ICD-10-CM | POA: Diagnosis not present

## 2021-10-01 DIAGNOSIS — M159 Polyosteoarthritis, unspecified: Secondary | ICD-10-CM

## 2021-10-01 DIAGNOSIS — M7712 Lateral epicondylitis, left elbow: Secondary | ICD-10-CM

## 2021-10-01 DIAGNOSIS — R918 Other nonspecific abnormal finding of lung field: Secondary | ICD-10-CM

## 2021-10-01 DIAGNOSIS — M2142 Flat foot [pes planus] (acquired), left foot: Secondary | ICD-10-CM

## 2021-10-01 DIAGNOSIS — M2141 Flat foot [pes planus] (acquired), right foot: Secondary | ICD-10-CM

## 2021-10-01 DIAGNOSIS — M7061 Trochanteric bursitis, right hip: Secondary | ICD-10-CM

## 2021-10-01 DIAGNOSIS — M722 Plantar fascial fibromatosis: Secondary | ICD-10-CM

## 2021-10-01 DIAGNOSIS — M19072 Primary osteoarthritis, left ankle and foot: Secondary | ICD-10-CM

## 2021-10-01 DIAGNOSIS — I10 Essential (primary) hypertension: Secondary | ICD-10-CM

## 2021-10-01 DIAGNOSIS — M150 Primary generalized (osteo)arthritis: Secondary | ICD-10-CM

## 2021-10-01 DIAGNOSIS — G4733 Obstructive sleep apnea (adult) (pediatric): Secondary | ICD-10-CM

## 2021-10-01 DIAGNOSIS — M19071 Primary osteoarthritis, right ankle and foot: Secondary | ICD-10-CM

## 2021-10-01 DIAGNOSIS — J432 Centrilobular emphysema: Secondary | ICD-10-CM

## 2021-10-01 DIAGNOSIS — M5030 Other cervical disc degeneration, unspecified cervical region: Secondary | ICD-10-CM

## 2021-10-01 DIAGNOSIS — F3170 Bipolar disorder, currently in remission, most recent episode unspecified: Secondary | ICD-10-CM

## 2021-10-01 DIAGNOSIS — L308 Other specified dermatitis: Secondary | ICD-10-CM

## 2021-10-01 DIAGNOSIS — M19042 Primary osteoarthritis, left hand: Secondary | ICD-10-CM

## 2021-10-01 DIAGNOSIS — F17200 Nicotine dependence, unspecified, uncomplicated: Secondary | ICD-10-CM

## 2021-10-01 DIAGNOSIS — F1010 Alcohol abuse, uncomplicated: Secondary | ICD-10-CM

## 2021-10-01 DIAGNOSIS — E7800 Pure hypercholesterolemia, unspecified: Secondary | ICD-10-CM

## 2021-10-01 NOTE — Patient Instructions (Addendum)
Cervical Strain and Sprain Rehab Ask your health care provider which exercises are safe for you. Do exercises exactly as told by your health care provider and adjust them as directed. It is normal to feel mild stretching, pulling, tightness, or discomfort as you do these exercises. Stop right away if you feel sudden pain or your pain gets worse. Do not begin these exercises until told by your health care provider. Stretching and range-of-motion exercises Cervical side bending  Using good posture, sit on a stable chair or stand up. Without moving your shoulders, slowly tilt your left / right ear to your shoulder until you feel a stretch in the opposite side neck muscles. You should be looking straight ahead. Hold for __________ seconds. Repeat with the other side of your neck. Repeat __________ times. Complete this exercise __________ times a day. Cervical rotation  Using good posture, sit on a stable chair or stand up. Slowly turn your head to the side as if you are looking over your left / right shoulder. Keep your eyes level with the ground. Stop when you feel a stretch along the side and the back of your neck. Hold for __________ seconds. Repeat this by turning to your other side. Repeat __________ times. Complete this exercise __________ times a day. Thoracic extension and pectoral stretch Roll a towel or a small blanket so it is about 4 inches (10 cm) in diameter. Lie down on your back on a firm surface. Put the towel lengthwise, under your spine in the middle of your back. It should not be under your shoulder blades. The towel should line up with your spine from your middle back to your lower back. Put your hands behind your head and let your elbows fall out to your sides. Hold for __________ seconds. Repeat __________ times. Complete this exercise __________ times a day. Strengthening exercises Isometric upper cervical flexion Lie on your back with a thin pillow behind your head  and a small rolled-up towel under your neck. Gently tuck your chin toward your chest and nod your head down to look toward your feet. Do not lift your head off the pillow. Hold for __________ seconds. Release the tension slowly. Relax your neck muscles completely before you repeat this exercise. Repeat __________ times. Complete this exercise __________ times a day. Isometric cervical extension  Stand about 6 inches (15 cm) away from a wall, with your back facing the wall. Place a soft object, about 6-8 inches (15-20 cm) in diameter, between the back of your head and the wall. A soft object could be a small pillow, a ball, or a folded towel. Gently tilt your head back and press into the soft object. Keep your jaw and forehead relaxed. Hold for __________ seconds. Release the tension slowly. Relax your neck muscles completely before you repeat this exercise. Repeat __________ times. Complete this exercise __________ times a day. Posture and body mechanics Body mechanics refers to the movements and positions of your body while you do your daily activities. Posture is part of body mechanics. Good posture and healthy body mechanics can help to relieve stress in your body's tissues and joints. Good posture means that your spine is in its natural S-curve position (your spine is neutral), your shoulders are pulled back slightly, and your head is not tipped forward. The following are general guidelines for applying improved posture and body mechanics to your everyday activities. Sitting  When sitting, keep your spine neutral and keep your feet flat on the floor.  Use a footrest, if necessary, and keep your thighs parallel to the floor. Avoid rounding your shoulders, and avoid tilting your head forward. When working at a desk or a computer, keep your desk at a height where your hands are slightly lower than your elbows. Slide your chair under your desk so you are close enough to maintain good posture. When  working at a computer, place your monitor at a height where you are looking straight ahead and you do not have to tilt your head forward or downward to look at the screen. Standing  When standing, keep your spine neutral and keep your feet about hip-width apart. Keep a slight bend in your knees. Your ears, shoulders, and hips should line up. When you do a task in which you stand in one place for a long time, place one foot up on a stable object that is 2-4 inches (5-10 cm) high, such as a footstool. This helps keep your spine neutral. Resting When lying down and resting, avoid positions that are most painful for you. Try to support your neck in a neutral position. You can use a contour pillow or a small rolled-up towel. Your pillow should support your neck but not push on it. This information is not intended to replace advice given to you by your health care provider. Make sure you discuss any questions you have with your health care provider. Hand Exercises Hand exercises can be helpful for almost anyone. These exercises can strengthen the hands, improve flexibility and movement, and increase blood flow to the hands. These results can make work and daily tasks easier. Hand exercises can be especially helpful for people who have joint pain from arthritis or have nerve damage from overuse (carpal tunnel syndrome). These exercises can also help people who have injured a hand. Exercises Most of these hand exercises are gentle stretching and motion exercises. It is usually safe to do them often throughout the day. Warming up your hands before exercise may help to reduce stiffness. You can do this with gentle massage or by placing your hands in warm water for 10-15 minutes. It is normal to feel some stretching, pulling, tightness, or mild discomfort as you begin new exercises. This will gradually improve. Stop an exercise right away if you feel sudden, severe pain or your pain gets worse. Ask your health  care provider which exercises are best for you. Knuckle bend or "claw" fist  Stand or sit with your arm, hand, and all five fingers pointed straight up. Make sure to keep your wrist straight during the exercise. Gently bend your fingers down toward your palm until the tips of your fingers are touching the top of your palm. Keep your big knuckle straight and just bend the small knuckles in your fingers. Hold this position for __________ seconds. Straighten (extend) your fingers back to the starting position. Repeat this exercise 5-10 times with each hand. Full finger fist  Stand or sit with your arm, hand, and all five fingers pointed straight up. Make sure to keep your wrist straight during the exercise. Gently bend your fingers into your palm until the tips of your fingers are touching the middle of your palm. Hold this position for __________ seconds. Extend your fingers back to the starting position, stretching every joint fully. Repeat this exercise 5-10 times with each hand. Straight fist Stand or sit with your arm, hand, and all five fingers pointed straight up. Make sure to keep your wrist straight during the exercise.  Gently bend your fingers at the big knuckle, where your fingers meet your hand, and the middle knuckle. Keep the knuckle at the tips of your fingers straight and try to touch the bottom of your palm. Hold this position for __________ seconds. Extend your fingers back to the starting position, stretching every joint fully. Repeat this exercise 5-10 times with each hand. Tabletop  Stand or sit with your arm, hand, and all five fingers pointed straight up. Make sure to keep your wrist straight during the exercise. Gently bend your fingers at the big knuckle, where your fingers meet your hand, as far down as you can while keeping the small knuckles in your fingers straight. Think of forming a tabletop with your fingers. Hold this position for __________ seconds. Extend  your fingers back to the starting position, stretching every joint fully. Repeat this exercise 5-10 times with each hand. Finger spread  Place your hand flat on a table with your palm facing down. Make sure your wrist stays straight as you do this exercise. Spread your fingers and thumb apart from each other as far as you can until you feel a gentle stretch. Hold this position for __________ seconds. Bring your fingers and thumb tight together again. Hold this position for __________ seconds. Repeat this exercise 5-10 times with each hand. Making circles  Stand or sit with your arm, hand, and all five fingers pointed straight up. Make sure to keep your wrist straight during the exercise. Make a circle by touching the tip of your thumb to the tip of your index finger. Hold for __________ seconds. Then open your hand wide. Repeat this motion with your thumb and each finger on your hand. Repeat this exercise 5-10 times with each hand. Thumb motion  Sit with your forearm resting on a table and your wrist straight. Your thumb should be facing up toward the ceiling. Keep your fingers relaxed as you move your thumb. Lift your thumb up as high as you can toward the ceiling. Hold for __________ seconds. Bend your thumb across your palm as far as you can, reaching the tip of your thumb for the small finger (pinkie) side of your palm. Hold for __________ seconds. Repeat this exercise 5-10 times with each hand. Grip strengthening  Hold a stress ball or other soft ball in the middle of your hand. Slowly increase the pressure, squeezing the ball as much as you can without causing pain. Think of bringing the tips of your fingers into the middle of your palm. All of your finger joints should bend when doing this exercise. Hold your squeeze for __________ seconds, then relax. Repeat this exercise 5-10 times with each hand. Contact a health care provider if: Your hand pain or discomfort gets much worse  when you do an exercise. Your hand pain or discomfort does not improve within 2 hours after you exercise. If you have any of these problems, stop doing these exercises right away. Do not do them again unless your health care provider says that you can. Get help right away if: You develop sudden, severe hand pain or swelling. If this happens, stop doing these exercises right away. Do not do them again unless your health care provider says that you can. This information is not intended to replace advice given to you by your health care provider. Make sure you discuss any questions you have with your health care provider. Document Revised: 01/22/2021 Document Reviewed: 01/22/2021 Elsevier Patient Education  [place]. .  Elbow and Forearm Exercises Ask your health care provider which exercises are safe for you. Do exercises exactly as told by your health care provider and adjust them as directed. It is normal to feel mild stretching, pulling, tightness, or discomfort as you do these exercises. Stop right away if you feel sudden pain or your pain gets worse. Do not begin these exercises until told by your health care provider. Range-of-motion exercises These exercises warm up your muscles and joints and improve the movement and flexibility of your injured elbow and forearm. The exercises also help to relieve pain, numbness, and tingling. These exercises are done using the muscles in your injured elbow and forearm (active). Elbow flexion, active Hold your left / right arm at your side, and bend your elbow (flexion) as far as you can using only your arm muscles. Hold this position for __________ seconds. Slowly return to the starting position. Repeat __________ times. Complete this exercise __________ times a day. Elbow extension, active Hold your left / right arm at your side, and straighten your elbow (extension) as much as you can using only your arm muscles. Hold this position for  __________ seconds. Slowly return to the starting position. Repeat __________ times. Complete this exercise __________ times a day. Active forearm rotation, supination This is an exercise in which you turn (rotate) your forearm palm up (supination). Stand or sit with your elbows at your sides. Bend your left / right elbow to a 90-degree angle (right angle). Rotate your palm up until you feel a gentle stretch on the inside of your forearm. Hold this position for __________ seconds. Slowly return to the starting position. Repeat __________ times. Complete this exercise __________ times a day. Active forearm rotation, pronation This is an exercise in which you turn (rotate) your forearm palm down (pronation). Stand or sit with your elbows at your sides. Bend your left / right elbow to a 90-degree angle (right angle). Rotate your palm down until you feel a gentle stretch on the top of your forearm. Hold this position for __________ seconds. Slowly return to the starting position. Repeat __________ times. Complete this exercise __________ times a day. Stretching exercises These exercises warm up your muscles and joints and improve the movement and flexibility of your injured elbow and forearm. These exercises also help to relieve pain, numbness, and tingling. These exercises are done using your healthy elbow and forearm to help stretch the muscles in your injured elbow and forearm (active-assisted). Elbow flexion, active-assisted  Hold your left / right arm at your side, and bend your elbow (flexion) as much as you can using your left / right arm muscles. Use your other hand to bend your left / right elbow farther. To do this, gently push up on your forearm until you feel a gentle stretch on the back of your elbow. Hold this position for __________ seconds. Slowly return to the starting position. Repeat __________ times. Complete this exercise __________ times a day. Elbow extension,  active-assisted  Hold your left / right arm at your side, and straighten your elbow (extension) as much as you can using your left / right arm muscles. Use your other hand to straighten the left / right elbow farther. To do this, gently push down on your forearm until you feel a gentle stretch on the inside of your elbow. Hold this position for __________ seconds. Slowly return to the starting position. Repeat __________ times. Complete this exercise __________ times a day. Active-assisted forearm rotation, supination This  is an exercise in which you turn (rotate) your forearm palm up (supination). Sit with your left / right elbow bent in a 90-degree angle (right angle) with your forearm resting on a table. Keeping your upper body and shoulder still, rotate your forearm so your palm faces upward. Use your other hand to help rotate your forearm further until you feel a gentle to moderate stretch. Hold this position for __________ seconds. Slowly release the stretch and return to the starting position. Repeat __________ times. Complete this exercise __________ times a day. Active-assisted forearm rotation, pronation This is an exercise in which you turn (rotate) your forearm palm down (pronation). Sit with your left / right elbow bent in a 90-degree angle (right angle) with your forearm resting on a table. Keeping your upper body and shoulder still, rotate your forearm so your palm faces the tabletop. Use your other hand to help rotate your forearm further until you feel a gentle to moderate stretch. Hold this position for __________ seconds. Slowly release the stretch and return to the starting position. Repeat __________ times. Complete this exercise __________ times a day. Passive elbow flexion, supine Lie on your back (supine position). Extend your left / right arm up in the air, bracing it with your other hand. Let your left / right hand slowly lower toward your shoulder (passive  flexion), while your elbow stays pointed toward the ceiling. You should feel a gentle stretch along the back of your upper arm and elbow. If instructed by your health care provider, you may increase the intensity of your stretch by adding a small wrist weight or hand weight. Hold this position for __________ seconds. Slowly return to the starting position. Repeat __________ times. Complete this exercise __________ times a day. Passive elbow extension, supine  Lie on your back (supine position). Make sure that you are in a comfortable position that lets you relax your arm muscles. Place a folded towel under your left / right upper arm so your elbow and shoulder are at the same height. Straighten your left / right arm so your elbow does not rest on the bed or towel. Let the weight of your hand stretch your elbow (passive extension). Keep your arm and chest muscles relaxed. You should feel a stretch on the inside of your elbow. If told by your health care provider, you may increase the intensity of your stretch by adding a small wrist weight or hand weight. Hold this position for __________ seconds. Slowly release the stretch. Repeat __________ times. Complete this exercise __________ times a day. Strengthening exercises These exercises build strength and endurance in your elbow and forearm. Endurance is the ability to use your muscles for a long time, even after they get tired. Elbow flexion, isometric  Stand or sit up straight. Bend your left / right elbow in a 90-degree angle (right angle), and keep your forearm at the height of your waist. Your thumb should be pointed toward the ceiling (neutral forearm). Place your other hand on top of your left / right forearm. Gently push down while you resist with your left / right arm (isometric flexion). Push as hard as you can with both arms without causing any pain or movement at your left / right elbow. Hold this position for __________  seconds. Slowly release the tension in both arms. Let your muscles relax completely before you repeat the exercise. Repeat __________ times. Complete this exercise __________ times a day. Elbow extension, isometric  Stand or sit up straight.  Place your left / right arm so your palm faces your abdomen and is at the height of your waist. Place your other hand on the underside of your left / right forearm. Gently push up while you resist with your left / right arm (isometric extension). Push as hard as you can with both arms without causing any pain or movement at your left / right elbow. Hold this position for __________ seconds. Slowly release the tension in both arms. Let your muscles relax completely before you repeat the exercise. Repeat __________ times. Complete this exercise __________ times a day. Elbow flexion with forearm palm up  Sit on a firm chair without armrests, or stand up. Place your left / right arm at your side with your elbow straight and your palm facing forward. Holding a __________weight or gripping a rubber exercise band or tubing, bend your elbow to bring your hand toward your shoulder (flexion). Hold this position for __________ seconds. Slowly return to the starting position. Repeat __________ times. Complete this exercise __________ times a day. Elbow extension, active  Sit on a firm chair without armrests, or stand up. Hold a rubber exercise band or tubing in both hands. Keeping your upper arms at your sides, bring both hands up to your left / right shoulder. Keep your left / right hand just below your other hand. Straighten your left / right elbow (extension) while keeping your other arm still. Hold this position for __________ seconds. Control the resistance of the band or tubing as you return to the starting position. Repeat __________ times. Complete this exercise __________ times a day. Forearm rotation, supination  Sit with your left / right forearm  supported on a table. Your elbow should be at waist height and bent at a 90-degree angle (right angle). Gently grasp a lightweight hammer. Rest your hand over the edge of the table with your palm facing down. Without moving your left / right elbow, slowly rotate your forearm to turn your palm up toward the ceiling (supination). Hold this position for __________ seconds. Slowly return to the starting position. Repeat __________ times. Complete this exercise __________ times a day. Forearm rotation, pronation  Sit with your left / right forearm supported on a table. Keep your elbow below shoulder height. Gently grasp a lightweight hammer. Rest your hand over the edge of the table with your palm facing up. Without moving your left / right elbow, slowly rotate your forearm to turn your palm down toward the floor (pronation). Hold this position for __________seconds. Slowly return to the starting position. Repeat __________ times. Complete this exercise __________ times a day. This information is not intended to replace advice given to you by your health care provider. Make sure you discuss any questions you have with your health care provider. Document Revised: 01/25/2019 Document Reviewed: 10/25/2018 [employer] Patient Education  [place] Band Syndrome Rehab Ask your health care provider which exercises are safe for you. Do exercises exactly as told by your health care provider and adjust them as directed. It is normal to feel mild stretching, pulling, tightness, or discomfort as you do these exercises. Stop right away if you feel sudden pain or your pain gets significantly worse. Do not begin these exercises until told by your health care provider. Stretching and range-of-motion exercises These exercises warm up your muscles and joints and improve the movement and flexibility of your hip and pelvis. Quadriceps stretch, prone  Lie on your abdomen (prone position) on a firm  surface, such as a bed or padded floor. Bend your left / right knee and reach back to hold your ankle or pant leg. If you cannot reach your ankle or pant leg, loop a belt around your foot and grab the belt instead. Gently pull your heel toward your buttocks. Your knee should not slide out to the side. You should feel a stretch in the front of your thigh and knee (quadriceps). Hold this position for __________ seconds. Repeat __________ times. Complete this exercise __________ times a day. Iliotibial band stretch An iliotibial band is a strong band of muscle tissue that runs from the outer side of your hip to the outer side of your thigh and knee. Lie on your side with your left / right leg in the top position. Bend both of your knees and grab your left / right ankle. Stretch out your bottom arm to help you balance. Slowly bring your top knee back so your thigh goes behind your trunk. Slowly lower your top leg toward the floor until you feel a gentle stretch on the outside of your left / right hip and thigh. If you do not feel a stretch and your knee will not fall farther, place the heel of your other foot on top of your knee and pull your knee down toward the floor with your foot. Hold this position for __________ seconds. Repeat __________ times. Complete this exercise __________ times a day. Strengthening exercises These exercises build strength and endurance in your hip and pelvis. Endurance is the ability to use your muscles for a long time, even after they get tired. Straight leg raises, side-lying This exercise strengthens the muscles that rotate the leg at the hip and move it away from your body (hip abductors). Lie on your side with your left / right leg in the top position. Lie so your head, shoulder, hip, and knee line up. You may bend your bottom knee to help you balance. Roll your hips slightly forward so your hips are stacked directly over each other and your left / right knee is  facing forward. Tense the muscles in your outer thigh and lift your top leg 4-6 inches (10-15 cm). Hold this position for __________ seconds. Slowly lower your leg to return to the starting position. Let your muscles relax completely before doing another repetition. Repeat __________ times. Complete this exercise __________ times a day. Leg raises, prone This exercise strengthens the muscles that move the hips backward (hip extensors). Lie on your abdomen (prone position) on your bed or a firm surface. You can put a pillow under your hips if that is more comfortable for your lower back. Bend your left / right knee so your foot is straight up in the air. Squeeze your buttocks muscles and lift your left / right thigh off the bed. Do not let your back arch. Tense your thigh muscle as hard as you can without increasing any knee pain. Hold this position for __________ seconds. Slowly lower your leg to return to the starting position and allow it to relax completely. Repeat __________ times. Complete this exercise __________ times a day. Hip hike Stand sideways on a bottom step. Stand on your left / right leg with your other foot unsupported next to the step. You can hold on to a railing or wall for balance if needed. Keep your knees straight and your torso square. Then lift your left / right hip up toward the ceiling. Slowly let your left / right hip  lower toward the floor, past the starting position. Your foot should get closer to the floor. Do not lean or bend your knees. Repeat __________ times. Complete this exercise __________ times a day. This information is not intended to replace advice given to you by your health care provider. Make sure you discuss any questions you have with your health care provider. Document Revised: 12/12/2019 Document Reviewed: 12/12/2019 Elsevier Patient Education  [place].

## 2021-10-19 ENCOUNTER — Other Ambulatory Visit: Payer: Self-pay | Admitting: Physician Assistant

## 2021-10-19 ENCOUNTER — Other Ambulatory Visit: Payer: Self-pay | Admitting: Cardiovascular Disease

## 2021-10-21 ENCOUNTER — Ambulatory Visit: Payer: Medicare Other | Admitting: Rheumatology

## 2021-10-27 ENCOUNTER — Ambulatory Visit: Payer: Medicare Other | Admitting: Rheumatology

## 2021-10-29 NOTE — Progress Notes (Signed)
Cardiology Office Note:    Date:  10/30/2021   ID:  Jeffrey Hudson, DOB [birth year], MRN [id number]  PCP:  Dorothyann Peng, NP  [facility] Providers Cardiologist:  Lauree Chandler, MD    Referring MD: Dorothyann Peng, NP   Chief Complaint:  F/u for CAD    Patient Profile: Coronary artery disease  Ant-Lat STEMI in 2009 s/p BMS to Lg Dx branch Cath 4/11: Dx stent patent w 40-50 ISR, LAD several 20, LCx 65, dRCA 30, PLA 60 Myoview 2016 - no ischemia COPD +Cigs Hypertension  Hyperlipidemia  Pre-Diabetes mellitus  Bipolar D/o  Prior CV Studies Myoview 12/19/2014 No ischemia, EF 64    History of Present Illness:   Jeffrey Hudson is a 71 y.o. male with the above problem list.  He was last seen by Ermalinda Barrios, PA-C in 10/21.  He returns for follow-up.  He is here alone.  Overall, he is doing well.  He has not had chest discomfort, significant shortness of breath, syncope, orthopnea, leg edema.  His blood pressures have been running higher.  At one visit recently, his systolic was in the 536R.  He typically has systolic readings in the 443X at home.  He had angioedema with ACE inhibitor's in the past.  He also had significant side effects of leg swelling with amlodipine.  He takes HCTZ 12.5 mg twice daily and would like to take this once a day.  He is down to 3 to 4 cigarettes a day and is trying to quit.        Past Medical History:  Diagnosis Date   AMI (acute myocardial infarction) ([place])    Acute ST elevation   Anxiety    Arthritis    neck   Bipolar disorder ([place])    COPD (chronic obstructive pulmonary disease) ([place])    Coronary artery disease    a. s/p anterolateral STEMI with BMS placed in large diagonal branch (2009).   Emphysema of lung ([place])    GERD (gastroesophageal reflux disease)    Glaucoma    Hx of adenomatous polyp of colon 05/06/2021   diminutive - no f/u needed   Hyperlipidemia    Hypertension    Nodule of left lung    6-mm smooothly rounded  nodule   Plantar fasciitis    left foot   Pre-diabetes    Syncope and collapse    in 2001 and 2004 with no clear cause   Tobacco abuse    Viral URI with cough 11/22/2016   Current Medications: Current Meds  Medication Sig   albuterol (PROAIR HFA) 108 (90 Base) MCG/ACT inhaler Inhale 2 puffs into the lungs every 6 (six) hours as needed.   aspirin 81 MG tablet Take 81 mg by mouth daily.   atorvastatin (LIPITOR) 80 MG tablet TAKE 1 TABLET BY MOUTH  DAILY AT 6 PM. Please keep upcoming appt in January 2023 with Cardiologist before anymore refills. Thank you Final Attempt   betamethasone dipropionate 0.05 % cream as needed.    doxazosin (CARDURA) 1 MG tablet Take 1 tablet (1 mg total) by mouth at bedtime.   ezetimibe (ZETIA) 10 MG tablet TAKE 1 TABLET BY MOUTH DAILY   FLUoxetine (PROZAC) 20 MG capsule Take 1 capsule (20 mg total) by mouth daily.   Glycopyrrolate-Formoterol (BEVESPI AEROSPHERE) 9-4.8 MCG/ACT AERO Inhale 2 puffs into the lungs 2 (two) times daily.   hydrochlorothiazide (HYDRODIURIL) 25 MG tablet Take 1 tablet (25 mg total) by mouth daily.   latanoprost (  XALATAN) 0.005 % ophthalmic solution SMARTSIG:1 Drop(s) In Eye(s) Every Evening   meloxicam (MOBIC) 7.5 MG tablet Take 1 tablet (7.5 mg total) by mouth daily.   metoprolol succinate (TOPROL-XL) 25 MG 24 hr tablet TAKE 1 TABLET BY MOUTH ONCE DAILY   Multiple Vitamin (MULTIVITAMIN) tablet Take 1 tablet by mouth daily.   nitroGLYCERIN (NITROSTAT) 0.4 MG SL tablet Place 0.4 mg under the tongue every 5 (five) minutes as needed for chest pain.   pantoprazole (PROTONIX) 40 MG tablet Take 1 tablet (40 mg total) by mouth daily before breakfast.   timolol (BETIMOL) 0.25 % ophthalmic solution Place 1-2 drops into both eyes daily.   travoprost, benzalkonium, (TRAVATAN) 0.004 % ophthalmic solution Place 1 drop into both eyes at bedtime.   [DISCONTINUED] hydrochlorothiazide (MICROZIDE) 12.5 MG capsule TAKE 1 CAPSULE BY MOUTH  TWICE DAILY     Allergies:   Lisinopril, Metformin and related, and Norvasc [amlodipine]   Social History   Tobacco Use   Smoking status: Every Day    Packs/day: 0.25    Years: 45.00    Pack years: 11.25    Types: Cigarettes   Smokeless tobacco: Never   Tobacco comments:    Patient states he's down to smoking 5 cigarettes a day/night  Vaping Use   Vaping Use: Never used  Substance Use Topics   Alcohol use: Yes    Comment: 2 drinks daily   Drug use: No    Comment: marijuana quit 2010. He has hx of THC use.    Family Hx: The patient's family history includes Alcohol abuse (age of onset: 7) in his father; Dementia (age of onset: 24) in his mother; Depression in his brother; Healthy in his son. There is no history of Colon cancer, Esophageal cancer, Rectal cancer, or Stomach cancer.  Review of Systems  Cardiovascular:  Negative for claudication.    EKGs/Labs/Other Test Reviewed:    EKG:  EKG is  ordered today.  The ekg ordered today demonstrates NSR, HR 61, left axis deviation, QTC 420, no change from prior tracing  Recent Labs: 01/15/2021: ALT 20; BUN 25; Creatinine, Ser 0.89; Hemoglobin 13.9; Platelets 223.0; Potassium 4.0; Sodium 138; TSH 3.40   Recent Lipid Panel Recent Labs    01/15/21 0926  CHOL 174  TRIG 160.0*  HDL 64.70  VLDL 32.0  LDLCALC 77     Risk Assessment/Calculations:         Physical Exam:    VS:  BP (!) 158/80    Pulse 61    Ht $R'5\' 6"'lD$  (1.676 m)    Wt 157 lb 12.8 oz (71.6 kg)    SpO2 98%    BMI 25.47 kg/m     Wt Readings from Last 3 Encounters:  10/30/21 157 lb 12.8 oz (71.6 kg)  10/01/21 157 lb (71.2 kg)  09/04/21 153 lb 3.2 oz (69.5 kg)    Constitutional:      Appearance: Healthy appearance. Not in distress.  Neck:     Vascular: No carotid bruit or JVR. JVD normal.  Pulmonary:     Effort: Pulmonary effort is normal.     Breath sounds: No wheezing. No rales.  Cardiovascular:     Normal rate. Regular rhythm. Normal S1. Normal S2.      Murmurs: There  is no murmur.  Pulses:    Intact distal pulses.  Edema:    Peripheral edema absent.  Abdominal:     Palpations: Abdomen is soft.  Skin:    General: Skin is  warm and dry.  Neurological:     General: No focal deficit present.     Mental Status: Alert and oriented to person, place and time.     Cranial Nerves: Cranial nerves are intact.        ASSESSMENT & PLAN:   CAD (coronary artery disease) History of anterolateral STEMI in 2009 through the bare-metal stent to the diagonal.  He had a patent stent in the diagonal by catheterization in 2011 and mild to moderate nonobstructive disease elsewhere.  Nuclear stress test in 2016 was negative for ischemia.  He is doing well without anginal symptoms.  His EKG is unchanged.  Continue aspirin 81 mg daily, atorvastatin 80 mg daily, metoprolol succinate 25 mg daily.  Follow-up 1 year.  Hyperlipidemia LDL slightly above goal in March (77).  Goal is <70.  Continue atorvastatin 80 mg daily, ezetimibe 10 mg daily.  Obtain fasting CMET, lipids today.  If LDL still above goal, consider changing atorvastatin to rosuvastatin or referral to Pharm.D. lipid clinic for consideration of PCSK9 habit or therapy.  TOBACCO ABUSE He is trying to quit.  Essential hypertension Blood pressure is above goal.  His heart rate is 60 and I do not think he can tolerate an increase in his metoprolol succinate.  He has a history of angioedema to ACE inhibitor's.  He could not tolerate amlodipine due to leg edema.  He would like to take HCTZ once a day. Change HCTZ from 12.5 mg twice daily to 25 mg once daily Start doxazosin 1 mg nightly I warned him of the potential for orthostatic hypotension after his first couple doses Follow-up with Pharm.D. in 3 to 4 weeks with blood pressure machine and home readings           Dispo:  Return in about 1 year (around 10/30/2022) for Routine Follow Up with Dr. Angelena Form.   Medication Adjustments/Labs and Tests Ordered: Current medicines  are reviewed at length with the patient today.  Concerns regarding medicines are outlined above.  Tests Ordered: Orders Placed This Encounter  Procedures   Comp Met (CMET)   Lipid panel   Ambulatory referral to Advanced Hypertension Clinic - [facility] [place]   EKG 12-Lead   Medication Changes: Meds ordered this encounter  Medications   doxazosin (CARDURA) 1 MG tablet    Sig: Take 1 tablet (1 mg total) by mouth at bedtime.    Dispense:  90 tablet    Refill:  1   hydrochlorothiazide (HYDRODIURIL) 25 MG tablet    Sig: Take 1 tablet (25 mg total) by mouth daily.    Dispense:  90 tablet    Refill:  3   Signed, Richardson Dopp, PA-C  10/30/2021 9:17 AM    [facility] [address], [address], [place]  [address] Phone: [phone]; Fax: [phone]

## 2021-10-30 ENCOUNTER — Encounter: Payer: Self-pay | Admitting: Physician Assistant

## 2021-10-30 ENCOUNTER — Other Ambulatory Visit: Payer: Self-pay

## 2021-10-30 ENCOUNTER — Ambulatory Visit: Payer: Medicare Other | Admitting: Physician Assistant

## 2021-10-30 VITALS — BP 158/80 | HR 61 | Ht 66.0 in | Wt 157.8 lb

## 2021-10-30 DIAGNOSIS — F17200 Nicotine dependence, unspecified, uncomplicated: Secondary | ICD-10-CM

## 2021-10-30 DIAGNOSIS — I10 Essential (primary) hypertension: Secondary | ICD-10-CM | POA: Diagnosis not present

## 2021-10-30 DIAGNOSIS — I2510 Atherosclerotic heart disease of native coronary artery without angina pectoris: Secondary | ICD-10-CM | POA: Diagnosis not present

## 2021-10-30 DIAGNOSIS — E7800 Pure hypercholesterolemia, unspecified: Secondary | ICD-10-CM

## 2021-10-30 LAB — COMPREHENSIVE METABOLIC PANEL
ALT: 29 IU/L (ref 0–44)
AST: 32 IU/L (ref 0–40)
Albumin/Globulin Ratio: 2.6 — ABNORMAL HIGH (ref 1.2–2.2)
Albumin: 4.5 g/dL (ref 3.7–4.7)
Alkaline Phosphatase: 97 IU/L (ref 44–121)
BUN/Creatinine Ratio: 24 (ref 10–24)
BUN: 26 mg/dL (ref 8–27)
Bilirubin Total: 1.2 mg/dL (ref 0.0–1.2)
CO2: 24 mmol/L (ref 20–29)
Calcium: 9.8 mg/dL (ref 8.6–10.2)
Chloride: 100 mmol/L (ref 96–106)
Creatinine, Ser: 1.09 mg/dL (ref 0.76–1.27)
Globulin, Total: 1.7 g/dL (ref 1.5–4.5)
Glucose: 101 mg/dL — ABNORMAL HIGH (ref 70–99)
Potassium: 4.3 mmol/L (ref 3.5–5.2)
Sodium: 139 mmol/L (ref 134–144)
Total Protein: 6.2 g/dL (ref 6.0–8.5)
eGFR: 72 mL/min/{1.73_m2} (ref >59)

## 2021-10-30 LAB — LIPID PANEL
Chol/HDL Ratio: 2.2 ratio (ref 0.0–5.0)
Cholesterol, Total: 165 mg/dL (ref 100–199)
HDL: 74 mg/dL (ref >39)
LDL Chol Calc (NIH): 75 mg/dL (ref 0–99)
Triglycerides: 89 mg/dL (ref 0–149)
VLDL Cholesterol Cal: 16 mg/dL (ref 5–40)

## 2021-10-30 MED ORDER — DOXAZOSIN MESYLATE 1 MG PO TABS: 1.0000 mg | ORAL_TABLET | Freq: Every day | ORAL | 1 refills | Status: DC

## 2021-10-30 MED ORDER — NITROGLYCERIN 0.4 MG SL SUBL
0.4000 mg | SUBLINGUAL_TABLET | SUBLINGUAL | 11 refills | Status: AC | PRN
Start: 2021-10-30 — End: ?

## 2021-10-30 MED ORDER — HYDROCHLOROTHIAZIDE 25 MG PO TABS: 25.0000 mg | ORAL_TABLET | Freq: Every day | ORAL | 3 refills | Status: DC

## 2021-10-30 NOTE — Assessment & Plan Note (Signed)
Blood pressure is above goal.  His heart rate is 60 and I do not think he can tolerate an increase in his metoprolol succinate.  He has a history of angioedema to ACE inhibitor's.  He could not tolerate amlodipine due to leg edema.  He would like to take HCTZ once a day.  Change HCTZ from 12.5 mg twice daily to 25 mg once daily  Start doxazosin 1 mg nightly  I warned him of the potential for orthostatic hypotension after his first couple doses  Follow-up with Pharm.D. in 3 to 4 weeks with blood pressure machine and home readings

## 2021-10-30 NOTE — Patient Instructions (Addendum)
Medication Instructions:  Your physician has recommended you make the following change in your medication:   START Doxasozin 1 mg taking 1 tablet at bed time  CHANGE the Hydrochlorothiazide to 25 mg taking 1 tablet daily    *If you need a refill on your cardiac medications before your next appointment, please call your pharmacy*   Lab Work: TODAY:  CMET & LIPID  If you have labs (blood work) drawn today and your tests are completely normal, you will receive your results only by: [place] (if you have MyChart) OR A paper copy in the mail If you have any lab test that is abnormal or we need to change your treatment, we will call you to review the results.   Testing/Procedures: None ordered   You have been referred to See a Pharmacist in our Hypertension Clinic, in 3-4 weeks.  Please bring your blood pressure cuff with you to this appointment.   Follow-Up: At [facility], you and your health needs are our priority.  As part of our continuing mission to provide you with exceptional heart care, we have created designated Provider Care Teams.  These Care Teams include your primary Cardiologist (physician) and Advanced Practice Providers (APPs -  Physician Assistants and Nurse Practitioners) who all work together to provide you with the care you need, when you need it.  We recommend signing up for the patient portal called "MyChart".  Sign up information is provided on this After Visit Summary.  MyChart is used to connect with patients for Virtual Visits (Telemedicine).  Patients are able to view lab/test results, encounter notes, upcoming appointments, etc.  Non-urgent messages can be sent to your provider as well.   To learn more about what you can do with MyChart, go to [email or web address].    Your next appointment:   12 month(s)  The format for your next appointment:   In Person  Provider:   Lauree Chandler, MD  or Richardson Dopp, PA-C         Other  Instructions

## 2021-10-30 NOTE — Assessment & Plan Note (Signed)
History of anterolateral STEMI in 2009 through the bare-metal stent to the diagonal.  He had a patent stent in the diagonal by catheterization in 2011 and mild to moderate nonobstructive disease elsewhere.  Nuclear stress test in 2016 was negative for ischemia.  He is doing well without anginal symptoms.  His EKG is unchanged.  Continue aspirin 81 mg daily, atorvastatin 80 mg daily, metoprolol succinate 25 mg daily.  Follow-up 1 year.

## 2021-10-30 NOTE — Assessment & Plan Note (Signed)
LDL slightly above goal in March (77).  Goal is <70.  Continue atorvastatin 80 mg daily, ezetimibe 10 mg daily.  Obtain fasting CMET, lipids today.  If LDL still above goal, consider changing atorvastatin to rosuvastatin or referral to Pharm.D. lipid clinic for consideration of PCSK9 habit or therapy.

## 2021-10-30 NOTE — Assessment & Plan Note (Signed)
He is trying to quit.  

## 2021-11-02 ENCOUNTER — Other Ambulatory Visit: Payer: Self-pay | Admitting: *Deleted

## 2021-11-03 ENCOUNTER — Other Ambulatory Visit: Payer: Self-pay | Admitting: *Deleted

## 2021-11-03 MED ORDER — ROSUVASTATIN CALCIUM 40 MG PO TABS: 40.0000 mg | ORAL_TABLET | Freq: Every day | ORAL | 3 refills | Status: DC

## 2021-11-10 ENCOUNTER — Other Ambulatory Visit: Payer: Self-pay

## 2021-11-10 ENCOUNTER — Telehealth: Payer: Self-pay | Admitting: Cardiovascular Disease

## 2021-11-10 MED ORDER — DOXAZOSIN MESYLATE 1 MG PO TABS: 1.0000 mg | ORAL_TABLET | Freq: Every day | ORAL | 3 refills | Status: DC

## 2021-11-10 NOTE — Telephone Encounter (Signed)
°*  STAT* If patient is at the pharmacy, call can be transferred to refill team.   1. Which medications need to be refilled? (please list name of each medication and dose if known)  doxazosin (CARDURA) 1 MG tablet  2. Which pharmacy/location (including street and city if local pharmacy) is medication to be sent to? [address], [place], [place] [address]   3. Do they need a 30 day or 90 day supply?  90 day supply  Patient states [facility] has the medication on back order and he is requesting to have it transferred to [facility] local [facility], listed above. Please assist.

## 2021-11-19 ENCOUNTER — Ambulatory Visit: Payer: Medicare Other

## 2021-11-24 ENCOUNTER — Telehealth: Payer: Self-pay | Admitting: Cardiovascular Disease

## 2021-11-24 NOTE — Telephone Encounter (Signed)
Pt would like to know how long he needs to be on Doxazosin before coming in for his HTN follow up. Pt is starting the meds today... please advise.

## 2021-11-24 NOTE — Telephone Encounter (Signed)
Patient cancel the appt said he will call back when he thinks he needs his bp to be check. Please advise

## 2021-11-24 NOTE — Telephone Encounter (Signed)
Left message for patient to call back.  Blood pressure medication changes were made by APP at last visit and he has an appointment with PharmD on 12/15/21.

## 2021-11-24 NOTE — Telephone Encounter (Signed)
Recommend following up 2-3 weeks after starting on the Doxazosin. He can see the PharmD HTN clinic.  I can see him if he would rather do that. Richardson Dopp, PA-C    11/24/2021 5:02 PM

## 2021-11-25 NOTE — Telephone Encounter (Signed)
Lvm to go over Scott's recommendations.

## 2021-11-30 NOTE — Telephone Encounter (Signed)
Sent pt mychart message

## 2021-12-01 ENCOUNTER — Other Ambulatory Visit: Payer: Self-pay | Admitting: Cardiovascular Disease

## 2021-12-01 NOTE — Telephone Encounter (Signed)
Ok.  Just schedule f/u with me in 3 mos. Richardson Dopp, PA-C    12/01/2021 1:06 PM

## 2021-12-01 NOTE — Telephone Encounter (Unsigned)
Pt was scheduled X 2 with pharmacist canceled twice.

## 2021-12-04 ENCOUNTER — Ambulatory Visit (INDEPENDENT_AMBULATORY_CARE_PROVIDER_SITE_OTHER): Payer: Medicare Other

## 2021-12-04 ENCOUNTER — Encounter: Payer: Self-pay | Admitting: Adult Health

## 2021-12-04 ENCOUNTER — Ambulatory Visit (INDEPENDENT_AMBULATORY_CARE_PROVIDER_SITE_OTHER): Payer: Medicare Other | Admitting: Adult Health

## 2021-12-04 ENCOUNTER — Other Ambulatory Visit: Payer: Self-pay

## 2021-12-04 VITALS — BP 110/60 | HR 97 | Temp 99.1°F | Ht 66.0 in | Wt 157.0 lb

## 2021-12-04 DIAGNOSIS — M79644 Pain in right finger(s): Secondary | ICD-10-CM

## 2021-12-04 DIAGNOSIS — M79641 Pain in right hand: Secondary | ICD-10-CM

## 2021-12-04 NOTE — Progress Notes (Signed)
Subjective:    Patient ID: Jeffrey Hudson, male    DOB: 1951-06-17, 71 y.o.   MRN: [id number]  HPI 71 year old male who  has a past medical history of AMI (acute myocardial infarction) ([place]), Anxiety, Arthritis, Bipolar disorder ([place]), COPD (chronic obstructive pulmonary disease) ([place]), Coronary artery disease, Emphysema of lung ([place]), GERD (gastroesophageal reflux disease), Glaucoma, adenomatous polyp of colon (05/06/2021), Hyperlipidemia, Hypertension, Nodule of left lung, Plantar fasciitis, Pre-diabetes, Syncope and collapse, Tobacco abuse, and Viral URI with cough (11/22/2016).  He presents to the office today for concern of injury to his right fifth digit.  Reports that he roughly 6 weeks ago he was walking downtown and tripped on the sidewalk when he fell he put his right hand out to brace his fall which caused pain to his right pinky finger.  He has had prior deformity to the right pinky finger, reports that he has more pain than normal.  Is unable to completely close his hand but this is not abnormal for him  Review of Systems See HPI   Past Medical History:  Diagnosis Date   AMI (acute myocardial infarction) ([place])    Acute ST elevation   Anxiety    Arthritis    neck   Bipolar disorder ([place])    COPD (chronic obstructive pulmonary disease) ([place])    Coronary artery disease    a. s/p anterolateral STEMI with BMS placed in large diagonal branch (2009).   Emphysema of lung ([facility])    GERD (gastroesophageal reflux disease)    Glaucoma    Hx of adenomatous polyp of colon 05/06/2021   diminutive - no f/u needed   Hyperlipidemia    Hypertension    Nodule of left lung    6-mm smooothly rounded nodule   Plantar fasciitis    left foot   Pre-diabetes    Syncope and collapse    in 2001 and 2004 with no clear cause   Tobacco abuse    Viral URI with cough 11/22/2016    Social History   Socioeconomic History   Marital status: Married    Spouse name: Not on file   Number of  children: 1   Years of education: Not on file   Highest education level: Bachelor's degree (e.g., BA, AB, BS)  Occupational History   Occupation: Retired-Sales/broker  Tobacco Use   Smoking status: Every Day    Packs/day: 0.25    Years: 45.00    Pack years: 11.25    Types: Cigarettes   Smokeless tobacco: Never   Tobacco comments:    Patient states he's down to smoking 5 cigarettes a day/night  Vaping Use   Vaping Use: Never used  Substance and Sexual Activity   Alcohol use: Yes    Comment: 2 drinks daily   Drug use: No    Comment: marijuana quit 2010. He has hx of THC use.   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 - plays once a week    2 glasses of wine a day max, still smoking 2 cups of coffee a day no drug use or other tobacco   Social Determinants of Radio broadcast assistant Strain: Medium Risk  Difficulty of Paying Living Expenses: Somewhat hard  Food Insecurity: No Food Insecurity   Worried About Running Out of Food in the Last Year: Never true   Ran Out of Food in the Last Year: Never true  Transportation Needs: No Transportation Needs   Lack of Transportation (Medical): No   Lack of Transportation (Non-Medical): No  Physical Activity: Inactive   Days of Exercise per Week: 2 days   Minutes of Exercise per Session: 0 min  Stress: No Stress Concern Present   Feeling of Stress : Not at all  Social Connections: Moderately Isolated   Frequency of Communication with Friends and Family: Once a week   Frequency of Social Gatherings with Friends and Family: Once a week   Attends Religious Services: More than 4 times per year   Active Member of [employer] or Organizations: No   Attends Music therapist: Never   Marital Status: Married   Human resources officer Violence: Not At Risk   Fear of Current or Ex-Partner: No   Emotionally Abused: No   Physically Abused: No   Sexually Abused: No    Past Surgical History:  Procedure Laterality Date   COLONOSCOPY     PERCUTANEOUS CORONARY STENT INTERVENTION (PCI-S)     UPPER GASTROINTESTINAL ENDOSCOPY      Family History  Problem Relation Age of Onset   Dementia Mother 34       died   Alcohol abuse Father 32   Depression Brother    Healthy Son    Colon cancer Neg Hx    Esophageal cancer Neg Hx    Rectal cancer Neg Hx    Stomach cancer Neg Hx     Allergies  Allergen Reactions   Lisinopril Swelling    Angio edema    Metformin And Related Diarrhea   Norvasc [Amlodipine] Swelling    Lower extremity     Current Outpatient Medications on File Prior to Visit  Medication Sig Dispense Refill   rosuvastatin (CRESTOR) 40 MG tablet Take 1 tablet (40 mg total) by mouth daily. 90 tablet 3   albuterol (PROAIR HFA) 108 (90 Base) MCG/ACT inhaler Inhale 2 puffs into the lungs every 6 (six) hours as needed. 1 Inhaler 3   aspirin 81 MG tablet Take 81 mg by mouth daily.     betamethasone dipropionate 0.05 % cream as needed.      doxazosin (CARDURA) 1 MG tablet Take 1 tablet (1 mg total) by mouth at bedtime. 90 tablet 3   ezetimibe (ZETIA) 10 MG tablet TAKE 1 TABLET BY MOUTH DAILY 30 tablet 11   FLUoxetine (PROZAC) 20 MG capsule Take 1 capsule (20 mg total) by mouth daily. 90 capsule 2   Glycopyrrolate-Formoterol (BEVESPI AEROSPHERE) 9-4.8 MCG/ACT AERO Inhale 2 puffs into the lungs 2 (two) times daily. 10.7 g 5   hydrochlorothiazide (HYDRODIURIL) 25 MG tablet Take 1 tablet (25 mg total) by mouth daily. 90 tablet 3   latanoprost (XALATAN) 0.005 % ophthalmic solution SMARTSIG:1 Drop(s) In Eye(s) Every Evening     meloxicam (MOBIC) 7.5 MG tablet Take 1 tablet (7.5 mg total) by mouth daily. 90 tablet 1   metoprolol succinate (TOPROL-XL) 25 MG 24 hr tablet TAKE 1 TABLET BY MOUTH ONCE  DAILY  30 tablet 11   Multiple Vitamin (MULTIVITAMIN) tablet Take 1 tablet by mouth daily.     nitroGLYCERIN (NITROSTAT) 0.4 MG SL tablet Place 1 tablet (0.4 mg total) under the tongue every 5 (five) minutes as needed for chest pain.  25 tablet 11   pantoprazole (PROTONIX) 40 MG tablet Take 1 tablet (40 mg total) by mouth daily before breakfast. 90 tablet 3   timolol (BETIMOL) 0.25 % ophthalmic solution Place 1-2 drops into both eyes daily.     travoprost, benzalkonium, (TRAVATAN) 0.004 % ophthalmic solution Place 1 drop into both eyes at bedtime.     No current facility-administered medications on file prior to visit.    BP 110/60    Pulse 97    Temp 99.1 F (37.3 C) (Oral)    Ht 5\' 6"  (1.676 m)    Wt 157 lb (71.2 kg)    SpO2 (!) 63%    BMI 25.34 kg/m       Objective:   Physical Exam Vitals and nursing note reviewed.  Constitutional:      Appearance: Normal appearance.  Musculoskeletal:     Right hand: Deformity and bony tenderness present. Decreased strength.     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  Skin:    Capillary Refill: Capillary refill takes less than 2 seconds.  Neurological:     General: No focal deficit present.     Mental Status: He is alert and oriented to person, place, and time.  Psychiatric:        Mood and Affect: Mood normal.        Behavior: Behavior normal.        Thought Content: Thought content normal.      Assessment & Plan:  1. Finger pain, right  - DG Hand Complete Right; Future - Ambulatory referral to Hand Surgery for palmer fibromatosis    Dorothyann Peng, NP  Time spent on chart review, time with patient; discussion of  hand pain, treatment, follow up plan, and documentation 30 minutes

## 2021-12-04 NOTE — Patient Instructions (Signed)
Health Maintenance Due  Topic Date Due   COVID-19 Vaccine (5 - Booster for Pfizer series) 03/28/2021    Depression screen PHQ 2/9 06/02/2021 03/11/2021 02/04/2015  Decreased Interest 0 0 0  Down, Depressed, Hopeless 0 2 0  PHQ - 2 Score 0 2 0  Altered sleeping - 0 -  Tired, decreased energy - 0 -  Change in appetite - 0 -  Feeling bad or failure about yourself  - 0 -  Trouble concentrating - 0 -  Moving slowly or fidgety/restless - 0 -  Suicidal thoughts - 0 -  PHQ-9 Score - 2 -  Difficult doing work/chores - Not difficult at all -  Some recent data might be hidden

## 2021-12-15 ENCOUNTER — Ambulatory Visit: Payer: Medicare Other

## 2021-12-18 DIAGNOSIS — M20021 Boutonniere deformity of right finger(s): Secondary | ICD-10-CM | POA: Diagnosis not present

## 2021-12-18 DIAGNOSIS — M1811 Unilateral primary osteoarthritis of first carpometacarpal joint, right hand: Secondary | ICD-10-CM | POA: Diagnosis not present

## 2021-12-18 DIAGNOSIS — M720 Palmar fascial fibromatosis [Dupuytren]: Secondary | ICD-10-CM | POA: Diagnosis not present

## 2022-01-26 ENCOUNTER — Ambulatory Visit (HOSPITAL_COMMUNITY): Payer: Medicare Other | Admitting: Psychiatry

## 2022-01-26 DIAGNOSIS — F325 Major depressive disorder, single episode, in full remission: Secondary | ICD-10-CM | POA: Diagnosis not present

## 2022-01-26 MED ORDER — FLUOXETINE HCL 20 MG PO CAPS: 20.0000 mg | ORAL_CAPSULE | Freq: Every day | ORAL | 2 refills | Status: DC

## 2022-01-26 NOTE — Progress Notes (Signed)
Psychiatric Initial Adult Assessment  ? ?Patient Identification: Jeffrey Hudson ?MRN:  [id number] ?Date of Evaluation:  01/26/2022 ?Referral Source: Dr. Zettie Pho ?Chief Complaint:   ?Visit Diagnosis: Bipolar disorder ? ?History of Present Illness:   ? ?Today patient is doing well.  The big family drama and conflict seems to have resolved.  He is apologize to everybody for his behavior and they seem to accept it.  He is getting along well with everyone.  His wife and him have an exceptionally good relationship.  His son is doing okay and his daughter-in-law is okay.  He has no grandchildren.  The patient is having some health issues mainly related to arthritis.  He also is having problems swallowing air.  Financially he is stable.  He likes his home.  The patient is sleeping and eating well.  He is got good energy.  He has no problems thinking and concentrating.  He enjoys the television and has a.  He enjoys music and gets on the computer a lot.  Patient takes his medicines just as prescribed.  He is in a good job cutting down his cigarettes now down to 2 cigarettes a day.  The patient drinks no alcohol and uses no drugs.  He is actually very stable. ? ? ? ?(Hypo) Manic Symptoms:   ?Anxiety Symptoms:   ?Psychotic Symptoms:   ?PTSD Symptoms: ? ? ?Past Psychiatric History: Zyprexa and Prozac, has been psychotherapy in the past including cognitive behavioral therapy. ? ?Previous Psychotropic Medications: Zyprexa and Prozac ? ?Substance Abuse History in the last 12 months:   ? ?Consequences of Substance Abuse: ? ? ?Past Medical History:  ?Past Medical History:  ?Diagnosis Date  ? AMI (acute myocardial infarction) ([place])   ? Acute ST elevation  ? Anxiety   ? Arthritis   ? neck  ? Bipolar disorder ([place])   ? COPD (chronic obstructive pulmonary disease) ([place])   ? Coronary artery disease   ? a. s/p anterolateral STEMI with BMS placed in large diagonal branch (2009).  ? Emphysema of lung ([place])   ? GERD (gastroesophageal  reflux disease)   ? Glaucoma   ? Hx of adenomatous polyp of colon 05/06/2021  ? diminutive - no f/u needed  ? Hyperlipidemia   ? Hypertension   ? Nodule of left lung   ? 6-mm smooothly rounded nodule  ? Plantar fasciitis   ? left foot  ? Pre-diabetes   ? Syncope and collapse   ? in 2001 and 2004 with no clear cause  ? Tobacco abuse   ? Viral URI with cough 11/22/2016  ?  ?Past Surgical History:  ?Procedure Laterality Date  ? COLONOSCOPY    ? PERCUTANEOUS CORONARY STENT INTERVENTION (PCI-S)    ? UPPER GASTROINTESTINAL ENDOSCOPY    ? ? ?Family Psychiatric History:  ? ?Family History:  ?Family History  ?Problem Relation Age of Onset  ? Dementia Mother 26  ?     died  ? Alcohol abuse Father 10  ? Depression Brother   ? Healthy Son   ? Colon cancer Neg Hx   ? Esophageal cancer Neg Hx   ? Rectal cancer Neg Hx   ? Stomach cancer Neg Hx   ? ? ?Social History:   ?Social History  ? ?Socioeconomic History  ? Marital status: Married  ?  Spouse name: Not on file  ? Number of children: 1  ? Years of education: Not on file  ? Highest education level: Bachelor's degree (e.g., BA, AB,  BS)  ?Occupational History  ? Occupation: Retired-Sales/broker  ?Tobacco Use  ? Smoking status: Every Day  ?  Packs/day: 0.25  ?  Years: 45.00  ?  Pack years: 11.25  ?  Types: Cigarettes  ? Smokeless tobacco: Never  ? Tobacco comments:  ?  Patient states he's down to smoking 5 cigarettes a day/night  ?Vaping Use  ? Vaping Use: Never used  ?Substance and Sexual Activity  ? Alcohol use: Yes  ?  Comment: 2 drinks daily  ? Drug use: No  ?  Comment: marijuana quit 2010. He has hx of THC use.  ? Sexual activity: Not on file  ?Other Topics Concern  ? Not on file  ?Social History Narrative  ? 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 He has been married for 35 years   ? One child (son)   ?   ? He likes to [employer] - plays once a week   ? 2 glasses of wine a day max, still smoking 2 cups of coffee a day no drug use or  other tobacco  ? ?Social Determinants of Health  ? ?Financial Resource Strain: Medium Risk  ? Difficulty of Paying Living Expenses: Somewhat hard  ?Food Insecurity: No Food Insecurity  ? Worried About Charity fundraiser in the Last Year: Never true  ? Ran Out of Food in the Last Year: Never true  ?Transportation Needs: No Transportation Needs  ? Lack of Transportation (Medical): No  ? Lack of Transportation (Non-Medical): No  ?Physical Activity: Inactive  ? Days of Exercise per Week: 2 days  ? Minutes of Exercise per Session: 0 min  ?Stress: No Stress Concern Present  ? Feeling of Stress : Not at all  ?Social Connections: Moderately Isolated  ? Frequency of Communication with Friends and Family: Once a week  ? Frequency of Social Gatherings with Friends and Family: Once a week  ? Attends Religious Services: More than 4 times per year  ? Active Member of Clubs or Organizations: No  ? Attends Archivist Meetings: Never  ? Marital Status: Married  ? ? ?Additional Social History:  ? ?Allergies:   ?Allergies  ?Allergen Reactions  ? Lisinopril Swelling  ?  Angio edema   ? Metformin And Related Diarrhea  ? Norvasc [Amlodipine] Swelling  ?  Lower extremity   ? ? ?Metabolic Disorder Labs: ?Lab Results  ?Component Value Date  ? HGBA1C 5.9 07/28/2021  ? MPG 131 06/24/2009  ? ?No results found for: PROLACTIN ?Lab Results  ?Component Value Date  ? CHOL 165 10/30/2021  ? TRIG 89 10/30/2021  ? HDL 74 10/30/2021  ? CHOLHDL 2.2 10/30/2021  ? VLDL 32.0 01/15/2021  ? [place] 75 10/30/2021  ? [place] 77 01/15/2021  ? ?Lab Results  ?Component Value Date  ? TSH 3.40 01/15/2021  ? ? ?Therapeutic Level Labs: ?No results found for: LITHIUM ?No results found for: CBMZ ?No results found for: VALPROATE ? ?Current Medications: ?Current Outpatient Medications  ?Medication Sig Dispense Refill  ? rosuvastatin (CRESTOR) 40 MG tablet Take 1 tablet (40 mg total) by mouth daily. 90 tablet 3  ? albuterol (PROAIR HFA) 108 (90 Base) MCG/ACT  inhaler Inhale 2 puffs into the lungs every 6 (six) hours as needed. 1 Inhaler 3  ? aspirin 81 MG tablet Take 81 mg by mouth daily.    ? betamethasone dipropionate 0.05 % cream as needed.     ? doxazosin (CARDURA) 1  MG tablet Take 1 tablet (1 mg total) by mouth at bedtime. 90 tablet 3  ? ezetimibe (ZETIA) 10 MG tablet TAKE 1 TABLET BY MOUTH DAILY 30 tablet 11  ? FLUoxetine (PROZAC) 20 MG capsule Take 1 capsule (20 mg total) by mouth daily. 90 capsule 2  ? Glycopyrrolate-Formoterol (BEVESPI AEROSPHERE) 9-4.8 MCG/ACT AERO Inhale 2 puffs into the lungs 2 (two) times daily. 10.7 g 5  ? hydrochlorothiazide (HYDRODIURIL) 25 MG tablet Take 1 tablet (25 mg total) by mouth daily. 90 tablet 3  ? latanoprost (XALATAN) 0.005 % ophthalmic solution SMARTSIG:1 Drop(s) In Eye(s) Every Evening    ? meloxicam (MOBIC) 7.5 MG tablet Take 1 tablet (7.5 mg total) by mouth daily. 90 tablet 1  ? metoprolol succinate (TOPROL-XL) 25 MG 24 hr tablet TAKE 1 TABLET BY MOUTH ONCE  DAILY 30 tablet 11  ? Multiple Vitamin (MULTIVITAMIN) tablet Take 1 tablet by mouth daily.    ? nitroGLYCERIN (NITROSTAT) 0.4 MG SL tablet Place 1 tablet (0.4 mg total) under the tongue every 5 (five) minutes as needed for chest pain. 25 tablet 11  ? pantoprazole (PROTONIX) 40 MG tablet Take 1 tablet (40 mg total) by mouth daily before breakfast. 90 tablet 3  ? timolol (BETIMOL) 0.25 % ophthalmic solution Place 1-2 drops into both eyes daily.    ? ?No current facility-administered medications for this visit.  ? ? ?Musculoskeletal: ?Strength & Muscle Tone: within normal limits ?Gait & Station: normal ?Patient leans: N/A ? ?Psychiatric Specialty Exam: ?ROS  ?There were no vitals taken for this visit.There is no height or weight on file to calculate BMI.  ?General Appearance: Casual  ?Eye Contact:  Good  ?Speech:  Clear and Coherent  ?Volume:  Normal  ?Mood:  Negative  ?Affect:  Appropriate  ?Thought Process:  Goal Directed  ?Orientation:  Full (Time, Place, and Person)   ?Thought Content:  WDL  ?Suicidal Thoughts:  No  ?Homicidal Thoughts:  No  ?Memory:  NA  ?Judgement:  Good  ?Insight:  NA and Good  ?Psychomotor Activity:  Normal  ?Concentration:    ?Recall:  Good  ?Fund

## 2022-01-27 ENCOUNTER — Encounter: Payer: Self-pay | Admitting: Podiatry

## 2022-01-27 ENCOUNTER — Ambulatory Visit (HOSPITAL_COMMUNITY): Payer: Medicare Other | Admitting: Psychiatry

## 2022-01-27 ENCOUNTER — Ambulatory Visit: Payer: Medicare Other | Admitting: Podiatry

## 2022-01-27 DIAGNOSIS — B351 Tinea unguium: Secondary | ICD-10-CM | POA: Diagnosis not present

## 2022-01-27 DIAGNOSIS — M79676 Pain in unspecified toe(s): Secondary | ICD-10-CM

## 2022-01-27 NOTE — Progress Notes (Signed)
This patient returns to the office for evaluation and treatment of long thick painful nails .  This patient is unable to trim his own nails since the patient cannot reach the feet.  Patient says the nails are painful walking and wearing his shoes. He returns for preventive foot care services. ? ?General Appearance  Alert, conversant and in no acute stress. ? ?Vascular  Dorsalis pedis and posterior tibial  pulses are palpable  bilaterally.  Capillary return is within normal limits  bilaterally. Temperature is within normal limits  bilaterally. ? ?Neurologic  Senn-Weinstein monofilament wire test within normal limits  bilaterally. Muscle power within normal limits bilaterally. ? ?Nails Thick disfigured discolored nails with subungual debris  from hallux to fifth toes bilaterally. No evidence of bacterial infection or drainage bilaterally. ? ?Orthopedic  No limitations of motion  feet .  No crepitus or effusions noted.  No bony pathology or digital deformities noted. ? ?Skin  normotropic skin with no porokeratosis noted bilaterally.  No signs of infections or ulcers noted.    ? ?Onychomycosis  Pain in toes right foot  Pain in toes left foot ? ?Debridement  of nails  1-5  B/L with a nail nipper.  Nails were then filed using a dremel tool with no incidents.   RTC  4 months  ? ? ?Gardiner Barefoot DPM  ?

## 2022-02-01 DIAGNOSIS — R52 Pain, unspecified: Secondary | ICD-10-CM | POA: Diagnosis not present

## 2022-02-01 DIAGNOSIS — M1811 Unilateral primary osteoarthritis of first carpometacarpal joint, right hand: Secondary | ICD-10-CM | POA: Diagnosis not present

## 2022-02-01 DIAGNOSIS — M180 Bilateral primary osteoarthritis of first carpometacarpal joints: Secondary | ICD-10-CM | POA: Diagnosis not present

## 2022-02-05 ENCOUNTER — Ambulatory Visit (HOSPITAL_COMMUNITY): Payer: Medicare Other | Admitting: Psychiatry

## 2022-02-10 IMAGING — CT CT CHEST LUNG CANCER SCREENING LOW DOSE W/O CM
2 of 4 series · 14 of 36 positions shown, 17 images · non-contrast
Comparison: Low-dose lung cancer screening chest CT 08/06/2019.

CLINICAL DATA: 68-year-old male current smoker with 50 pack-year
history of smoking. Lung cancer screening examination.

EXAM:
CT CHEST WITHOUT CONTRAST LOW-DOSE FOR LUNG CANCER SCREENING
TECHNIQUE: Multidetector CT imaging of the chest was performed following the
standard protocol without IV contrast.

[Series 3: lung thins 1.0 · axial · 0.79mm/px · z∈[+1140,+1420]mm · 11 of 309 slices shown, 14 images]
[im 15/309  mediastinal]
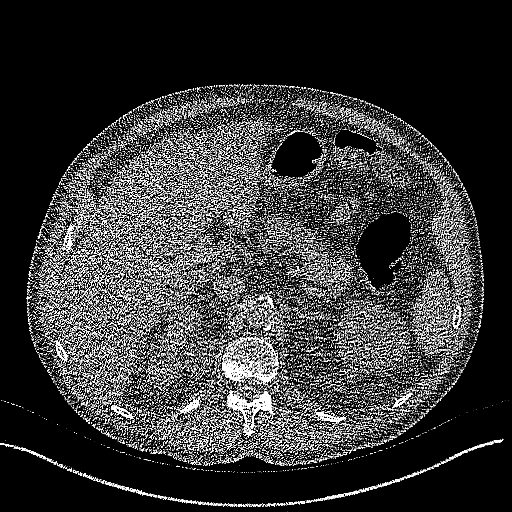
[im 15/309  lung]
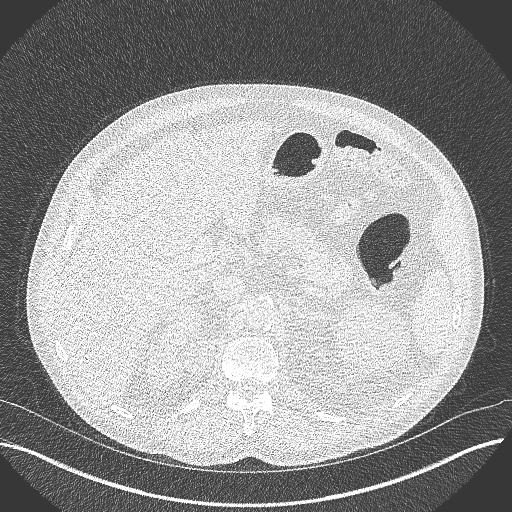
[im 43/309  lung]
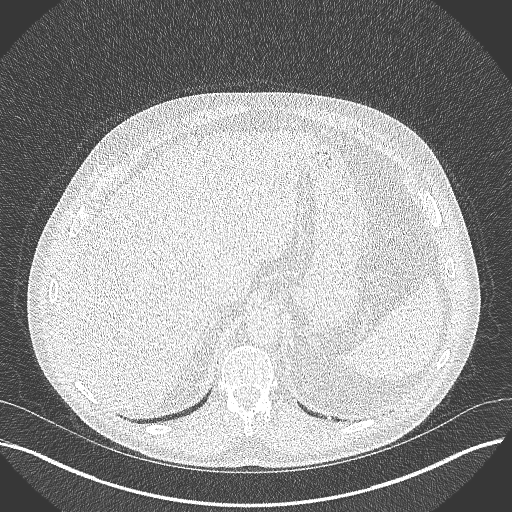
[im 71/309  lung]
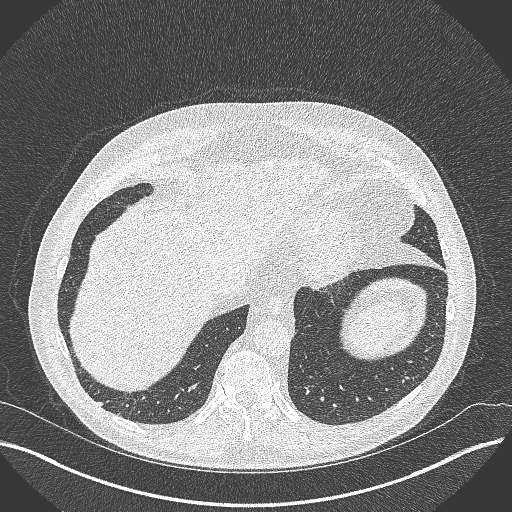
[im 99/309  lung]
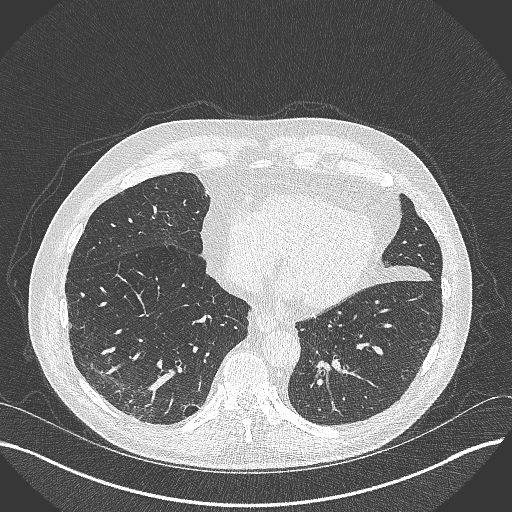
[im 127/309  mediastinal]
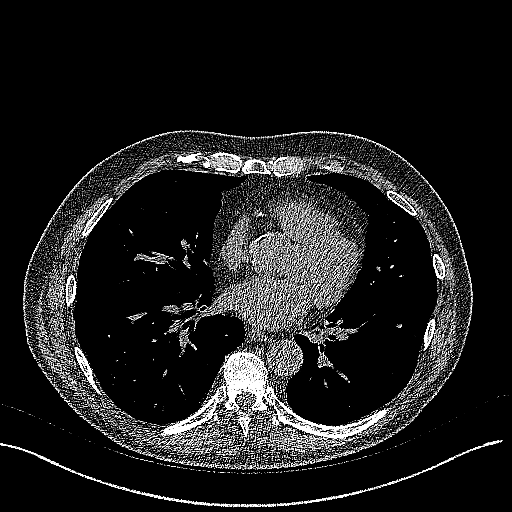
[im 127/309  lung]
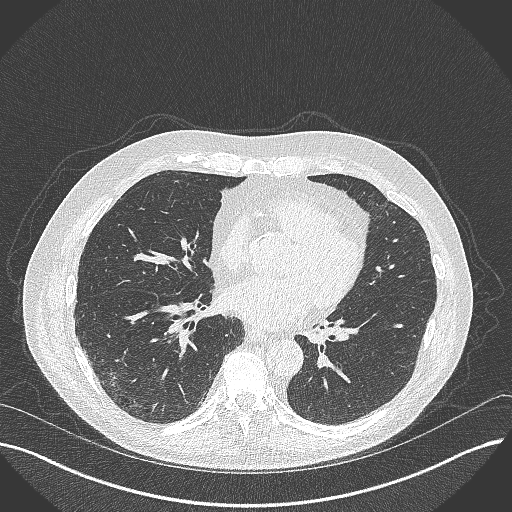
[im 155/309  lung]
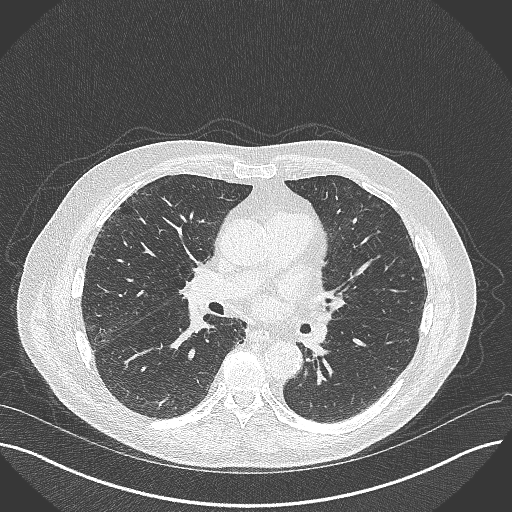
[im 183/309  lung]
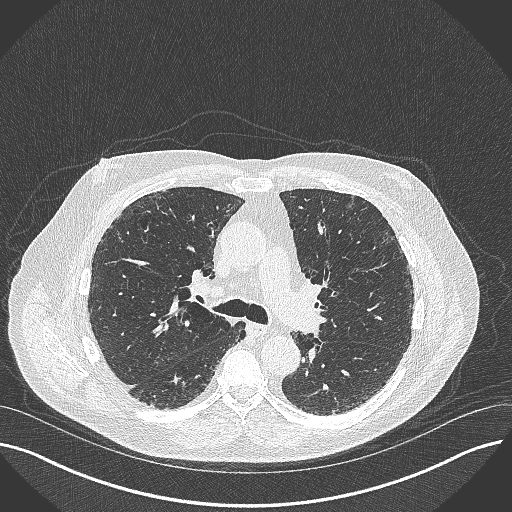
[im 211/309  lung]
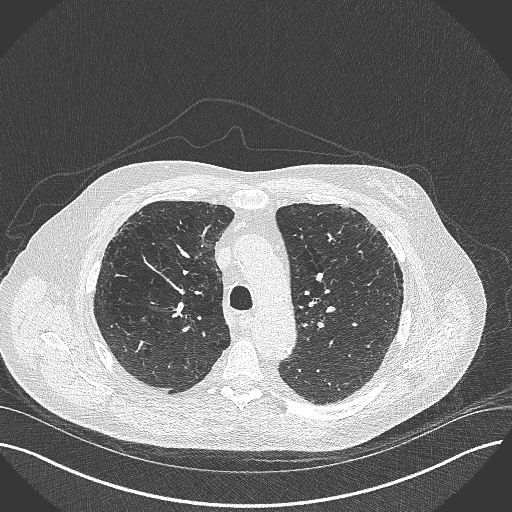
[im 239/309  mediastinal]
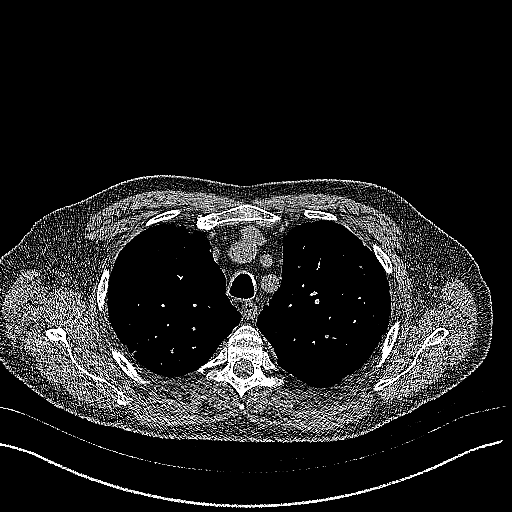
[im 239/309  lung]
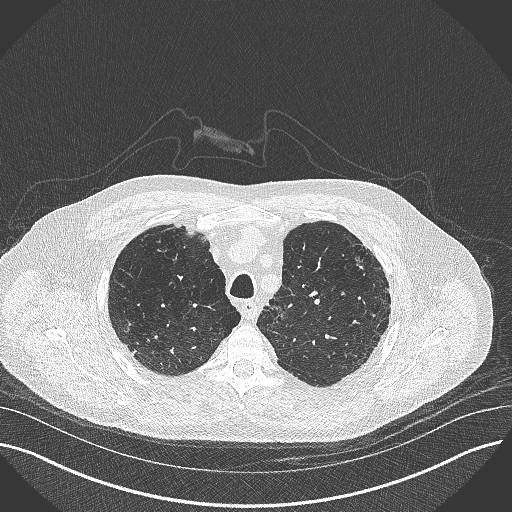
[im 267/309  lung]
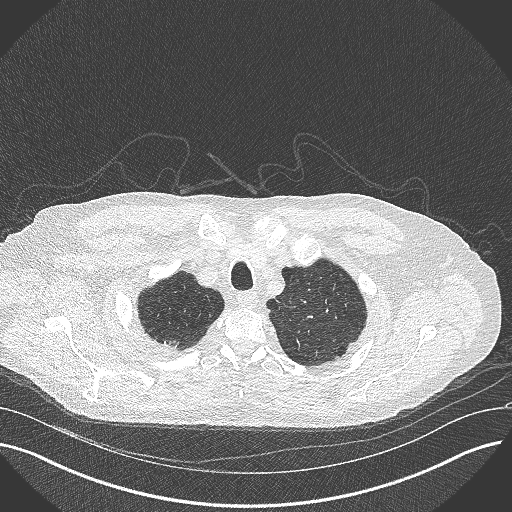
[im 295/309  lung]
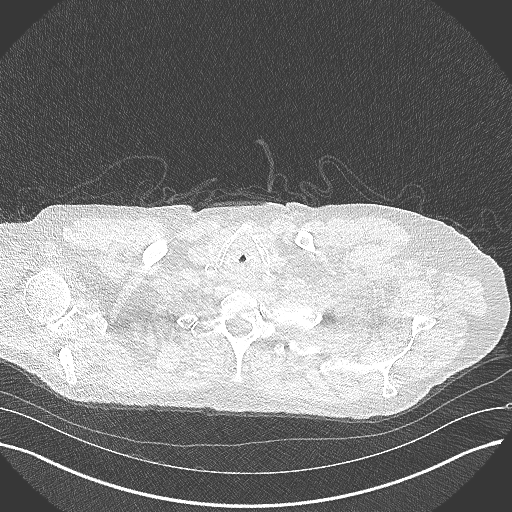

[Series 5: coronal · coronal · 0.61mm/px · 3 of 139 slices shown]
[im 28/139  lung]
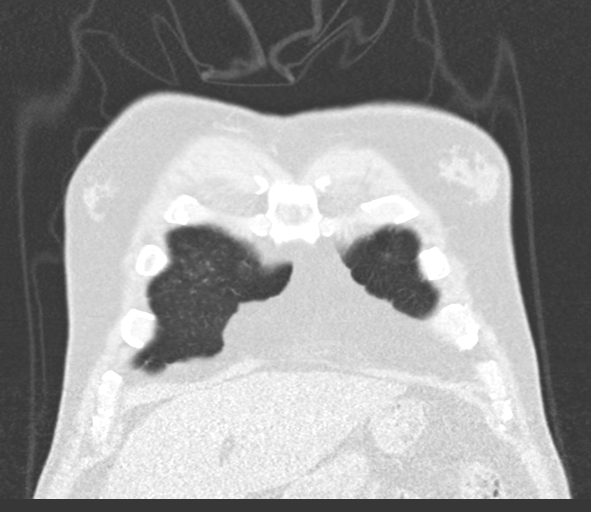
[im 56/139  lung]
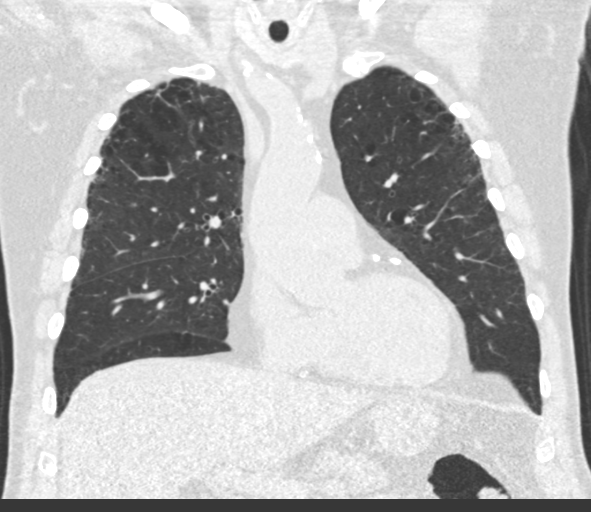
[im 83/139  lung]
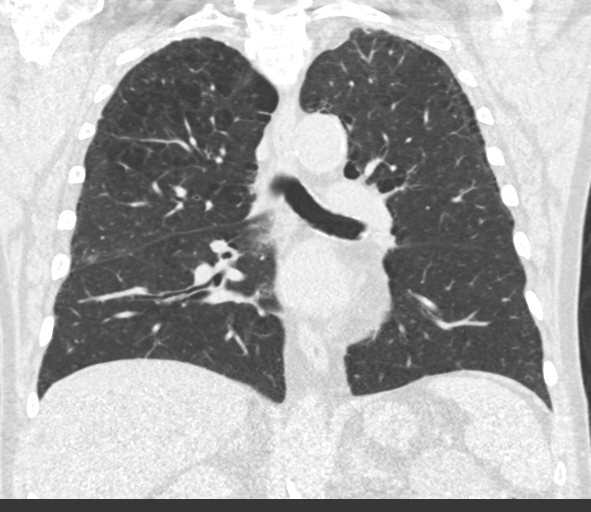

[14 of 36 positions shown; findings below may reference images not displayed]

FINDINGS: Cardiovascular: Heart size is normal. There is no significant
pericardial fluid, thickening or pericardial calcification. There is
aortic atherosclerosis, as well as atherosclerosis of the great
vessels of the mediastinum and the coronary arteries, including
calcified atherosclerotic plaque in the left main, left anterior
descending, left circumflex and right coronary arteries.

Mediastinum/Nodes: No pathologically enlarged mediastinal or hilar
lymph nodes. Please note that accurate exclusion of hilar adenopathy
is limited on noncontrast CT scans. Esophagus is unremarkable in
appearance. No axillary lymphadenopathy.

Lungs/Pleura: Multiple small pulmonary nodules are again noted in
the lungs bilaterally, largest of which is in the medial aspect of
the left upper lobe (axial image 159 of series 3), with a volume
derived mean diameter of 9.2 mm, similar to prior studies. No other
larger more suspicious appearing pulmonary nodules or masses are
noted. Diffuse bronchial wall thickening with moderate centrilobular
and paraseptal emphysema.

Upper Abdomen: Diffuse low attenuation throughout the visualized
hepatic parenchyma, indicative of hepatic steatosis. Multiple
low-attenuation lesions associated with the kidneys bilaterally,
incompletely characterized on today's non-contrast CT examination,
but similar to the prior study and statistically likely to represent
cysts, largest of which is exophytic in the upper pole the left
kidney measuring up to 6 cm.

Musculoskeletal: There are no aggressive appearing lytic or blastic
lesions noted in the visualized portions of the skeleton.
IMPRESSION: 1. Lung-RADS 2S, benign appearance or behavior. Continue annual
screening with low-dose chest CT without contrast in 12 months.
2. The "S" modifier above refers to potentially clinically
significant non lung cancer related findings. Specifically, there is
aortic atherosclerosis, in addition to left main and 3 vessel
coronary artery disease. Please note that although the presence of
coronary artery calcium documents the presence of coronary artery
disease, the severity of this disease and any potential stenosis
cannot be assessed on this non-gated CT examination. Assessment for
potential risk factor modification, dietary therapy or pharmacologic
therapy may be warranted, if clinically indicated.
3. Mild diffuse bronchial wall thickening with moderate
centrilobular and paraseptal emphysema; imaging findings suggestive
of underlying COPD.
4. Hepatic steatosis.

Aortic Atherosclerosis (M4EP7-GJL.L) and Emphysema (M4EP7-M1R.T).

## 2022-02-15 DIAGNOSIS — D225 Melanocytic nevi of trunk: Secondary | ICD-10-CM | POA: Diagnosis not present

## 2022-02-15 DIAGNOSIS — D2272 Melanocytic nevi of left lower limb, including hip: Secondary | ICD-10-CM | POA: Diagnosis not present

## 2022-02-15 DIAGNOSIS — L820 Inflamed seborrheic keratosis: Secondary | ICD-10-CM | POA: Diagnosis not present

## 2022-02-15 DIAGNOSIS — L821 Other seborrheic keratosis: Secondary | ICD-10-CM | POA: Diagnosis not present

## 2022-02-15 DIAGNOSIS — Z85828 Personal history of other malignant neoplasm of skin: Secondary | ICD-10-CM | POA: Diagnosis not present

## 2022-02-15 DIAGNOSIS — D2271 Melanocytic nevi of right lower limb, including hip: Secondary | ICD-10-CM | POA: Diagnosis not present

## 2022-02-15 DIAGNOSIS — D1801 Hemangioma of skin and subcutaneous tissue: Secondary | ICD-10-CM | POA: Diagnosis not present

## 2022-02-23 ENCOUNTER — Ambulatory Visit (INDEPENDENT_AMBULATORY_CARE_PROVIDER_SITE_OTHER): Payer: Medicare Other | Admitting: Adult Health

## 2022-02-23 ENCOUNTER — Encounter: Payer: Self-pay | Admitting: Adult Health

## 2022-02-23 VITALS — BP 138/72 | HR 56 | Temp 99.2°F | Ht 66.0 in | Wt 152.0 lb

## 2022-02-23 DIAGNOSIS — F458 Other somatoform disorders: Secondary | ICD-10-CM | POA: Diagnosis not present

## 2022-02-23 NOTE — Patient Instructions (Signed)
Famotidine (Pepcid AC, Pepcid Oral, Zantac 360) ?Cimetidine (Tagamet, Tagamet HB) ? ?If that doesn't work you can also try Lactaid  ? ? ?I would also recommend Flonase or Astepro for the nasal issue and also use a normal saline spray such as AYR  ?

## 2022-02-23 NOTE — Progress Notes (Signed)
? ?Subjective:  ? ? Patient ID: Jeffrey Hudson, male    DOB: 1951/06/24, 71 y.o.   MRN: [id number] ? ?HPI ? ?71 year old male who is being evaluated today for a chronic issue of bloating excessive belching and flatulence.  He has been seen by gastroenterology and had a colonoscopy and endoscopy done.  Has also been seen by his pulmonologist.  He has tried using Beano and simethicone without any improvement.  Feels as though his symptoms are getting worse.  He continues to smoke but has cut back to about 2 cigarettes a day.  He has tried to reduce the amount of gas producing foods.  Does not chew gum. ? ?He is wondering if there is anything else that can be done because this is starting to affect his life. ? ?Review of Systems ?See HPI  ? ?Past Medical History:  ?Diagnosis Date  ? AMI (acute myocardial infarction) ([place])   ? Acute ST elevation  ? Anxiety   ? Arthritis   ? neck  ? Bipolar disorder ([place])   ? COPD (chronic obstructive pulmonary disease) ([place])   ? Coronary artery disease   ? a. s/p anterolateral STEMI with BMS placed in large diagonal branch (2009).  ? Emphysema of lung ([place])   ? GERD (gastroesophageal reflux disease)   ? Glaucoma   ? Hx of adenomatous polyp of colon 05/06/2021  ? diminutive - no f/u needed  ? Hyperlipidemia   ? Hypertension   ? Nodule of left lung   ? 6-mm smooothly rounded nodule  ? Plantar fasciitis   ? left foot  ? Pre-diabetes   ? Syncope and collapse   ? in 2001 and 2004 with no clear cause  ? Tobacco abuse   ? Viral URI with cough 11/22/2016  ? ? ?Social History  ? ?Socioeconomic History  ? Marital status: Married  ?  Spouse name: Not on file  ? Number of children: 1  ? Years of education: Not on file  ? Highest education level: Bachelor's degree (e.g., BA, AB, BS)  ?Occupational History  ? Occupation: Retired-Sales/broker  ?Tobacco Use  ? Smoking status: Every Day  ?  Packs/day: 0.25  ?  Years: 45.00  ?  Pack years: 11.25  ?  Types: Cigarettes  ? Smokeless tobacco: Never  ?  Tobacco comments:  ?  Patient states he's down to smoking 5 cigarettes a day/night  ?Vaping Use  ? Vaping Use: Never used  ?Substance and Sexual Activity  ? Alcohol use: Yes  ?  Comment: 2 drinks daily  ? Drug use: No  ?  Comment: marijuana quit 2010. He has hx of THC use.  ? Sexual activity: Not on file  ?Other Topics Concern  ? Not on file  ?Social History Narrative  ? 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 He has been married for 35 years   ? One child (son)   ?   ? He likes to [employer] - plays once a week   ? 2 glasses of wine a day max, still smoking 2 cups of coffee a day no drug use or other tobacco  ? ?Social Determinants of Health  ? ?Financial Resource Strain: Medium Risk  ? Difficulty of Paying Living Expenses: Somewhat hard  ?Food Insecurity: No Food Insecurity  ? Worried About Charity fundraiser in the Last Year: Never true  ? Ran Out of Food in the Last Year: Never true  ?  Transportation Needs: No Transportation Needs  ? Lack of Transportation (Medical): No  ? Lack of Transportation (Non-Medical): No  ?Physical Activity: Inactive  ? Days of Exercise per Week: 2 days  ? Minutes of Exercise per Session: 0 min  ?Stress: No Stress Concern Present  ? Feeling of Stress : Not at all  ?Social Connections: Moderately Isolated  ? Frequency of Communication with Friends and Family: Once a week  ? Frequency of Social Gatherings with Friends and Family: Once a week  ? Attends Religious Services: More than 4 times per year  ? Active Member of Clubs or Organizations: No  ? Attends Archivist Meetings: Never  ? Marital Status: Married  ?Intimate Partner Violence: Not At Risk  ? Fear of Current or Ex-Partner: No  ? Emotionally Abused: No  ? Physically Abused: No  ? Sexually Abused: No  ? ? ?Past Surgical History:  ?Procedure Laterality Date  ? COLONOSCOPY    ? PERCUTANEOUS CORONARY STENT INTERVENTION (PCI-S)    ? UPPER GASTROINTESTINAL ENDOSCOPY    ? ? ?Family  History  ?Problem Relation Age of Onset  ? Dementia Mother 82  ?     died  ? Alcohol abuse Father 47  ? Depression Brother   ? Healthy Son   ? Colon cancer Neg Hx   ? Esophageal cancer Neg Hx   ? Rectal cancer Neg Hx   ? Stomach cancer Neg Hx   ? ? ?Allergies  ?Allergen Reactions  ? Lisinopril Swelling  ?  Angio edema   ? Metformin And Related Diarrhea  ? Norvasc [Amlodipine] Swelling  ?  Lower extremity   ? ? ?Current Outpatient Medications on File Prior to Visit  ?Medication Sig Dispense Refill  ? albuterol (PROAIR HFA) 108 (90 Base) MCG/ACT inhaler Inhale 2 puffs into the lungs every 6 (six) hours as needed. 1 Inhaler 3  ? amLODipine (NORVASC) 5 MG tablet Take by mouth.    ? aspirin 81 MG tablet Take 81 mg by mouth daily.    ? betamethasone dipropionate 0.05 % cream as needed.     ? cyclobenzaprine (FLEXERIL) 10 MG tablet Take by mouth.    ? doxazosin (CARDURA) 1 MG tablet Take 1 tablet (1 mg total) by mouth at bedtime. 90 tablet 3  ? ezetimibe (ZETIA) 10 MG tablet TAKE 1 TABLET BY MOUTH DAILY 30 tablet 11  ? FLUoxetine (PROZAC) 20 MG capsule Take 1 capsule (20 mg total) by mouth daily. 90 capsule 2  ? Glycopyrrolate-Formoterol (BEVESPI AEROSPHERE) 9-4.8 MCG/ACT AERO Inhale 2 puffs into the lungs 2 (two) times daily. 10.7 g 5  ? latanoprost (XALATAN) 0.005 % ophthalmic solution SMARTSIG:1 Drop(s) In Eye(s) Every Evening    ? meloxicam (MOBIC) 7.5 MG tablet Take 1 tablet (7.5 mg total) by mouth daily. 90 tablet 1  ? metoprolol succinate (TOPROL-XL) 25 MG 24 hr tablet TAKE 1 TABLET BY MOUTH ONCE  DAILY 30 tablet 11  ? Multiple Vitamin (MULTIVITAMIN) tablet Take 1 tablet by mouth daily.    ? nitroGLYCERIN (NITROSTAT) 0.4 MG SL tablet Place 1 tablet (0.4 mg total) under the tongue every 5 (five) minutes as needed for chest pain. 25 tablet 11  ? pantoprazole (PROTONIX) 40 MG tablet Take 1 tablet (40 mg total) by mouth daily before breakfast. 90 tablet 3  ? timolol (BETIMOL) 0.25 % ophthalmic solution Place 1-2 drops  into both eyes daily.    ? clobetasol ointment (TEMOVATE) 2.35 % Apply 1 application. topically 2 (two)  times daily.    ? hydrochlorothiazide (HYDRODIURIL) 25 MG tablet Take 1 tablet (25 mg total) by mouth daily. 90 tablet 3  ? rosuvastatin (CRESTOR) 40 MG tablet Take 1 tablet (40 mg total) by mouth daily. 90 tablet 3  ? ?No current facility-administered medications on file prior to visit.  ? ? ?BP 138/72   Pulse (!) 56   Temp 99.2 ?F (37.3 ?C) (Oral)   Ht '5\' 6"'$  (1.676 m)   Wt 152 lb (68.9 kg)   BMI 24.53 kg/m?  ? ? ?   ?Objective:  ? Physical Exam ?Vitals and nursing note reviewed.  ?Constitutional:   ?   Appearance: Normal appearance.  ?Abdominal:  ?   General: Abdomen is flat. Bowel sounds are normal.  ?   Palpations: Abdomen is soft.  ?Skin: ?   General: Skin is warm and dry.  ?Neurological:  ?   General: No focal deficit present.  ?   Mental Status: He is alert and oriented to person, place, and time.  ?Psychiatric:     ?   Mood and Affect: Mood normal.     ?   Behavior: Behavior normal.     ?   Thought Content: Thought content normal.  ? ?   ?Assessment & Plan:  ?1. Aerophagia ?-We will have him try an over-the-counter H2 blocker.  If that does not work after a couple weeks we can try taking Lactaid.  Can also try probiotics to see if that helps.  Advised patient that there was not much that can be done.  Encouraged to quit smoking completely ? ?Dorothyann Peng, NP ? ? ? ?

## 2022-03-03 ENCOUNTER — Ambulatory Visit: Payer: Medicare Other | Admitting: Physician Assistant

## 2022-03-17 DIAGNOSIS — M720 Palmar fascial fibromatosis [Dupuytren]: Secondary | ICD-10-CM | POA: Diagnosis not present

## 2022-03-17 DIAGNOSIS — M180 Bilateral primary osteoarthritis of first carpometacarpal joints: Secondary | ICD-10-CM | POA: Diagnosis not present

## 2022-04-01 ENCOUNTER — Ambulatory Visit: Payer: Medicare Other | Admitting: Rheumatology

## 2022-04-06 ENCOUNTER — Other Ambulatory Visit: Payer: Self-pay | Admitting: Adult Health

## 2022-04-06 DIAGNOSIS — H401131 Primary open-angle glaucoma, bilateral, mild stage: Secondary | ICD-10-CM | POA: Diagnosis not present

## 2022-04-28 ENCOUNTER — Ambulatory Visit: Payer: Medicare Other | Admitting: Podiatry

## 2022-04-28 ENCOUNTER — Encounter: Payer: Self-pay | Admitting: Podiatry

## 2022-04-28 DIAGNOSIS — B351 Tinea unguium: Secondary | ICD-10-CM

## 2022-04-28 DIAGNOSIS — M79676 Pain in unspecified toe(s): Secondary | ICD-10-CM

## 2022-04-28 NOTE — Progress Notes (Signed)
This patient returns to the office for evaluation and treatment of long thick painful nails .  This patient is unable to trim his own nails since the patient cannot reach the feet.  Patient says the nails are painful walking and wearing his shoes.  He returns for preventive foot care services.  General Appearance  Alert, conversant and in no acute stress.  Vascular  Dorsalis pedis and posterior tibial  pulses are palpable  bilaterally.  Capillary return is within normal limits  bilaterally. Temperature is within normal limits  bilaterally.  Neurologic  Senn-Weinstein monofilament wire test within normal limits  bilaterally. Muscle power within normal limits bilaterally.  Nails Thick disfigured discolored nails with subungual debris  from hallux to fifth toes bilaterally. No evidence of bacterial infection or drainage bilaterally.  Orthopedic  No limitations of motion  feet .  No crepitus or effusions noted.  No bony pathology or digital deformities noted.  Skin  normotropic skin with no porokeratosis noted bilaterally.  No signs of infections or ulcers noted.     Onychomycosis  Pain in toes right foot  Pain in toes left foot  Debridement  of nails  1-5  B/L with a nail nipper.  Nails were then filed using a dremel tool with no incidents.    RTC 3 months    Berk Pilot DPM  

## 2022-06-08 ENCOUNTER — Telehealth (HOSPITAL_COMMUNITY): Payer: Medicare Other | Admitting: Psychiatry

## 2022-06-08 DIAGNOSIS — F325 Major depressive disorder, single episode, in full remission: Secondary | ICD-10-CM | POA: Diagnosis not present

## 2022-06-08 MED ORDER — FLUOXETINE HCL 20 MG PO CAPS: 20.0000 mg | ORAL_CAPSULE | Freq: Every day | ORAL | 2 refills | Status: DC

## 2022-06-08 NOTE — Progress Notes (Addendum)
Psychiatric Initial Adult Assessment   Patient Identification: Jeffrey Hudson MRN:  [id number] Date of Evaluation:  06/08/2022 Referral Source: Dr. Zettie Pho Chief Complaint:   Visit Diagnosis: Bipolar disorder  History of Present Illness:     Today the patient is doing fairly well.  He is having some issues with his wife.  They both agreed to get into some type of marital treatment.  Today I gave her the name of 2 possible therapist for them.  He will reach out to them.  General the patient is sleeping and eating well.  He is not depressed on the basis on a daily basis.  He drinks no alcohol and uses no drugs.  He is still enjoying life but he has issues with his wife that will work well.  He continues taking his Prozac as prescribed and has a return appointment to see me in a few months.  The patient certainly is not suicidal and is functioning actually well.  (Hypo) Manic Symptoms:   Anxiety Symptoms:   Psychotic Symptoms:   PTSD Symptoms:   Past Psychiatric History: Zyprexa and Prozac, has been psychotherapy in the past including cognitive behavioral therapy.  Previous Psychotropic Medications: Zyprexa and Prozac  Substance Abuse History in the last 12 months:    Consequences of Substance Abuse:   Past Medical History:  Past Medical History:  Diagnosis Date   AMI (acute myocardial infarction) ([place])    Acute ST elevation   Anxiety    Arthritis    neck   Bipolar disorder ([place])    COPD (chronic obstructive pulmonary disease) ([place])    Coronary artery disease    a. s/p anterolateral STEMI with BMS placed in large diagonal branch (2009).   Emphysema of lung ([place])    GERD (gastroesophageal reflux disease)    Glaucoma    Hx of adenomatous polyp of colon 05/06/2021   diminutive - no f/u needed   Hyperlipidemia    Hypertension    Nodule of left lung    6-mm smooothly rounded nodule   Plantar fasciitis    left foot   Pre-diabetes    Syncope and collapse    in 2001  and 2004 with no clear cause   Tobacco abuse    Viral URI with cough 11/22/2016    Past Surgical History:  Procedure Laterality Date   COLONOSCOPY     PERCUTANEOUS CORONARY STENT INTERVENTION (PCI-S)     UPPER GASTROINTESTINAL ENDOSCOPY      Family Psychiatric History:   Family History:  Family History  Problem Relation Age of Onset   Dementia Mother 35       died   Alcohol abuse Father 77   Depression Brother    Healthy Son    Colon cancer Neg Hx    Esophageal cancer Neg Hx    Rectal cancer Neg Hx    Stomach cancer Neg Hx     Social History:   Social History   Socioeconomic History   Marital status: Married    Spouse name: Not on file   Number of children: 1   Years of education: Not on file   Highest education level: Bachelor's degree (e.g., BA, AB, BS)  Occupational History   Occupation: Retired-Sales/broker  Tobacco Use   Smoking status: Every Day    Packs/day: 0.25    Years: 45.00    Total pack years: 11.25    Types: Cigarettes   Smokeless tobacco: Never   Tobacco comments:    Patient  states he's down to smoking 5 cigarettes a day/night  Vaping Use   Vaping Use: Never used  Substance and Sexual Activity   Alcohol use: Yes    Comment: 2 drinks daily   Drug use: No    Comment: marijuana quit 2010. He has hx of THC use.   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 - plays once a week    2 glasses of wine a day max, still smoking 2 cups of coffee a day no drug use or other tobacco   Social Determinants of Health   Financial Resource Strain: Medium Risk (12/01/2021)   Overall Financial Resource Strain (CARDIA)    Difficulty of Paying Living Expenses: Somewhat hard  Food Insecurity: No Food Insecurity (12/01/2021)   Hunger Vital Sign     Worried About Running Out of Food in the Last Year: Never true    [place] in the Last Year: Never true  Transportation Needs: No Transportation Needs (12/01/2021)   PRAPARE - Hydrologist (Medical): No    Lack of Transportation (Non-Medical): No  Physical Activity: Inactive (12/01/2021)   Exercise Vital Sign    Days of Exercise per Week: 2 days    Minutes of Exercise per Session: 0 min  Stress: No Stress Concern Present (12/01/2021)   [place]    Feeling of Stress : Not at all  Social Connections: Moderately Isolated (12/01/2021)   Social Connection and Isolation Panel [NHANES]    Frequency of Communication with Friends and Family: Once a week    Frequency of Social Gatherings with Friends and Family: Once a week    Attends Religious Services: More than 4 times per year    Active Member of [employer] or Organizations: No    Attends Music therapist: Not on file    Marital Status: Married    Additional Social History:   Allergies:   Allergies  Allergen Reactions   Lisinopril Swelling    Angio edema    Metformin And Related Diarrhea   Norvasc [Amlodipine] Swelling    Lower extremity     Metabolic Disorder Labs: Lab Results  Component Value Date   HGBA1C 5.9 07/28/2021   MPG 131 06/24/2009   No results found for: "PROLACTIN" Lab Results  Component Value Date   CHOL 165 10/30/2021   TRIG 89 10/30/2021   HDL 74 10/30/2021   CHOLHDL 2.2 10/30/2021   VLDL 32.0 01/15/2021   LDLCALC 75 10/30/2021   LDLCALC 77 01/15/2021   Lab Results  Component Value Date   TSH 3.40 01/15/2021    Therapeutic Level Labs: No results found for: "LITHIUM" No results found for: "CBMZ" No results found for: "VALPROATE"  Current Medications: Current Outpatient Medications  Medication Sig Dispense Refill   albuterol (PROAIR HFA) 108 (90 Base) MCG/ACT inhaler Inhale 2 puffs into  the lungs every 6 (six) hours as needed. 1 Inhaler 3   amLODipine (NORVASC) 5 MG tablet Take by mouth.     aspirin 81 MG tablet Take 81 mg by mouth daily.     betamethasone dipropionate 0.05 % cream as needed.  clobetasol ointment (TEMOVATE) 9.51 % Apply 1 application. topically 2 (two) times daily.     cyclobenzaprine (FLEXERIL) 10 MG tablet Take by mouth.     doxazosin (CARDURA) 1 MG tablet Take 1 tablet (1 mg total) by mouth at bedtime. 90 tablet 3   ezetimibe (ZETIA) 10 MG tablet TAKE 1 TABLET BY MOUTH DAILY 30 tablet 11   FLUoxetine (PROZAC) 20 MG capsule Take 1 capsule (20 mg total) by mouth daily. 90 capsule 2   Glycopyrrolate-Formoterol (BEVESPI AEROSPHERE) 9-4.8 MCG/ACT AERO Inhale 2 puffs into the lungs 2 (two) times daily. 10.7 g 5   hydrochlorothiazide (HYDRODIURIL) 25 MG tablet Take 1 tablet (25 mg total) by mouth daily. 90 tablet 3   latanoprost (XALATAN) 0.005 % ophthalmic solution SMARTSIG:1 Drop(s) In Eye(s) Every Evening     meloxicam (MOBIC) 7.5 MG tablet TAKE 1 TABLET BY MOUTH  DAILY 90 tablet 3   metoprolol succinate (TOPROL-XL) 25 MG 24 hr tablet TAKE 1 TABLET BY MOUTH ONCE  DAILY 30 tablet 11   Multiple Vitamin (MULTIVITAMIN) tablet Take 1 tablet by mouth daily.     nitroGLYCERIN (NITROSTAT) 0.4 MG SL tablet Place 1 tablet (0.4 mg total) under the tongue every 5 (five) minutes as needed for chest pain. 25 tablet 11   pantoprazole (PROTONIX) 40 MG tablet Take 1 tablet (40 mg total) by mouth daily before breakfast. 90 tablet 3   rosuvastatin (CRESTOR) 40 MG tablet Take 1 tablet (40 mg total) by mouth daily. 90 tablet 3   timolol (BETIMOL) 0.25 % ophthalmic solution Place 1-2 drops into both eyes daily.     No current facility-administered medications for this visit.    Musculoskeletal: Strength & Muscle Tone: within normal limits Gait & Station: normal Patient leans: N/A  Psychiatric Specialty Exam: ROS  There were no vitals taken for this visit.There is no  height or weight on file to calculate BMI.  General Appearance: Casual  Eye Contact:  Good  Speech:  Clear and Coherent  Volume:  Normal  Mood:  Negative  Affect:  Appropriate  Thought Process:  Goal Directed  Orientation:  Full (Time, Place, and Person)  Thought Content:  WDL  Suicidal Thoughts:  No  Homicidal Thoughts:  No  Memory:  NA  Judgement:  Good  Insight:  NA and Good  Psychomotor Activity:  Normal  Concentration:    Recall:  Good  Fund of Knowledge:Good  Language: Good  Akathisia:  No  Handed:  Right  AIMS (if indicated):  not done  Assets:  Desire for Improvement  ADL's:  Intact  Cognition: WNL  Sleep:  Good   Screenings: AUDIT    Flowsheet Row Office Visit from 12/04/2021 in [place] at [place]  Alcohol Use Disorder Identification Test Final Score (AUDIT) 4      PHQ2-9    Flowsheet Row Clinical Support from 06/02/2021 in [place] at [facility] from 03/11/2021 in [place] at [place] from 02/04/2015 in [facility] at [facility]  PHQ-2 Total Score 0 2 0  PHQ-9 Total Score -- 2 --       Assessment and Plan:   8/22/20232:46 PM    Today the patient is doing very well.  He will continue taking Prozac.  His diagnosis is major clinical depression.  He is interested in marital treatment and was given some names today.  Will be seen again in the next few months.  He is very stable at this time. By Phone  30 minutes Patient at home called from center. Time spent  30 minutes  patient at home  I called from the center

## 2022-06-22 ENCOUNTER — Ambulatory Visit: Payer: Medicare Other

## 2022-06-24 ENCOUNTER — Telehealth: Payer: Self-pay | Admitting: Adult Health

## 2022-06-24 NOTE — Telephone Encounter (Signed)
Left message for patient to call back and schedule Medicare Annual Wellness Visit (AWV) either virtually or in office. Left  my Jeffrey Hudson number [phone]   Last AWV  06/02/21  please schedule at anytime with [facility] Nurse Health Advisor 1 or 2

## 2022-06-26 ENCOUNTER — Other Ambulatory Visit: Payer: Self-pay | Admitting: Internal Medicine

## 2022-06-28 DIAGNOSIS — L820 Inflamed seborrheic keratosis: Secondary | ICD-10-CM | POA: Diagnosis not present

## 2022-06-28 DIAGNOSIS — L821 Other seborrheic keratosis: Secondary | ICD-10-CM | POA: Diagnosis not present

## 2022-06-28 DIAGNOSIS — L738 Other specified follicular disorders: Secondary | ICD-10-CM | POA: Diagnosis not present

## 2022-06-28 DIAGNOSIS — Z85828 Personal history of other malignant neoplasm of skin: Secondary | ICD-10-CM | POA: Diagnosis not present

## 2022-06-29 ENCOUNTER — Telehealth: Payer: Self-pay

## 2022-06-29 NOTE — Telephone Encounter (Signed)
Unsuccessful attempt to reach patient on preferred number listed in notes for scheduled AWV. Left message on voicemail okay to reschedule. 

## 2022-07-06 ENCOUNTER — Ambulatory Visit (INDEPENDENT_AMBULATORY_CARE_PROVIDER_SITE_OTHER): Payer: Medicare Other

## 2022-07-06 VITALS — Ht 66.0 in | Wt 160.0 lb

## 2022-07-06 DIAGNOSIS — Z0000 Encounter for general adult medical examination without abnormal findings: Secondary | ICD-10-CM | POA: Diagnosis not present

## 2022-07-06 NOTE — Progress Notes (Signed)
I connected with Jeffrey Hudson today by telephone and verified that I am speaking with the correct person using two identifiers. Location patient: home Location provider: work Persons participating in the virtual visit: Jeffrey Hudson, Glenna Durand LPN.   I discussed the limitations, risks, security and privacy concerns of performing an evaluation and management service by telephone and the availability of in person appointments. I also discussed with the patient that there may be a patient responsible charge related to this service. The patient expressed understanding and verbally consented to this telephonic visit.    Interactive audio and video telecommunications were attempted between this provider and patient, however failed, due to patient having technical difficulties OR patient did not have access to video capability.  We continued and completed visit with audio only.     Vital signs may be patient reported or missing.  Subjective:   Jeffrey Hudson is a 71 y.o. male who presents for Medicare Annual/Subsequent preventive examination.  Review of Systems     Cardiac Risk Factors include: advanced age (>23mn, >>41women);smoking/ tobacco exposure;dyslipidemia;hypertension;male gender     Objective:    Today's Vitals   07/06/22 1126  Weight: 160 lb (72.6 kg)  Height: '5\' 6"'$  (1.676 m)   Body mass index is 25.82 kg/m.     07/06/2022   11:30 AM 06/02/2021   10:29 AM 01/21/2021   10:14 AM  Advanced Directives  Does Patient Have a Medical Advance Directive? Yes Yes Yes  Type of [place] Living will   Copy of [place]? No - copy requested      Current Medications (verified) Outpatient Encounter Medications as of 07/06/2022  Medication Sig   albuterol (PROAIR HFA) 108 (90 Base) MCG/ACT inhaler Inhale 2 puffs into the lungs every 6 (six) hours as needed.   amLODipine (NORVASC) 5 MG tablet Take  by mouth.   aspirin 81 MG tablet Take 81 mg by mouth daily.   betamethasone dipropionate 0.05 % cream as needed.    clobetasol ointment (TEMOVATE) 01.63% Apply 1 application. topically 2 (two) times daily.   cyclobenzaprine (FLEXERIL) 10 MG tablet Take by mouth.   doxazosin (CARDURA) 1 MG tablet Take 1 tablet (1 mg total) by mouth at bedtime.   ezetimibe (ZETIA) 10 MG tablet TAKE 1 TABLET BY MOUTH DAILY   FLUoxetine (PROZAC) 20 MG capsule Take 1 capsule (20 mg total) by mouth daily.   Glycopyrrolate-Formoterol (BEVESPI AEROSPHERE) 9-4.8 MCG/ACT AERO Inhale 2 puffs into the lungs 2 (two) times daily.   latanoprost (XALATAN) 0.005 % ophthalmic solution SMARTSIG:1 Drop(s) In Eye(s) Every Evening   meloxicam (MOBIC) 7.5 MG tablet TAKE 1 TABLET BY MOUTH  DAILY   metoprolol succinate (TOPROL-XL) 25 MG 24 hr tablet TAKE 1 TABLET BY MOUTH ONCE  DAILY   Multiple Vitamin (MULTIVITAMIN) tablet Take 1 tablet by mouth daily.   nitroGLYCERIN (NITROSTAT) 0.4 MG SL tablet Place 1 tablet (0.4 mg total) under the tongue every 5 (five) minutes as needed for chest pain.   pantoprazole (PROTONIX) 40 MG tablet TAKE 1 TABLET BY MOUTH  DAILY BEFORE BREAKFAST   timolol (BETIMOL) 0.25 % ophthalmic solution Place 1-2 drops into both eyes daily.   hydrochlorothiazide (HYDRODIURIL) 25 MG tablet Take 1 tablet (25 mg total) by mouth daily.   rosuvastatin (CRESTOR) 40 MG tablet Take 1 tablet (40 mg total) by mouth daily.   No facility-administered encounter medications on file as of 07/06/2022.  Allergies (verified) Lisinopril, Metformin and related, and Norvasc [amlodipine]   History: Past Medical History:  Diagnosis Date   AMI (acute myocardial infarction) ([place])    Acute ST elevation   Anxiety    Arthritis    neck   Bipolar disorder ([facility])    COPD (chronic obstructive pulmonary disease) ([facility])    Coronary artery disease    a. s/p anterolateral STEMI with BMS placed in large diagonal branch (2009).   Emphysema  of lung ([place])    GERD (gastroesophageal reflux disease)    Glaucoma    Hx of adenomatous polyp of colon 05/06/2021   diminutive - no f/u needed   Hyperlipidemia    Hypertension    Nodule of left lung    6-mm smooothly rounded nodule   Plantar fasciitis    left foot   Pre-diabetes    Syncope and collapse    in 2001 and 2004 with no clear cause   Tobacco abuse    Viral URI with cough 11/22/2016   Past Surgical History:  Procedure Laterality Date   COLONOSCOPY     PERCUTANEOUS CORONARY STENT INTERVENTION (PCI-S)     UPPER GASTROINTESTINAL ENDOSCOPY     Family History  Problem Relation Age of Onset   Dementia Mother 62       died   Alcohol abuse Father 6   Depression Brother    Healthy Son    Colon cancer Neg Hx    Esophageal cancer Neg Hx    Rectal cancer Neg Hx    Stomach cancer Neg Hx    Social History   Socioeconomic History   Marital status: Married    Spouse name: Not on file   Number of children: 1   Years of education: Not on file   Highest education level: Bachelor's degree (e.g., BA, AB, BS)  Occupational History   Occupation: Retired-Sales/broker  Tobacco Use   Smoking status: Every Day    Packs/day: 0.25    Years: 45.00    Total pack years: 11.25    Types: Cigarettes   Smokeless tobacco: Never   Tobacco comments:    Patient states he's down to smoking 3 cigarettes a day/night  Vaping Use   Vaping Use: Never used  Substance and Sexual Activity   Alcohol use: Yes    Comment: 2 drinks daily   Drug use: No    Comment: marijuana quit 2010. He has hx of THC use.   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 - plays once a week    2 glasses of wine a day max, still smoking 2 cups of coffee a day no drug use or other tobacco   Social  Determinants of Health   Financial Resource Strain: Low Risk  (07/06/2022)   Overall Financial Resource Strain (CARDIA)    Difficulty of Paying Living Expenses: Not hard at all  Food Insecurity: No Food Insecurity (07/06/2022)   Hunger Vital Sign    Worried About Running Out of Food in the Last Year: Never true    [place] in the Last Year: Never true  Transportation Needs: No Transportation Needs (07/06/2022)   PRAPARE - Transportation    Lack of  Transportation (Medical): No    Lack of Transportation (Non-Medical): No  Physical Activity: Inactive (07/06/2022)   Exercise Vital Sign    Days of Exercise per Week: 0 days    Minutes of Exercise per Session: 0 min  Stress: No Stress Concern Present (07/06/2022)   [place]    Feeling of Stress : Not at all  Social Connections: Moderately Isolated (12/01/2021)   Social Connection and Isolation Panel [NHANES]    Frequency of Communication with Friends and Family: Once a week    Frequency of Social Gatherings with Friends and Family: Once a week    Attends Religious Services: More than 4 times per year    Active Member of [employer] or Organizations: No    Attends Music therapist: Not on file    Marital Status: Married    Tobacco Counseling Ready to quit: Yes Counseling given: Not Answered Tobacco comments: Patient states he's down to smoking 3 cigarettes a day/night   Clinical Intake:  Pre-visit preparation completed: Yes  Pain : No/denies pain     Nutritional Status: BMI 25 -29 Overweight Nutritional Risks: None Diabetes: No  How often do you need to have someone help you when you read instructions, pamphlets, or other written materials from your doctor or pharmacy?: 1 - Never  Diabetic? no  Interpreter Needed?: No  Information entered by :: NAllen LPN   Activities of Daily Living    07/06/2022   11:31 AM 06/27/2022    2:25 PM  In your  present state of health, do you have any difficulty performing the following activities:  Hearing? 0 0  Vision? 0 0  Difficulty concentrating or making decisions? 0 0  Walking or climbing stairs? 0 0  Dressing or bathing? 0 0  Doing errands, shopping? 0 0  Preparing Food and eating ? N N  Using the Toilet? N N  In the past six months, have you accidently leaked urine? N N  Do you have problems with loss of bowel control? N   Managing your Medications? N N  Managing your Finances? N N  Housekeeping or managing your Housekeeping? N N    Patient Care Team: Dorothyann Peng, NP as PCP - General (Family Medicine) Burnell Blanks, MD as PCP - Cardiology (Cardiology)  Indicate any recent Medical Services you may have received from other than [facility] providers in the past year (date may be approximate).     Assessment:   This is a routine wellness examination for K[place].  Hearing/Vision screen Vision Screening - Comments:: Regular eye exams, Dr. Alois Cliche  Dietary issues and exercise activities discussed: Current Exercise Habits: The patient does not participate in regular exercise at present   Goals Addressed             This Visit's Progress    Patient Stated       07/06/2022, no goals       Depression Screen    07/06/2022   11:31 AM 06/02/2021   10:28 AM 03/11/2021    9:03 AM 02/04/2015    9:35 AM  PHQ 2/9 Scores  PHQ - 2 Score 0 0 2 0  PHQ- 9 Score   2     Fall Risk    07/06/2022   11:31 AM 06/27/2022    2:25 PM 12/01/2021    3:12 AM 06/02/2021   10:29 AM 01/15/2021    8:58 AM  Fall Risk   Falls  in the past year? 0 0 1 0 0  Number falls in past yr: 0  0 0 0  Injury with Fall? 0  1 0   Risk for fall due to : Medication side effect   Impaired vision   Follow up Falls prevention discussed;Education provided;Falls evaluation completed   Falls prevention discussed     FALL RISK PREVENTION PERTAINING TO THE HOME:  Any stairs in or around the home? Yes  If  so, are there any without handrails? No  Home free of loose throw rugs in walkways, pet beds, electrical cords, etc? Yes  Adequate lighting in your home to reduce risk of falls? Yes   ASSISTIVE DEVICES UTILIZED TO PREVENT FALLS:  Life alert? No  Use of a cane, walker or w/c? No  Grab bars in the bathroom? No  Shower chair or bench in shower? No  Elevated toilet seat or a handicapped toilet? No   TIMED UP AND GO:  Was the test performed? No .      Cognitive Function:        07/06/2022   11:32 AM 06/02/2021   10:31 AM  6CIT Screen  What Year? 0 points 0 points  What month? 0 points 0 points  What time? 0 points 0 points  Count back from 20 0 points 0 points  Months in reverse 4 points 0 points  Repeat phrase 0 points 0 points  Total Score 4 points 0 points    Immunizations Immunization History  Administered Date(s) Administered   Fluad Quad(high Dose 65+) 06/21/2019   Influenza, High Dose Seasonal PF 08/06/2016, 08/23/2017, 06/29/2018, 06/21/2019   Influenza,inj,Quad PF,6+ Mos 08/03/2013, 09/21/2014, 07/28/2021   Influenza-Unspecified 11/03/2014, 09/07/2017, 07/18/2020   PFIZER Comirnaty(Gray Top)Covid-19 Tri-Sucrose Vaccine 01/31/2021   PFIZER(Purple Top)SARS-COV-2 Vaccination 12/02/2019, 12/25/2019, 07/18/2020   Pneumococcal Conjugate-13 02/04/2015   Pneumococcal Polysaccharide-23 08/06/2016   Td 02/04/2015   Zoster Recombinat (Shingrix) 06/21/2019, 10/05/2019   Zoster, Live 04/04/2015    TDAP status: Up to date  Flu Vaccine status: Due, Education has been provided regarding the importance of this vaccine. Advised may receive this vaccine at local pharmacy or Health Dept. Aware to provide a copy of the vaccination record if obtained from local pharmacy or Health Dept. Verbalized acceptance and understanding.  Pneumococcal vaccine status: Up to date  Covid-19 vaccine status: Completed vaccines  Qualifies for Shingles Vaccine? Yes   Zostavax completed Yes    Shingrix Completed?: Yes  Screening Tests Health Maintenance  Topic Date Due   COVID-19 Vaccine (5 - Pfizer series) 03/28/2021   INFLUENZA VACCINE  05/18/2022   TETANUS/TDAP  02/03/2025   Pneumonia Vaccine 63+ Years old  Completed   Hepatitis C Screening  Completed   Zoster Vaccines- Shingrix  Completed   HPV VACCINES  Aged Out   COLONOSCOPY (Pts 45-64yr Insurance coverage will need to be confirmed)  [place]aintenance Due  Topic Date Due   COVID-19 Vaccine (5 - Pfizer series) 03/28/2021   INFLUENZA VACCINE  05/18/2022    Colorectal cancer screening: No longer required.   Lung Cancer Screening: (Low Dose CT Chest recommended if Age 73-80years, 30 pack-year currently smoking OR have quit w/in 15years.) does not qualify.   Lung Cancer Screening Referral: no  Additional Screening:  Hepatitis C Screening: does qualify; Completed 09/02/2015  Vision Screening: Recommended annual ophthalmology exams for early detection of glaucoma and other disorders of the eye. Is the patient up to date with  their annual eye exam?  Yes  Who is the provider or what is the name of the office in which the patient attends annual eye exams? Dr. Idolina Primer If pt is not established with a provider, would they like to be referred to a provider to establish care? No .   Dental Screening: Recommended annual dental exams for proper oral hygiene  Community Resource Referral / Chronic Care Management: CRR required this visit?  No   CCM required this visit?  No      Plan:     I have personally reviewed and noted the following in the patient's chart:   Medical and social history Use of alcohol, tobacco or illicit drugs  Current medications and supplements including opioid prescriptions. Patient is not currently taking opioid prescriptions. Functional ability and status Nutritional status Physical activity Advanced directives List of other  physicians Hospitalizations, surgeries, and ER visits in previous 12 months Vitals Screenings to include cognitive, depression, and falls Referrals and appointments  In addition, I have reviewed and discussed with patient certain preventive protocols, quality metrics, and best practice recommendations. A written personalized care plan for preventive services as well as general preventive health recommendations were provided to patient.     Kellie Simmering, LPN   [id number]   Nurse Notes: none  Due to this being a virtual visit, the after visit summary with patients personalized plan was offered to patient via mail or my-chart.  Patient would like to access on my-chart

## 2022-07-06 NOTE — Patient Instructions (Signed)
Jeffrey Hudson , Thank you for taking time to come for your Medicare Wellness Visit. I appreciate your ongoing commitment to your health goals. Please review the following plan we discussed and let me know if I can assist you in the future.   Screening recommendations/referrals: Colonoscopy: not required Recommended yearly ophthalmology/optometry visit for glaucoma screening and checkup Recommended yearly dental visit for hygiene and checkup  Vaccinations: Influenza vaccine: due Pneumococcal vaccine: completed 08/06/2016 Tdap vaccine: completed 02/04/2015, due 02/03/2025 Shingles vaccine: completed   Covid-19:  01/31/2021, 07/18/2020, 12/25/2019, 12/02/2019  Advanced directives: Please bring a copy of your POA (Power of Attorney) and/or Living Will to your next appointment.   Conditions/risks identified: smoking  Next appointment: Follow up in one year for your annual wellness visit.   Preventive Care 73 Years and Older, Male Preventive care refers to lifestyle choices and visits with your health care provider that can promote health and wellness. What does preventive care include? A yearly physical exam. This is also called an annual well check. Dental exams once or twice a year. Routine eye exams. Ask your health care provider how often you should have your eyes checked. Personal lifestyle choices, including: Daily care of your teeth and gums. Regular physical activity. Eating a healthy diet. Avoiding tobacco and drug use. Limiting alcohol use. Practicing safe sex. Taking low doses of aspirin every day. Taking vitamin and mineral supplements as recommended by your health care provider. What happens during an annual well check? The services and screenings done by your health care provider during your annual well check will depend on your age, overall health, lifestyle risk factors, and family history of disease. Counseling  Your health care provider may ask you questions about  your: Alcohol use. Tobacco use. Drug use. Emotional well-being. Home and relationship well-being. Sexual activity. Eating habits. History of falls. Memory and ability to understand (cognition). Work and work Statistician. Screening  You may have the following tests or measurements: Height, weight, and BMI. Blood pressure. Lipid and cholesterol levels. These may be checked every 5 years, or more frequently if you are over 89 years old. Skin check. Lung cancer screening. You may have this screening every year starting at age 37 if you have a 30-pack-year history of smoking and currently smoke or have quit within the past 15 years. Fecal occult blood test (FOBT) of the stool. You may have this test every year starting at age 20. Flexible sigmoidoscopy or colonoscopy. You may have a sigmoidoscopy every 5 years or a colonoscopy every 10 years starting at age 41. Prostate cancer screening. Recommendations will vary depending on your family history and other risks. Hepatitis C blood test. Hepatitis B blood test. Sexually transmitted disease (STD) testing. Diabetes screening. This is done by checking your blood sugar (glucose) after you have not eaten for a while (fasting). You may have this done every 1-3 years. Abdominal aortic aneurysm (AAA) screening. You may need this if you are a current or former smoker. Osteoporosis. You may be screened starting at age 38 if you are at high risk. Talk with your health care provider about your test results, treatment options, and if necessary, the need for more tests. Vaccines  Your health care provider may recommend certain vaccines, such as: Influenza vaccine. This is recommended every year. Tetanus, diphtheria, and acellular pertussis (Tdap, Td) vaccine. You may need a Td booster every 10 years. Zoster vaccine. You may need this after age 67. Pneumococcal 13-valent conjugate (PCV13) vaccine. One dose is recommended  after age 21. Pneumococcal  polysaccharide (PPSV23) vaccine. One dose is recommended after age 42. Talk to your health care provider about which screenings and vaccines you need and how often you need them. This information is not intended to replace advice given to you by your health care provider. Make sure you discuss any questions you have with your health care provider. Document Released: 10/31/2015 Document Revised: 06/23/2016 Document Reviewed: 08/05/2015 Elsevier Interactive Patient Education  2017 [place] Prevention in the Home Falls can cause injuries. They can happen to people of all ages. There are many things you can do to make your home safe and to help prevent falls. What can I do on the outside of my home? Regularly fix the edges of walkways and driveways and fix any cracks. Remove anything that might make you trip as you walk through a door, such as a raised step or threshold. Trim any bushes or trees on the path to your home. Use bright outdoor lighting. Clear any walking paths of anything that might make someone trip, such as rocks or tools. Regularly check to see if handrails are loose or broken. Make sure that both sides of any steps have handrails. Any raised decks and porches should have guardrails on the edges. Have any leaves, snow, or ice cleared regularly. Use sand or salt on walking paths during winter. Clean up any spills in your garage right away. This includes oil or grease spills. What can I do in the bathroom? Use night lights. Install grab bars by the toilet and in the tub and shower. Do not use towel bars as grab bars. Use non-skid mats or decals in the tub or shower. If you need to sit down in the shower, use a plastic, non-slip stool. Keep the floor dry. Clean up any water that spills on the floor as soon as it happens. Remove soap buildup in the tub or shower regularly. Attach bath mats securely with double-sided non-slip rug tape. Do not have throw rugs and other  things on the floor that can make you trip. What can I do in the bedroom? Use night lights. Make sure that you have a light by your bed that is easy to reach. Do not use any sheets or blankets that are too big for your bed. They should not hang down onto the floor. Have a firm chair that has side arms. You can use this for support while you get dressed. Do not have throw rugs and other things on the floor that can make you trip. What can I do in the kitchen? Clean up any spills right away. Avoid walking on wet floors. Keep items that you use a lot in easy-to-reach places. If you need to reach something above you, use a strong step stool that has a grab bar. Keep electrical cords out of the way. Do not use floor polish or wax that makes floors slippery. If you must use wax, use non-skid floor wax. Do not have throw rugs and other things on the floor that can make you trip. What can I do with my stairs? Do not leave any items on the stairs. Make sure that there are handrails on both sides of the stairs and use them. Fix handrails that are broken or loose. Make sure that handrails are as long as the stairways. Check any carpeting to make sure that it is firmly attached to the stairs. Fix any carpet that is loose or worn. Avoid having throw  rugs at the top or bottom of the stairs. If you do have throw rugs, attach them to the floor with carpet tape. Make sure that you have a light switch at the top of the stairs and the bottom of the stairs. If you do not have them, ask someone to add them for you. What else can I do to help prevent falls? Wear shoes that: Do not have high heels. Have rubber bottoms. Are comfortable and fit you well. Are closed at the toe. Do not wear sandals. If you use a stepladder: Make sure that it is fully opened. Do not climb a closed stepladder. Make sure that both sides of the stepladder are locked into place. Ask someone to hold it for you, if possible. Clearly  mark and make sure that you can see: Any grab bars or handrails. First and last steps. Where the edge of each step is. Use tools that help you move around (mobility aids) if they are needed. These include: Canes. Walkers. Scooters. Crutches. Turn on the lights when you go into a dark area. Replace any light bulbs as soon as they burn out. Set up your furniture so you have a clear path. Avoid moving your furniture around. If any of your floors are uneven, fix them. If there are any pets around you, be aware of where they are. Review your medicines with your doctor. Some medicines can make you feel dizzy. This can increase your chance of falling. Ask your doctor what other things that you can do to help prevent falls. This information is not intended to replace advice given to you by your health care provider. Make sure you discuss any questions you have with your health care provider. Document Released: 07/31/2009 Document Revised: 03/11/2016 Document Reviewed: 11/08/2014 Elsevier Interactive Patient Education  2017 [employer].

## 2022-07-28 ENCOUNTER — Ambulatory Visit (HOSPITAL_COMMUNITY): Payer: Medicare Other | Admitting: Psychiatry

## 2022-07-28 ENCOUNTER — Encounter (HOSPITAL_COMMUNITY): Payer: Self-pay

## 2022-08-02 ENCOUNTER — Telehealth (HOSPITAL_COMMUNITY): Payer: Self-pay | Admitting: Psychiatry

## 2022-08-02 NOTE — Telephone Encounter (Signed)
Patient called and cancelled his appointment on 11/08.  He also wanted to make sure he was not going to be charged for missing his appointment on 10/11. He said he called to verify prior to the appointment that it was a MyChart visit and was told yes but it was scheduled as an office visit so he never got a call. I informed him he would not be charged.

## 2022-08-03 ENCOUNTER — Other Ambulatory Visit: Payer: Self-pay

## 2022-08-03 DIAGNOSIS — Z87891 Personal history of nicotine dependence: Secondary | ICD-10-CM

## 2022-08-03 DIAGNOSIS — Z122 Encounter for screening for malignant neoplasm of respiratory organs: Secondary | ICD-10-CM

## 2022-08-03 DIAGNOSIS — F17210 Nicotine dependence, cigarettes, uncomplicated: Secondary | ICD-10-CM

## 2022-08-25 ENCOUNTER — Ambulatory Visit (HOSPITAL_COMMUNITY): Payer: Medicare Other | Admitting: Psychiatry

## 2022-08-30 ENCOUNTER — Ambulatory Visit: Payer: Medicare Other | Admitting: Podiatry

## 2022-08-30 ENCOUNTER — Encounter: Payer: Self-pay | Admitting: Podiatry

## 2022-08-30 DIAGNOSIS — B351 Tinea unguium: Secondary | ICD-10-CM | POA: Diagnosis not present

## 2022-08-30 DIAGNOSIS — M79676 Pain in unspecified toe(s): Secondary | ICD-10-CM | POA: Diagnosis not present

## 2022-08-30 NOTE — Progress Notes (Signed)
This patient returns to the office for evaluation and treatment of long thick painful nails .  This patient is unable to trim his own nails since the patient cannot reach the feet.  Patient says the nails are painful walking and wearing his shoes.  He returns for preventive foot care services.  General Appearance  Alert, conversant and in no acute stress.  Vascular  Dorsalis pedis and posterior tibial  pulses are palpable  bilaterally.  Capillary return is within normal limits  bilaterally. Temperature is within normal limits  bilaterally.  Neurologic  Senn-Weinstein monofilament wire test within normal limits  bilaterally. Muscle power within normal limits bilaterally.  Nails Thick disfigured discolored nails with subungual debris  from hallux to fifth toes bilaterally. No evidence of bacterial infection or drainage bilaterally.  Orthopedic  No limitations of motion  feet .  No crepitus or effusions noted.  No bony pathology or digital deformities noted.  Skin  normotropic skin with no porokeratosis noted bilaterally.  No signs of infections or ulcers noted.     Onychomycosis  Pain in toes right foot  Pain in toes left foot  Debridement  of nails  1-5  B/L with a nail nipper.  Nails were then filed using a dremel tool with no incidents.    RTC 3 months    Dariya Gainer DPM  

## 2022-09-02 ENCOUNTER — Other Ambulatory Visit: Payer: Self-pay | Admitting: Physician Assistant

## 2022-09-07 ENCOUNTER — Other Ambulatory Visit: Payer: Self-pay | Admitting: Internal Medicine

## 2022-09-17 ENCOUNTER — Ambulatory Visit (HOSPITAL_BASED_OUTPATIENT_CLINIC_OR_DEPARTMENT_OTHER): Payer: Medicare Other

## 2022-09-21 ENCOUNTER — Other Ambulatory Visit: Payer: Self-pay | Admitting: Physician Assistant

## 2022-09-29 ENCOUNTER — Ambulatory Visit (HOSPITAL_BASED_OUTPATIENT_CLINIC_OR_DEPARTMENT_OTHER)
Admission: RE | Admit: 2022-09-29 | Discharge: 2022-09-29 | Disposition: A | Discharge status: Home / Self Care | Payer: Medicare Other | Source: Ambulatory Visit | Attending: Acute Care | Admitting: Acute Care

## 2022-09-29 DIAGNOSIS — Z122 Encounter for screening for malignant neoplasm of respiratory organs: Secondary | ICD-10-CM | POA: Diagnosis not present

## 2022-09-29 DIAGNOSIS — J439 Emphysema, unspecified: Secondary | ICD-10-CM | POA: Insufficient documentation

## 2022-09-29 DIAGNOSIS — F17210 Nicotine dependence, cigarettes, uncomplicated: Secondary | ICD-10-CM | POA: Diagnosis not present

## 2022-09-29 DIAGNOSIS — Z87891 Personal history of nicotine dependence: Secondary | ICD-10-CM

## 2022-09-29 DIAGNOSIS — I700 Atherosclerosis of aorta: Secondary | ICD-10-CM | POA: Insufficient documentation

## 2022-09-30 ENCOUNTER — Other Ambulatory Visit: Payer: Self-pay | Admitting: Acute Care

## 2022-09-30 DIAGNOSIS — F17210 Nicotine dependence, cigarettes, uncomplicated: Secondary | ICD-10-CM

## 2022-09-30 DIAGNOSIS — Z87891 Personal history of nicotine dependence: Secondary | ICD-10-CM

## 2022-09-30 DIAGNOSIS — Z122 Encounter for screening for malignant neoplasm of respiratory organs: Secondary | ICD-10-CM

## 2022-10-21 ENCOUNTER — Ambulatory Visit: Payer: Medicare Other | Admitting: Adult Health

## 2022-11-03 ENCOUNTER — Ambulatory Visit (HOSPITAL_COMMUNITY): Payer: Medicare Other | Admitting: Psychiatry

## 2022-11-04 ENCOUNTER — Other Ambulatory Visit: Payer: Self-pay | Admitting: Physician Assistant

## 2022-11-08 DIAGNOSIS — H401131 Primary open-angle glaucoma, bilateral, mild stage: Secondary | ICD-10-CM | POA: Diagnosis not present

## 2022-11-20 IMAGING — DX DG HAND COMPLETE 3+V*L*
3 series · 3 of 3 positions shown · non-contrast
Comparison: RIGHT hand evaluation of the same date.

CLINICAL DATA: Thumb pain for 6 months, chronic bilateral hand pain
mainly in the thumb and index finger.

EXAM:
LEFT HAND - COMPLETE 3+ VIEW

[hand ap]
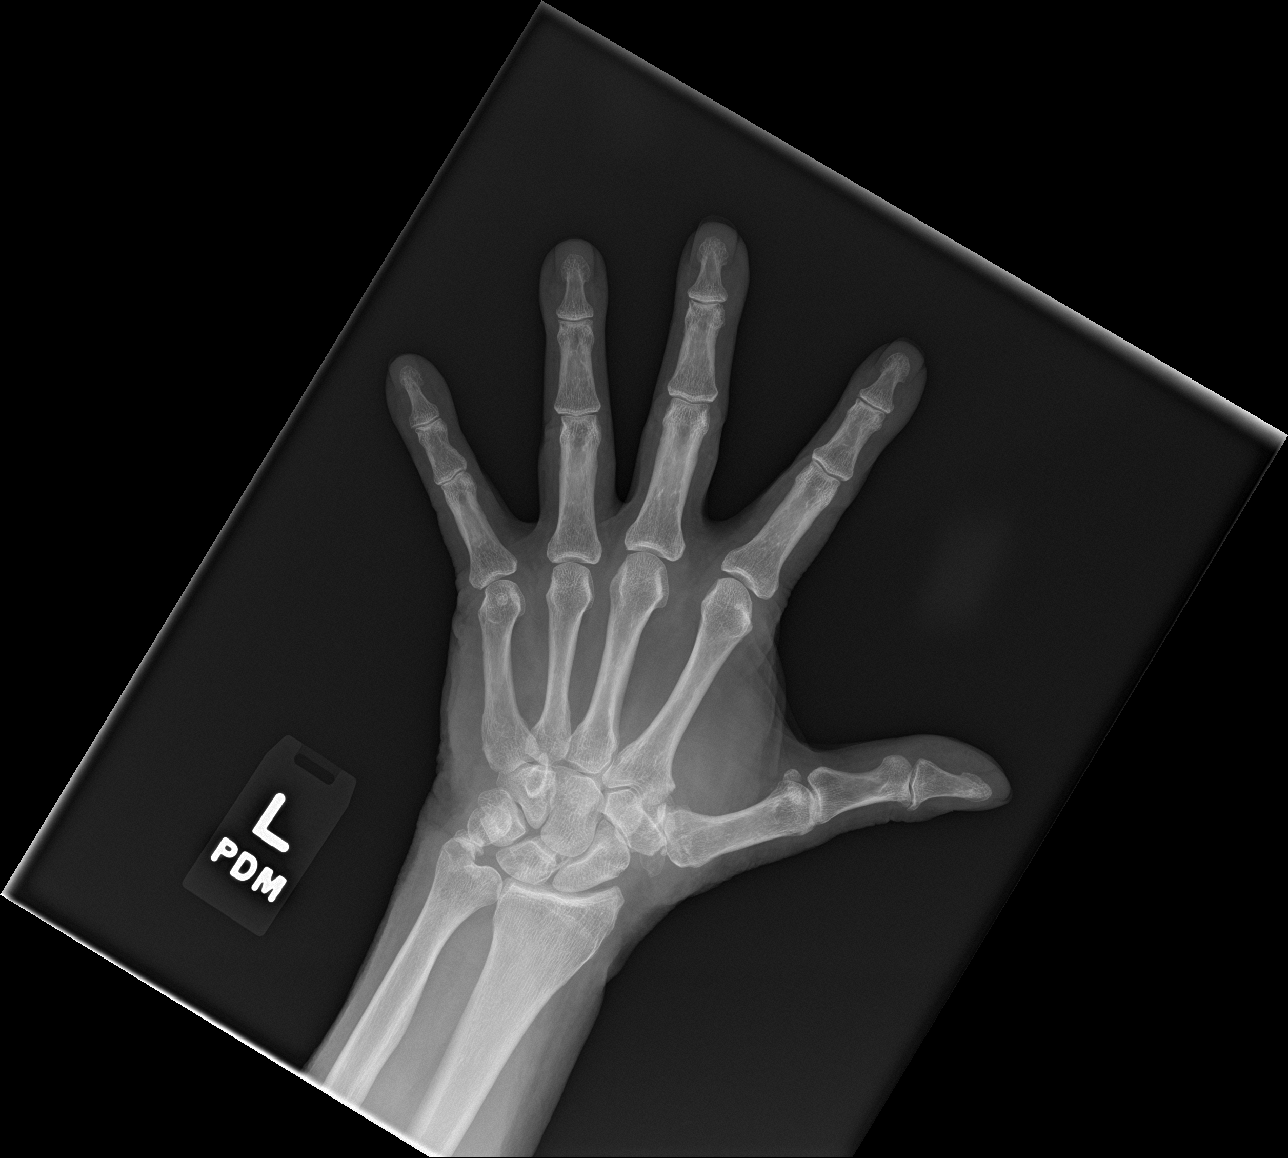

[hand obl]
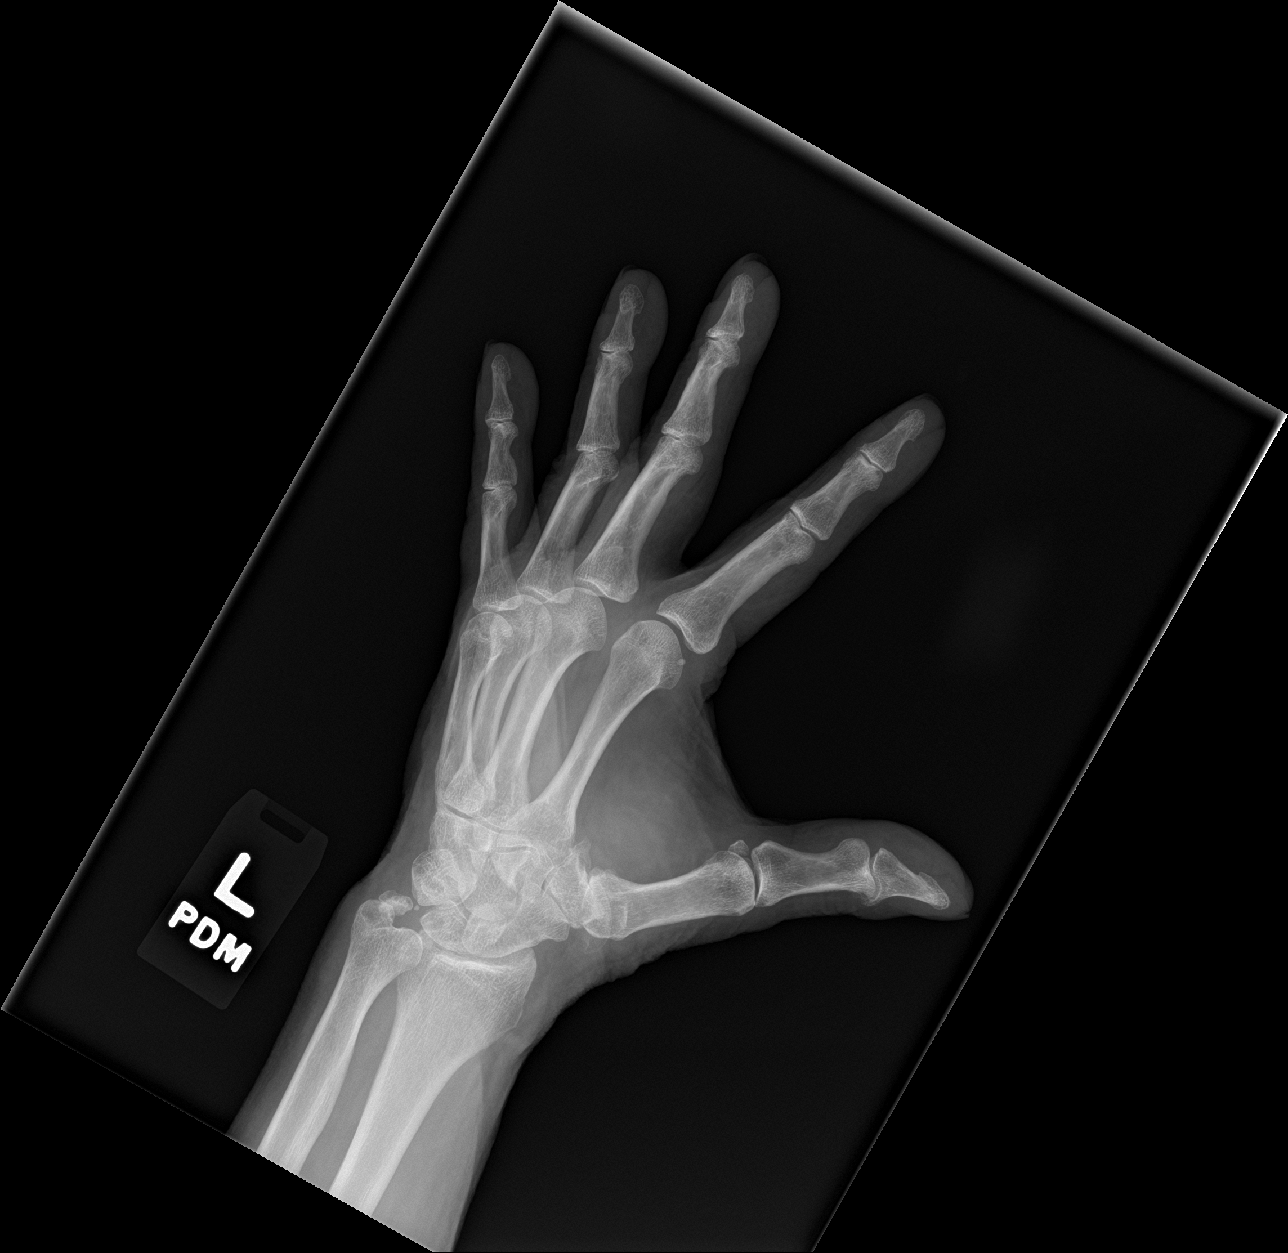

[hand lat]
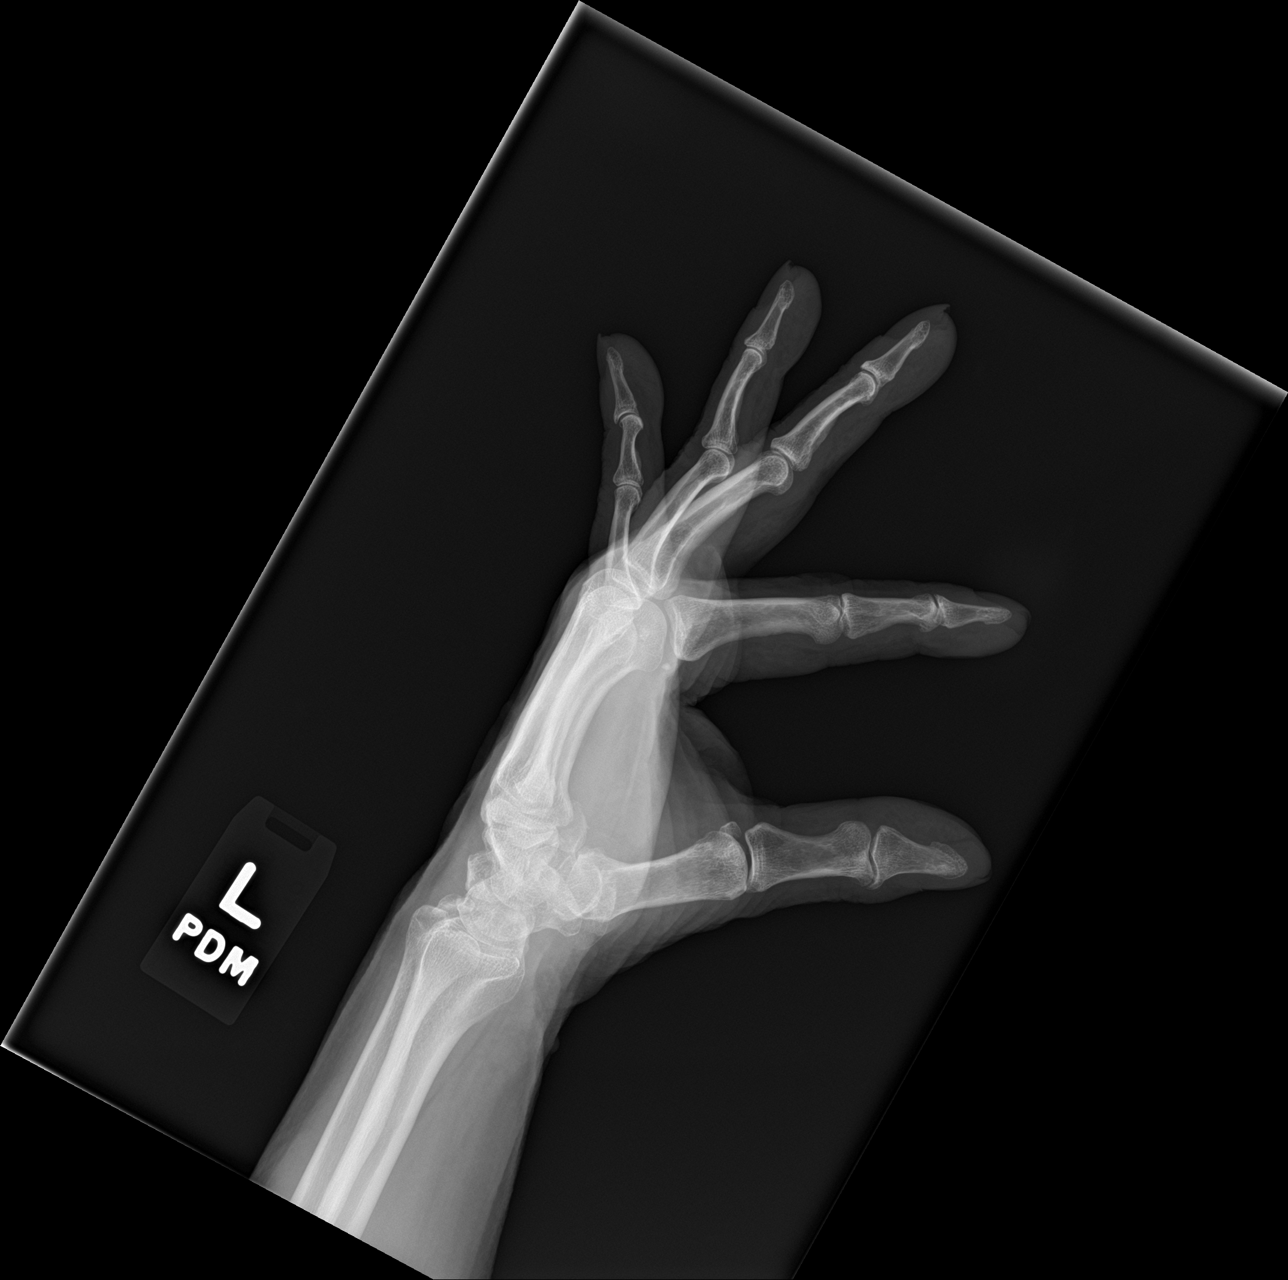

[3 of 3 positions shown; findings below may reference images not displayed]

FINDINGS: Well corticated bone fragment along the superior margin of the ulnar
styloid. Ulnar styloid is asymmetric to the contralateral side.

No sign of acute fracture. First CMC degenerative changes. No
significant soft tissue swelling.
IMPRESSION: 1. Signs of chronic injury to the ulnar styloid. Correlate with any
history of fracture in the past.
2. Signs of first CMC degenerative change.

## 2022-11-23 ENCOUNTER — Other Ambulatory Visit: Payer: Self-pay | Admitting: Physician Assistant

## 2022-11-26 ENCOUNTER — Other Ambulatory Visit: Payer: Self-pay | Admitting: Cardiovascular Disease

## 2022-11-29 ENCOUNTER — Ambulatory Visit: Payer: Medicare Other | Admitting: Podiatry

## 2022-11-29 ENCOUNTER — Encounter: Payer: Self-pay | Admitting: Podiatry

## 2022-11-29 DIAGNOSIS — M79676 Pain in unspecified toe(s): Secondary | ICD-10-CM | POA: Diagnosis not present

## 2022-11-29 DIAGNOSIS — B351 Tinea unguium: Secondary | ICD-10-CM

## 2022-11-29 NOTE — Progress Notes (Signed)
This patient returns to the office for evaluation and treatment of long thick painful nails .  This patient is unable to trim his own nails since the patient cannot reach the feet.  Patient says the nails are painful walking and wearing his shoes.  He returns for preventive foot care services.  General Appearance  Alert, conversant and in no acute stress.  Vascular  Dorsalis pedis and posterior tibial  pulses are palpable  bilaterally.  Capillary return is within normal limits  bilaterally. Temperature is within normal limits  bilaterally.  Neurologic  Senn-Weinstein monofilament wire test within normal limits  bilaterally. Muscle power within normal limits bilaterally.  Nails Thick disfigured discolored nails with subungual debris  from hallux to fifth toes bilaterally. No evidence of bacterial infection or drainage bilaterally.  Orthopedic  No limitations of motion  feet .  No crepitus or effusions noted.  No bony pathology or digital deformities noted.  Skin  normotropic skin with no porokeratosis noted bilaterally.  No signs of infections or ulcers noted.     Onychomycosis  Pain in toes right foot  Pain in toes left foot  Debridement  of nails  1-5  B/L with a nail nipper.  Nails were then filed using a dremel tool with no incidents.    RTC 3 months    Ramiro Pangilinan DPM  

## 2022-12-01 DIAGNOSIS — M79641 Pain in right hand: Secondary | ICD-10-CM | POA: Diagnosis not present

## 2022-12-01 DIAGNOSIS — M180 Bilateral primary osteoarthritis of first carpometacarpal joints: Secondary | ICD-10-CM | POA: Diagnosis not present

## 2022-12-01 DIAGNOSIS — M19041 Primary osteoarthritis, right hand: Secondary | ICD-10-CM | POA: Diagnosis not present

## 2022-12-08 ENCOUNTER — Ambulatory Visit (INDEPENDENT_AMBULATORY_CARE_PROVIDER_SITE_OTHER): Payer: Medicare Other | Admitting: Adult Health

## 2022-12-08 ENCOUNTER — Encounter: Payer: Self-pay | Admitting: Adult Health

## 2022-12-08 VITALS — BP 140/80 | HR 75 | Temp 98.6°F | Wt 159.0 lb

## 2022-12-08 DIAGNOSIS — N529 Male erectile dysfunction, unspecified: Secondary | ICD-10-CM | POA: Diagnosis not present

## 2022-12-08 DIAGNOSIS — F458 Other somatoform disorders: Secondary | ICD-10-CM

## 2022-12-08 MED ORDER — TADALAFIL 20 MG PO TABS: 10.0000 mg | ORAL_TABLET | ORAL | 2 refills | Status: DC | PRN

## 2022-12-08 NOTE — Progress Notes (Signed)
Subjective:    Patient ID: Jeffrey Hudson, male    DOB: 01/29/1951, 72 y.o.   MRN: [id number]  HPI 72 year old male who  has a past medical history of AMI (acute myocardial infarction) ([place]), Anxiety, Arthritis, Bipolar disorder ([place]), COPD (chronic obstructive pulmonary disease) ([place]), Coronary artery disease, Emphysema of lung ([place]), GERD (gastroesophageal reflux disease), Glaucoma, adenomatous polyp of colon (05/06/2021), Hyperlipidemia, Hypertension, Nodule of left lung, Plantar fasciitis, Pre-diabetes, Syncope and collapse, Tobacco abuse, and Viral URI with cough (11/22/2016).  He presents to the office today for a chronic issue of belching and excessive gas. He has been seen by GI and had endoscopy and colonoscopy done   He has also been seen by Pulmonary   He has tried reducing gas producing foods, PPI, H2 blocker, and Beano. He continues to smoke. Symptoms are no better but no worse either. Symptoms seem to be worse in the morning.   He continues to take his PPI without gerd symptoms    Additionally he reports that he is having issues with ED. In the past he got Viagra from [facility] and found that viagra did not help him. He is interested in trying Cialis   Review of Systems See HPI   Past Medical History:  Diagnosis Date   AMI (acute myocardial infarction) ([place])    Acute ST elevation   Anxiety    Arthritis    neck   Bipolar disorder ([place])    COPD (chronic obstructive pulmonary disease) ([place])    Coronary artery disease    a. s/p anterolateral STEMI with BMS placed in large diagonal branch (2009).   Emphysema of lung ([facility])    GERD (gastroesophageal reflux disease)    Glaucoma    Hx of adenomatous polyp of colon 05/06/2021   diminutive - no f/u needed   Hyperlipidemia    Hypertension    Nodule of left lung    6-mm smooothly rounded nodule   Plantar fasciitis    left foot   Pre-diabetes    Syncope and collapse    in 2001 and 2004 with no clear cause    Tobacco abuse    Viral URI with cough 11/22/2016    Social History   Socioeconomic History   Marital status: Married    Spouse name: Not on file   Number of children: 1   Years of education: Not on file   Highest education level: Bachelor's degree (e.g., BA, AB, BS)  Occupational History   Occupation: Retired-Sales/broker  Tobacco Use   Smoking status: Every Day    Packs/day: 0.25    Years: 45.00    Total pack years: 11.25    Types: Cigarettes   Smokeless tobacco: Never   Tobacco comments:    Patient states he's down to smoking 3 cigarettes a day/night  Vaping Use   Vaping Use: Never used  Substance and Sexual Activity   Alcohol use: Yes    Comment: 2 drinks daily   Drug use: No    Comment: marijuana quit 2010. He has hx of THC use.   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 - plays once a week    2 glasses of wine  a day max, still smoking 2 cups of coffee a day no drug use or other tobacco   Social Determinants of Health   Financial Resource Strain: Low Risk  (07/06/2022)   Overall Financial Resource Strain (CARDIA)    Difficulty of Paying Living Expenses: Not hard at all  Food Insecurity: No Food Insecurity (07/06/2022)   Hunger Vital Sign    Worried About Running Out of Food in the Last Year: Never true    Ran Out of Food in the Last Year: Never true  Transportation Needs: No Transportation Needs (07/06/2022)   PRAPARE - Hydrologist (Medical): No    Lack of Transportation (Non-Medical): No  Physical Activity: Inactive (07/06/2022)   Exercise Vital Sign    Days of Exercise per Week: 0 days    Minutes of Exercise per Session: 0 min  Stress: No Stress Concern Present (07/06/2022)   [place]    Feeling of Stress : Not at all  Social Connections: Moderately Isolated (12/01/2021)   Social Connection and Isolation Panel [NHANES]    Frequency of Communication with Friends and Family: Once a week    Frequency of Social Gatherings with Friends and Family: Once a week    Attends Religious Services: More than 4 times per year    Active Member of [employer] or Organizations: No    Attends Music therapist: Not on file    Marital Status: Married  Human resources officer Violence: Not At Risk (06/02/2021)   Humiliation, Afraid, Rape, and Kick questionnaire    Fear of Current or Ex-Partner: No    Emotionally Abused: No    Physically Abused: No    Sexually Abused: No    Past Surgical History:  Procedure Laterality Date   COLONOSCOPY     PERCUTANEOUS CORONARY STENT INTERVENTION (PCI-S)     UPPER GASTROINTESTINAL ENDOSCOPY      Family History  Problem Relation Age of Onset   Dementia Mother 7       died   Alcohol abuse Father 88   Depression Brother    Healthy Son    Colon cancer Neg Hx    Esophageal cancer Neg Hx    Rectal cancer Neg Hx    Stomach cancer Neg Hx     Allergies  Allergen Reactions   Lisinopril Swelling    Angio edema    Metformin And Related Diarrhea   Norvasc [Amlodipine] Swelling    Lower extremity     Current Outpatient Medications on File Prior to Visit  Medication Sig Dispense Refill   albuterol (PROAIR HFA) 108 (90 Base) MCG/ACT inhaler Inhale 2 puffs into the lungs every 6 (six) hours as needed. 1 Inhaler 3   amLODipine (NORVASC) 5 MG tablet Take by mouth.     aspirin 81 MG tablet Take 81 mg by mouth daily.     betamethasone dipropionate 0.05 % cream as needed.      clobetasol ointment (TEMOVATE) [id number] % Apply 1 application. topically 2 (two) times daily.     cyclobenzaprine (FLEXERIL) 10 MG tablet Take by mouth.     doxazosin (CARDURA) 1 MG tablet Take 1 tablet (1 mg total) by mouth at bedtime. 90 tablet 3    doxycycline (ADOXA) 100 MG tablet Take 100 mg by mouth 2 (two) times daily.     ezetimibe (ZETIA) 10 MG tablet TAKE 1 TABLET BY MOUTH DAILY 30 tablet 11   FLUoxetine (PROZAC)  20 MG capsule Take 1 capsule (20 mg total) by mouth daily. 90 capsule 2   Glycopyrrolate-Formoterol (BEVESPI AEROSPHERE) 9-4.8 MCG/ACT AERO Inhale 2 puffs into the lungs 2 (two) times daily. 10.7 g 5   hydrochlorothiazide (HYDRODIURIL) 25 MG tablet TAKE 1 TABLET BY MOUTH DAILY 30 tablet 10   latanoprost (XALATAN) 0.005 % ophthalmic solution SMARTSIG:1 Drop(s) In Eye(s) Every Evening     meloxicam (MOBIC) 7.5 MG tablet TAKE 1 TABLET BY MOUTH  DAILY 90 tablet 3   metoprolol succinate (TOPROL-XL) 25 MG 24 hr tablet TAKE 1 TABLET BY MOUTH ONCE  DAILY 30 tablet 11   Multiple Vitamin (MULTIVITAMIN) tablet Take 1 tablet by mouth daily.     nitroGLYCERIN (NITROSTAT) 0.4 MG SL tablet Place 1 tablet (0.4 mg total) under the tongue every 5 (five) minutes as needed for chest pain. 25 tablet 11   pantoprazole (PROTONIX) 40 MG tablet TAKE 1 TABLET BY MOUTH DAILY  BEFORE BREAKFAST 90 tablet 3   rosuvastatin (CRESTOR) 40 MG tablet Take 1 tablet (40 mg total) by mouth daily. 30 tablet 0   timolol (BETIMOL) 0.25 % ophthalmic solution Place 1-2 drops into both eyes daily.     No current facility-administered medications on file prior to visit.    BP (!) 140/80 (BP Location: Left Arm, Patient Position: Sitting, Cuff Size: Normal)   Pulse 75   Temp 98.6 F (37 C) (Oral)   Wt 159 lb (72.1 kg)   SpO2 97%   BMI 25.66 kg/m       Objective:   Physical Exam Vitals and nursing note reviewed.  Constitutional:      Appearance: Normal appearance.  Cardiovascular:     Rate and Rhythm: Normal rate and regular rhythm.     Pulses: Normal pulses.     Heart sounds: Normal heart sounds.  Pulmonary:     Effort: Pulmonary effort is normal.     Breath sounds: Normal breath sounds.  Abdominal:     General: Abdomen is flat.     Palpations:  Abdomen is soft.  Skin:    General: Skin is warm and dry.  Neurological:     General: No focal deficit present.     Mental Status: He is alert and oriented to person, place, and time.  Psychiatric:        Mood and Affect: Mood normal.        Behavior: Behavior normal.        Thought Content: Thought content normal.       Assessment & Plan:  1. Aerophagia - He would like a second opinion - will refer to [facility].  - Will have him stop protonix to see if his makes a difference.  - Ambulatory referral to Gastroenterology  2. Erectile dysfunction, unspecified erectile dysfunction type  - tadalafil (CIALIS) 20 MG tablet; Take 0.5-1 tablets (10-20 mg total) by mouth every other day as needed for erectile dysfunction.  Dispense: 10 tablet; Refill: 2  Dorothyann Peng, NP  Time spent with patient today was 30 minutes which consisted of chart review, discussing ED, aerophagia,  work up, treatment answering questions and documentation.

## 2022-12-09 ENCOUNTER — Other Ambulatory Visit: Payer: Self-pay | Admitting: Physician Assistant

## 2022-12-09 ENCOUNTER — Other Ambulatory Visit: Payer: Self-pay | Admitting: Cardiovascular Disease

## 2022-12-14 ENCOUNTER — Telehealth: Payer: Self-pay | Admitting: Cardiovascular Disease

## 2022-12-14 ENCOUNTER — Other Ambulatory Visit: Payer: Self-pay | Admitting: Cardiovascular Disease

## 2022-12-14 MED ORDER — EZETIMIBE 10 MG PO TABS: 10.0000 mg | ORAL_TABLET | Freq: Every day | ORAL | 0 refills | Status: DC

## 2022-12-14 NOTE — Telephone Encounter (Signed)
Pt's medication was sent to pt's pharmacy as requested. Confirmation received.  °

## 2022-12-14 NOTE — Telephone Encounter (Signed)
*  STAT* If patient is at the pharmacy, call can be transferred to refill team.   1. Which medications need to be refilled? (please list name of each medication and dose if known)   ezetimibe (ZETIA) 10 MG tablet   2. Which pharmacy/location (including street and city if local pharmacy) is medication to be sent to?  [place], [place]   3. Do they need a 30 day or 90 day supply? 90 day  Patient stated he still has medication left.  Patient has appointment on 4/17.

## 2022-12-17 ENCOUNTER — Other Ambulatory Visit: Payer: Self-pay | Admitting: Physician Assistant

## 2022-12-30 ENCOUNTER — Other Ambulatory Visit: Payer: Self-pay | Admitting: Cardiovascular Disease

## 2023-01-05 ENCOUNTER — Encounter (HOSPITAL_BASED_OUTPATIENT_CLINIC_OR_DEPARTMENT_OTHER): Payer: Self-pay | Admitting: Pulmonary Disease

## 2023-01-05 ENCOUNTER — Ambulatory Visit (HOSPITAL_BASED_OUTPATIENT_CLINIC_OR_DEPARTMENT_OTHER): Payer: Medicare Other | Admitting: Pulmonary Disease

## 2023-01-05 VITALS — BP 128/80 | HR 61 | Temp 99.2°F | Ht 66.0 in | Wt 154.6 lb

## 2023-01-05 DIAGNOSIS — G4733 Obstructive sleep apnea (adult) (pediatric): Secondary | ICD-10-CM | POA: Diagnosis not present

## 2023-01-05 DIAGNOSIS — J432 Centrilobular emphysema: Secondary | ICD-10-CM

## 2023-01-05 DIAGNOSIS — F17200 Nicotine dependence, unspecified, uncomplicated: Secondary | ICD-10-CM

## 2023-01-05 NOTE — Assessment & Plan Note (Signed)
He has self discontinued Bevespi.Mar[place] Kitchen  His lung function showed a drop in 2020 compared to 2015. Would like to repeat PFTs-based on this we can advise him about bronchodilator therapy.  Emphysema is noted on the CT scans

## 2023-01-05 NOTE — Patient Instructions (Signed)
  x schedule PFTs  Okay to take OTC decongestant such as Afrin for nasal blockage intermittently -continuous use is not advised Okay to trial chinstrap for mouth breathing during her sleep

## 2023-01-05 NOTE — Progress Notes (Signed)
   Subjective:    Patient ID: Jeffrey Hudson, male    DOB: 1951-03-25, 72 y.o.   MRN: [id number]  HPI   72 yo smoker for FU of COPD/ emphysema -more than 59 Pyrs   PMH - bipolar, mild OSA s/p UPPP , CAD  Chief Complaint  Patient presents with   Follow-up    Pt has been having issues where he feels like his nostrils are closing up which has been happening now for about 2 months. Pt states that he does have sinus drainage at times in the mornings.   2-year follow-up visit. His main complaint is aerophagia.  He has seen GI for this.  He had severe GERD which is decreased with Prilosec.  EGD and colonoscopy was negative.  He is going for a second opinion.  He also complains of nasal blockage bilaterally, occasional sinus drainage.  Only uses saline drops. He has decreased smoking to 2 to 3 cigarettes daily.  He admits that he is not using Bevespi.  He wanted to hold off on PFTs which was suggested on his last visit due to nasal symptoms and wanted to have this addressed first. Overall breathing is stable he denies interim exacerbation   Significant tests/ events reviewed  PFTs 03/2012 - FEV1 of 2.81- 99% FVC of 212% and ratio  of 63. Smaller airways were decreased at 41%. Lung volumes were preserved with DLCO 14.2-71%.    Spirometry 2015 - FEV1 of 2.59 -81% and FVC of 3.34-82% with ratio 78.  (ht 66)   12/2018 Spirometry- ratio of 71, FEV1 of  2.20-61% and FVC of 63% consistent with moderate airway obstruction (-some of this variation is related to difference in height entered)   CT chest in 2004 showed 6 mm nodule in the left lower lobe.but was also noted in 1995.  LDCT 07/2020 >> stable nodules, largest LUL 56mm, mod emphysema, atherosclerosis LDCT 09/2022 >> stable nodules, RADS 2   Review of Systems neg for any significant sore throat, dysphagia, itching, sneezing, nasal congestion or excess/ purulent secretions, fever, chills, sweats, unintended wt loss, pleuritic or exertional  cp, hempoptysis, orthopnea pnd or change in chronic leg swelling. Also denies presyncope, palpitations, heartburn, abdominal pain, nausea, vomiting, diarrhea or change in bowel or urinary habits, dysuria,hematuria, rash, arthralgias, visual complaints, headache, numbness weakness or ataxia.     Objective:   Physical Exam  Gen. Pleasant, well-nourished, in no distress ENT - no thrush, no pallor/icterus,no post nasal drip, deviated septum to left Neck: No JVD, no thyromegaly, no carotid bruits Lungs: no use of accessory muscles, no dullness to percussion, clear without rales or rhonchi  Cardiovascular: Rhythm regular, heart sounds  normal, no murmurs or gallops, no peripheral edema Musculoskeletal: No deformities, no cyanosis or clubbing        Assessment & Plan:

## 2023-01-05 NOTE — Assessment & Plan Note (Signed)
He does not want studies for this

## 2023-01-05 NOTE — Assessment & Plan Note (Addendum)
He is able to go an occasional day without cigarettes He is not willing to commit to a quit attempt yet but I am glad that he is getting there slowly Reviewed low-dose CT which is reassuring

## 2023-01-06 ENCOUNTER — Ambulatory Visit (INDEPENDENT_AMBULATORY_CARE_PROVIDER_SITE_OTHER): Payer: Medicare Other | Admitting: Pulmonary Disease

## 2023-01-06 DIAGNOSIS — J432 Centrilobular emphysema: Secondary | ICD-10-CM

## 2023-01-06 LAB — PULMONARY FUNCTION TEST
DL/VA % pred: 79 %
DL/VA: 3.23 ml/min/mmHg/L
DLCO cor % pred: 73 %
DLCO cor: 15.9 ml/min/mmHg
DLCO unc % pred: 73 %
DLCO unc: 15.9 ml/min/mmHg
FEF 25-75 Post: 1.65 L/sec
FEF 25-75 Pre: 1.05 L/sec
FEF2575-%Change-Post: 57 %
FEF2575-%Pred-Post: 88 %
FEF2575-%Pred-Pre: 55 %
FEV1-%Change-Post: 16 %
FEV1-%Pred-Post: 88 %
FEV1-%Pred-Pre: 76 %
FEV1-Post: 2.23 L
FEV1-Pre: 1.92 L
FEV1FVC-%Change-Post: 4 %
FEV1FVC-%Pred-Pre: 87 %
FEV6-%Change-Post: 10 %
FEV6-%Pred-Post: 101 %
FEV6-%Pred-Pre: 91 %
FEV6-Post: 3.32 L
FEV6-Pre: 3 L
FEV6FVC-%Change-Post: 0 %
FEV6FVC-%Pred-Post: 105 %
FEV6FVC-%Pred-Pre: 106 %
FVC-%Change-Post: 11 %
FVC-%Pred-Post: 96 %
FVC-%Pred-Pre: 86 %
FVC-Post: 3.37 L
FVC-Pre: 3.02 L
Post FEV1/FVC ratio: 66 %
Post FEV6/FVC ratio: 99 %
Pre FEV1/FVC ratio: 64 %
Pre FEV6/FVC Ratio: 99 %
RV % pred: 103 %
RV: 2.31 L
TLC % pred: 90 %
TLC: 5.48 L

## 2023-01-06 NOTE — Progress Notes (Signed)
Full PFT Performed Today  

## 2023-01-06 NOTE — Patient Instructions (Signed)
Full PFT Performed Today  

## 2023-01-11 DIAGNOSIS — R142 Eructation: Secondary | ICD-10-CM | POA: Diagnosis not present

## 2023-01-11 DIAGNOSIS — K219 Gastro-esophageal reflux disease without esophagitis: Secondary | ICD-10-CM | POA: Diagnosis not present

## 2023-01-18 DIAGNOSIS — K2100 Gastro-esophageal reflux disease with esophagitis, without bleeding: Secondary | ICD-10-CM | POA: Diagnosis not present

## 2023-01-18 DIAGNOSIS — R1319 Other dysphagia: Secondary | ICD-10-CM | POA: Diagnosis not present

## 2023-01-18 DIAGNOSIS — R142 Eructation: Secondary | ICD-10-CM | POA: Diagnosis not present

## 2023-01-19 ENCOUNTER — Telehealth: Payer: Self-pay | Admitting: Pulmonary Disease

## 2023-01-19 DIAGNOSIS — G4733 Obstructive sleep apnea (adult) (pediatric): Secondary | ICD-10-CM

## 2023-01-19 NOTE — Telephone Encounter (Signed)
New order placed

## 2023-01-19 NOTE — Telephone Encounter (Signed)
Pt called the office stating that he still has not heard anything about receiving chin strap to help with mouth breathing.  Order ws placed 01/05/23 but when looking at the order, it says that the order was cancelled for some reason on 3/21.   Please advise on this why the order is showing that it was cancelled.

## 2023-01-20 ENCOUNTER — Other Ambulatory Visit: Payer: Self-pay | Admitting: Physician Assistant

## 2023-01-24 ENCOUNTER — Telehealth: Payer: Self-pay | Admitting: Pulmonary Disease

## 2023-02-02 ENCOUNTER — Other Ambulatory Visit: Payer: Self-pay | Admitting: Cardiovascular Disease

## 2023-02-02 ENCOUNTER — Ambulatory Visit: Payer: Medicare Other | Admitting: Physician Assistant

## 2023-02-02 NOTE — Telephone Encounter (Signed)
Attempted to call pt but unable to reach. Left message to return call.   Due to multiple attempts trying to call pt and not being able to reach, per protocol encounter will be closed.

## 2023-02-02 NOTE — Telephone Encounter (Signed)
Called and spoke with Brad from [employer]. Nida Boatman stated that they needed patients sleep study in order to provide the chinstrap or the patient could pay for it hisself if he wanted to do the private pay. ATC patient. LVMTCB.

## 2023-02-14 ENCOUNTER — Other Ambulatory Visit: Payer: Self-pay | Admitting: Cardiovascular Disease

## 2023-02-16 DIAGNOSIS — R633 Feeding difficulties, unspecified: Secondary | ICD-10-CM | POA: Diagnosis not present

## 2023-02-16 DIAGNOSIS — R1310 Dysphagia, unspecified: Secondary | ICD-10-CM | POA: Diagnosis not present

## 2023-03-01 DIAGNOSIS — M79641 Pain in right hand: Secondary | ICD-10-CM | POA: Diagnosis not present

## 2023-03-01 DIAGNOSIS — M180 Bilateral primary osteoarthritis of first carpometacarpal joints: Secondary | ICD-10-CM | POA: Diagnosis not present

## 2023-03-01 DIAGNOSIS — M1811 Unilateral primary osteoarthritis of first carpometacarpal joint, right hand: Secondary | ICD-10-CM | POA: Diagnosis not present

## 2023-03-03 ENCOUNTER — Other Ambulatory Visit: Payer: Self-pay | Admitting: Cardiovascular Disease

## 2023-03-08 ENCOUNTER — Encounter: Payer: Self-pay | Admitting: Adult Health

## 2023-03-08 ENCOUNTER — Ambulatory Visit (INDEPENDENT_AMBULATORY_CARE_PROVIDER_SITE_OTHER): Payer: Medicare Other | Admitting: Adult Health

## 2023-03-08 VITALS — BP 120/62 | HR 53 | Temp 98.6°F | Ht 65.0 in | Wt 149.0 lb

## 2023-03-08 DIAGNOSIS — I714 Abdominal aortic aneurysm, without rupture, unspecified: Secondary | ICD-10-CM

## 2023-03-08 DIAGNOSIS — F17200 Nicotine dependence, unspecified, uncomplicated: Secondary | ICD-10-CM | POA: Diagnosis not present

## 2023-03-08 NOTE — Progress Notes (Signed)
Subjective:    Patient ID: Jeffrey Hudson, male    DOB: April 18, 1951, 72 y.o.   MRN: [id number]  HPI 72 year old male who  has a past medical history of AMI (acute myocardial infarction) (HCC), Anxiety, Arthritis, Bipolar disorder (HCC), COPD (chronic obstructive pulmonary disease) (HCC), Coronary artery disease, Emphysema of lung (HCC), GERD (gastroesophageal reflux disease), Glaucoma, adenomatous polyp of colon (05/06/2021), Hyperlipidemia, Hypertension, Nodule of left lung, Plantar fasciitis, Pre-diabetes, Syncope and collapse, Tobacco abuse, and Viral URI with cough (11/22/2016).  He presents to the office today after [facility] showed a AAA of 4.1 x4 1 x 4.1 cm. He has no other complaints. He continues to smoke     Review of Systems See HPI   Past Medical History:  Diagnosis Date   AMI (acute myocardial infarction) (HCC)    Acute ST elevation   Anxiety    Arthritis    neck   Bipolar disorder (HCC)    COPD (chronic obstructive pulmonary disease) (HCC)    Coronary artery disease    a. s/p anterolateral STEMI with BMS placed in large diagonal branch (2009).   Emphysema of lung (HCC)    GERD (gastroesophageal reflux disease)    Glaucoma    Hx of adenomatous polyp of colon 05/06/2021   diminutive - no f/u needed   Hyperlipidemia    Hypertension    Nodule of left lung    6-mm smooothly rounded nodule   Plantar fasciitis    left foot   Pre-diabetes    Syncope and collapse    in 2001 and 2004 with no clear cause   Tobacco abuse    Viral URI with cough 11/22/2016    Social History   Socioeconomic History   Marital status: Married    Spouse name: Not on file   Number of children: 1   Years of education: Not on file   Highest education level: Bachelor's degree (e.g., BA, AB, BS)  Occupational History   Occupation: Retired-Sales/broker  Tobacco Use   Smoking status: Every Day    Packs/day: 1.00    Years: 54.00     Additional pack years: 0.00    Total pack years: 54.00    Types: Cigarettes    Start date: 1970   Smokeless tobacco: Never   Tobacco comments:    Patient states he's down to smoking 3 cigarettes a day as of 01/05/23 ep  Vaping Use   Vaping Use: Never used  Substance and Sexual Activity   Alcohol use: Yes    Comment: 2 drinks daily   Drug use: No    Comment: marijuana quit 2010. He has hx of THC use.   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[employer]   He has been married for 35 years    One child (son)       He likes to [employer] - plays once a week    2 glasses of wine a day max, still smoking 2 cups of coffee a day no drug use or other tobacco   Social Determinants of Health   Financial Resource Strain: Low Risk  (07/06/2022)   Overall Financial Resource Strain (CARDIA)    Difficulty of Paying Living Expenses: Not hard at all  Food Insecurity: No Food Insecurity (07/06/2022)   Hunger Vital Sign  Worried About Programme researcher, broadcasting/film/video in the Last Year: Never true    Ran Out of Food in the Last Year: Never true  Transportation Needs: No Transportation Needs (07/06/2022)   PRAPARE - Administrator, Civil Service (Medical): No    Lack of Transportation (Non-Medical): No  Physical Activity: Inactive (07/06/2022)   Exercise Vital Sign    Days of Exercise per Week: 0 days    Minutes of Exercise per Session: 0 min  Stress: No Stress Concern Present (07/06/2022)   [employer] of Occupational Health - Occupational Stress Questionnaire    Feeling of Stress : Not at all  Social Connections: Moderately Isolated (12/01/2021)   Social Connection and Isolation Panel [NHANES]    Frequency of Communication with Friends and Family: Once a week    Frequency of Social Gatherings with Friends and Family: Once a week    Attends Religious Services: More than 4  times per year    Active Member of [employer] or Organizations: No    Attends Engineer, structural: Not on file    Marital Status: Married  Catering manager Violence: Not At Risk (06/02/2021)   Humiliation, Afraid, Rape, and Kick questionnaire    Fear of Current or Ex-Partner: No    Emotionally Abused: No    Physically Abused: No    Sexually Abused: No    Past Surgical History:  Procedure Laterality Date   COLONOSCOPY     PERCUTANEOUS CORONARY STENT INTERVENTION (PCI-S)     UPPER GASTROINTESTINAL ENDOSCOPY      Family History  Problem Relation Age of Onset   Dementia Mother 42       died   Alcohol abuse Father 79   Depression Brother    Healthy Son    Colon cancer Neg Hx    Esophageal cancer Neg Hx    Rectal cancer Neg Hx    Stomach cancer Neg Hx     Allergies  Allergen Reactions   Lisinopril Swelling    Angio edema    Metformin And Related Diarrhea   Norvasc [Amlodipine] Swelling    Lower extremity     Current Outpatient Medications on File Prior to Visit  Medication Sig Dispense Refill   albuterol (PROAIR HFA) 108 (90 Base) MCG/ACT inhaler Inhale 2 puffs into the lungs every 6 (six) hours as needed. 1 Inhaler 3   amLODipine (NORVASC) 5 MG tablet Take by mouth.     aspirin 81 MG tablet Take 81 mg by mouth daily.     betamethasone dipropionate 0.05 % cream as needed.      celecoxib (CELEBREX) 200 MG capsule Take by mouth.     clobetasol ointment (TEMOVATE) 0.05 % Apply 1 application. topically 2 (two) times daily.     cyclobenzaprine (FLEXERIL) 10 MG tablet Take by mouth.     doxazosin (CARDURA) 1 MG tablet TAKE 1 TABLET BY MOUTH AT  BEDTIME 30 tablet 1   ezetimibe (ZETIA) 10 MG tablet Take 1 tablet (10 mg total) by mouth daily. 90 tablet 0   fluocinonide (LIDEX) 0.05 % external solution      FLUoxetine (PROZAC) 20 MG capsule Take 1 capsule (20 mg total) by mouth daily. 90 capsule 2   hydrochlorothiazide (HYDRODIURIL) 25 MG tablet TAKE 1 TABLET BY MOUTH DAILY  30 tablet 10   latanoprost (XALATAN) 0.005 % ophthalmic solution SMARTSIG:1 Drop(s) In Eye(s) Every Evening     meloxicam (MOBIC) 7.5 MG tablet TAKE 1 TABLET BY MOUTH  DAILY 90 tablet 3   metoprolol succinate (TOPROL-XL) 25 MG 24 hr tablet TAKE 1 TABLET BY MOUTH ONCE  DAILY 30 tablet 1   Multiple Vitamin (MULTIVITAMIN) tablet Take 1 tablet by mouth daily.     nitroGLYCERIN (NITROSTAT) 0.4 MG SL tablet Place 1 tablet (0.4 mg total) under the tongue every 5 (five) minutes as needed for chest pain. 25 tablet 11   pantoprazole (PROTONIX) 40 MG tablet TAKE 1 TABLET BY MOUTH DAILY  BEFORE BREAKFAST 90 tablet 3   rosuvastatin (CRESTOR) 40 MG tablet TAKE 1 TABLET BY MOUTH DAILY 15 tablet 0   tadalafil (CIALIS) 20 MG tablet Take 0.5-1 tablets (10-20 mg total) by mouth every other day as needed for erectile dysfunction. 10 tablet 2   timolol (BETIMOL) 0.25 % ophthalmic solution Place 1-2 drops into both eyes daily.     No current facility-administered medications on file prior to visit.    BP 120/62   Pulse (!) 53   Temp 98.6 F (37 C) (Oral)   Ht 5\' 5"  (1.651 m)   Wt 149 lb (67.6 kg)   SpO2 96%   BMI 24.79 kg/m       Objective:   Physical Exam Vitals and nursing note reviewed.  Constitutional:      Appearance: Normal appearance.  Cardiovascular:     Rate and Rhythm: Normal rate and regular rhythm.     Pulses: Normal pulses.     Heart sounds: Normal heart sounds.  Pulmonary:     Effort: Pulmonary effort is normal.     Breath sounds: Normal breath sounds.  Abdominal:     General: Abdomen is flat. Bowel sounds are normal.     Palpations: Abdomen is soft.  Skin:    General: Skin is warm and dry.  Neurological:     General: No focal deficit present.     Mental Status: He is alert and oriented to person, place, and time.  Psychiatric:        Mood and Affect: Mood normal.        Behavior: Behavior normal.        Thought Content: Thought content normal.       Assessment & Plan:   1. TOBACCO ABUSE  - VAS US AORTA MEDICARE SCREEN; Future  2. Abdominal aortic aneurysm (AAA) without rupture, unspecified part (HCC)  - VAS US AORTA MEDICARE SCREEN; Future   Shirline Frees, NP  Time spent with patient today was 31 minutes which consisted of chart review, discussing diagnosis, work up, treatment answering questions and documentation.

## 2023-03-13 NOTE — Progress Notes (Unsigned)
Cardiology Office Note:    Date:  03/15/2023  ID:  [name], DOB 10-11-1951, MRN [id number] PCP: Shirline Frees, NP  [facility] Providers Cardiologist:  Verne Carrow, MD          Patient Profile:   Coronary artery disease  Ant-Lat STEMI in 2009 s/p BMS to Lg Dx branch Cath 4/11: Dx stent patent w 40-50 ISR, LAD several 20, LCx 50, dRCA 30, PLA 60 Myoview 12/19/14 - no ischemia COPD +Cigs Hypertension  Hyperlipidemia  Pre-Diabetes mellitus  Bipolar D/o  Abdominal aortic aneurysm       History of Present Illness:   Jeffrey Hudson is a 72 y.o. male who returns for f/u of CAD. He was last seen 10/30/21. He is here alone. He is having a lot of trouble with arthritis. He also has been dx with aerophagia. He has a lot of belching, flatulence related. He has seen speech therapy. He has not had chest pain, syncope, orthopnea, leg edema. He has chronic dyspnea on exertion that is stable.   ROS  See HPI    Studies Reviewed:    EKG:  sinus brady, HR 57, LAD, no STTW changes, QTc 420 ms   Risk Assessment/Calculations:     HYPERTENSION CONTROL Vitals:   03/15/23 0857 03/15/23 0944  BP: (!) 146/80 (!) 148/92    The patient's blood pressure is elevated above target today.  In order to address the patient's elevated BP: A current anti-hypertensive medication was adjusted today.          Physical Exam:   VS:  BP (!) 148/92   Pulse (!) 57   Ht 5\' 6"  (1.676 m)   Wt 147 lb (66.7 kg)   SpO2 97%   BMI 23.73 kg/m    Wt Readings from Last 3 Encounters:  03/15/23 147 lb (66.7 kg)  03/08/23 149 lb (67.6 kg)  01/05/23 154 lb 9.6 oz (70.1 kg)    Constitutional:      Appearance: Healthy appearance. Not in distress.  Neck:     Vascular: No carotid bruit. JVD normal.  Pulmonary:     Breath sounds: Normal breath sounds. No wheezing. No rales.  Cardiovascular:     Normal rate. Regular rhythm.     Murmurs: There is no murmur.  Edema:    Peripheral edema  absent.  Abdominal:     Palpations: Abdomen is soft.      ASSESSMENT AND PLAN:   CAD (coronary artery disease) History of anterolateral STEMI in 2009 treated with a bare-metal stent to the diagonal.  He had a patent stent in the diagonal by catheterization in 2011 and mild to moderate nonobstructive disease elsewhere.  Nuclear stress test in 2016 was negative for ischemia.  He has not had chest pain to suggest angina. Continue ASA 81 mg once daily, Crestor 40 mg once daily.   Abdominal aortic aneurysm (AAA) without rupture [facility]) He had a vascular screen recently that suggested an AAA. He will have a f/u abdominal US soon (ordered by PCP).   Essential hypertension BP above target. He has a hard time remembering the nightly Doxazosin.  He cannot take amlodipine secondary to edema.  He has an ACE inhibitor allergy.  He is not sure he can remember to take medications 3 times a day. Increase doxazosin to 2 mg daily.  He can take this in the morning.  I have asked him to use caution in case he has orthostasis. Continue HCTZ 25 mg daily,  Toprol-XL 25 mg daily. Low-sodium diet. Follow-up 3 months.  Hyperlipidemia Goal LDL is really <55.  Obtain follow-up CMET, lipids today.  Continue Crestor 40 mg daily, Zetia 10 mg daily.  TOBACCO ABUSE He is trying to quit.    Dispo:  Return in about 3 months (around 06/15/2023) for Routine Follow Up, w/ [name], PA-C.  Signed, [name], PA-C

## 2023-03-15 ENCOUNTER — Ambulatory Visit: Payer: Medicare Other | Attending: Physician Assistant | Admitting: Physician Assistant

## 2023-03-15 ENCOUNTER — Encounter: Payer: Self-pay | Admitting: Physician Assistant

## 2023-03-15 VITALS — BP 148/92 | HR 57 | Ht 66.0 in | Wt 147.0 lb

## 2023-03-15 DIAGNOSIS — I2510 Atherosclerotic heart disease of native coronary artery without angina pectoris: Secondary | ICD-10-CM | POA: Diagnosis not present

## 2023-03-15 DIAGNOSIS — I714 Abdominal aortic aneurysm, without rupture, unspecified: Secondary | ICD-10-CM | POA: Diagnosis not present

## 2023-03-15 DIAGNOSIS — I10 Essential (primary) hypertension: Secondary | ICD-10-CM

## 2023-03-15 DIAGNOSIS — E7800 Pure hypercholesterolemia, unspecified: Secondary | ICD-10-CM | POA: Diagnosis not present

## 2023-03-15 DIAGNOSIS — F17200 Nicotine dependence, unspecified, uncomplicated: Secondary | ICD-10-CM

## 2023-03-15 MED ORDER — DOXAZOSIN MESYLATE 2 MG PO TABS: 2.0000 mg | ORAL_TABLET | Freq: Every day | ORAL | 3 refills | Status: DC

## 2023-03-15 MED ORDER — HYDROCHLOROTHIAZIDE 25 MG PO TABS: 25.0000 mg | ORAL_TABLET | Freq: Every day | ORAL | 3 refills | Status: DC

## 2023-03-15 NOTE — Assessment & Plan Note (Signed)
Goal LDL is really <55.  Obtain follow-up CMET, lipids today.  Continue Crestor 40 mg daily, Zetia 10 mg daily.

## 2023-03-15 NOTE — Assessment & Plan Note (Signed)
He had a vascular screen recently that suggested an AAA. He will have a f/u abdominal US soon (ordered by PCP).

## 2023-03-15 NOTE — Assessment & Plan Note (Signed)
BP above target. He has a hard time remembering the nightly Doxazosin.  He cannot take amlodipine secondary to edema.  He has an ACE inhibitor allergy.  He is not sure he can remember to take medications 3 times a day. Increase doxazosin to 2 mg daily.  He can take this in the morning.  I have asked him to use caution in case he has orthostasis. Continue HCTZ 25 mg daily, Toprol-XL 25 mg daily. Low-sodium diet. Follow-up 3 months.

## 2023-03-15 NOTE — Patient Instructions (Signed)
Medication Instructions:  Your physician has recommended you make the following change in your medication:   INCREASE the Doxazosin to 2 mg taking 1 in the mornings.  You can take 2 of the 1 mg tablets and I have sent in a new prescription for the 2 mg tablet to [facility]   *If you need a refill on your cardiac medications before your next appointment, please call your pharmacy*   Lab Work: TODAY:  CMET & LIPID  If you have labs (blood work) drawn today and your tests are completely normal, you will receive your results only by: MyChart Message (if you have MyChart) OR A paper copy in the mail If you have any lab test that is abnormal or we need to change your treatment, we will call you to review the results.   Testing/Procedures: None ordered   Follow-Up: At [facility], you and your health needs are our priority.  As part of our continuing mission to provide you with exceptional heart care, we have created designated Provider Care Teams.  These Care Teams include your primary Cardiologist (physician) and Advanced Practice Providers (APPs -  Physician Assistants and Nurse Practitioners) who all work together to provide you with the care you need, when you need it.  We recommend signing up for the patient portal called "MyChart".  Sign up information is provided on this After Visit Summary.  MyChart is used to connect with patients for Virtual Visits (Telemedicine).  Patients are able to view lab/test results, encounter notes, upcoming appointments, etc.  Non-urgent messages can be sent to your provider as well.   To learn more about what you can do with MyChart, go to [email or web address].    Your next appointment:   3 month(s)  Provider:   [name], PA-C         Other Instructions DASH Eating Plan DASH stands for Dietary Approaches to Stop Hypertension. The DASH eating plan is a healthy eating plan that has been shown to: Lower high blood pressure  (hypertension). Reduce your risk for type 2 diabetes, heart disease, and stroke. Help with weight loss. What are tips for following this plan? Reading food labels Check food labels for the amount of salt (sodium) per serving. Choose foods with less than 5 percent of the Daily Value (DV) of sodium. In general, foods with less than 300 milligrams (mg) of sodium per serving fit into this eating plan. To find whole grains, look for the word "whole" as the first word in the ingredient list. Shopping Buy products labeled as "low-sodium" or "no salt added." Buy fresh foods. Avoid canned foods and pre-made or frozen meals. Cooking Try not to add salt when you cook. Use salt-free seasonings or herbs instead of table salt or sea salt. Check with your health care provider or pharmacist before using salt substitutes. Do not fry foods. Cook foods in healthy ways, such as baking, boiling, grilling, roasting, or broiling. Cook using oils that are good for your heart. These include olive, canola, avocado, soybean, and sunflower oil. Meal planning  Eat a balanced diet. This should include: 4 or more servings of fruits and 4 or more servings of vegetables each day. Try to fill half of your plate with fruits and vegetables. 6-8 servings of whole grains each day. 6 or less servings of lean meat, poultry, or fish each day. 1 oz is 1 serving. A 3 oz (85 g) serving of meat is about the same size as the palm  of your hand. One egg is 1 oz (28 g). 2-3 servings of low-fat dairy each day. One serving is 1 cup (237 mL). 1 serving of nuts, seeds, or beans 5 times each week. 2-3 servings of heart-healthy fats. Healthy fats called omega-3 fatty acids are found in foods such as walnuts, flaxseeds, fortified milks, and eggs. These fats are also found in cold-water fish, such as sardines, salmon, and mackerel. Limit how much you eat of: Canned or prepackaged foods. Food that is high in trans fat, such as fried foods. Food  that is high in saturated fat, such as fatty meat. Desserts and other sweets, sugary drinks, and other foods with added sugar. Full-fat dairy products. Do not salt foods before eating. Do not eat more than 4 egg yolks a week. Try to eat at least 2 vegetarian meals a week. Eat more home-cooked food and less restaurant, buffet, and fast food. Lifestyle When eating at a restaurant, ask if your food can be made with less salt or no salt. If you drink alcohol: Limit how much you have to: 0-1 drink a day if you are male. 0-2 drinks a day if you are male. Know how much alcohol is in your drink. In the [place] one drink is one 12 oz bottle of beer (355 mL), one 5 oz glass of wine (148 mL), or one 1 oz glass of hard liquor (44 mL). General information Avoid eating more than 2,300 mg of salt a day. If you have hypertension, you may need to reduce your sodium intake to 1,500 mg a day. Work with your provider to stay at a healthy body weight or lose weight. Ask what the best weight range is for you. On most days of the week, get at least 30 minutes of exercise that causes your heart to beat faster. This may include walking, swimming, or biking. Work with your provider or dietitian to adjust your eating plan to meet your specific calorie needs. What foods should I eat? Fruits All fresh, dried, or frozen fruit. Canned fruits that are in their natural juice and do not have sugar added to them. Vegetables Fresh or frozen vegetables that are raw, steamed, roasted, or grilled. Low-sodium or reduced-sodium tomato and vegetable juice. Low-sodium or reduced-sodium tomato sauce and tomato paste. Low-sodium or reduced-sodium canned vegetables. Grains Whole-grain or whole-wheat bread. Whole-grain or whole-wheat pasta. Brown rice. Orpah Cobb. Bulgur. Whole-grain and low-sodium cereals. Pita bread. Low-fat, low-sodium crackers. Whole-wheat flour tortillas. Meats and other proteins Skinless chicken or [place].  Ground chicken or [place]. Pork with fat trimmed off. Fish and seafood. Egg whites. Dried beans, peas, or lentils. Unsalted nuts, nut butters, and seeds. Unsalted canned beans. Lean cuts of beef with fat trimmed off. Low-sodium, lean precooked or cured meat, such as sausages or meat loaves. Dairy Low-fat (1%) or fat-free (skim) milk. Reduced-fat, low-fat, or fat-free cheeses. Nonfat, low-sodium ricotta or cottage cheese. Low-fat or nonfat yogurt. Low-fat, low-sodium cheese. Fats and oils Soft margarine without trans fats. Vegetable oil. Reduced-fat, low-fat, or light mayonnaise and salad dressings (reduced-sodium). Canola, safflower, olive, avocado, soybean, and sunflower oils. Avocado. Seasonings and condiments Herbs. Spices. Seasoning mixes without salt. Other foods Unsalted popcorn and pretzels. Fat-free sweets. The items listed above may not be all the foods and drinks you can have. Talk to a dietitian to learn more. What foods should I avoid? Fruits Canned fruit in a light or heavy syrup. Fried fruit. Fruit in cream or butter sauce. Vegetables Creamed or fried vegetables.  Vegetables in a cheese sauce. Regular canned vegetables that are not marked as low-sodium or reduced-sodium. Regular canned tomato sauce and paste that are not marked as low-sodium or reduced-sodium. Regular tomato and vegetable juices that are not marked as low-sodium or reduced-sodium. Rosita Fire. Olives. Grains Baked goods made with fat, such as croissants, muffins, or some breads. Dry pasta or rice meal packs. Meats and other proteins Fatty cuts of meat. Ribs. Fried meat. Tomasa Blase. Bologna, salami, and other precooked or cured meats, such as sausages or meat loaves, that are not lean and low in sodium. Fat from the back of a pig (fatback). Bratwurst. Salted nuts and seeds. Canned beans with added salt. Canned or smoked fish. Whole eggs or egg yolks. Chicken or [place] with skin. Dairy Whole or 2% milk, cream, and half-and-half.  Whole or full-fat cream cheese. Whole-fat or sweetened yogurt. Full-fat cheese. Nondairy creamers. Whipped toppings. Processed cheese and cheese spreads. Fats and oils Butter. Stick margarine. Lard. Shortening. Ghee. Bacon fat. Tropical oils, such as coconut, palm kernel, or palm oil. Seasonings and condiments Onion salt, garlic salt, seasoned salt, table salt, and sea salt. Worcestershire sauce. Tartar sauce. Barbecue sauce. Teriyaki sauce. Soy sauce, including reduced-sodium soy sauce. Steak sauce. Canned and packaged gravies. Fish sauce. Oyster sauce. Cocktail sauce. Store-bought horseradish. Ketchup. Mustard. Meat flavorings and tenderizers. Bouillon cubes. Hot sauces. Pre-made or packaged marinades. Pre-made or packaged taco seasonings. Relishes. Regular salad dressings. Other foods Salted popcorn and pretzels. The items listed above may not be all the foods and drinks you should avoid. Talk to a dietitian to learn more. Where to find more information National Heart, Lung, and Blood Institute (NHLBI): BuffaloDryCleaner.gl American Heart Association (AHA): heart.org Academy of Nutrition and Dietetics: eatright.org National Kidney Foundation (NKF): kidney.org This information is not intended to replace advice given to you by your health care provider. Make sure you discuss any questions you have with your health care provider. Document Revised: 10/21/2022 Document Reviewed: 10/21/2022 Elsevier Patient Education  2024 [employer].

## 2023-03-15 NOTE — Assessment & Plan Note (Signed)
He is trying to quit.  

## 2023-03-15 NOTE — Assessment & Plan Note (Signed)
History of anterolateral STEMI in 2009 treated with a bare-metal stent to the diagonal.  He had a patent stent in the diagonal by catheterization in 2011 and mild to moderate nonobstructive disease elsewhere.  Nuclear stress test in 2016 was negative for ischemia.  He has not had chest pain to suggest angina. Continue ASA 81 mg once daily, Crestor 40 mg once daily.

## 2023-03-16 ENCOUNTER — Other Ambulatory Visit: Payer: Self-pay | Admitting: Cardiovascular Disease

## 2023-03-16 LAB — COMPREHENSIVE METABOLIC PANEL
ALT: 51 IU/L — ABNORMAL HIGH (ref 0–44)
AST: 55 IU/L — ABNORMAL HIGH (ref 0–40)
Albumin/Globulin Ratio: 2.3 — ABNORMAL HIGH (ref 1.2–2.2)
Albumin: 4.4 g/dL (ref 3.8–4.8)
Alkaline Phosphatase: 81 IU/L (ref 44–121)
BUN/Creatinine Ratio: 17 (ref 10–24)
BUN: 17 mg/dL (ref 8–27)
Bilirubin Total: 1.3 mg/dL — ABNORMAL HIGH (ref 0.0–1.2)
CO2: 25 mmol/L (ref 20–29)
Calcium: 10 mg/dL (ref 8.6–10.2)
Chloride: 98 mmol/L (ref 96–106)
Creatinine, Ser: 0.99 mg/dL (ref 0.76–1.27)
Globulin, Total: 1.9 g/dL (ref 1.5–4.5)
Glucose: 115 mg/dL — ABNORMAL HIGH (ref 70–99)
Potassium: 4.3 mmol/L (ref 3.5–5.2)
Sodium: 138 mmol/L (ref 134–144)
Total Protein: 6.3 g/dL (ref 6.0–8.5)
eGFR: 80 mL/min/{1.73_m2} (ref >59)

## 2023-03-16 LAB — LIPID PANEL
Chol/HDL Ratio: 2.3 ratio (ref 0.0–5.0)
Cholesterol, Total: 173 mg/dL (ref 100–199)
HDL: 75 mg/dL (ref >39)
LDL Chol Calc (NIH): 72 mg/dL (ref 0–99)
Triglycerides: 154 mg/dL — ABNORMAL HIGH (ref 0–149)
VLDL Cholesterol Cal: 26 mg/dL (ref 5–40)

## 2023-03-17 ENCOUNTER — Telehealth: Payer: Self-pay | Admitting: *Deleted

## 2023-03-17 DIAGNOSIS — E7800 Pure hypercholesterolemia, unspecified: Secondary | ICD-10-CM

## 2023-03-17 MED ORDER — METOPROLOL SUCCINATE ER 25 MG PO TB24: 25.0000 mg | ORAL_TABLET | Freq: Every day | ORAL | 3 refills | Status: DC

## 2023-03-17 NOTE — Telephone Encounter (Signed)
-----   Message from Jeffrey Hudson, [place] sent at 03/16/2023  5:48 PM EDT ----- Results sent to Jeffrey Hudson via MyChart. See MyChart comments below. PLAN:  -Continue current meds -If he drinks alcohol, reduce intake -Refer to Lipid clinic to consider PCSK9 inhib -Repeat LFTs in 6 weeks.   Jeffrey Hudson  Your creatinine (kidney function), potassium are normal.  Your liver enzymes (AST/ALT) are mildly elevated.  Your triglycerides are mildly elevated.  Your LDL cholesterol is above goal at 72.  Goal is <55.  I will refer you to our lipid clinic for consideration of alternative therapies for cholesterol.  Continue current medications.  If you drink any alcohol, reduce intake. Jeffrey Newcomer, PA-C

## 2023-03-18 ENCOUNTER — Other Ambulatory Visit: Payer: Self-pay | Admitting: Cardiovascular Disease

## 2023-03-21 ENCOUNTER — Ambulatory Visit: Payer: Medicare Other | Admitting: Podiatry

## 2023-03-21 ENCOUNTER — Encounter: Payer: Self-pay | Admitting: Podiatry

## 2023-03-21 DIAGNOSIS — B351 Tinea unguium: Secondary | ICD-10-CM | POA: Diagnosis not present

## 2023-03-21 DIAGNOSIS — M79676 Pain in unspecified toe(s): Secondary | ICD-10-CM

## 2023-03-21 NOTE — Progress Notes (Signed)
This patient returns to the office for evaluation and treatment of long thick painful nails .  This patient is unable to trim his own nails since the patient cannot reach the feet.  Patient says the nails are painful walking and wearing his shoes.  He returns for preventive foot care services.  General Appearance  Alert, conversant and in no acute stress.  Vascular  Dorsalis pedis and posterior tibial  pulses are palpable  bilaterally.  Capillary return is within normal limits  bilaterally. Temperature is within normal limits  bilaterally.  Neurologic  Senn-Weinstein monofilament wire test within normal limits  bilaterally. Muscle power within normal limits bilaterally.  Nails Thick disfigured discolored nails with subungual debris  from hallux to fifth toes bilaterally. No evidence of bacterial infection or drainage bilaterally.  Orthopedic  No limitations of motion  feet .  No crepitus or effusions noted.  No bony pathology or digital deformities noted.  Skin  normotropic skin with no porokeratosis noted bilaterally.  No signs of infections or ulcers noted.     Onychomycosis  Pain in toes right foot  Pain in toes left foot  Debridement  of nails  1-5  B/L with a nail nipper.  Nails were then filed using a dremel tool with no incidents.    RTC 3 months    Florentina Marquart DPM  

## 2023-03-29 ENCOUNTER — Encounter (HOSPITAL_COMMUNITY): Payer: Medicare Other

## 2023-03-31 ENCOUNTER — Encounter: Payer: Medicare Other | Admitting: Adult Health

## 2023-04-04 ENCOUNTER — Ambulatory Visit (HOSPITAL_COMMUNITY)
Admission: RE | Admit: 2023-04-04 | Discharge: 2023-04-04 | Disposition: A | Discharge status: Home / Self Care | Payer: Medicare Other | Source: Ambulatory Visit | Attending: Adult Health | Admitting: Adult Health

## 2023-04-04 DIAGNOSIS — I714 Abdominal aortic aneurysm, without rupture, unspecified: Secondary | ICD-10-CM | POA: Insufficient documentation

## 2023-04-04 DIAGNOSIS — F17200 Nicotine dependence, unspecified, uncomplicated: Secondary | ICD-10-CM | POA: Insufficient documentation

## 2023-04-04 DIAGNOSIS — F17210 Nicotine dependence, cigarettes, uncomplicated: Secondary | ICD-10-CM | POA: Insufficient documentation

## 2023-04-04 DIAGNOSIS — Z136 Encounter for screening for cardiovascular disorders: Secondary | ICD-10-CM | POA: Insufficient documentation

## 2023-04-04 NOTE — Progress Notes (Unsigned)
Patient name: Jeffrey Hudson MRN: [id number] DOB: [birth year] Sex: male  REASON FOR CONSULT: 6.1 cm right common iliac artery aneurysm  HPI: Jeffrey Hudson is a 72 y.o. male, with hx COPD, CAD s/p STEMI, HTN, HLD, tobacco abuse that presents for evaluation of a 6.1 cm right common iliac artery aneurysm.  Patient underwent ultrasound aorta Medicare screening on 04/03/2023.  There was evidence of a 4.8 cm abdominal aortic aneurysm with a 6.1 cm right common iliac artery aneurysm.  Patient denies any prior knowledge of the aneurysm.  He is not having any abdominal or back pain.  States he is down to smoking about 2 cigarettes a day but previously was smoking a pack a day for about 40 years.  No previous abdominal surgery.  Does have an umbilical hernia.  Past Medical History:  Diagnosis Date   AMI (acute myocardial infarction) (HCC)    Acute ST elevation   Anxiety    Arthritis    neck   Bipolar disorder (HCC)    COPD (chronic obstructive pulmonary disease) (HCC)    Coronary artery disease    a. s/p anterolateral STEMI with BMS placed in large diagonal branch (2009).   Emphysema of lung (HCC)    GERD (gastroesophageal reflux disease)    Glaucoma    Hx of adenomatous polyp of colon 05/06/2021   diminutive - no f/u needed   Hyperlipidemia    Hypertension    Nodule of left lung    6-mm smooothly rounded nodule   Plantar fasciitis    left foot   Pre-diabetes    Syncope and collapse    in 2001 and 2004 with no clear cause   Tobacco abuse    Viral URI with cough 11/22/2016    Past Surgical History:  Procedure Laterality Date   COLONOSCOPY     PERCUTANEOUS CORONARY STENT INTERVENTION (PCI-S)     UPPER GASTROINTESTINAL ENDOSCOPY      Family History  Problem Relation Age of Onset   Dementia Mother 25       died   Alcohol abuse Father 35   Depression Brother    Healthy Son    Colon cancer Neg Hx    Esophageal cancer Neg Hx    Rectal cancer Neg Hx    Stomach cancer Neg  Hx     SOCIAL HISTORY: Social History   Socioeconomic History   Marital status: Married    Spouse name: Not on file   Number of children: 1   Years of education: Not on file   Highest education level: Bachelor's degree (e.g., BA, AB, BS)  Occupational History   Occupation: Retired-Sales/broker  Tobacco Use   Smoking status: Every Day    Packs/day: 1.00    Years: 54.00    Additional pack years: 0.00    Total pack years: 54.00    Types: Cigarettes    Start date: 1970   Smokeless tobacco: Never   Tobacco comments:    Patient states he's down to smoking 3 cigarettes a day as of 01/05/23 ep  Vaping Use   Vaping Use: Never used  Substance and Sexual Activity   Alcohol use: Yes    Comment: 2 drinks daily   Drug use: No    Comment: marijuana quit 2010. He has hx of THC use.   Sexual activity: Not on file  Other Topics Concern   Not on file  Social History Narrative   Retired Warehouse manager in Secretary/administrator  graduate of [employer]   He has been married for 35 years    One child (son)       He likes to [employer] - plays once a week    2 glasses of wine a day max, still smoking 2 cups of coffee a day no drug use or other tobacco   Social Determinants of Health   Financial Resource Strain: Low Risk  (07/06/2022)   Overall Financial Resource Strain (CARDIA)    Difficulty of Paying Living Expenses: Not hard at all  Food Insecurity: No Food Insecurity (07/06/2022)   Hunger Vital Sign    Worried About Running Out of Food in the Last Year: Never true    Ran Out of Food in the Last Year: Never true  Transportation Needs: No Transportation Needs (07/06/2022)   PRAPARE - Administrator, Civil Service (Medical): No    Lack of Transportation (Non-Medical): No  Physical Activity: Inactive (07/06/2022)   Exercise Vital Sign    Days of Exercise per Week: 0 days    Minutes of Exercise per Session: 0 min  Stress: No Stress Concern Present (07/06/2022)   [employer] of Occupational Health - Occupational Stress Questionnaire    Feeling of Stress : Not at all  Social Connections: Moderately Isolated (12/01/2021)   Social Connection and Isolation Panel [NHANES]    Frequency of Communication with Friends and Family: Once a week    Frequency of Social Gatherings with Friends and Family: Once a week    Attends Religious Services: More than 4 times per year    Active Member of [employer] or Organizations: No    Attends Banker Meetings: Not on file    Marital Status: Married  Catering manager Violence: Not At Risk (06/02/2021)   Humiliation, Afraid, Rape, and Kick questionnaire    Fear of Current or Ex-Partner: No    Emotionally Abused: No    Physically Abused: No    Sexually Abused: No    Allergies  Allergen Reactions   Lisinopril Swelling    Angio edema    Metformin And Related Diarrhea   Norvasc [Amlodipine] Swelling    Lower extremity     Current Outpatient Medications  Medication Sig Dispense Refill   albuterol (PROAIR HFA) 108 (90 Base) MCG/ACT inhaler Inhale 2 puffs into the lungs every 6 (six) hours as needed. 1 Inhaler 3   aspirin 81 MG tablet Take 81 mg by mouth daily.     betamethasone dipropionate 0.05 % cream Apply 1 application  topically as needed (for eczema).     clobetasol ointment (TEMOVATE) 0.05 % Apply 1 application. topically 2 (two) times daily.     cyclobenzaprine (FLEXERIL) 10 MG tablet Take 10 mg by mouth daily.     doxazosin (CARDURA) 2 MG tablet Take 1 tablet (2 mg total) by mouth daily. 90 tablet 3   ezetimibe (ZETIA) 10 MG tablet Take 1 tablet (10 mg total) by mouth daily. 90 tablet 0   fluocinonide (LIDEX) 0.05 % external solution Apply 1 Application topically 2 (two) times daily.     FLUoxetine (PROZAC) 20 MG capsule Take 1 capsule (20 mg total) by mouth daily. 90 capsule 2   hydrochlorothiazide (HYDRODIURIL) 25 MG tablet Take 1 tablet (25 mg total) by mouth daily. 90 tablet 3   latanoprost  (XALATAN) 0.005 % ophthalmic solution SMARTSIG:1 Drop(s) In Eye(s) Every Evening     meloxicam (MOBIC) 7.5 MG tablet TAKE 1 TABLET BY MOUTH  DAILY 90 tablet 3   metoprolol succinate (TOPROL-XL) 25 MG 24 hr tablet Take 1 tablet (25 mg total) by mouth daily. 90 tablet 3   Multiple Vitamin (MULTIVITAMIN) tablet Take 1 tablet by mouth daily.     nitroGLYCERIN (NITROSTAT) 0.4 MG SL tablet Place 1 tablet (0.4 mg total) under the tongue every 5 (five) minutes as needed for chest pain. 25 tablet 11   pantoprazole (PROTONIX) 40 MG tablet TAKE 1 TABLET BY MOUTH DAILY  BEFORE BREAKFAST 90 tablet 3   rosuvastatin (CRESTOR) 40 MG tablet TAKE 1 TABLET BY MOUTH DAILY 90 tablet 3   tadalafil (CIALIS) 20 MG tablet Take 0.5-1 tablets (10-20 mg total) by mouth every other day as needed for erectile dysfunction. 10 tablet 2   timolol (BETIMOL) 0.25 % ophthalmic solution Place 1-2 drops into both eyes daily.     No current facility-administered medications for this visit.    REVIEW OF SYSTEMS:  [X]  denotes positive finding, [ ]  denotes negative finding Cardiac  Comments:  Chest pain or chest pressure:    Shortness of breath upon exertion:    Short of breath when lying flat:    Irregular heart rhythm:        Vascular    Pain in calf, thigh, or hip brought on by ambulation:    Pain in feet at night that wakes you up from your sleep:     Blood clot in your veins:    Leg swelling:         Pulmonary    Oxygen at home:    Productive cough:     Wheezing:         Neurologic    Sudden weakness in arms or legs:     Sudden numbness in arms or legs:     Sudden onset of difficulty speaking or slurred speech:    Temporary loss of vision in one eye:     Problems with dizziness:         Gastrointestinal    Blood in stool:     Vomited blood:         Genitourinary    Burning when urinating:     Blood in urine:        Psychiatric    Major depression:         Hematologic    Bleeding problems:    Problems  with blood clotting too easily:        Skin    Rashes or ulcers:        Constitutional    Fever or chills:      PHYSICAL EXAM: There were no vitals filed for this visit.  GENERAL: The patient is a well-nourished male, in no acute distress. The vital signs are documented above. CARDIAC: There is a regular rate and rhythm.  VASCULAR:  Bilateral femoral pulses palpable Bilateral PT pulses palpable PULMONARY: No respiratory distress. ABDOMEN: Soft and non-tender.  No pain with deep palpation of aneurysm. MUSCULOSKELETAL: There are no major deformities or cyanosis. NEUROLOGIC: No focal weakness or paresthesias are detected. SKIN: There are no ulcers or rashes noted. PSYCHIATRIC: The patient has a normal affect.  DATA:   ABDOMINAL AORTA STUDY   Patient Name:  HAIDEN GARDE  Date of Exam:   04/04/2023  Medical Rec #: [id number]          Accession #:    [id number]  Date of Birth: 1951-02-10         Patient Gender:  M  Patient Age:   33 years  Exam Location:  [facility]  Procedure:      VAS US AORTA MEDICARE SCREEN  Referring Phys: CORY NAFZIGER    ---------------------------------------------------------------------------  -----    Indications: Patient was told at [facility] screening he had a small AAA. He               denies back, abdominal and leg pains.   Risk         Hypertension, hyperlipidemia, current smoker, coronary artery  Factors:     disease.   Other Factors: History of OSA                   History of COPD.   Limitations: Air/bowel gas.     Comparison Study: None   Performing Technologist: Alecia Mackin RVT, RDCS (AE), RDMS     Examination Guidelines: A complete evaluation includes B-mode imaging,  spectral  Doppler, color Doppler, and power Doppler as needed of all accessible  portions  of each vessel. Bilateral testing is considered an integral part of a  complete  examination. Limited examinations for reoccurring indications may be  performed   as noted.     Abdominal Aorta Findings:  +-------------+-------+----------+----------+----------+--------+--------+  Location    AP (cm)Trans (cm)PSV (cm/s)Waveform  ThrombusComments  +-------------+-------+----------+----------+----------+--------+--------+  Proximal    2.10   1.90      76        biphasic                    +-------------+-------+----------+----------+----------+--------+--------+  Mid         4.50   4.60      69        biphasic          fusiform  +-------------+-------+----------+----------+----------+--------+--------+  Distal      4.70   4.80      29        monophasic        fusiform  +-------------+-------+----------+----------+----------+--------+--------+  RT CIA Prox  5.3    6.1       233       biphasic  Present fusiform  +-------------+-------+----------+----------+----------+--------+--------+  RT CIA Mid                    51        biphasic                    +-------------+-------+----------+----------+----------+--------+--------+  RT CIA Distal                 140       biphasic                    +-------------+-------+----------+----------+----------+--------+--------+  RT EIA Prox  1.1    1.3       445       triphasic                   +-------------+-------+----------+----------+----------+--------+--------+  RT EIA Mid                    178       biphasic                    +-------------+-------+----------+----------+----------+--------+--------+  RT EIA Distal                 179       biphasic                    +-------------+-------+----------+----------+----------+--------+--------+  LT CIA Prox  3.4    3.3       44        biphasic  Present fusiform  +-------------+-------+----------+----------+----------+--------+--------+  LT CIA Mid                    145       biphasic                     +-------------+-------+----------+----------+----------+--------+--------+  LT CIA Distal                 76        biphasic                    +-------------+-------+----------+----------+----------+--------+--------+  LT EIA Prox  0.9    1.1       103       biphasic                    +-------------+-------+----------+----------+----------+--------+--------+  LT EIA Mid                    118       biphasic                    +-------------+-------+----------+----------+----------+--------+--------+  LT EIA Distal                 93        biphasic                    +-------------+-------+----------+----------+----------+--------+--------+   Bilateral common iliac arterial aneurysms, right > left.   IVC/Iliac Findings:  +--------+------+--------+--------+   IVC   PatentThrombusComments  +--------+------+--------+--------+  IVC Proxpatent                  +--------+------+--------+--------+     Findings reported to Shirline Frees, NP thru secure chat and Dr. Royann Shivers  (DOD) at 9:00 am .   Summary:  Abdominal Aorta: There is evidence of abnormal dilatation of the mid and  distal Abdominal aorta. There is evidence of abnormal dilation of the  Right Common Iliac artery 5.3 x 6.1 cm and Left Common Iliac artery 3.4 x  3.3 cm.. The largest aortic  measurement is 4.8 cm.  Stenosis: +--------------------+-------------+  Location            Stenosis       +--------------------+-------------+  Right Common Iliac  >50% stenosis  +--------------------+-------------+  Right External Iliac>50% stenosis  +--------------------+-------------+      IVC/Iliac: There is no evidence of thrombus involving the IVC.    *See table(s) above for measurements and observations.  Suggest Peripheral Vascular Consult. Dr. Royann Shivers sent note to [facility].    Electronically signed by Nanetta Batty MD on 04/04/2023 at 5:56:16 PM.   Assessment/Plan:  72  y.o. male, with hx COPD, CAD s/p STEMI, HTN, HLD, tobacco abuse that presents for evaluation of a 6.1 cm right common iliac artery aneurysm.  Discussed that his Aorta ultrasound showed a 4.8 cm abdominal aortic aneurysm with a 6.1 cm right common iliac artery aneurysm.  I discussed common iliac aneurysms are typically repaired greater than 3.5 cm and his aneurysm appears very large.  Fortunately he is having no symptoms and has no pain on exam.  I discussed we need to get a CTA abdomen pelvis to further evaluate his anatomy and surgical repair options at this size.  I discussed options of stent  graft repair versus open repair pending his cross-sectional imaging.  I will get a CTA ordered urgently through the  office given the size of his aneurysm.  I will see him back next Tuesday in the office to discuss options for repair.  All questions answered.   Cephus Shelling, MD [facility] Office: [phone]

## 2023-04-05 ENCOUNTER — Encounter: Payer: Self-pay | Admitting: Vascular Surgery

## 2023-04-05 ENCOUNTER — Ambulatory Visit: Payer: Medicare Other | Admitting: Vascular Surgery

## 2023-04-05 ENCOUNTER — Telehealth: Payer: Self-pay

## 2023-04-05 ENCOUNTER — Other Ambulatory Visit: Payer: Self-pay

## 2023-04-05 VITALS — BP 149/84 | HR 57 | Temp 98.1°F | Resp 16 | Ht 65.0 in | Wt 147.0 lb

## 2023-04-05 DIAGNOSIS — I723 Aneurysm of iliac artery: Secondary | ICD-10-CM | POA: Diagnosis not present

## 2023-04-05 DIAGNOSIS — I7143 Infrarenal abdominal aortic aneurysm, without rupture: Secondary | ICD-10-CM

## 2023-04-05 DIAGNOSIS — I7133 Infrarenal abdominal aortic aneurysm, ruptured: Secondary | ICD-10-CM

## 2023-04-11 ENCOUNTER — Ambulatory Visit (HOSPITAL_BASED_OUTPATIENT_CLINIC_OR_DEPARTMENT_OTHER)
Admission: RE | Admit: 2023-04-11 | Discharge: 2023-04-11 | Disposition: A | Discharge status: Home / Self Care | Payer: Medicare Other | Source: Ambulatory Visit | Attending: Vascular Surgery | Admitting: Vascular Surgery

## 2023-04-11 DIAGNOSIS — Z01818 Encounter for other preprocedural examination: Secondary | ICD-10-CM | POA: Diagnosis not present

## 2023-04-11 DIAGNOSIS — I723 Aneurysm of iliac artery: Secondary | ICD-10-CM | POA: Insufficient documentation

## 2023-04-11 DIAGNOSIS — I714 Abdominal aortic aneurysm, without rupture, unspecified: Secondary | ICD-10-CM | POA: Diagnosis not present

## 2023-04-11 DIAGNOSIS — I7143 Infrarenal abdominal aortic aneurysm, without rupture: Secondary | ICD-10-CM | POA: Insufficient documentation

## 2023-04-11 DIAGNOSIS — I7133 Infrarenal abdominal aortic aneurysm, ruptured: Secondary | ICD-10-CM | POA: Insufficient documentation

## 2023-04-11 MED ORDER — IOHEXOL 350 MG/ML SOLN
100.0000 mL | Freq: Once | INTRAVENOUS | Status: AC | PRN
  Administered 2023-04-11: 100 mL via INTRAVENOUS

## 2023-04-12 ENCOUNTER — Encounter: Payer: Self-pay | Admitting: Vascular Surgery

## 2023-04-12 ENCOUNTER — Ambulatory Visit: Payer: Medicare Other | Admitting: Vascular Surgery

## 2023-04-12 VITALS — BP 125/72 | HR 62 | Temp 97.9°F | Wt 146.0 lb

## 2023-04-12 DIAGNOSIS — I7143 Infrarenal abdominal aortic aneurysm, without rupture: Secondary | ICD-10-CM

## 2023-04-12 DIAGNOSIS — I723 Aneurysm of iliac artery: Secondary | ICD-10-CM | POA: Diagnosis not present

## 2023-04-12 NOTE — Progress Notes (Signed)
Patient name: Jeffrey Hudson MRN: [id number] DOB: 01/13/1951 Sex: male  REASON FOR CONSULT: F/U after CTA for 6.1 cm right common iliac aneurysm  HPI: Jeffrey Hudson is a 72 y.o. male, with hx COPD, CAD s/p STEMI, HTN, HLD, tobacco abuse that presents for follow-up after CTA for evaluation of a 6.1 cm right common iliac artery aneurysm.  Patient underwent ultrasound aorta Medicare screening on 04/03/2023.  There was evidence of a 4.8 cm abdominal aortic aneurysm with a 6.1 cm right common iliac artery aneurysm.  He was not having any abdominal or back pain.  He was down to smoking about 2 cigarettes a day but previously was smoking a pack a day for about 40 years.  No previous abdominal surgery.  Does have an umbilical hernia.  After initial evaluation last week I sent him for CTA and he presents for follow-up.    Past Medical History:  Diagnosis Date   AMI (acute myocardial infarction) (HCC)    Acute ST elevation   Anxiety    Arthritis    neck   Bipolar disorder (HCC)    COPD (chronic obstructive pulmonary disease) (HCC)    Coronary artery disease    a. s/p anterolateral STEMI with BMS placed in large diagonal branch (2009).   Emphysema of lung (HCC)    GERD (gastroesophageal reflux disease)    Glaucoma    Hx of adenomatous polyp of colon 05/06/2021   diminutive - no f/u needed   Hyperlipidemia    Hypertension    Nodule of left lung    6-mm smooothly rounded nodule   Plantar fasciitis    left foot   Pre-diabetes    Syncope and collapse    in 2001 and 2004 with no clear cause   Tobacco abuse    Viral URI with cough 11/22/2016    Past Surgical History:  Procedure Laterality Date   COLONOSCOPY     PERCUTANEOUS CORONARY STENT INTERVENTION (PCI-S)     UPPER GASTROINTESTINAL ENDOSCOPY      Family History  Problem Relation Age of Onset   Dementia Mother 21       died   Alcohol abuse Father 12   Depression Brother    Healthy Son    Colon cancer Neg Hx     Esophageal cancer Neg Hx    Rectal cancer Neg Hx    Stomach cancer Neg Hx     SOCIAL HISTORY: Social History   Socioeconomic History   Marital status: Married    Spouse name: Not on file   Number of children: 1   Years of education: Not on file   Highest education level: Bachelor's degree (e.g., BA, AB, BS)  Occupational History   Occupation: Retired-Sales/broker  Tobacco Use   Smoking status: Every Day    Packs/day: 1.00    Years: 54.00    Additional pack years: 0.00    Total pack years: 54.00    Types: Cigarettes    Start date: 1970   Smokeless tobacco: Never   Tobacco comments:    Patient states he's down to smoking 3 cigarettes a day as of 01/05/23 ep  Vaping Use   Vaping Use: Never used  Substance and Sexual Activity   Alcohol use: Yes    Comment: 2 drinks daily   Drug use: No    Comment: marijuana quit 2010. He has hx of THC use.   Sexual activity: Not on file  Other Topics Concern   Not  on file  Social History Narrative   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[employer]   He has been married for 35 years    One child (son)       He likes to [employer] - plays once a week    2 glasses of wine a day max, still smoking 2 cups of coffee a day no drug use or other tobacco   Social Determinants of Health   Financial Resource Strain: Low Risk  (07/06/2022)   Overall Financial Resource Strain (CARDIA)    Difficulty of Paying Living Expenses: Not hard at all  Food Insecurity: No Food Insecurity (07/06/2022)   Hunger Vital Sign    Worried About Running Out of Food in the Last Year: Never true    Ran Out of Food in the Last Year: Never true  Transportation Needs: No Transportation Needs (07/06/2022)   PRAPARE - Administrator, Civil Service (Medical): No    Lack of Transportation (Non-Medical): No  Physical Activity: Inactive (07/06/2022)   Exercise Vital Sign    Days of Exercise per Week: 0 days    Minutes of Exercise per  Session: 0 min  Stress: No Stress Concern Present (07/06/2022)   [employer] of Occupational Health - Occupational Stress Questionnaire    Feeling of Stress : Not at all  Social Connections: Moderately Isolated (12/01/2021)   Social Connection and Isolation Panel [NHANES]    Frequency of Communication with Friends and Family: Once a week    Frequency of Social Gatherings with Friends and Family: Once a week    Attends Religious Services: More than 4 times per year    Active Member of [employer] or Organizations: No    Attends Banker Meetings: Not on file    Marital Status: Married  Catering manager Violence: Not At Risk (06/02/2021)   Humiliation, Afraid, Rape, and Kick questionnaire    Fear of Current or Ex-Partner: No    Emotionally Abused: No    Physically Abused: No    Sexually Abused: No    Allergies  Allergen Reactions   Lisinopril Swelling    Angio edema    Metformin And Related Diarrhea   Norvasc [Amlodipine] Swelling    Lower extremity     Current Outpatient Medications  Medication Sig Dispense Refill   albuterol (PROAIR HFA) 108 (90 Base) MCG/ACT inhaler Inhale 2 puffs into the lungs every 6 (six) hours as needed. 1 Inhaler 3   aspirin 81 MG tablet Take 81 mg by mouth daily.     betamethasone dipropionate 0.05 % cream Apply 1 application  topically as needed (for eczema).     clobetasol ointment (TEMOVATE) 0.05 % Apply 1 application. topically 2 (two) times daily.     cyclobenzaprine (FLEXERIL) 10 MG tablet Take 10 mg by mouth daily.     doxazosin (CARDURA) 2 MG tablet Take 1 tablet (2 mg total) by mouth daily. 90 tablet 3   ezetimibe (ZETIA) 10 MG tablet Take 1 tablet (10 mg total) by mouth daily. 90 tablet 0   fluocinonide (LIDEX) 0.05 % external solution Apply 1 Application topically 2 (two) times daily.     FLUoxetine (PROZAC) 20 MG capsule Take 1 capsule (20 mg total) by mouth daily. 90 capsule 2   hydrochlorothiazide (HYDRODIURIL) 25 MG tablet Take  1 tablet (25 mg total) by mouth daily. 90 tablet 3   latanoprost (XALATAN) 0.005 % ophthalmic solution SMARTSIG:1 Drop(s) In Eye(s) Every  Evening     meloxicam (MOBIC) 7.5 MG tablet TAKE 1 TABLET BY MOUTH  DAILY 90 tablet 3   metoprolol succinate (TOPROL-XL) 25 MG 24 hr tablet Take 1 tablet (25 mg total) by mouth daily. 90 tablet 3   Multiple Vitamin (MULTIVITAMIN) tablet Take 1 tablet by mouth daily.     nitroGLYCERIN (NITROSTAT) 0.4 MG SL tablet Place 1 tablet (0.4 mg total) under the tongue every 5 (five) minutes as needed for chest pain. 25 tablet 11   pantoprazole (PROTONIX) 40 MG tablet TAKE 1 TABLET BY MOUTH DAILY  BEFORE BREAKFAST 90 tablet 3   rosuvastatin (CRESTOR) 40 MG tablet TAKE 1 TABLET BY MOUTH DAILY 90 tablet 3   timolol (BETIMOL) 0.25 % ophthalmic solution Place 1-2 drops into both eyes daily.     No current facility-administered medications for this visit.    REVIEW OF SYSTEMS:  [X]  denotes positive finding, [ ]  denotes negative finding Cardiac  Comments:  Chest pain or chest pressure:    Shortness of breath upon exertion:    Short of breath when lying flat:    Irregular heart rhythm:        Vascular    Pain in calf, thigh, or hip brought on by ambulation:    Pain in feet at night that wakes you up from your sleep:     Blood clot in your veins:    Leg swelling:         Pulmonary    Oxygen at home:    Productive cough:     Wheezing:         Neurologic    Sudden weakness in arms or legs:     Sudden numbness in arms or legs:     Sudden onset of difficulty speaking or slurred speech:    Temporary loss of vision in one eye:     Problems with dizziness:         Gastrointestinal    Blood in stool:     Vomited blood:         Genitourinary    Burning when urinating:     Blood in urine:        Psychiatric    Major depression:         Hematologic    Bleeding problems:    Problems with blood clotting too easily:        Skin    Rashes or ulcers:         Constitutional    Fever or chills:      PHYSICAL EXAM: Vitals:   04/12/23 1507  BP: 125/72  Pulse: 62  Temp: 97.9 F (36.6 C)  TempSrc: Temporal  SpO2: 95%  Weight: 146 lb (66.2 kg)    GENERAL: The patient is a well-nourished male, in no acute distress. The vital signs are documented above. CARDIAC: There is a regular rate and rhythm.  VASCULAR:  Bilateral femoral pulses palpable Bilateral DP/PT pulses palpable PULMONARY: No respiratory distress. ABDOMEN: Soft and non-tender.  No pain with deep palpation of aneurysm. MUSCULOSKELETAL: There are no major deformities or cyanosis. NEUROLOGIC: No focal weakness or paresthesias are detected. SKIN: There are no ulcers or rashes noted. PSYCHIATRIC: The patient has a normal affect.  DATA:   CTA reviewed with evidence of a 3.8 cm abdominal aortic aneurysm and a 5.4 cm right common iliac artery aneurysm  Assessment/Plan:  72 y.o. male, with hx COPD, CAD s/p STEMI in 2009, HTN, HLD, tobacco abuse that presents  for follow-up after CTA for further evaluation of a 6.1 cm right common iliac artery aneurysm identified on [place].  I sent him for CTA to further evaluate.  I reviewed the CT scan with the patient and his wife and discussed that I measure his right common iliac aneurysm at 5.4 cm.  Discussed that we repair iliac artery aneurysms at greater than 3.5 cm given risk of rupture.  He has associated abdominal aortic aneurysm as well that measures 3.8 cm.  I think he would be a candidate for stent graft repair as discussed today.  I have recommended an iliac branch endoprosthesis on the right for his iliac aneurysm bridged with abdominal aortic stent graft in the infrarenal aorta.  I offered to get my schedule rearranged to do this tomorrow but he states he needs some days to get his affairs in order.  I discussed I can have availability for 04/25/23 and will add to the schedule.  I discussed risk and benefits including risk of anesthesia, MI,  stroke, bleeding, vessel injury, conversion open etc. All questions answered.   Cephus Shelling, MD [facility] Office: [phone]

## 2023-04-13 ENCOUNTER — Other Ambulatory Visit: Payer: Self-pay

## 2023-04-13 DIAGNOSIS — I7143 Infrarenal abdominal aortic aneurysm, without rupture: Secondary | ICD-10-CM

## 2023-04-13 DIAGNOSIS — I723 Aneurysm of iliac artery: Secondary | ICD-10-CM

## 2023-04-19 ENCOUNTER — Other Ambulatory Visit: Payer: Self-pay | Admitting: Cardiovascular Disease

## 2023-04-19 NOTE — Pre-Procedure Instructions (Signed)
Surgical Instructions    Your procedure is scheduled on Monday, July 8th.  Report to [facility] Main Entrance "A" at 08:15 A.M., then check in with the Admitting office.  Call this number if you have problems the morning of surgery:  [phone]  If you have any questions prior to your surgery date call [phone]: Open Monday-Friday 8am-4pm If you experience any cold or flu symptoms such as cough, fever, chills, shortness of breath, etc. between now and your scheduled surgery, please notify us at the above number.     Remember:  Do not eat or drink after midnight the night before your surgery     Take these medicines the morning of surgery with A SIP OF WATER  aspirin  doxazosin (CARDURA)  ezetimibe (ZETIA)  FLUoxetine (PROZAC)  metoprolol succinate (TOPROL-XL)  pantoprazole (PROTONIX)  rosuvastatin (CRESTOR)  timolol (BETIMOL) eye drops  If needed: albuterol (PROAIR HFA)- bring inhaler with you on day of surgery nitroGLYCERIN (NITROSTAT)- If you have to use this medication prior to surgery, please call us at one of the above phone numbers and report this to a nurse oxymetazoline (AFRIN) nasal spray   As of today, STOP taking any Aspirin (unless otherwise instructed by your surgeon) Aleve, Naproxen, Ibuprofen, Motrin, Advil, Goody's, BC's, meloxicam (MOBIC), diclofenac Sodium (VOLTAREN) gel, all herbal medications, fish oil, and all vitamins.                     Do NOT Smoke (Tobacco/Vaping) for 24 hours prior to your procedure.  If you use a CPAP at night, you may bring your mask/headgear for your overnight stay.   Contacts, glasses, piercing's, hearing aid's, dentures or partials may not be worn into surgery, please bring cases for these belongings.    For patients admitted to the hospital, discharge time will be determined by your treatment team.   Patients discharged the day of surgery will not be allowed to drive home, and someone needs to stay with them for 24  hours.  SURGICAL WAITING ROOM VISITATION Patients having surgery or a procedure may have no more than 2 support people in the waiting area - these visitors may rotate.   Children under the age of 25 must have an adult with them who is not the patient. If the patient needs to stay at the hospital during part of their recovery, the visitor guidelines for inpatient rooms apply. Pre-op nurse will coordinate an appropriate time for 1 support person to accompany patient in pre-op.  This support person may not rotate.   Please refer to the [facility] website for the visitor guidelines for Inpatients (after your surgery is over and you are in a regular room).    Special instructions:   [place]- Preparing For Surgery  Before surgery, you can play an important role. Because skin is not sterile, your skin needs to be as free of germs as possible. You can reduce the number of germs on your skin by washing with CHG (chlorahexidine gluconate) Soap before surgery.  CHG is an antiseptic cleaner which kills germs and bonds with the skin to continue killing germs even after washing.    Oral Hygiene is also important to reduce your risk of infection.  Remember - BRUSH YOUR TEETH THE MORNING OF SURGERY WITH YOUR REGULAR TOOTHPASTE  Please do not use if you have an allergy to CHG or antibacterial soaps. If your skin becomes reddened/irritated stop using the CHG.  Do not shave (including legs and  underarms) for at least 48 hours prior to first CHG shower. It is OK to shave your face.  Please follow these instructions carefully.   Shower the NIGHT BEFORE SURGERY and the MORNING OF SURGERY  If you chose to wash your hair, wash your hair first as usual with your normal shampoo.  After you shampoo, rinse your hair and body thoroughly to remove the shampoo.  Use CHG Soap as you would any other liquid soap. You can apply CHG directly to the skin and wash gently with a scrungie or a clean washcloth.   Apply  the CHG Soap to your body ONLY FROM THE NECK DOWN.  Do not use on open wounds or open sores. Avoid contact with your eyes, ears, mouth and genitals (private parts). Wash Face and genitals (private parts)  with your normal soap.   Wash thoroughly, paying special attention to the area where your surgery will be performed.  Thoroughly rinse your body with warm water from the neck down.  DO NOT shower/wash with your normal soap after using and rinsing off the CHG Soap.  Pat yourself dry with a CLEAN TOWEL.  Wear CLEAN PAJAMAS to bed the night before surgery  Place CLEAN SHEETS on your bed the night before your surgery  DO NOT SLEEP WITH PETS.   Day of Surgery: Take a shower with CHG soap. Do not wear jewelry or makeup Do not wear lotions, powders, perfumes/colognes, or deodorant. Do not shave 48 hours prior to surgery.  Men may shave face and neck. Do not bring valuables to the hospital.  [facility] is not responsible for any belongings or valuables. Do not wear nail polish, gel polish, artificial nails, or any other type of covering on natural nails (fingers and toes) If you have artificial nails or gel coating that need to be removed by a nail salon, please have this removed prior to surgery. Artificial nails or gel coating may interfere with anesthesia's ability to adequately monitor your vital signs. Wear Clean/Comfortable clothing the morning of surgery Remember to brush your teeth WITH YOUR REGULAR TOOTHPASTE.   Please read over the following fact sheets that you were given.    If you received a COVID test during your pre-op visit  it is requested that you wear a mask when out in public, stay away from anyone that may not be feeling well and notify your surgeon if you develop symptoms. If you have been in contact with anyone that has tested positive in the last 10 days please notify you surgeon.

## 2023-04-19 NOTE — Addendum Note (Signed)
Addended by: Primitivo Gauze on: 04/19/2023 12:42 PM   Modules accepted: Orders

## 2023-04-20 ENCOUNTER — Encounter (HOSPITAL_COMMUNITY): Payer: Self-pay

## 2023-04-20 ENCOUNTER — Other Ambulatory Visit: Payer: Self-pay

## 2023-04-20 ENCOUNTER — Encounter (HOSPITAL_COMMUNITY)
Admission: RE | Admit: 2023-04-20 | Discharge: 2023-04-20 | Disposition: A | Discharge status: Home / Self Care | Payer: Medicare Other | Source: Ambulatory Visit | Attending: Vascular Surgery | Admitting: Vascular Surgery

## 2023-04-20 VITALS — BP 145/86 | HR 55 | Temp 98.2°F | Resp 18 | Ht 65.0 in | Wt 149.0 lb

## 2023-04-20 DIAGNOSIS — Z7901 Long term (current) use of anticoagulants: Secondary | ICD-10-CM | POA: Insufficient documentation

## 2023-04-20 DIAGNOSIS — E785 Hyperlipidemia, unspecified: Secondary | ICD-10-CM | POA: Diagnosis not present

## 2023-04-20 DIAGNOSIS — K5730 Diverticulosis of large intestine without perforation or abscess without bleeding: Secondary | ICD-10-CM | POA: Insufficient documentation

## 2023-04-20 DIAGNOSIS — I700 Atherosclerosis of aorta: Secondary | ICD-10-CM | POA: Diagnosis not present

## 2023-04-20 DIAGNOSIS — J439 Emphysema, unspecified: Secondary | ICD-10-CM | POA: Insufficient documentation

## 2023-04-20 DIAGNOSIS — Z01818 Encounter for other preprocedural examination: Secondary | ICD-10-CM | POA: Diagnosis not present

## 2023-04-20 DIAGNOSIS — I7143 Infrarenal abdominal aortic aneurysm, without rupture: Secondary | ICD-10-CM | POA: Insufficient documentation

## 2023-04-20 DIAGNOSIS — I723 Aneurysm of iliac artery: Secondary | ICD-10-CM | POA: Insufficient documentation

## 2023-04-20 DIAGNOSIS — H409 Unspecified glaucoma: Secondary | ICD-10-CM | POA: Diagnosis not present

## 2023-04-20 DIAGNOSIS — I10 Essential (primary) hypertension: Secondary | ICD-10-CM | POA: Diagnosis not present

## 2023-04-20 DIAGNOSIS — K219 Gastro-esophageal reflux disease without esophagitis: Secondary | ICD-10-CM | POA: Insufficient documentation

## 2023-04-20 DIAGNOSIS — I252 Old myocardial infarction: Secondary | ICD-10-CM | POA: Diagnosis not present

## 2023-04-20 DIAGNOSIS — I2510 Atherosclerotic heart disease of native coronary artery without angina pectoris: Secondary | ICD-10-CM | POA: Diagnosis not present

## 2023-04-20 DIAGNOSIS — F319 Bipolar disorder, unspecified: Secondary | ICD-10-CM | POA: Insufficient documentation

## 2023-04-20 HISTORY — DX: Depression, unspecified: F32.A

## 2023-04-20 HISTORY — DX: Abdominal aortic aneurysm, without rupture, unspecified: I71.40

## 2023-04-20 HISTORY — DX: Personal history of urinary calculi: Z87.442

## 2023-04-20 LAB — COMPREHENSIVE METABOLIC PANEL
ALT: 34 U/L (ref 0–44)
AST: 35 U/L (ref 15–41)
Albumin: 3.7 g/dL (ref 3.5–5.0)
Alkaline Phosphatase: 58 U/L (ref 38–126)
Anion gap: 10 (ref 5–15)
BUN: 15 mg/dL (ref 8–23)
CO2: 27 mmol/L (ref 22–32)
Calcium: 9.5 mg/dL (ref 8.9–10.3)
Chloride: 100 mmol/L (ref 98–111)
Creatinine, Ser: 1 mg/dL (ref 0.61–1.24)
GFR, Estimated: >60 mL/min (ref >60)
Glucose, Bld: 121 mg/dL — ABNORMAL HIGH (ref 70–99)
Potassium: 3.9 mmol/L (ref 3.5–5.1)
Sodium: 137 mmol/L (ref 135–145)
Total Bilirubin: 1.5 mg/dL — ABNORMAL HIGH (ref 0.3–1.2)
Total Protein: 6.3 g/dL — ABNORMAL LOW (ref 6.5–8.1)

## 2023-04-20 LAB — CBC
HCT: 40.4 % (ref 39.0–52.0)
Hemoglobin: 13.9 g/dL (ref 13.0–17.0)
MCH: 32.6 pg (ref 26.0–34.0)
MCHC: 34.4 g/dL (ref 30.0–36.0)
MCV: 94.8 fL (ref 80.0–100.0)
Platelets: 201 10*3/uL (ref 150–400)
RBC: 4.26 MIL/uL (ref 4.22–5.81)
RDW: 12.1 % (ref 11.5–15.5)
WBC: 6.4 10*3/uL (ref 4.0–10.5)
nRBC: 0 % (ref 0.0–0.2)

## 2023-04-20 LAB — SURGICAL PCR SCREEN
MRSA, PCR: NEGATIVE
Staphylococcus aureus: NEGATIVE

## 2023-04-20 LAB — APTT: aPTT: 28 seconds (ref 24–36)

## 2023-04-20 LAB — URINALYSIS, ROUTINE W REFLEX MICROSCOPIC
Bacteria, UA: NONE SEEN
Bilirubin Urine: NEGATIVE
Glucose, UA: NEGATIVE mg/dL
Hgb urine dipstick: NEGATIVE
Ketones, ur: NEGATIVE mg/dL
Leukocytes,Ua: NEGATIVE
Nitrite: NEGATIVE
Protein, ur: 100 mg/dL — AB
Specific Gravity, Urine: 1.014 (ref 1.005–1.030)
pH: 6 (ref 5.0–8.0)

## 2023-04-20 LAB — TYPE AND SCREEN
ABO/RH(D): O NEG
Antibody Screen: NEGATIVE

## 2023-04-20 LAB — PROTIME-INR
INR: 0.9 (ref 0.8–1.2)
Prothrombin Time: 12.6 seconds (ref 11.4–15.2)

## 2023-04-20 NOTE — Progress Notes (Signed)
PCP - Shirline Frees, NP Cardiologist - Dr. Verne Carrow  PPM/ICD - denies  Chest x-ray - 01/28/21 EKG - 03/15/23 Stress Test - 12/20/14 ECHO - 06/25/09 Cardiac Cath - 02/11/10  Sleep Study - denies   DM- denies  Blood Thinner Instructions: n/a Aspirin Instructions: continue on DOS  ERAS Protcol - no, NPO   COVID TEST- n/a   Anesthesia review: yes, cardiac hx  Patient denies shortness of breath, fever, cough and chest pain at PAT appointment   All instructions explained to the patient, with a verbal understanding of the material. Patient agrees to go over the instructions while at home for a better understanding. The opportunity to ask questions was provided.

## 2023-04-22 NOTE — Progress Notes (Signed)
Anesthesia Chart Review:  Case: [id number] Date/Time: 04/25/23 0957   Procedure: ABDOMINAL AORTIC ENDOVASCULAR STENT GRAFT WITH RIGHT ILIAC BRANCH ENDOPROSTHESIS   Anesthesia type: General   Pre-op diagnosis: Iliac artery aneurysm, Infrarenal abdominal aortic aneurysm without rupture   Location: [facility] OR ROOM 16 / [facility] OR   Surgeons: Cephus Shelling, MD       DISCUSSION: Patient is a 72 year old male scheduled for the above procedure. He has a 4.1 cm AAA, 3.6 cm left CIA aneurysm, and large 5.5 cm right CIA aneurysm.  History includes smoking, CAD (anterolateral STEMI, s/p BMS DIAG stent ~ 06/2009), HTN, HLD, AAA with iliac aneurysms, COPD/emphysema, lung nodule (stable bilateral pulmonary nodules 09/29/22 CT), syncope (2001, 2004, unclear etiology), pre-diabetes, GERD, glaucoma, Bipolar disorder. Alcohol intake recorded as 14 standard drinks per week.   Last cardiology evaluation was on 03/15/23 by [name], PA-C. No angina. Continue ASA, Crestor. Doxazosin increased to 2 mg daily to improve BP control. He had pending follow-up AAA [place] planned. 3 months follow-up planned.   Recent cardiology follow-up. BP 145/86.   Anesthesia team to evaluate on the day of surgery.   VS: BP (!) 145/86   Pulse (!) 55   Temp 36.8 C   Resp 18   Ht 5\' 5"  (1.651 m)   Wt 67.6 kg   SpO2 98%   BMI 24.79 kg/m     PROVIDERS: Shirline Frees, NP is PCP  Verne Carrow, MD is cardiologist Cyril Mourning, MD is pulmonologist   LABS: Labs reviewed: Acceptable for surgery.  A1c 5.9% 07/28/21. (all labs ordered are listed, but only abnormal results are displayed)  Labs Reviewed  COMPREHENSIVE METABOLIC PANEL - Abnormal; Notable for the following components:      Result Value   Glucose, Bld 121 (*)    Total Protein 6.3 (*)    Total Bilirubin 1.5 (*)    All other components within normal limits  URINALYSIS, ROUTINE W REFLEX MICROSCOPIC - Abnormal; Notable for the following components:   Protein,  ur 100 (*)    All other components within normal limits  SURGICAL PCR SCREEN  CBC  PROTIME-INR  APTT  TYPE AND SCREEN    PFTs 01/06/23: FVC 3.02 (86%), post 3.37 (96%). FEV1 1.92 (76%), post 2.23 (88%), DLCO unc/cor 15.90 (73%). - Per Dr. Vassie Loll, "Lung function was at 76% -but improved to 88% with albuterol -This is behaving more like asthma than COPD -It is okay for him to stay off Bevespi, he can use albuterol on an as-needed basis"   IMAGES: CTA Abd/pelvis 04/11/23: IMPRESSION: VASCULAR IMPRESSION: 1. Fusiform aneurysmal dilatation of the infrarenal abdominal aorta measuring 4.1 cm in maximal diameter. Aortic aneurysm NOS (ICD10-I71.9). 2. Bilateral common iliac artery saccular aneurysms, the right measuring 5.5 cm, the left measuring 3.6 cm. There is a moderate amount mural thrombus within both aneurysms, right-greater-than-left, not resulting in a hemodynamically significant stenosis. No perivascular stranding. 3.  Aortic Atherosclerosis (ICD10-I70.0).   NON-VASCULAR IMPRESSION:  1. Extensive colonic diverticulosis without evidence of diverticulitis. 2. Mild nodularity of the hepatic contour, nonspecific though could be seen in the setting of cirrhosis. Correlation with LFTs is advised.    CT Chest LCS 09/29/22: - Scattered bilateral pulmonary nodules identified previously are stable in the interval. IMPRESSION: - Lung-RADS 2, benign appearance or behavior. Continue annual screening with low-dose chest CT without contrast in 12 months. - Emphysema (ICD10-J43.9) and Aortic Atherosclerosis (ICD10-170.0)   EKG: 03/15/23: SB at 57 bpm. LAD.   CV: Nuclear stress  test 12/19/14: IMPRESSION: Normal stress nuclear study. LV Ejection Fraction: 64%.  LV Wall Motion:  NL LV Function; NL Wall Motion      Cardiac cath 02/10/10: 1.  Preserved left ventricular function. 2.  Scattered disease is noted [LM free of critical disease; LAD with mild luminal irregularities ~ 20%; DIAG  previously stented with 40-50% segmental narrowing and just proximal to the stent; 50% LCA after the OM2 takeoff; distal RCA 30%; 60% PLA] with mild restenosis of a very small caliber stent in the diagonal at the bifurcation point. There was excellent TIMI III flow in this area.  Smoking cessation strongly recommended.  Decision made not to dilate the stent again as it was noted to be very small in caliber.  Continue medical therapy is warranted.   Echo 06/25/09: Study Conclusions   Left ventricle: The cavity size was normal. The estimated ejection   fraction was 55%. There is hypokinesis of the mid-distal   anterolateral myocardium. Features are consistent with a   pseudonormal left ventricular filling pattern, with concomitant   abnormal relaxation and increased filling pressure (grade 2   diastolic dysfunction).      Past Medical History:  Diagnosis Date   AAA (abdominal aortic aneurysm) (HCC)    AMI (acute myocardial infarction) (HCC)    Acute ST elevation   Anxiety    Arthritis    neck   Bipolar disorder (HCC)    COPD (chronic obstructive pulmonary disease) (HCC)    Coronary artery disease    a. s/p anterolateral STEMI with BMS placed in large diagonal branch (2009).   Depression    Emphysema of lung (HCC)    GERD (gastroesophageal reflux disease)    Glaucoma    History of kidney stones    Hx of adenomatous polyp of colon 05/06/2021   diminutive - no f/u needed   Hyperlipidemia    Hypertension    Nodule of left lung    6-mm smooothly rounded nodule   Plantar fasciitis    left foot   Pre-diabetes    Syncope and collapse    in 2001 and 2004 with no clear cause   Tobacco abuse    Viral URI with cough 11/22/2016    Past Surgical History:  Procedure Laterality Date   COLONOSCOPY     PERCUTANEOUS CORONARY STENT INTERVENTION (PCI-S)     TONSILLECTOMY     removed as a child, also removed uvula   UPPER GASTROINTESTINAL ENDOSCOPY      MEDICATIONS:  albuterol (PROAIR  HFA) 108 (90 Base) MCG/ACT inhaler   aspirin 81 MG tablet   betamethasone dipropionate 0.05 % cream   clobetasol ointment (TEMOVATE) 0.05 %   diclofenac Sodium (VOLTAREN) 1 % GEL   doxazosin (CARDURA) 2 MG tablet   ezetimibe (ZETIA) 10 MG tablet   fluocinonide (LIDEX) 0.05 % external solution   FLUoxetine (PROZAC) 20 MG capsule   hydrochlorothiazide (HYDRODIURIL) 25 MG tablet   latanoprost (XALATAN) 0.005 % ophthalmic solution   meloxicam (MOBIC) 7.5 MG tablet   metoprolol succinate (TOPROL-XL) 25 MG 24 hr tablet   Multiple Vitamin (MULTIVITAMIN) tablet   nitroGLYCERIN (NITROSTAT) 0.4 MG SL tablet   oxymetazoline (AFRIN) 0.05 % nasal spray   pantoprazole (PROTONIX) 40 MG tablet   rosuvastatin (CRESTOR) 40 MG tablet   timolol (BETIMOL) 0.5 % ophthalmic solution   No current facility-administered medications for this encounter.    Shonna Chock, PA-C Surgical Short Stay/Anesthesiology [facility] Phone [phone] [facility] Phone [phone] 04/22/2023 9:27  AM

## 2023-04-22 NOTE — Anesthesia Preprocedure Evaluation (Signed)
Anesthesia Evaluation  Patient identified by MRN, date of birth, ID band Patient awake    Reviewed: Allergy & Precautions, NPO status , Patient's Chart, lab work & pertinent test results  Airway Mallampati: I  TM Distance: >3 FB Neck ROM: Full    Dental  (+) Edentulous Upper, Dental Advisory Given, Missing   Pulmonary sleep apnea , COPD, Current Smoker and Patient abstained from smoking.   breath sounds clear to auscultation       Cardiovascular hypertension, Pt. on medications and Pt. on home beta blockers + CAD and + Past MI   Rhythm:Regular Rate:Normal  Myocardial Study: 1. Negative for pharmacologic-stress induced ischemia.   2. Left ventricular ejection fraction   64%.     Neuro/Psych  PSYCHIATRIC DISORDERS Anxiety Depression Bipolar Disorder      GI/Hepatic Neg liver ROS,GERD  Medicated,,  Endo/Other  negative endocrine ROS    Renal/GU negative Renal ROS     Musculoskeletal  (+) Arthritis ,    Abdominal   Peds  Hematology negative hematology ROS (+)   Anesthesia Other Findings   Reproductive/Obstetrics                             Anesthesia Physical Anesthesia Plan  ASA: 3  Anesthesia Plan: General   Post-op Pain Management: Tylenol PO (pre-op)*   Induction: Intravenous  PONV Risk Score and Plan: 2 and Ondansetron, Dexamethasone and Treatment may vary due to age or medical condition  Airway Management Planned: Oral ETT  Additional Equipment: Arterial line  Intra-op Plan:   Post-operative Plan: Extubation in OR  Informed Consent: I have reviewed the patients History and Physical, chart, labs and discussed the procedure including the risks, benefits and alternatives for the proposed anesthesia with the patient or authorized representative who has indicated his/her understanding and acceptance.     Dental advisory given  Plan Discussed with: CRNA  Anesthesia Plan  Comments: (PAT note written 04/22/2023 by Shonna Chock, PA-C.  )       Anesthesia Quick Evaluation

## 2023-04-22 NOTE — Telephone Encounter (Signed)
Encounter opened in error

## 2023-04-25 ENCOUNTER — Encounter (HOSPITAL_COMMUNITY)
Admission: RE | Disposition: A | Discharge status: Home / Self Care | Payer: Self-pay | Source: Home / Self Care | Attending: Vascular Surgery

## 2023-04-25 ENCOUNTER — Inpatient Hospital Stay (HOSPITAL_COMMUNITY): Payer: Medicare Other

## 2023-04-25 ENCOUNTER — Other Ambulatory Visit: Payer: Self-pay

## 2023-04-25 ENCOUNTER — Encounter (HOSPITAL_COMMUNITY): Payer: Self-pay | Admitting: Vascular Surgery

## 2023-04-25 ENCOUNTER — Inpatient Hospital Stay (HOSPITAL_COMMUNITY)
Admission: RE | Admit: 2023-04-25 | Discharge: 2023-04-26 | DRG: 269 | Disposition: A | Discharge status: Home / Self Care | Payer: Medicare Other | Attending: Vascular Surgery | Admitting: Vascular Surgery

## 2023-04-25 ENCOUNTER — Inpatient Hospital Stay (HOSPITAL_COMMUNITY): Payer: Medicare Other | Admitting: Vascular Surgery

## 2023-04-25 DIAGNOSIS — I252 Old myocardial infarction: Secondary | ICD-10-CM | POA: Diagnosis not present

## 2023-04-25 DIAGNOSIS — Z87442 Personal history of urinary calculi: Secondary | ICD-10-CM | POA: Diagnosis not present

## 2023-04-25 DIAGNOSIS — K219 Gastro-esophageal reflux disease without esophagitis: Secondary | ICD-10-CM | POA: Diagnosis not present

## 2023-04-25 DIAGNOSIS — I714 Abdominal aortic aneurysm, without rupture, unspecified: Secondary | ICD-10-CM | POA: Diagnosis not present

## 2023-04-25 DIAGNOSIS — I723 Aneurysm of iliac artery: Secondary | ICD-10-CM | POA: Diagnosis not present

## 2023-04-25 DIAGNOSIS — I10 Essential (primary) hypertension: Secondary | ICD-10-CM

## 2023-04-25 DIAGNOSIS — Z7982 Long term (current) use of aspirin: Secondary | ICD-10-CM | POA: Diagnosis not present

## 2023-04-25 DIAGNOSIS — Z9889 Other specified postprocedural states: Secondary | ICD-10-CM | POA: Diagnosis not present

## 2023-04-25 DIAGNOSIS — E785 Hyperlipidemia, unspecified: Secondary | ICD-10-CM | POA: Diagnosis present

## 2023-04-25 DIAGNOSIS — F17210 Nicotine dependence, cigarettes, uncomplicated: Secondary | ICD-10-CM | POA: Diagnosis not present

## 2023-04-25 DIAGNOSIS — F32A Depression, unspecified: Secondary | ICD-10-CM | POA: Diagnosis present

## 2023-04-25 DIAGNOSIS — Z79899 Other long term (current) drug therapy: Secondary | ICD-10-CM

## 2023-04-25 DIAGNOSIS — Z86010 Personal history of colonic polyps: Secondary | ICD-10-CM

## 2023-04-25 DIAGNOSIS — I2510 Atherosclerotic heart disease of native coronary artery without angina pectoris: Secondary | ICD-10-CM

## 2023-04-25 DIAGNOSIS — H409 Unspecified glaucoma: Secondary | ICD-10-CM | POA: Diagnosis not present

## 2023-04-25 DIAGNOSIS — K429 Umbilical hernia without obstruction or gangrene: Secondary | ICD-10-CM | POA: Diagnosis not present

## 2023-04-25 DIAGNOSIS — J439 Emphysema, unspecified: Secondary | ICD-10-CM | POA: Diagnosis present

## 2023-04-25 DIAGNOSIS — Z955 Presence of coronary angioplasty implant and graft: Secondary | ICD-10-CM | POA: Diagnosis not present

## 2023-04-25 DIAGNOSIS — G4733 Obstructive sleep apnea (adult) (pediatric): Secondary | ICD-10-CM | POA: Diagnosis not present

## 2023-04-25 HISTORY — PX: ABDOMINAL AORTIC ENDOVASCULAR STENT GRAFT: SHX5707

## 2023-04-25 HISTORY — PX: ULTRASOUND GUIDANCE FOR VASCULAR ACCESS: SHX6516

## 2023-04-25 HISTORY — PX: ABDOMINAL SURGERY: SHX537

## 2023-04-25 LAB — CBC
HCT: 30.8 % — ABNORMAL LOW (ref 39.0–52.0)
Hemoglobin: 10.3 g/dL — ABNORMAL LOW (ref 13.0–17.0)
MCH: 33.6 pg (ref 26.0–34.0)
MCHC: 33.4 g/dL (ref 30.0–36.0)
MCV: 100.3 fL — ABNORMAL HIGH (ref 80.0–100.0)
Platelets: 144 10*3/uL — ABNORMAL LOW (ref 150–400)
RBC: 3.07 MIL/uL — ABNORMAL LOW (ref 4.22–5.81)
RDW: 12.5 % (ref 11.5–15.5)
WBC: 6.5 10*3/uL (ref 4.0–10.5)
nRBC: 0 % (ref 0.0–0.2)

## 2023-04-25 LAB — BASIC METABOLIC PANEL
Anion gap: 8 (ref 5–15)
BUN: 15 mg/dL (ref 8–23)
CO2: 23 mmol/L (ref 22–32)
Calcium: 8.1 mg/dL — ABNORMAL LOW (ref 8.9–10.3)
Chloride: 106 mmol/L (ref 98–111)
Creatinine, Ser: 0.95 mg/dL (ref 0.61–1.24)
GFR, Estimated: >60 mL/min (ref >60)
Glucose, Bld: 101 mg/dL — ABNORMAL HIGH (ref 70–99)
Potassium: 3.7 mmol/L (ref 3.5–5.1)
Sodium: 137 mmol/L (ref 135–145)

## 2023-04-25 LAB — PROTIME-INR
INR: 1.1 (ref 0.8–1.2)
Prothrombin Time: 14.9 seconds (ref 11.4–15.2)

## 2023-04-25 LAB — ABO/RH: ABO/RH(D): O NEG

## 2023-04-25 LAB — POCT ACTIVATED CLOTTING TIME
Activated Clotting Time: 226 seconds
Activated Clotting Time: 244 seconds
Activated Clotting Time: 256 seconds
Activated Clotting Time: 244 seconds

## 2023-04-25 LAB — APTT: aPTT: 36 seconds (ref 24–36)

## 2023-04-25 LAB — MAGNESIUM: Magnesium: 1.5 mg/dL — ABNORMAL LOW (ref 1.7–2.4)

## 2023-04-25 SURGERY — INSERTION, ENDOVASCULAR STENT GRAFT, AORTA, ABDOMINAL
Anesthesia: General | Site: Groin | Laterality: Bilateral

## 2023-04-25 MED ORDER — CEFAZOLIN SODIUM-DEXTROSE 2-4 GM/100ML-% IV SOLN
2.0000 g | Freq: Three times a day (TID) | INTRAVENOUS | Status: AC
  Administered 2023-04-25 – 2023-04-26 (×2): 2 g via INTRAVENOUS
  Filled 2023-04-25 (×2): qty 100

## 2023-04-25 MED ORDER — 0.9 % SODIUM CHLORIDE (POUR BTL) OPTIME
TOPICAL | Status: DC | PRN
  Administered 2023-04-25: 1000 mL

## 2023-04-25 MED ORDER — METOPROLOL TARTRATE 5 MG/5ML IV SOLN: 2.0000 mg | INTRAVENOUS | Status: DC | PRN

## 2023-04-25 MED ORDER — ALUM & MAG HYDROXIDE-SIMETH 200-200-20 MG/5ML PO SUSP: 15.0000 mL | ORAL | Status: DC | PRN

## 2023-04-25 MED ORDER — ASPIRIN 81 MG PO CHEW
81.0000 mg | CHEWABLE_TABLET | Freq: Every day | ORAL | Status: DC
  Administered 2023-04-25 – 2023-04-26 (×2): 81 mg via ORAL
  Filled 2023-04-25 (×2): qty 1

## 2023-04-25 MED ORDER — FLUOCINONIDE 0.05 % EX SOLN: 1.0000 | Freq: Two times a day (BID) | CUTANEOUS | Status: DC | PRN

## 2023-04-25 MED ORDER — FLUOXETINE HCL 20 MG PO CAPS
20.0000 mg | ORAL_CAPSULE | Freq: Every day | ORAL | Status: DC
  Administered 2023-04-26: 20 mg via ORAL
  Filled 2023-04-25: qty 1

## 2023-04-25 MED ORDER — ACETAMINOPHEN 650 MG RE SUPP: 325.0000 mg | RECTAL | Status: DC | PRN

## 2023-04-25 MED ORDER — LACTATED RINGERS IV SOLN: INTRAVENOUS | Status: DC

## 2023-04-25 MED ORDER — FENTANYL CITRATE (PF) 250 MCG/5ML IJ SOLN
INTRAMUSCULAR | Status: AC
  Filled 2023-04-25: qty 5

## 2023-04-25 MED ORDER — METOPROLOL SUCCINATE ER 25 MG PO TB24
25.0000 mg | ORAL_TABLET | Freq: Every day | ORAL | Status: DC
  Administered 2023-04-26: 25 mg via ORAL
  Filled 2023-04-25: qty 1

## 2023-04-25 MED ORDER — PHENYLEPHRINE HCL-NACL 20-0.9 MG/250ML-% IV SOLN
INTRAVENOUS | Status: DC | PRN
  Administered 2023-04-25: 20 ug/min via INTRAVENOUS

## 2023-04-25 MED ORDER — HEPARIN 6000 UNIT IRRIGATION SOLUTION
Status: AC
  Filled 2023-04-25: qty 500

## 2023-04-25 MED ORDER — CEFAZOLIN SODIUM-DEXTROSE 2-4 GM/100ML-% IV SOLN
INTRAVENOUS | Status: AC
  Filled 2023-04-25: qty 100

## 2023-04-25 MED ORDER — OXYCODONE-ACETAMINOPHEN 5-325 MG PO TABS
1.0000 | ORAL_TABLET | ORAL | Status: DC | PRN
  Administered 2023-04-25: 1 via ORAL
  Filled 2023-04-25: qty 1

## 2023-04-25 MED ORDER — HEPARIN SODIUM (PORCINE) 1000 UNIT/ML IJ SOLN
INTRAMUSCULAR | Status: DC | PRN
  Administered 2023-04-25: 2000 [IU] via INTRAVENOUS
  Administered 2023-04-25: 3000 [IU] via INTRAVENOUS
  Administered 2023-04-25: 7000 [IU] via INTRAVENOUS

## 2023-04-25 MED ORDER — SUGAMMADEX SODIUM 200 MG/2ML IV SOLN
INTRAVENOUS | Status: DC | PRN
  Administered 2023-04-25: 200 mg via INTRAVENOUS

## 2023-04-25 MED ORDER — LIDOCAINE 2% (20 MG/ML) 5 ML SYRINGE
INTRAMUSCULAR | Status: DC | PRN
  Administered 2023-04-25: 70 mg via INTRAVENOUS

## 2023-04-25 MED ORDER — EPHEDRINE SULFATE-NACL 50-0.9 MG/10ML-% IV SOSY
PREFILLED_SYRINGE | INTRAVENOUS | Status: DC | PRN
  Administered 2023-04-25: 5 mg via INTRAVENOUS

## 2023-04-25 MED ORDER — DOCUSATE SODIUM 100 MG PO CAPS
100.0000 mg | ORAL_CAPSULE | Freq: Every day | ORAL | Status: DC
  Administered 2023-04-26: 100 mg via ORAL
  Filled 2023-04-25: qty 1

## 2023-04-25 MED ORDER — CHLORHEXIDINE GLUCONATE CLOTH 2 % EX PADS: 6.0000 | MEDICATED_PAD | Freq: Once | CUTANEOUS | Status: DC

## 2023-04-25 MED ORDER — PHENYLEPHRINE HCL-NACL 20-0.9 MG/250ML-% IV SOLN
INTRAVENOUS | Status: AC
  Filled 2023-04-25: qty 250

## 2023-04-25 MED ORDER — PROTAMINE SULFATE 10 MG/ML IV SOLN
INTRAVENOUS | Status: DC | PRN
  Administered 2023-04-25: 5 mg via INTRAVENOUS
  Administered 2023-04-25: 45 mg via INTRAVENOUS

## 2023-04-25 MED ORDER — FENTANYL CITRATE (PF) 100 MCG/2ML IJ SOLN: 25.0000 ug | INTRAMUSCULAR | Status: DC | PRN

## 2023-04-25 MED ORDER — ACETAMINOPHEN 10 MG/ML IV SOLN: 1000.0000 mg | Freq: Once | INTRAVENOUS | Status: DC | PRN

## 2023-04-25 MED ORDER — ACETAMINOPHEN 325 MG PO TABS: 325.0000 mg | ORAL_TABLET | Freq: Once | ORAL | Status: DC | PRN

## 2023-04-25 MED ORDER — PROPOFOL 10 MG/ML IV BOLUS
INTRAVENOUS | Status: AC
  Filled 2023-04-25: qty 20

## 2023-04-25 MED ORDER — EZETIMIBE 10 MG PO TABS
10.0000 mg | ORAL_TABLET | Freq: Every day | ORAL | Status: DC
  Administered 2023-04-26: 10 mg via ORAL
  Filled 2023-04-25: qty 1

## 2023-04-25 MED ORDER — OXYMETAZOLINE HCL 0.05 % NA SOLN: 1.0000 | Freq: Two times a day (BID) | NASAL | Status: DC | PRN

## 2023-04-25 MED ORDER — HYDROCHLOROTHIAZIDE 25 MG PO TABS
25.0000 mg | ORAL_TABLET | Freq: Every day | ORAL | Status: DC
  Administered 2023-04-26: 25 mg via ORAL
  Filled 2023-04-25: qty 1

## 2023-04-25 MED ORDER — HEPARIN SODIUM (PORCINE) 5000 UNIT/ML IJ SOLN
5000.0000 [IU] | Freq: Three times a day (TID) | INTRAMUSCULAR | Status: DC
  Administered 2023-04-25 – 2023-04-26 (×3): 5000 [IU] via SUBCUTANEOUS
  Filled 2023-04-25 (×3): qty 1

## 2023-04-25 MED ORDER — PROPOFOL 500 MG/50ML IV EMUL
INTRAVENOUS | Status: DC | PRN
  Administered 2023-04-25: 150 ug/kg/min via INTRAVENOUS

## 2023-04-25 MED ORDER — PHENOL 1.4 % MT LIQD: 1.0000 | OROMUCOSAL | Status: DC | PRN

## 2023-04-25 MED ORDER — SENNOSIDES-DOCUSATE SODIUM 8.6-50 MG PO TABS: 1.0000 | ORAL_TABLET | Freq: Every evening | ORAL | Status: DC | PRN

## 2023-04-25 MED ORDER — HEPARIN 6000 UNIT IRRIGATION SOLUTION
Status: DC | PRN
  Administered 2023-04-25 (×2): 1

## 2023-04-25 MED ORDER — NITROPRUSSIDE SODIUM-NACL 20-0.9 MG/100ML-% IV SOLN: INTRAVENOUS | Status: DC | PRN

## 2023-04-25 MED ORDER — CHLORHEXIDINE GLUCONATE 0.12 % MT SOLN
OROMUCOSAL | Status: AC
  Filled 2023-04-25: qty 15

## 2023-04-25 MED ORDER — AMISULPRIDE (ANTIEMETIC) 5 MG/2ML IV SOLN: 10.0000 mg | Freq: Once | INTRAVENOUS | Status: DC | PRN

## 2023-04-25 MED ORDER — PANTOPRAZOLE SODIUM 40 MG PO TBEC
40.0000 mg | DELAYED_RELEASE_TABLET | Freq: Every day | ORAL | Status: DC
  Administered 2023-04-25 – 2023-04-26 (×2): 40 mg via ORAL
  Filled 2023-04-25 (×2): qty 1

## 2023-04-25 MED ORDER — ORAL CARE MOUTH RINSE: 15.0000 mL | Freq: Once | OROMUCOSAL | Status: AC

## 2023-04-25 MED ORDER — HYDROMORPHONE HCL 1 MG/ML IJ SOLN: 0.5000 mg | INTRAMUSCULAR | Status: DC | PRN

## 2023-04-25 MED ORDER — LATANOPROST 0.005 % OP SOLN
1.0000 [drp] | Freq: Every day | OPHTHALMIC | Status: DC
  Administered 2023-04-25: 1 [drp] via OPHTHALMIC
  Filled 2023-04-25: qty 2.5

## 2023-04-25 MED ORDER — MAGNESIUM SULFATE 2 GM/50ML IV SOLN: 2.0000 g | Freq: Every day | INTRAVENOUS | Status: DC | PRN

## 2023-04-25 MED ORDER — NITROGLYCERIN 0.4 MG SL SUBL: 0.4000 mg | SUBLINGUAL_TABLET | SUBLINGUAL | Status: DC | PRN

## 2023-04-25 MED ORDER — LABETALOL HCL 5 MG/ML IV SOLN
INTRAVENOUS | Status: DC | PRN
  Administered 2023-04-25 (×2): 5 mg via INTRAVENOUS

## 2023-04-25 MED ORDER — TIMOLOL MALEATE 0.5 % OP SOLN
1.0000 [drp] | Freq: Every day | OPHTHALMIC | Status: DC
  Administered 2023-04-25 – 2023-04-26 (×2): 1 [drp] via OPHTHALMIC
  Filled 2023-04-25: qty 5

## 2023-04-25 MED ORDER — MELOXICAM 7.5 MG PO TABS
7.5000 mg | ORAL_TABLET | Freq: Every day | ORAL | Status: DC
  Administered 2023-04-25 – 2023-04-26 (×2): 7.5 mg via ORAL
  Filled 2023-04-25 (×2): qty 1

## 2023-04-25 MED ORDER — CLOBETASOL PROPIONATE 0.05 % EX OINT: 1.0000 | TOPICAL_OINTMENT | Freq: Two times a day (BID) | CUTANEOUS | Status: DC | PRN

## 2023-04-25 MED ORDER — DEXAMETHASONE SODIUM PHOSPHATE 10 MG/ML IJ SOLN
INTRAMUSCULAR | Status: DC | PRN
  Administered 2023-04-25 (×2): 5 mg via INTRAVENOUS

## 2023-04-25 MED ORDER — PANTOPRAZOLE SODIUM 40 MG PO TBEC: 40.0000 mg | DELAYED_RELEASE_TABLET | Freq: Every day | ORAL | Status: DC

## 2023-04-25 MED ORDER — ESMOLOL HCL 100 MG/10ML IV SOLN
INTRAVENOUS | Status: DC | PRN
  Administered 2023-04-25 (×2): 20 mg via INTRAVENOUS
  Administered 2023-04-25: 30 mg via INTRAVENOUS

## 2023-04-25 MED ORDER — ROCURONIUM BROMIDE 10 MG/ML (PF) SYRINGE
PREFILLED_SYRINGE | INTRAVENOUS | Status: DC | PRN
  Administered 2023-04-25: 50 mg via INTRAVENOUS
  Administered 2023-04-25: 10 mg via INTRAVENOUS
  Administered 2023-04-25 (×2): 20 mg via INTRAVENOUS

## 2023-04-25 MED ORDER — PROPOFOL 10 MG/ML IV BOLUS
INTRAVENOUS | Status: DC | PRN
  Administered 2023-04-25: 200 mg via INTRAVENOUS

## 2023-04-25 MED ORDER — ONDANSETRON HCL 4 MG/2ML IJ SOLN: 4.0000 mg | Freq: Four times a day (QID) | INTRAMUSCULAR | Status: DC | PRN

## 2023-04-25 MED ORDER — DOXAZOSIN MESYLATE 2 MG PO TABS
2.0000 mg | ORAL_TABLET | Freq: Every day | ORAL | Status: DC
  Administered 2023-04-26: 2 mg via ORAL
  Filled 2023-04-25: qty 1

## 2023-04-25 MED ORDER — IODIXANOL 320 MG/ML IV SOLN
INTRAVENOUS | Status: DC | PRN
  Administered 2023-04-25: 143 mL via INTRA_ARTERIAL

## 2023-04-25 MED ORDER — POTASSIUM CHLORIDE CRYS ER 20 MEQ PO TBCR
20.0000 meq | EXTENDED_RELEASE_TABLET | Freq: Every day | ORAL | Status: AC | PRN
  Administered 2023-04-26: 40 meq via ORAL
  Filled 2023-04-25: qty 2

## 2023-04-25 MED ORDER — BISACODYL 5 MG PO TBEC: 5.0000 mg | DELAYED_RELEASE_TABLET | Freq: Every day | ORAL | Status: DC | PRN

## 2023-04-25 MED ORDER — HYDRALAZINE HCL 20 MG/ML IJ SOLN: 5.0000 mg | INTRAMUSCULAR | Status: DC | PRN

## 2023-04-25 MED ORDER — SODIUM CHLORIDE 0.9 % IV SOLN: INTRAVENOUS | Status: DC

## 2023-04-25 MED ORDER — ALBUMIN HUMAN 5 % IV SOLN: INTRAVENOUS | Status: DC | PRN

## 2023-04-25 MED ORDER — CHLORHEXIDINE GLUCONATE 0.12 % MT SOLN
15.0000 mL | Freq: Once | OROMUCOSAL | Status: AC
  Administered 2023-04-25: 15 mL via OROMUCOSAL

## 2023-04-25 MED ORDER — FENTANYL CITRATE (PF) 250 MCG/5ML IJ SOLN
INTRAMUSCULAR | Status: DC | PRN
  Administered 2023-04-25: 50 ug via INTRAVENOUS
  Administered 2023-04-25: 100 ug via INTRAVENOUS
  Administered 2023-04-25 (×3): 50 ug via INTRAVENOUS

## 2023-04-25 MED ORDER — ONDANSETRON HCL 4 MG/2ML IJ SOLN
INTRAMUSCULAR | Status: DC | PRN
  Administered 2023-04-25: 4 mg via INTRAVENOUS

## 2023-04-25 MED ORDER — ACETAMINOPHEN 160 MG/5ML PO SOLN: 325.0000 mg | Freq: Once | ORAL | Status: DC | PRN

## 2023-04-25 MED ORDER — PROMETHAZINE HCL 25 MG/ML IJ SOLN: 6.2500 mg | INTRAMUSCULAR | Status: DC | PRN

## 2023-04-25 MED ORDER — ROSUVASTATIN CALCIUM 20 MG PO TABS
40.0000 mg | ORAL_TABLET | Freq: Every day | ORAL | Status: DC
  Administered 2023-04-26: 40 mg via ORAL
  Filled 2023-04-25: qty 2

## 2023-04-25 MED ORDER — NITROGLYCERIN 0.2 MG/ML ON CALL CATH LAB
INTRAVENOUS | Status: DC | PRN
  Administered 2023-04-25 (×2): 40 ug via INTRAVENOUS

## 2023-04-25 MED ORDER — LABETALOL HCL 5 MG/ML IV SOLN: 10.0000 mg | INTRAVENOUS | Status: DC | PRN

## 2023-04-25 MED ORDER — ALBUTEROL SULFATE (2.5 MG/3ML) 0.083% IN NEBU: 2.5000 mg | INHALATION_SOLUTION | Freq: Four times a day (QID) | RESPIRATORY_TRACT | Status: DC | PRN

## 2023-04-25 MED ORDER — GUAIFENESIN-DM 100-10 MG/5ML PO SYRP: 15.0000 mL | ORAL_SOLUTION | ORAL | Status: DC | PRN

## 2023-04-25 MED ORDER — CEFAZOLIN SODIUM-DEXTROSE 2-4 GM/100ML-% IV SOLN
2.0000 g | INTRAVENOUS | Status: AC
  Administered 2023-04-25: 2 g via INTRAVENOUS

## 2023-04-25 MED ORDER — ACETAMINOPHEN 325 MG PO TABS: 325.0000 mg | ORAL_TABLET | ORAL | Status: DC | PRN

## 2023-04-25 SURGICAL SUPPLY — 58 items
ADH SKN CLS APL DERMABOND .7 (GAUZE/BANDAGES/DRESSINGS) ×2
BAG COUNTER SPONGE SURGICOUNT (BAG) ×2 IMPLANT
BAG SPNG CNTER NS LX DISP (BAG) ×1
CANISTER SUCT 3000ML PPV (MISCELLANEOUS) ×2 IMPLANT
CATH BALLOON XXL 14X20X120 (BALLOONS) IMPLANT
CATH BEACON 5 .035 65 KMP TIP (CATHETERS) IMPLANT
CATH BEACON 5.038 65CM KMP-01 (CATHETERS) ×2 IMPLANT
CATH OMNI FLUSH .035X70CM (CATHETERS) ×2 IMPLANT
CLOSURE PERCLOSE PROSTYLE (VASCULAR PRODUCTS) IMPLANT
DERMABOND ADVANCED .7 DNX12 (GAUZE/BANDAGES/DRESSINGS) ×2 IMPLANT
DEVICE CLOSURE PERCLS PRGLD 6F (VASCULAR PRODUCTS) ×8 IMPLANT
DEVICE ENSNARE 12MMX20MM (VASCULAR PRODUCTS) IMPLANT
DEVICE TORQUE KENDALL .025-038 (MISCELLANEOUS) IMPLANT
DRSG TEGADERM 2-3/8X2-3/4 SM (GAUZE/BANDAGES/DRESSINGS) ×4 IMPLANT
ELECT REM PT RETURN 9FT ADLT (ELECTROSURGICAL) ×2
ELECTRODE REM PT RTRN 9FT ADLT (ELECTROSURGICAL) ×4 IMPLANT
ENDOPRO ILIAC 23X12X10 FA (Endovascular Graft) ×1 IMPLANT
ENDOPROSTHESIS ILI 23X12X10 FA (Endovascular Graft) IMPLANT
EXCLDR TRNK ENDO 23X14.5X12 15 (Endovascular Graft) ×1 IMPLANT
EXCLUDER TNK END 23X14.5X12 15 (Endovascular Graft) IMPLANT
GAUZE SPONGE 2X2 8PLY STRL LF (GAUZE/BANDAGES/DRESSINGS) ×2 IMPLANT
GLIDEWIRE ADV .035X260CM (WIRE) IMPLANT
GLOVE BIO SURGEON STRL SZ7.5 (GLOVE) ×2 IMPLANT
GLOVE BIOGEL PI IND STRL 8 (GLOVE) ×2 IMPLANT
GOWN STRL REUS W/ TWL LRG LVL3 (GOWN DISPOSABLE) ×6 IMPLANT
GOWN STRL REUS W/ TWL XL LVL3 (GOWN DISPOSABLE) ×2 IMPLANT
GOWN STRL REUS W/TWL LRG LVL3 (GOWN DISPOSABLE) ×3
GOWN STRL REUS W/TWL XL LVL3 (GOWN DISPOSABLE) ×1
GRAFT BALLN CATH 65CM (BALLOONS) ×2 IMPLANT
GUIDEWIRE ANGLED .035X150CM (WIRE) IMPLANT
KIT BASIN OR (CUSTOM PROCEDURE TRAY) ×2 IMPLANT
KIT ENCORE 26 ADVANTAGE (KITS) IMPLANT
KIT TURNOVER KIT B (KITS) ×2 IMPLANT
LEG CONTRALATERAL 16X20X9.5 (Endovascular Graft) ×1 IMPLANT
LEG CONTRALATERAL 27X10 (Vascular Products) IMPLANT
NS IRRIG 1000ML POUR BTL (IV SOLUTION) ×2 IMPLANT
PACK ENDOVASCULAR (PACKS) ×2 IMPLANT
PAD ARMBOARD 7.5X6 YLW CONV (MISCELLANEOUS) ×4 IMPLANT
PERCLOSE PROGLIDE 6F (VASCULAR PRODUCTS) ×4
SET MICROPUNCTURE 5F STIFF (MISCELLANEOUS) ×2 IMPLANT
SHEATH BRITE TIP 8FR 23CM (SHEATH) ×2 IMPLANT
SHEATH DRYSEAL FLEX 12FR 45CM (SHEATH) IMPLANT
SHEATH DRYSEAL FLEX 16FR 33CM (SHEATH) IMPLANT
SHEATH PINNACLE 8F 10CM (SHEATH) ×2 IMPLANT
SHEATH PINNACLE ST 7F 65CM (SHEATH) IMPLANT
STENT GRAFT CONTRALAT 16X20X9. (Endovascular Graft) IMPLANT
STENT VIABAHN 8X59 8FR 135 (Permanent Stent) IMPLANT
STENT VIABAHN 8X59X80 LG (Endovascular Graft) IMPLANT
STOPCOCK MORSE 400PSI 3WAY (MISCELLANEOUS) ×2 IMPLANT
SUT MNCRL AB 4-0 PS2 18 (SUTURE) ×2 IMPLANT
SYR 20ML LL LF (SYRINGE) ×2 IMPLANT
TOWEL GREEN STERILE (TOWEL DISPOSABLE) ×2 IMPLANT
TRAY FOLEY MTR SLVR 16FR STAT (SET/KITS/TRAYS/PACK) ×2 IMPLANT
TUBING HIGH PRESSURE 120CM (CONNECTOR) ×2 IMPLANT
TUBING INJECTOR 48 (MISCELLANEOUS) IMPLANT
WIRE AMPLATZ SS-J .035X180CM (WIRE) ×4 IMPLANT
WIRE BENTSON .035X145CM (WIRE) ×4 IMPLANT
WIRE ROSEN-J .035X260CM (WIRE) IMPLANT

## 2023-04-25 NOTE — Anesthesia Procedure Notes (Signed)
Arterial Line Insertion Start/End7/05/2023 10:15 AM, 04/25/2023 10:30 AM Performed by: Shelton Silvas, MD, CRNA  Preanesthetic checklist: patient identified, IV checked, site marked, risks and benefits discussed, surgical consent, monitors and equipment checked, pre-op evaluation, timeout performed and anesthesia consent Lidocaine 1% used for infiltration Left, radial was placed Catheter size: 20 G Hand hygiene performed  and maximum sterile barriers used   Attempts: 3 Procedure performed without using ultrasound guided technique. Following insertion, dressing applied and Biopatch. Post procedure assessment: normal  Patient tolerated the procedure well with no immediate complications.

## 2023-04-25 NOTE — H&P (Signed)
History and Physical Interval Note:  04/25/2023 9:58 AM  Jeffrey Hudson  has presented today for surgery, with the diagnosis of Iliac artery aneurysm, Infrarenal abdominal aortic aneurysm without rupture.  The various methods of treatment have been discussed with the patient and family. After consideration of risks, benefits and other options for treatment, the patient has consented to  Procedure(s): ABDOMINAL AORTIC ENDOVASCULAR STENT GRAFT WITH RIGHT ILIAC BRANCH ENDOPROSTHESIS (N/A) as a surgical intervention.  The patient's history has been reviewed, patient examined, no change in status, stable for surgery.  I have reviewed the patient's chart and labs.  Questions were answered to the patient's satisfaction.     Cephus Shelling     Patient name: Jeffrey Hudson        MRN: [id number]        DOB: [birth year]        Sex: male   REASON FOR CONSULT: F/U after CTA for 6.1 cm right common iliac aneurysm   HPI: Jeffrey Hudson is a 72 y.o. male, with hx COPD, CAD s/p STEMI, HTN, HLD, tobacco abuse that presents for follow-up after CTA for evaluation of a 6.1 cm right common iliac artery aneurysm.  Patient underwent ultrasound aorta Medicare screening on 04/03/2023.  There was evidence of a 4.8 cm abdominal aortic aneurysm with a 6.1 cm right common iliac artery aneurysm.  He was not having any abdominal or back pain.  He was down to smoking about 2 cigarettes a day but previously was smoking a pack a day for about 40 years.  No previous abdominal surgery.  Does have an umbilical hernia.   After initial evaluation last week I sent him for CTA and he presents for follow-up.         Past Medical History:  Diagnosis Date   AMI (acute myocardial infarction) (HCC)      Acute ST elevation   Anxiety     Arthritis      neck   Bipolar disorder (HCC)     COPD (chronic obstructive pulmonary disease) (HCC)     Coronary artery disease      a. s/p anterolateral STEMI with BMS placed in large  diagonal branch (2009).   Emphysema of lung (HCC)     GERD (gastroesophageal reflux disease)     Glaucoma     Hx of adenomatous polyp of colon 05/06/2021    diminutive - no f/u needed   Hyperlipidemia     Hypertension     Nodule of left lung      6-mm smooothly rounded nodule   Plantar fasciitis      left foot   Pre-diabetes     Syncope and collapse      in 2001 and 2004 with no clear cause   Tobacco abuse     Viral URI with cough 11/22/2016           Past Surgical History:  Procedure Laterality Date   COLONOSCOPY       PERCUTANEOUS CORONARY STENT INTERVENTION (PCI-S)       UPPER GASTROINTESTINAL ENDOSCOPY               Family History  Problem Relation Age of Onset   Dementia Mother 4        died   Alcohol abuse Father 34   Depression Brother     Healthy Son     Colon cancer Neg Hx     Esophageal cancer Neg Hx  Rectal cancer Neg Hx     Stomach cancer Neg Hx        SOCIAL HISTORY: Social History         Socioeconomic History   Marital status: Married      Spouse name: Not on file   Number of children: 1   Years of education: Not on file   Highest education level: Bachelor's degree (e.g., BA, AB, BS)  Occupational History   Occupation: Retired-Sales/broker  Tobacco Use   Smoking status: Every Day      Packs/day: 1.00      Years: 54.00      Additional pack years: 0.00      Total pack years: 54.00      Types: Cigarettes      Start date: 1970   Smokeless tobacco: Never   Tobacco comments:      Patient states he's down to smoking 3 cigarettes a day as of 01/05/23 ep  Vaping Use   Vaping Use: Never used  Substance and Sexual Activity   Alcohol use: Yes      Comment: 2 drinks daily   Drug use: No      Comment: marijuana quit 2010. He has hx of THC use.   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[employer]    He has been  married for 35 years     One child (son)          He likes to [employer] - plays once a week     2 glasses of wine a day max, still smoking 2 cups of coffee a day no drug use or other tobacco    Social Determinants of Health        Financial Resource Strain: Low Risk  (07/06/2022)    Overall Financial Resource Strain (CARDIA)     Difficulty of Paying Living Expenses: Not hard at all  Food Insecurity: No Food Insecurity (07/06/2022)    Hunger Vital Sign     Worried About Running Out of Food in the Last Year: Never true     Ran Out of Food in the Last Year: Never true  Transportation Needs: No Transportation Needs (07/06/2022)    PRAPARE - Therapist, art (Medical): No     Lack of Transportation (Non-Medical): No  Physical Activity: Inactive (07/06/2022)    Exercise Vital Sign     Days of Exercise per Week: 0 days     Minutes of Exercise per Session: 0 min  Stress: No Stress Concern Present (07/06/2022)    [employer] of Occupational Health - Occupational Stress Questionnaire     Feeling of Stress : Not at all  Social Connections: Moderately Isolated (12/01/2021)    Social Connection and Isolation Panel [NHANES]     Frequency of Communication with Friends and Family: Once a week     Frequency of Social Gatherings with Friends and Family: Once a week     Attends Religious Services: More than 4 times per year     Active Member of [employer] or Organizations: No     Attends Banker Meetings: Not on file     Marital Status: Married  Intimate Partner Violence: Not At Risk (06/02/2021)    Humiliation, Afraid, Rape, and Kick questionnaire     Fear of Current or Ex-Partner:  No     Emotionally Abused: No     Physically Abused: No     Sexually Abused: No           Allergies  Allergen Reactions   Lisinopril Swelling      Angio edema    Metformin And Related Diarrhea   Norvasc [Amlodipine] Swelling      Lower extremity             Current Outpatient  Medications  Medication Sig Dispense Refill   albuterol (PROAIR HFA) 108 (90 Base) MCG/ACT inhaler Inhale 2 puffs into the lungs every 6 (six) hours as needed. 1 Inhaler 3   aspirin 81 MG tablet Take 81 mg by mouth daily.       betamethasone dipropionate 0.05 % cream Apply 1 application  topically as needed (for eczema).       clobetasol ointment (TEMOVATE) 0.05 % Apply 1 application. topically 2 (two) times daily.       cyclobenzaprine (FLEXERIL) 10 MG tablet Take 10 mg by mouth daily.       doxazosin (CARDURA) 2 MG tablet Take 1 tablet (2 mg total) by mouth daily. 90 tablet 3   ezetimibe (ZETIA) 10 MG tablet Take 1 tablet (10 mg total) by mouth daily. 90 tablet 0   fluocinonide (LIDEX) 0.05 % external solution Apply 1 Application topically 2 (two) times daily.       FLUoxetine (PROZAC) 20 MG capsule Take 1 capsule (20 mg total) by mouth daily. 90 capsule 2   hydrochlorothiazide (HYDRODIURIL) 25 MG tablet Take 1 tablet (25 mg total) by mouth daily. 90 tablet 3   latanoprost (XALATAN) 0.005 % ophthalmic solution SMARTSIG:1 Drop(s) In Eye(s) Every Evening       meloxicam (MOBIC) 7.5 MG tablet TAKE 1 TABLET BY MOUTH  DAILY 90 tablet 3   metoprolol succinate (TOPROL-XL) 25 MG 24 hr tablet Take 1 tablet (25 mg total) by mouth daily. 90 tablet 3   Multiple Vitamin (MULTIVITAMIN) tablet Take 1 tablet by mouth daily.       nitroGLYCERIN (NITROSTAT) 0.4 MG SL tablet Place 1 tablet (0.4 mg total) under the tongue every 5 (five) minutes as needed for chest pain. 25 tablet 11   pantoprazole (PROTONIX) 40 MG tablet TAKE 1 TABLET BY MOUTH DAILY  BEFORE BREAKFAST 90 tablet 3   rosuvastatin (CRESTOR) 40 MG tablet TAKE 1 TABLET BY MOUTH DAILY 90 tablet 3   timolol (BETIMOL) 0.25 % ophthalmic solution Place 1-2 drops into both eyes daily.        No current facility-administered medications for this visit.      REVIEW OF SYSTEMS:  [X]  denotes positive finding, [ ]  denotes negative finding Cardiac   Comments:   Chest pain or chest pressure:      Shortness of breath upon exertion:      Short of breath when lying flat:      Irregular heart rhythm:             Vascular      Pain in calf, thigh, or hip brought on by ambulation:      Pain in feet at night that wakes you up from your sleep:       Blood clot in your veins:      Leg swelling:              Pulmonary      Oxygen at home:      Productive cough:  Wheezing:              Neurologic      Sudden weakness in arms or legs:       Sudden numbness in arms or legs:       Sudden onset of difficulty speaking or slurred speech:      Temporary loss of vision in one eye:       Problems with dizziness:              Gastrointestinal      Blood in stool:       Vomited blood:              Genitourinary      Burning when urinating:       Blood in urine:             Psychiatric      Major depression:              Hematologic      Bleeding problems:      Problems with blood clotting too easily:             Skin      Rashes or ulcers:             Constitutional      Fever or chills:          PHYSICAL EXAM:    Vitals:    04/12/23 1507  BP: 125/72  Pulse: 62  Temp: 97.9 F (36.6 C)  TempSrc: Temporal  SpO2: 95%  Weight: 146 lb (66.2 kg)      GENERAL: The patient is a well-nourished male, in no acute distress. The vital signs are documented above. CARDIAC: There is a regular rate and rhythm.  VASCULAR:  Bilateral femoral pulses palpable Bilateral DP/PT pulses palpable PULMONARY: No respiratory distress. ABDOMEN: Soft and non-tender.  No pain with deep palpation of aneurysm. MUSCULOSKELETAL: There are no major deformities or cyanosis. NEUROLOGIC: No focal weakness or paresthesias are detected. SKIN: There are no ulcers or rashes noted. PSYCHIATRIC: The patient has a normal affect.   DATA:    CTA reviewed with evidence of a 3.8 cm abdominal aortic aneurysm and a 5.4 cm right common iliac artery aneurysm    Assessment/Plan:   72 y.o. male, with hx COPD, CAD s/p STEMI in 2009, HTN, HLD, tobacco abuse that presents for follow-up after CTA for further evaluation of a 6.1 cm right common iliac artery aneurysm identified on [place].  I sent him for CTA to further evaluate.  I reviewed the CT scan with the patient and his wife and discussed that I measure his right common iliac aneurysm at 5.4 cm.  Discussed that we repair iliac artery aneurysms at greater than 3.5 cm given risk of rupture.  He has associated abdominal aortic aneurysm as well that measures 3.8 cm.  I think he would be a candidate for stent graft repair as discussed today.  I have recommended an iliac branch endoprosthesis on the right for his iliac aneurysm bridged with abdominal aortic stent graft in the infrarenal aorta.  I offered to get my schedule rearranged to do this tomorrow but he states he needs some days to get his affairs in order.  I discussed I can have availability for 04/25/23 and will add to the schedule.  I discussed risk and benefits including risk of anesthesia, MI, stroke, bleeding, vessel injury, conversion open etc. All questions answered.     Cephus Shelling,  MD [facility] Office: [phone]

## 2023-04-25 NOTE — Transfer of Care (Signed)
Immediate Anesthesia Transfer of Care Note  Patient: IMAAD PASSINO  Procedure(s) Performed: ABDOMINAL AORTIC ENDOVASCULAR STENT GRAFT WITH RIGHT ILIAC BRANCH ENDOPROSTHESIS (Bilateral: Groin) ULTRASOUND GUIDANCE FOR VASCULAR ACCESS (Bilateral: Groin)  Patient Location: PACU  Anesthesia Type:General  Level of Consciousness: awake, alert , oriented, and patient cooperative  Airway & Oxygen Therapy: Patient Spontanous Breathing and Patient connected to face mask oxygen  Post-op Assessment: Report given to RN and Post -op Vital signs reviewed and stable  Post vital signs: Reviewed and stable  Last Vitals:  Vitals Value Taken Time  BP 120/66 04/25/23 1325  Temp 36.6 C 04/25/23 1325  Pulse 59 04/25/23 1329  Resp 10 04/25/23 1329  SpO2 90 % 04/25/23 1329  Vitals shown include unvalidated device data.  Last Pain:  Vitals:   04/25/23 1325  TempSrc:   PainSc: 0-No pain         Complications: No notable events documented.

## 2023-04-25 NOTE — Anesthesia Postprocedure Evaluation (Signed)
Anesthesia Post Note  Patient: Jeffrey Hudson  Procedure(s) Performed: ABDOMINAL AORTIC ENDOVASCULAR STENT GRAFT WITH RIGHT ILIAC BRANCH ENDOPROSTHESIS (Bilateral: Groin) ULTRASOUND GUIDANCE FOR VASCULAR ACCESS (Bilateral: Groin)     Patient location during evaluation: PACU Anesthesia Type: General Level of consciousness: awake and alert Pain management: pain level controlled Vital Signs Assessment: post-procedure vital signs reviewed and stable Respiratory status: spontaneous breathing, nonlabored ventilation, respiratory function stable and patient connected to nasal cannula oxygen Cardiovascular status: blood pressure returned to baseline and stable Postop Assessment: no apparent nausea or vomiting Anesthetic complications: no  There were no known notable events for this encounter.  Last Vitals:  Vitals:   04/25/23 1504 04/25/23 1515  BP: (!) 117/58 121/63  Pulse: (!) 50 (!) 54  Resp: 16 14  Temp:    SpO2: 95% 96%    Last Pain:  Vitals:   04/25/23 1445  TempSrc:   PainSc: 0-No pain                 Shelton Silvas

## 2023-04-25 NOTE — Progress Notes (Signed)
  Progress Note    04/25/2023 3:43 PM Day of Surgery  Subjective:  some back discomfort from laying flat and groins a little sore. No other complaints   Vitals:   04/25/23 1504 04/25/23 1515  BP: (!) 117/58 121/63  Pulse: (!) 50 (!) 54  Resp: 16 14  Temp:    SpO2: 95% 96%   Physical Exam: Cardiac:  regular Lungs:  non labored Incisions:  B groin incisions intact, soft without swelling or hematoma Extremities:  BLE well perfused and warm with palpable DP pulses bilaterally Abdomen:  distended but soft, non tender Neurologic: alert and oriented  CBC    Component Value Date/Time   WBC 6.5 04/25/2023 1342   RBC 3.07 (L) 04/25/2023 1342   HGB 10.3 (L) 04/25/2023 1342   HGB 14.6 08/05/2020 0842   HCT 30.8 (L) 04/25/2023 1342   HCT 41.9 08/05/2020 0842   PLT 144 (L) 04/25/2023 1342   PLT 133 (L) 08/05/2020 0842   MCV 100.3 (H) 04/25/2023 1342   MCV 95 08/05/2020 0842   MCH 33.6 04/25/2023 1342   MCHC 33.4 04/25/2023 1342   RDW 12.5 04/25/2023 1342   RDW 12.6 08/05/2020 0842   LYMPHSABS 1.7 01/15/2021 0926   MONOABS 1.2 (H) 01/15/2021 0926   EOSABS 0.4 01/15/2021 0926   BASOSABS 0.1 01/15/2021 0926    BMET    Component Value Date/Time   NA 137 04/25/2023 1342   NA 138 03/15/2023 0948   K 3.7 04/25/2023 1342   CL 106 04/25/2023 1342   CO2 23 04/25/2023 1342   GLUCOSE 101 (H) 04/25/2023 1342   BUN 15 04/25/2023 1342   BUN 17 03/15/2023 0948   CREATININE 0.95 04/25/2023 1342   CREATININE 0.86 05/01/2020 1004   CALCIUM 8.1 (L) 04/25/2023 1342   GFRNONAA >60 04/25/2023 1342   GFRAA 96 08/05/2020 0842    INR    Component Value Date/Time   INR 1.1 04/25/2023 1342     Intake/Output Summary (Last 24 hours) at 04/25/2023 1543 Last data filed at 04/25/2023 1449 Gross per 24 hour  Intake 2200 ml  Output 725 ml  Net 1475 ml     Assessment/Plan:  72 y.o. male is s/p 1.  Ultrasound-guided access bilateral common femoral arteries with Perclose closure device 2.   Aortogram with iliac artery arteriogram with catheter selection of abdominal aorta 3.  Right iliac branch endoprosthesis (IBE) for treatment of right common iliac artery aneurysm (CPT 34717) 4.  Placement of aortobiiliac endograft for treatment of abdominal aortic aneurysm (CPT 34705) 5.  Placement of extension stent graft for endovascular repair of abdominal aortic aneurysm and right common iliac artery aneurysm (CPT [phone]) Day of Surgery   Still within 4 hour window to lay flat but doing well Hemodynamically stable B groins intact and well appearing without swelling or hematoma BLE well perfused and warm with palpable DP pulses Anticipate discharge tomorrow as long as he continues to progress well    Graceann Congress, PA-C [facility] [phone] 04/25/2023 3:43 PM

## 2023-04-25 NOTE — Op Note (Signed)
Date: April 25, 2023  Preoperative diagnosis:  3.8 cm abdominal aortic aneurysm 5.4 cm right common iliac artery aneurysm  Postoperative diagnosis: Same  Procedure: 1.  Ultrasound-guided access bilateral common femoral arteries with Perclose closure device 2.  Aortogram with iliac artery arteriogram with catheter selection of abdominal aorta 3.  Right iliac branch endoprosthesis (IBE) for treatment of right common iliac artery aneurysm (CPT 34717) 4.  Placement of aortobiiliac endograft for treatment of abdominal aortic aneurysm (CPT 34705) 5.  Placement of extension stent graft for endovascular repair of abdominal aortic aneurysm and right common iliac artery aneurysm (CPT [phone])  Surgeon: Dr. Cephus Shelling, MD  Assistant: Mosetta Pigeon, PA  Indications: Patient is a 72 year old male seen with evidence of a large right common iliac aneurysm that measures 5.4 cm by CT as well as a small 3.8 cm abdominal aortic aneurysm.  He presents today for endovascular stent graft repair of both aneurysms and we discussed a iliac branch device for the right common iliac aneurysm with a aortobiiliac stent graft for treatment of his abdominal aortic aneurysm.  Risk benefits discussed.  An assistant was needed for wire and catheter exchanges and for stent graft appointment.  Findings: Ultrasound-guided access bilateral common femoral arteries with Perclose closure device.  Initially placed a 16 French Gore dry seal in the right common femoral artery with a 12 French Gore dry seal in the left common femoral artery.  The iliac branch device was placed using a 23 mm x 12 mm x 10 cm in the right common iliac aneurysm and then we used two 8 L x 59 mm VBX into the hypogastric on the right with preservation.  We then upsized to 16 French sheath in the left groin.  We then placed the main body endograft in the 16 French sheath in the left groin.  We then placed the main body endograft using a 23 mm x 14 mm x  12 cm on the left preserving the renals.  We then used a 20 mm x 9.5 cm bellbottom piece on the left common iliac artery with preservation of the hypogastric.  On the right we used a 27 mm x 10 cm bridge from the EVAR to the IBE.  Anesthesia: General  Details: Patient was taken to the operating room after informed consent was obtained.  Placed on operative table in supine position.  General endotracheal anesthesia was induced.  Bilateral groins and abdomen were then prepped and draped in standard sterile fashion.  Antibiotics were given.  Timeout performed.  Initially used ultrasound to evaluate the right common femoral artery, it was patent, an image was saved.  This was accessed with micro access needle, placed a microwire, and then a microsheath and then used a Bentson wire and then an 8 [place] dilator and then used Perclose devices at 10:00 and 2:00 in the right common femoral artery and then placed an 8 [place] sheath.  We then did the same steps in the left groin again using ultrasound to access the left common femoral artery and then a micro access needle and placed a microwire and then a microsheath and then Bentson wire 8 French dilator and Perclose devices at 10:00 and 2:00.  We then exchanged for stiff Amplatz wires over KMP catheters.  16 French Gore dry seal was placed in the right common femoral artery with 12 French Gore dry seal in the left common femoral artery.  Patient was given 100 units/kg IV heparin.  ACT was  checked to maintain greater than 250. I then snared a Glidewire advantage for through and through wire access over the aortic bifurcation coming from the left groin with a snare.  We then used this to load the iliac branch endoprosthesis in the right groin sheath.  We placed the iliac branch endoprosthesis up the right sheath into the right common iliac aneurysm.  We then got dedicated aortogram to evaluate the takeoff of the right hypogastric from the aneurysm.  I then deployed the  main body of the iliac branch down to the gate.  I then was able to drive a 12 French sheath into the gate of the iliac branch endoprosthesis coming from the left groin.  I then used a KMP catheter with a Glidewire to then cannulate the right hypogastric artery off the aneurysm.  This was very difficult given the size of the aneurysm and angulation.  Finally I was able to cannulate this and drive my KMP catheter into the hypogastric and exchanged for a Rosen wire.  We then extended with an 8 mm L x 59 mm VBX from the iliac branch endoprosthesis into the right hypogastric.  This was then treated with a 14 mm angioplasty balloon to get seal from the bridge placed into the hypogastric stent.  Ultimately the stent was short and had to extend with a second 8 mm L x 59 mm VBX.  We then had excellent seal with preservation of the hypogastric.  I finished deploying the right external iliac branch of the iliac branch endoprosthesis.  I then upsized to a 16 French sheath in the left groin.  We then placed the main body device up the left groin into the abdominal aorta using a 23 mm x 14 mm x 12 cm.  We got an abdominal aortogram to identify the lowest renal and the main body device was deployed just below the right renal all the way down to the gate.  I then came from the right groin after we put our sheath through the iliac branch endoprosthesis that had been deployed and I cannulated the gate with a KMP and a Bentson wire and we spun the catheter in the main body of the endograft to confirm we were in the true lumen.  I then put a stiff wire back up and we selected a 27 mm x 10 cm bridge pieces from the main body endograft in the abdominal aorta into the right iliac branch endoprosthesis.  On the left we finished deploying the main body of the graft and then extended with a 20 mm x 9.5 cm bell bottom into the left common iliac artery with preservation of the hypogastric.  I then used a MOB balloon and all proximal and  distal landing zones were treated with the help of my assistant.  Final aortogram showed widely patent stent graft in the abdominal aorta as well as the iliac branch endoprosthesis.  There was appropriate exclusion of the aneurysm with no endoleak.  Wires and catheters were exchanged for a Bentson wires in both groins.  I then removed first the right groin sheath with manual pressure and tied down the Perclose's at 10:00 and 2:00 with good hemostasis and then did the same in the left groin removing the 16 French sheath and tying down the Perclose at 10:00 and 2:00.  Good femoral pulses.  Palpable pedal pulses.  Protamine was given.  We then closed both incisions with 4-0 Monocryl Dermabond.  Taken to recovery in stable condition.  Complication: None  Condition: Stable  Cephus Shelling, MD [facility] [place] Office: [phone]   Cephus Shelling

## 2023-04-25 NOTE — Plan of Care (Signed)
  Problem: Education: Goal: Knowledge of discharge needs will improve Outcome: Progressing   Problem: Clinical Measurements: Goal: Postoperative complications will be avoided or minimized Outcome: Progressing   Problem: Respiratory: Goal: Ability to achieve and maintain a regular respiratory rate will improve Outcome: Progressing   Problem: Skin Integrity: Goal: Demonstration of wound healing without infection will improve Outcome: Progressing   

## 2023-04-25 NOTE — Anesthesia Procedure Notes (Signed)
Procedure Name: Intubation Date/Time: 04/25/2023 10:47 AM  Performed by: Kayleen Memos, CRNAPre-anesthesia Checklist: Patient identified, Emergency Drugs available, Suction available and Patient being monitored Patient Re-evaluated:Patient Re-evaluated prior to induction Oxygen Delivery Method: Circle System Utilized Preoxygenation: Pre-oxygenation with 100% oxygen Induction Type: IV induction Ventilation: Mask ventilation without difficulty and Oral airway inserted - appropriate to patient size Laryngoscope Size: Glidescope and 4 Grade View: Grade I Tube type: Oral Tube size: 7.5 mm Number of attempts: 1 Airway Equipment and Method: Stylet and Oral airway Placement Confirmation: ETT inserted through vocal cords under direct vision, positive ETCO2 and breath sounds checked- equal and bilateral Secured at: 23 cm Tube secured with: Tape Dental Injury: Teeth and Oropharynx as per pre-operative assessment

## 2023-04-26 ENCOUNTER — Ambulatory Visit: Payer: Medicare Other

## 2023-04-26 LAB — CBC
HCT: 27.1 % — ABNORMAL LOW (ref 39.0–52.0)
Hemoglobin: 9.3 g/dL — ABNORMAL LOW (ref 13.0–17.0)
MCH: 32.9 pg (ref 26.0–34.0)
MCHC: 34.3 g/dL (ref 30.0–36.0)
MCV: 95.8 fL (ref 80.0–100.0)
Platelets: 139 10*3/uL — ABNORMAL LOW (ref 150–400)
RBC: 2.83 MIL/uL — ABNORMAL LOW (ref 4.22–5.81)
RDW: 12.2 % (ref 11.5–15.5)
WBC: 8.9 10*3/uL (ref 4.0–10.5)
nRBC: 0 % (ref 0.0–0.2)

## 2023-04-26 LAB — BASIC METABOLIC PANEL
Anion gap: 8 (ref 5–15)
BUN: 18 mg/dL (ref 8–23)
CO2: 22 mmol/L (ref 22–32)
Calcium: 7.9 mg/dL — ABNORMAL LOW (ref 8.9–10.3)
Chloride: 102 mmol/L (ref 98–111)
Creatinine, Ser: 1.05 mg/dL (ref 0.61–1.24)
GFR, Estimated: >60 mL/min (ref >60)
Glucose, Bld: 249 mg/dL — ABNORMAL HIGH (ref 70–99)
Potassium: 3.4 mmol/L — ABNORMAL LOW (ref 3.5–5.1)
Sodium: 132 mmol/L — ABNORMAL LOW (ref 135–145)

## 2023-04-26 MED ORDER — OXYCODONE-ACETAMINOPHEN 5-325 MG PO TABS: 1.0000 | ORAL_TABLET | ORAL | 0 refills | Status: DC | PRN

## 2023-04-26 NOTE — Progress Notes (Addendum)
[facility] [place]  Subjective  - doing well no complaints   Objective 134/69 (!) 51 98 F (36.7 C) (Oral) 18 98%  Intake/Output Summary (Last 24 hours) at 04/26/2023 0720 Last data filed at 04/26/2023 0300 Gross per 24 hour  Intake 3320.06 ml  Output 725 ml  Net 2595.06 ml    Groins soft without hematoma Palpable pedal pulses Abdomin soft NTTP Lungs non labored breathing   Assessment/Planning: POD # 1   1.  Ultrasound-guided access bilateral common femoral arteries with Perclose closure device 2.  Aortogram with iliac artery arteriogram with catheter selection of abdominal aorta 3.  Right iliac branch endoprosthesis (IBE) for treatment of right common iliac artery aneurysm (CPT 34717) 4.  Placement of aortobiiliac endograft for treatment of abdominal aortic aneurysm (CPT 34705) 5.  Placement of extension stent graft for endovascular repair of abdominal aortic aneurysm and right common iliac artery aneurysm (CPT [phone]) Day of Surgery   Stable post op disposition Plan for discharge home today with 4 week f/u with CTA  Mosetta Pigeon 04/26/2023 7:20 AM --  Laboratory Lab Results: Recent Labs    04/25/23 1342 04/26/23 0330  WBC 6.5 8.9  HGB 10.3* 9.3*  HCT 30.8* 27.1*  PLT 144* 139*   BMET Recent Labs    04/25/23 1342 04/26/23 0330  NA 137 132*  K 3.7 3.4*  CL 106 102  CO2 23 22  GLUCOSE 101* 249*  BUN 15 18  CREATININE 0.95 1.05  CALCIUM 8.1* 7.9*    COAG Lab Results  Component Value Date   INR 1.1 04/25/2023   INR 0.9 04/20/2023   INR 0.87 02/10/2010   No results found for: "PTT"  I have seen and evaluated the patient. I agree with the PA note as documented above.  Postop day 1 status post right iliac branch device for large right common iliac aneurysm with aortobiiliac stent graft for abdominal aortic aneurysm.  Both of his groins look great with no hematoma.  He has palpable pedal pulses.  No abdominal pain.  Plan  discharge today with follow-up in 1 month with CTA for surveillance.  Discussed he maintain aspirin.  Discussed he call with questions or concerns.  K replaced.  Cephus Shelling, MD [facility] [place] Office: [phone]

## 2023-04-26 NOTE — Discharge Instructions (Signed)
   [facility] [place]   Discharge Instructions  Endovascular Aortic Aneurysm Repair  Please refer to the following instructions for your post-procedure care. Your surgeon or Physician Assistant will discuss any changes with you.  Activity  You are encouraged to walk as much as you can. You can slowly return to normal activities but must avoid strenuous activity and heavy lifting until your doctor tells you it's OK. Avoid activities such as vacuuming or swinging a gold club. It is normal to feel tired for several weeks after your surgery. Do not drive until your doctor gives the OK and you are no longer taking prescription pain medications. It is also normal to have difficulty with sleep habits, eating, and bowel movements after surgery. These will go away with time.  Bathing/Showering  You may shower after you go home. If you have an incision, do not soak in a bathtub, hot tub, or swim until the incision heals completely.  Incision Care  Shower every day. Clean your incision with mild soap and water. Pat the area dry with a clean towel. You do not need a bandage unless otherwise instructed. Do not apply any ointments or creams to your incision. If you clothing is irritating, you may cover your incision with a dry gauze pad.  Diet  Resume your normal diet. There are no special food restrictions following this procedure. A low fat/low cholesterol diet is recommended for all patients with vascular disease. In order to heal from your surgery, it is CRITICAL to get adequate nutrition. Your body requires vitamins, minerals, and protein. Vegetables are the best source of vitamins and minerals. Vegetables also provide the perfect balance of protein. Processed food has little nutritional value, so try to avoid this.  Medications  Resume taking all of your medications unless your doctor or nurse practitioner tells you not to. If your incision is causing pain, you may take  over-the-counter pain relievers such as acetaminophen (Tylenol). If you were prescribed a stronger pain medication, please be aware these medications can cause nausea and constipation. Prevent nausea by taking the medication with a snack or meal. Avoid constipation by drinking plenty of fluids and eating foods with a high amount of fiber, such as fruits, vegetables, and grains. Do not take Tylenol if you are taking prescription pain medications.   Follow up  Our office will schedule a follow-up appointment with a C.T. scan 3-4 weeks after your surgery.  Please call us immediately for any of the following conditions  Severe or worsening pain in your legs or feet or in your abdomen back or chest. Increased pain, redness, drainage (pus) from your incision sit. Increased abdominal pain, bloating, nausea, vomiting or persistent diarrhea. Fever of 101 degrees or higher. Swelling in your leg (s),  Reduce your risk of vascular disease  Stop smoking. If you would like help call [facility] at 1-800-QUIT-NOW (1-800-784-8669) or [facility] at 336-586-4000. Manage your cholesterol Maintain a desired weight Control your diabetes Keep your blood pressure down  If you have questions, please call the office at [phone].   

## 2023-04-26 NOTE — Progress Notes (Signed)
Jeffrey Hudson to be D/C'd Home per MD order.  Discussed with the patient and all questions fully answered.  VSS, Skin clean, dry and intact without evidence of skin break down, no evidence of skin tears noted. IV catheter discontinued intact. Site without signs and symptoms of complications. Dressing and pressure applied.  An After Visit Summary was printed and given to the patient. Patient received prescription.  D/c education completed with patient/family including follow up instructions, medication list, d/c activities limitations if indicated, with other d/c instructions as indicated by MD - patient able to verbalize understanding, all questions fully answered.   Patient instructed to return to ED, call 911, or call MD for any changes in condition.   Patient escorted via WC, and D/C home via private auto.  [name] 04/26/2023 11:15 AM

## 2023-04-27 ENCOUNTER — Encounter (HOSPITAL_COMMUNITY): Payer: Self-pay | Admitting: Vascular Surgery

## 2023-04-27 ENCOUNTER — Telehealth: Payer: Self-pay | Admitting: Physician Assistant

## 2023-04-27 ENCOUNTER — Telehealth: Payer: Self-pay

## 2023-04-27 ENCOUNTER — Other Ambulatory Visit: Payer: Medicare Other

## 2023-04-27 NOTE — Transitions of Care (Post Inpatient/ED Visit) (Signed)
   04/27/2023  Name: Jeffrey Hudson MRN: [id number] DOB: [birth year]  Today's TOC FU Call Status: Today's TOC FU Call Status:: Unsuccessul Call (1st Attempt) Unsuccessful Call (1st Attempt) Date: 04/27/23  Attempted to reach the patient regarding the most recent Inpatient/ED visit.  Follow Up Plan: Additional outreach attempts will be made to reach the patient to complete the Transitions of Care (Post Inpatient/ED visit) call.      Antionette Fairy, RN,BSN,CCM [facility] Care Management Care Management Community Coordinator Direct Phone: [phone] Toll Free: [phone] Fax: [phone]

## 2023-04-27 NOTE — Telephone Encounter (Signed)
-----   Message from Lars Mage, [place] sent at 04/26/2023  7:25 AM EDT ----- Jeffrey Hudson f/u in 4 weeks with CTA Abd/Pelvis with Jeffrey Hudson

## 2023-04-28 ENCOUNTER — Telehealth: Payer: Self-pay

## 2023-04-28 NOTE — Transitions of Care (Post Inpatient/ED Visit) (Signed)
04/28/2023  Name: Jeffrey Hudson MRN: [id number] DOB: [birth year]  Today's TOC FU Call Status: Today's TOC FU Call Status:: Successful TOC FU Call Competed TOC FU Call Complete Date: 04/28/23 (Call completed with spouse-spoke briefly as she is currently at work and patient at home.She voices pt has been having issues with his phone and doesn't always get calls.)  Transition Care Management Follow-up Telephone Call Date of Discharge: 04/26/23 Discharge Facility: [facility]) Type of Discharge: Inpatient Admission Primary Inpatient Discharge Diagnosis:: "AAA" How have you been since you were released from the hospital?: Better (Wife vocies things going well. No acute issues. Pain is controlled/managed-states pt not taking pain meds often at all. Icnision is looking good. NoBM since d/c-wife will get OTC stool softener/laxative today for pt.) Any questions or concerns?: Yes Patient Questions/Concerns:: Wife wanting to know when pt can resume driving. Patient Questions/Concerns Addressed: Other: (Discussed with wife no driving while taking prescription pain meds. She states pt not really taking them. Otherwise, d/c instructions does not have exact time frame.They will speak with surgeon office about it when they call today to make follow up appt.)  Items Reviewed: Did you receive and understand the discharge instructions provided?: Yes Medications obtained,verified, and reconciled?: Partial Review Completed Reason for Partial Mediation Review: wife at work-not at home with meds Any new allergies since your discharge?: No Dietary orders reviewed?: Yes Type of Diet Ordered:: low salt/heart healthy Do you have support at home?: Yes People in Home: spouse Name of Support/Comfort Primary Source: [name]  Medications Reviewed Today: Medications Reviewed Today     Reviewed by Charlyn Minerva, RN (Registered Nurse) on 04/28/23 at [phone]  Med List Status: <None>   Medication  Order Taking? Sig Documenting Provider Last Dose Status Informant  albuterol (PROAIR HFA) 108 (90 Base) MCG/ACT inhaler [id number]  Inhale 2 puffs into the lungs every 6 (six) hours as needed. Oretha Milch, MD  Active Self  aspirin 81 MG tablet [id number]  Take 81 mg by mouth daily. [provider]  Active Self  betamethasone dipropionate 0.05 % cream [id number]  Apply 1 application  topically as needed (for eczema). [provider]  Active Self  clobetasol ointment (TEMOVATE) 0.05 % [id number]  Apply 1 application  topically 2 (two) times daily as needed (rash). [provider]  Active Self  diclofenac Sodium (VOLTAREN) 1 % GEL [id number]  Apply 1 Application topically 4 (four) times daily as needed (pain). [provider]  Active Self  doxazosin (CARDURA) 2 MG tablet [id number]  Take 1 tablet (2 mg total) by mouth daily. [name] T, PA-C  Active Self  ezetimibe (ZETIA) 10 MG tablet [id number]  TAKE 1 TABLET BY MOUTH DAILY Kathleene Hazel, MD  Active   fluocinonide (LIDEX) 0.05 % external solution [id number]  Apply 1 Application topically 2 (two) times daily as needed (scalp irritation). [provider]  Active Self  FLUoxetine (PROZAC) 20 MG capsule [id number]  Take 1 capsule (20 mg total) by mouth daily. Archer Asa, MD  Active Self           Med Note Sherrie Mustache, [place] A   Fri Apr 15, 2023  9:36 AM) Pt states he takes everyday. Pt checked his bottle, it dates back to 06/09/22. Pt can't explain how he takes daily with an rx that hasn't been filled in 10 months  hydrochlorothiazide (HYDRODIURIL) 25 MG tablet [id number]  Take 1 tablet (25 mg total) by mouth daily. [name] T, PA-C  Active Self  latanoprost (XALATAN) 0.005 % ophthalmic solution [id number]  Place 1 drop into both eyes at bedtime. [provider]  Active Self  meloxicam (MOBIC) 7.5 MG tablet [id number]  TAKE 1 TABLET BY MOUTH  DAILY  Patient taking differently: Take 7.5 mg by  mouth daily as needed for pain.   Nafziger, Kandee Keen, NP  Active Self  metoprolol succinate (TOPROL-XL) 25 MG 24 hr tablet [id number]  Take 1 tablet (25 mg total) by mouth daily. Kathleene Hazel, MD  Active Self  Multiple Vitamin (MULTIVITAMIN) tablet [id number]  Take 1 tablet by mouth daily. [provider]  Active Self  nitroGLYCERIN (NITROSTAT) 0.4 MG SL tablet [id number]  Place 1 tablet (0.4 mg total) under the tongue every 5 (five) minutes as needed for chest pain. [name] T, PA-C  Active Self  oxyCODONE-acetaminophen (PERCOCET/ROXICET) 5-325 MG tablet [id number] Yes Take 1 tablet by mouth every 4 (four) hours as needed for moderate pain. Clinton Gallant M, PA-C Taking Active   oxymetazoline (AFRIN) 0.05 % nasal spray [id number]  Place 1 spray into both nostrils 2 (two) times daily as needed for congestion. [provider]  Active Self  pantoprazole (PROTONIX) 40 MG tablet [id number]  TAKE 1 TABLET BY MOUTH DAILY  BEFORE BREAKFAST Iva Boop, MD  Active Self           Med Note Sherrie Mustache, [place] A   Fri Apr 15, 2023  9:41 AM) Pt states he takes everyday. Pt checked his bottle, it dates back to 11/23. Pt can't explain how he takes daily with an rx that hasn't been filled in 7 months   rosuvastatin (CRESTOR) 40 MG tablet [id number]  TAKE 1 TABLET BY MOUTH DAILY [name] T, PA-C  Active Self  timolol (BETIMOL) 0.5 % ophthalmic solution [id number]  Place 1 drop into both eyes daily. [provider]  Active Self            Home Care and Equipment/Supplies: Were Home Health Services Ordered?: NA Any new equipment or medical supplies ordered?: NA  Functional Questionnaire: Do you need assistance with bathing/showering or dressing?: No Do you need assistance with meal preparation?: No Do you need assistance with eating?: No Do you have difficulty maintaining continence: No Do you need assistance with getting out of bed/getting out of a chair/moving?: No Do  you have difficulty managing or taking your medications?: No  Follow up appointments reviewed: PCP Follow-up appointment confirmed?: No (Wife at work-not with pt-will call office to make appts later) MD Provider Line Number:[phone] Given: No Specialist Hospital Follow-up appointment confirmed?: No Reason Specialist Follow-Up Not Confirmed: Patient has Specialist Provider Number and will Call for Appointment (Advised wife that surgeon office has been trying to reach pt to make appt. She will advise pt and call office to follow up.) Do you need transportation to your follow-up appointment?: No Do you understand care options if your condition(s) worsen?: Yes-patient verbalized understanding  SDOH Interventions Today    Flowsheet Row Most Recent Value  SDOH Interventions   Transportation Interventions Intervention Not Indicated      TOC Interventions Today    Flowsheet Row Most Recent Value  TOC Interventions   TOC Interventions Discussed/Reviewed TOC Interventions Discussed, Post op wound/incision care, S/S of infection, Post discharge activity limitations per provider      Interventions Today    Flowsheet Row Most Recent Value  General Interventions   General Interventions Discussed/Reviewed General Interventions Discussed, Doctor Visits  Doctor Visits Discussed/Reviewed Doctor  Visits Discussed, PCP, Specialist  PCP/Specialist Visits Compliance with follow-up visit  Education Interventions   Education Provided Provided Education  Provided Verbal Education On Nutrition, When to see the doctor, Other, Medication  [pain mgmt, bowel mgmt]  Nutrition Interventions   Nutrition Discussed/Reviewed Nutrition Discussed, Adding fruits and vegetables, Increasing proteins, Decreasing salt, Fluid intake  Pharmacy Interventions   Pharmacy Dicussed/Reviewed Pharmacy Topics Discussed, Medications and their functions  Safety Interventions   Safety Discussed/Reviewed Safety Discussed        Alessandra Grout [facility] Care Management Care Management Community Coordinator Direct Phone: [phone] Toll Free: [phone] Fax: [phone]

## 2023-04-29 ENCOUNTER — Telehealth: Payer: Self-pay

## 2023-04-29 NOTE — Telephone Encounter (Signed)
Pt called c/o significant bruising at his genitalia after surgery and wants to know if there is cause for concern.  Reviewed pt's chart, returned call for clarification, no answer, lf detailed msg per DPR. Informed pt that bruising is common and as long as it's soft when palpated and he was able to urinate, there is no concern. Explained that bruising takes time to go away. Instructed him to use a warm compress and elevate if there is any swelling.

## 2023-04-29 NOTE — Patient Outreach (Signed)
  Care Coordination   Follow Up Visit Note   04/29/2023 Name: GARDINER SYDOW MRN: [id number] DOB: Mar 20, 1951   Incoming call from patient. Patient states he took RN CM advice regarding bowel regimen and was able to have a small BM yesterday evening. He will continue to take med and increase fiber intake to ensure he is having a BM at least every 3 days. He voices that he woke up and noticed that he was having some swelling and discoloration/bruising to groin/genital area. He states it is a  little uncomfortable. Advised patient to contact surgeon office ASAP and/or seek medical attention for evaluation. He voices he will call surgeon office first to get guidance.     Care Coordination Interventions:  Yes, provided    TOC Interventions Today    Flowsheet Row Most Recent Value  TOC Interventions   TOC Interventions Discussed/Reviewed TOC Interventions Discussed, Post op wound/incision care, S/S of infection      Interventions Today    Flowsheet Row Most Recent Value  General Interventions   General Interventions Discussed/Reviewed General Interventions Reviewed  PCP/Specialist Visits Compliance with follow-up visit  Education Interventions   Provided Verbal Education On Medication, When to see the doctor, Other  [sx mgmt]        Follow up plan:  Patient aware he can contact RN CM as needed.    Encounter Outcome:  Pt. Visit Completed    Alessandra Grout [facility] Care Management Care Management Community Coordinator Direct Phone: [phone] Toll Free: [phone] Fax: [phone]

## 2023-05-01 NOTE — Discharge Summary (Signed)
[facility] Discharge Summary   Patient ID:  Jeffrey Hudson MRN: [id number] DOB/AGE: 1950-10-26 72 y.o.  Admit date: 04/25/2023 Discharge date: 04/26/23 Date of Surgery: 04/25/2023 Surgeon: Surgeon(s): Cephus Shelling, MD  Admission Diagnosis: AAA (abdominal aortic aneurysm) [facility]) [I71.40]  Discharge Diagnoses:  AAA (abdominal aortic aneurysm) (HCC) [I71.40]  Secondary Diagnoses: Past Medical History:  Diagnosis Date   AAA (abdominal aortic aneurysm) (HCC)    AMI (acute myocardial infarction) (HCC)    Acute ST elevation   Anxiety    Arthritis    neck   Bipolar disorder (HCC)    COPD (chronic obstructive pulmonary disease) (HCC)    Coronary artery disease    a. s/p anterolateral STEMI with BMS placed in large diagonal branch (2009).   Depression    Emphysema of lung (HCC)    GERD (gastroesophageal reflux disease)    Glaucoma    History of kidney stones    Hx of adenomatous polyp of colon 05/06/2021   diminutive - no f/u needed   Hyperlipidemia    Hypertension    Nodule of left lung    6-mm smooothly rounded nodule   Plantar fasciitis    left foot   Pre-diabetes    Syncope and collapse    in 2001 and 2004 with no clear cause   Tobacco abuse    Viral URI with cough 11/22/2016    Procedure(s): ABDOMINAL AORTIC ENDOVASCULAR STENT GRAFT WITH RIGHT ILIAC BRANCH ENDOPROSTHESIS ULTRASOUND GUIDANCE FOR VASCULAR ACCESS  Discharged Condition: stable and improved  HPI: Patient is a 72 year old male seen with evidence of a large right common iliac aneurysm that measures 5.4 cm by CT as well as a small 3.8 cm abdominal aortic aneurysm.    Hospital Course:  Jeffrey Hudson is a 72 y.o. male is S/P  Procedure(s): POD # 1   1.  Ultrasound-guided access bilateral common femoral arteries with Perclose closure device 2.  Aortogram with iliac artery arteriogram with catheter selection of abdominal aorta 3.  Right iliac branch endoprosthesis (IBE) for  treatment of right common iliac artery aneurysm (CPT 34717) 4.  Placement of aortobiiliac endograft for treatment of abdominal aortic aneurysm (CPT 34705) 5.  Placement of extension stent graft for endovascular repair of abdominal aortic aneurysm and right common iliac artery aneurysm (CPT [phone]) Day of Surgery   Palpable pedal pulses Groins soft without hematoma  Stable post op disposition Plan for discharge home today with 4 week f/u with CTA    Significant Diagnostic Studies: CBC Lab Results  Component Value Date   WBC 8.9 04/26/2023   HGB 9.3 (L) 04/26/2023   HCT 27.1 (L) 04/26/2023   MCV 95.8 04/26/2023   PLT 139 (L) 04/26/2023    BMET    Component Value Date/Time   NA 132 (L) 04/26/2023 0330   NA 138 03/15/2023 0948   K 3.4 (L) 04/26/2023 0330   CL 102 04/26/2023 0330   CO2 22 04/26/2023 0330   GLUCOSE 249 (H) 04/26/2023 0330   BUN 18 04/26/2023 0330   BUN 17 03/15/2023 0948   CREATININE 1.05 04/26/2023 0330   CREATININE 0.86 05/01/2020 1004   CALCIUM 7.9 (L) 04/26/2023 0330   GFRNONAA >60 04/26/2023 0330   GFRAA 96 08/05/2020 0842   COAG Lab Results  Component Value Date   INR 1.1 04/25/2023   INR 0.9 04/20/2023   INR 0.87 02/10/2010     Disposition:  Discharge to :Home Discharge Instructions     Call  MD for:  redness, tenderness, or signs of infection (pain, swelling, bleeding, redness, odor or green/yellow discharge around incision site)   Complete by: As directed    Call MD for:  severe or increased pain, loss or decreased feeling  in affected limb(s)   Complete by: As directed    Call MD for:  temperature >100.5   Complete by: As directed    Resume previous diet   Complete by: As directed       Allergies as of 04/26/2023       Reactions   Lisinopril Swelling   Angio edema    Metformin And Related Diarrhea   Norvasc [amlodipine] Swelling   Lower extremity         Medication List     TAKE these medications    albuterol 108 (90  Base) MCG/ACT inhaler Commonly known as: ProAir HFA Inhale 2 puffs into the lungs every 6 (six) hours as needed.   aspirin 81 MG tablet Take 81 mg by mouth daily.   betamethasone dipropionate 0.05 % cream Apply 1 application  topically as needed (for eczema).   clobetasol ointment 0.05 % Commonly known as: TEMOVATE Apply 1 application  topically 2 (two) times daily as needed (rash).   doxazosin 2 MG tablet Commonly known as: Cardura Take 1 tablet (2 mg total) by mouth daily.   ezetimibe 10 MG tablet Commonly known as: ZETIA TAKE 1 TABLET BY MOUTH DAILY   fluocinonide 0.05 % external solution Commonly known as: LIDEX Apply 1 Application topically 2 (two) times daily as needed (scalp irritation).   FLUoxetine 20 MG capsule Commonly known as: PROZAC Take 1 capsule (20 mg total) by mouth daily.   hydrochlorothiazide 25 MG tablet Commonly known as: HYDRODIURIL Take 1 tablet (25 mg total) by mouth daily.   latanoprost 0.005 % ophthalmic solution Commonly known as: XALATAN Place 1 drop into both eyes at bedtime.   meloxicam 7.5 MG tablet Commonly known as: MOBIC TAKE 1 TABLET BY MOUTH  DAILY What changed:  when to take this reasons to take this   metoprolol succinate 25 MG 24 hr tablet Commonly known as: TOPROL-XL Take 1 tablet (25 mg total) by mouth daily.   multivitamin tablet Take 1 tablet by mouth daily.   nitroGLYCERIN 0.4 MG SL tablet Commonly known as: NITROSTAT Place 1 tablet (0.4 mg total) under the tongue every 5 (five) minutes as needed for chest pain.   oxyCODONE-acetaminophen 5-325 MG tablet Commonly known as: PERCOCET/ROXICET Take 1 tablet by mouth every 4 (four) hours as needed for moderate pain.   oxymetazoline 0.05 % nasal spray Commonly known as: AFRIN Place 1 spray into both nostrils 2 (two) times daily as needed for congestion.   pantoprazole 40 MG tablet Commonly known as: PROTONIX TAKE 1 TABLET BY MOUTH DAILY  BEFORE BREAKFAST    rosuvastatin 40 MG tablet Commonly known as: CRESTOR TAKE 1 TABLET BY MOUTH DAILY   timolol 0.5 % ophthalmic solution Commonly known as: BETIMOL Place 1 drop into both eyes daily.   Voltaren 1 % Gel Generic drug: diclofenac Sodium Apply 1 Application topically 4 (four) times daily as needed (pain).       Verbal and written Discharge instructions given to the patient. Wound care per Discharge AVS  Follow-up Information     Cephus Shelling, MD Follow up in 4 week(s).   Specialty: Vascular Surgery Why: Office will call you to arrange your appt (sent) Contact information: [address] [place] [address]  [phone]                 Signed: Mosetta Pigeon 05/01/2023, 8:08 AM

## 2023-05-03 ENCOUNTER — Telehealth: Payer: Self-pay

## 2023-05-03 NOTE — Telephone Encounter (Signed)
Caller: Patient  Concern: continued BL groin pain, at times a stabbing sensation, non-improving back pain, complaints of increased flatulence, requested pain med refill  Pt reports some constipation, used Miralax twice  Location: Back, BL groin  Description:  since surgery  Quality: shooting and stabbing  Treatments: narcotic analgesics including oxycodone/acetaminophen (Percocet, Tylox)  Instructed pt to supplement with Tylenol, take OTC gas remedy, stay well hydrated, take stool softener since Miralax can cause bloating  Procedure: Interventional Vascular Procedure  Resolution: Appointment scheduled for first available, pt unable to come in today and Instructed to call back if symptoms perist or if severe to go to ED  Next Appt: Appointment scheduled for 7/18 @ 0915

## 2023-05-05 ENCOUNTER — Other Ambulatory Visit: Payer: Self-pay

## 2023-05-05 ENCOUNTER — Ambulatory Visit: Payer: Medicare Other

## 2023-05-05 VITALS — BP 139/75 | HR 67 | Temp 98.5°F | Ht 66.0 in | Wt 145.2 lb

## 2023-05-05 DIAGNOSIS — I7143 Infrarenal abdominal aortic aneurysm, without rupture: Secondary | ICD-10-CM

## 2023-05-05 DIAGNOSIS — I723 Aneurysm of iliac artery: Secondary | ICD-10-CM

## 2023-05-05 MED ORDER — OXYCODONE-ACETAMINOPHEN 5-325 MG PO TABS: 1.0000 | ORAL_TABLET | ORAL | 0 refills | Status: DC | PRN

## 2023-05-05 NOTE — Progress Notes (Signed)
POST OPERATIVE OFFICE NOTE    CC:  F/u for surgery  HPI:  This is a 72 y.o. male who is s/p endovascular repair of abdominal aortic aneurysm as well as iliac branch device for right common iliac artery aneurysm by Dr. Chestine Spore on 04/25/2023.  He tolerated the procedure well and was discharged from the hospital on postoperative day #1.  Patient states he developed pain in both groins after several days at home.  Groin pain is worse when bending or standing from sitting.  He also experienced some bruising which migrated into his scrotum.  Sometimes pain in the groins can radiate into the flank however he denies any significant back pain.  He is also having trouble with constipation.  He denies any claudication or rest pain of his feet or legs.  He is on aspirin and statin daily.  He believes his groin incisions are healed.  He has not yet had his postoperative CT scan.  He is accompanied today by his wife.  Allergies  Allergen Reactions   Lisinopril Swelling    Angio edema    Metformin And Related Diarrhea   Norvasc [Amlodipine] Swelling    Lower extremity     Current Outpatient Medications  Medication Sig Dispense Refill   albuterol (PROAIR HFA) 108 (90 Base) MCG/ACT inhaler Inhale 2 puffs into the lungs every 6 (six) hours as needed. 1 Inhaler 3   aspirin 81 MG tablet Take 81 mg by mouth daily.     betamethasone dipropionate 0.05 % cream Apply 1 application  topically as needed (for eczema).     clobetasol ointment (TEMOVATE) 0.05 % Apply 1 application  topically 2 (two) times daily as needed (rash).     diclofenac Sodium (VOLTAREN) 1 % GEL Apply 1 Application topically 4 (four) times daily as needed (pain).     doxazosin (CARDURA) 2 MG tablet Take 1 tablet (2 mg total) by mouth daily. 90 tablet 3   ezetimibe (ZETIA) 10 MG tablet TAKE 1 TABLET BY MOUTH DAILY 90 tablet 3   fluocinonide (LIDEX) 0.05 % external solution Apply 1 Application topically 2 (two) times daily as needed (scalp  irritation).     FLUoxetine (PROZAC) 20 MG capsule Take 1 capsule (20 mg total) by mouth daily. 90 capsule 2   hydrochlorothiazide (HYDRODIURIL) 25 MG tablet Take 1 tablet (25 mg total) by mouth daily. 90 tablet 3   latanoprost (XALATAN) 0.005 % ophthalmic solution Place 1 drop into both eyes at bedtime.     meloxicam (MOBIC) 7.5 MG tablet TAKE 1 TABLET BY MOUTH  DAILY (Patient taking differently: Take 7.5 mg by mouth daily as needed for pain.) 90 tablet 3   metoprolol succinate (TOPROL-XL) 25 MG 24 hr tablet Take 1 tablet (25 mg total) by mouth daily. 90 tablet 3   Multiple Vitamin (MULTIVITAMIN) tablet Take 1 tablet by mouth daily.     nitroGLYCERIN (NITROSTAT) 0.4 MG SL tablet Place 1 tablet (0.4 mg total) under the tongue every 5 (five) minutes as needed for chest pain. 25 tablet 11   oxymetazoline (AFRIN) 0.05 % nasal spray Place 1 spray into both nostrils 2 (two) times daily as needed for congestion.     pantoprazole (PROTONIX) 40 MG tablet TAKE 1 TABLET BY MOUTH DAILY  BEFORE BREAKFAST 90 tablet 3   rosuvastatin (CRESTOR) 40 MG tablet TAKE 1 TABLET BY MOUTH DAILY 90 tablet 3   timolol (BETIMOL) 0.5 % ophthalmic solution Place 1 drop into both eyes daily.  oxyCODONE-acetaminophen (PERCOCET/ROXICET) 5-325 MG tablet Take 1 tablet by mouth every 4 (four) hours as needed for moderate pain. 20 tablet 0   No current facility-administered medications for this visit.     ROS:  See HPI  Physical Exam:  Vitals:   05/05/23 0924  BP: 139/75  Pulse: 67  Temp: 98.5 F (36.9 C)  SpO2: 97%  Weight: 145 lb 3.2 oz (65.9 kg)  Height: 5\' 6"  (1.676 m)    Incision: Incisions of the groins well-healed with no palpable fluid collections, hematoma, or bruising Extremities: 2+ palpable PT pulses bilaterally Neuro: A&O Abdomen:  soft, NT, ND  Assessment/Plan:  This is a 72 y.o. male who is s/p: Endovascular repair of abdominal aortic aneurysm as well as iliac branch device to repair right common  iliac artery aneurysm by Dr. Chestine Spore on 04/25/2023  -Bilateral lower extremities are well-perfused with palpable PT pulses.  Both groin incisions are completely healed without any palpable fluid collection or hematoma.  All bruising has resolved.  Nothing seen on exam that would contribute to bilateral groin pain.  I encouraged him to walk is much as possible during the day.  He should continue to avoid any heavy lifting.  He will continue daily MiraLAX to help with constipation.  I will try to have his CTA performed in the next week or 2.  I asked him to call/return office if he develops any back or abdominal pain.  He can continue his aspirin and statin daily.  It should also be noted that I refilled his narcotic pain medication.   Emilie Rutter, PA-C [facility] [phone]  Clinic MD:  Edilia Bo

## 2023-05-06 ENCOUNTER — Telehealth: Payer: Self-pay

## 2023-05-06 NOTE — Telephone Encounter (Signed)
Pt called to ask about drug to drug interactions. He has been advised to call pharmacist to ask these very specific questions, as they would likely be best to advise him and to call us back should he need further assistance. Pt verbalized understanding and will call us if he has further questions/concerns.

## 2023-05-24 ENCOUNTER — Ambulatory Visit (HOSPITAL_COMMUNITY)
Admission: RE | Admit: 2023-05-24 | Discharge: 2023-05-24 | Disposition: A | Discharge status: Home / Self Care | Payer: Medicare Other | Source: Ambulatory Visit | Attending: Vascular Surgery | Admitting: Vascular Surgery

## 2023-05-24 DIAGNOSIS — I7143 Infrarenal abdominal aortic aneurysm, without rupture: Secondary | ICD-10-CM | POA: Insufficient documentation

## 2023-05-24 DIAGNOSIS — N281 Cyst of kidney, acquired: Secondary | ICD-10-CM | POA: Diagnosis not present

## 2023-05-24 DIAGNOSIS — I723 Aneurysm of iliac artery: Secondary | ICD-10-CM | POA: Diagnosis not present

## 2023-05-24 DIAGNOSIS — M180 Bilateral primary osteoarthritis of first carpometacarpal joints: Secondary | ICD-10-CM | POA: Diagnosis not present

## 2023-05-24 DIAGNOSIS — K5730 Diverticulosis of large intestine without perforation or abscess without bleeding: Secondary | ICD-10-CM | POA: Diagnosis not present

## 2023-05-24 MED ORDER — IOHEXOL 350 MG/ML SOLN
75.0000 mL | Freq: Once | INTRAVENOUS | Status: AC | PRN
  Administered 2023-05-24: 75 mL via INTRAVENOUS

## 2023-05-27 ENCOUNTER — Ambulatory Visit: Payer: Medicare Other

## 2023-05-27 VITALS — Ht 66.0 in | Wt 145.0 lb

## 2023-05-27 DIAGNOSIS — Z0000 Encounter for general adult medical examination without abnormal findings: Secondary | ICD-10-CM

## 2023-05-27 NOTE — Progress Notes (Signed)
Subjective:   Jeffrey Hudson is a 72 y.o. male who presents for Medicare Annual/Subsequent preventive examination.  Visit Complete: Virtual  I connected with  [name] on 05/27/23 by a audio enabled telemedicine application and verified that I am speaking with the correct person using two identifiers.  Patient Location: Home  Provider Location: Home Office  I discussed the limitations of evaluation and management by telemedicine. The patient expressed understanding and agreed to proceed.  Patient Medicare AWV questionnaire was completed by the patient on 05/25/23; I have confirmed that all information answered by patient is correct and no changes since this date.  Review of Systems    Vital Signs: Unable to obtain new vitals due to this being a telehealth visit.  Cardiac Risk Factors include: advanced age (>62men, >9 women);male gender;hypertension     Objective:    Today's Vitals   05/27/23 1035  Weight: 145 lb (65.8 kg)  Height: 5\' 6"  (1.676 m)   Body mass index is 23.4 kg/m.     05/27/2023   10:46 AM 04/25/2023    8:19 AM 04/20/2023    9:05 AM 07/06/2022   11:30 AM 06/02/2021   10:29 AM 01/21/2021   10:14 AM  Advanced Directives  Does Patient Have a Medical Advance Directive? Yes Yes Yes Yes Yes Yes  Type of Estate agent of [place];Living will Living will Healthcare Power of [place];Living will Healthcare Power of [place];Living will Living will   Does patient want to make changes to medical advance directive? No - Patient declined No - Patient declined No - Patient declined     Copy of Healthcare Power of Attorney in Chart? Yes - validated most recent copy scanned in chart (See row information)  No - copy requested No - copy requested      Current Medications (verified) Outpatient Encounter Medications as of 05/27/2023  Medication Sig   albuterol (PROAIR HFA) 108 (90 Base) MCG/ACT inhaler Inhale 2 puffs into the lungs every 6 (six) hours  as needed.   aspirin 81 MG tablet Take 81 mg by mouth daily.   betamethasone dipropionate 0.05 % cream Apply 1 application  topically as needed (for eczema).   clobetasol ointment (TEMOVATE) 0.05 % Apply 1 application  topically 2 (two) times daily as needed (rash).   diclofenac Sodium (VOLTAREN) 1 % GEL Apply 1 Application topically 4 (four) times daily as needed (pain).   doxazosin (CARDURA) 2 MG tablet Take 1 tablet (2 mg total) by mouth daily.   ezetimibe (ZETIA) 10 MG tablet TAKE 1 TABLET BY MOUTH DAILY   fluocinonide (LIDEX) 0.05 % external solution Apply 1 Application topically 2 (two) times daily as needed (scalp irritation).   FLUoxetine (PROZAC) 20 MG capsule Take 1 capsule (20 mg total) by mouth daily.   hydrochlorothiazide (HYDRODIURIL) 25 MG tablet Take 1 tablet (25 mg total) by mouth daily.   latanoprost (XALATAN) 0.005 % ophthalmic solution Place 1 drop into both eyes at bedtime.   meloxicam (MOBIC) 7.5 MG tablet TAKE 1 TABLET BY MOUTH  DAILY (Patient taking differently: Take 7.5 mg by mouth daily as needed for pain.)   metoprolol succinate (TOPROL-XL) 25 MG 24 hr tablet Take 1 tablet (25 mg total) by mouth daily.   Multiple Vitamin (MULTIVITAMIN) tablet Take 1 tablet by mouth daily.   nitroGLYCERIN (NITROSTAT) 0.4 MG SL tablet Place 1 tablet (0.4 mg total) under the tongue every 5 (five) minutes as needed for chest pain.   oxyCODONE-acetaminophen (PERCOCET/ROXICET)  5-325 MG tablet Take 1 tablet by mouth every 4 (four) hours as needed for moderate pain.   oxymetazoline (AFRIN) 0.05 % nasal spray Place 1 spray into both nostrils 2 (two) times daily as needed for congestion.   pantoprazole (PROTONIX) 40 MG tablet TAKE 1 TABLET BY MOUTH DAILY  BEFORE BREAKFAST   rosuvastatin (CRESTOR) 40 MG tablet TAKE 1 TABLET BY MOUTH DAILY   timolol (BETIMOL) 0.5 % ophthalmic solution Place 1 drop into both eyes daily.   No facility-administered encounter medications on file as of 05/27/2023.     Allergies (verified) Lisinopril, Metformin and related, and Norvasc [amlodipine]   History: Past Medical History:  Diagnosis Date   AAA (abdominal aortic aneurysm) (HCC)    AMI (acute myocardial infarction) (HCC)    Acute ST elevation   Anxiety    Arthritis    neck   Bipolar disorder (HCC)    COPD (chronic obstructive pulmonary disease) (HCC)    Coronary artery disease    a. s/p anterolateral STEMI with BMS placed in large diagonal branch (2009).   Depression    Emphysema of lung (HCC)    GERD (gastroesophageal reflux disease)    Glaucoma    History of kidney stones    Hx of adenomatous polyp of colon 05/06/2021   diminutive - no f/u needed   Hyperlipidemia    Hypertension    Nodule of left lung    6-mm smooothly rounded nodule   Plantar fasciitis    left foot   Pre-diabetes    Syncope and collapse    in 2001 and 2004 with no clear cause   Tobacco abuse    Viral URI with cough 11/22/2016   Past Surgical History:  Procedure Laterality Date   ABDOMINAL AORTIC ENDOVASCULAR STENT GRAFT Bilateral 04/25/2023   Procedure: ABDOMINAL AORTIC ENDOVASCULAR STENT GRAFT WITH RIGHT ILIAC BRANCH ENDOPROSTHESIS;  Surgeon: Cephus Shelling, MD;  Location: [facility] OR;  Service: Vascular;  Laterality: Bilateral;   ABDOMINAL SURGERY  04/25/2023   COLONOSCOPY     PERCUTANEOUS CORONARY STENT INTERVENTION (PCI-S)     TONSILLECTOMY     removed as a child, also removed uvula   ULTRASOUND GUIDANCE FOR VASCULAR ACCESS Bilateral 04/25/2023   Procedure: ULTRASOUND GUIDANCE FOR VASCULAR ACCESS;  Surgeon: Cephus Shelling, MD;  Location: [facility] OR;  Service: Vascular;  Laterality: Bilateral;   UPPER GASTROINTESTINAL ENDOSCOPY     Family History  Problem Relation Age of Onset   Dementia Mother 10       died   Alcohol abuse Father 46   Depression Brother    Healthy Son    Colon cancer Neg Hx    Esophageal cancer Neg Hx    Rectal cancer Neg Hx    Stomach cancer Neg Hx    Social History    Socioeconomic History   Marital status: Married    Spouse name: Not on file   Number of children: 1   Years of education: Not on file   Highest education level: Bachelor's degree (e.g., BA, AB, BS)  Occupational History   Occupation: Retired-Sales/broker  Tobacco Use   Smoking status: Every Day    Current packs/day: 0.25    Average packs/day: 1 pack/day for 54.6 years (54.2 ttl pk-yrs)    Types: Cigarettes    Start date: 1970   Smokeless tobacco: Never   Tobacco comments:    Patient states he's down to smoking 3 cigarettes a day as of 01/05/23 ep  Vaping Use  Vaping status: Never Used  Substance and Sexual Activity   Alcohol use: Yes    Alcohol/week: 14.0 standard drinks of alcohol    Types: 14 Shots of liquor per week   Drug use: Not Currently   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[employer]   He has been married for 35 years    One child (son)       He likes to [employer] - plays once a week    2 glasses of wine a day max, still smoking 2 cups of coffee a day no drug use or other tobacco   Social Determinants of Corporate investment banker Strain: Low Risk  (05/27/2023)   Overall Financial Resource Strain (CARDIA)    Difficulty of Paying Living Expenses: Not hard at all  Food Insecurity: No Food Insecurity (05/27/2023)   Hunger Vital Sign    Worried About Running Out of Food in the Last Year: Never true    Ran Out of Food in the Last Year: Never true  Transportation Needs: No Transportation Needs (05/27/2023)   PRAPARE - Administrator, Civil Service (Medical): No    Lack of Transportation (Non-Medical): No  Physical Activity: Insufficiently Active (05/27/2023)   Exercise Vital Sign    Days of Exercise per Week: 3 days    Minutes of Exercise per Session: 20 min  Stress: No Stress Concern Present (05/27/2023)   [employer] of  Occupational Health - Occupational Stress Questionnaire    Feeling of Stress : Not at all  Social Connections: Moderately Integrated (05/27/2023)   Social Connection and Isolation Panel [NHANES]    Frequency of Communication with Friends and Family: More than three times a week    Frequency of Social Gatherings with Friends and Family: Three times a week    Attends Religious Services: Never    Active Member of Clubs or Organizations: Yes    Attends Banker Meetings: Never    Marital Status: Married    Tobacco Counseling Ready to quit: Not Answered Counseling given: Not Answered Tobacco comments: Patient states he's down to smoking 3 cigarettes a day as of 01/05/23 ep   Clinical Intake:  Pre-visit preparation completed: Yes  Pain : No/denies pain     BMI - recorded: 23.4 Nutritional Status: BMI of 19-24  Normal Nutritional Risks: None Diabetes: No  How often do you need to have someone help you when you read instructions, pamphlets, or other written materials from your doctor or pharmacy?: 1 - Never  Interpreter Needed?: No  Information entered by :: Theresa Mulligan LPN   Activities of Daily Living    05/27/2023   10:45 AM 05/27/2023   10:44 AM  In your present state of health, do you have any difficulty performing the following activities:  Hearing? 0 0  Vision? 0 0  Difficulty concentrating or making decisions? 0 0  Walking or climbing stairs? 0 0  Dressing or bathing? 0 0  Doing errands, shopping? 0 0  Preparing Food and eating ? N N  Using the Toilet? N N  In the past six months, have you accidently leaked urine? N N  Do you have problems with loss of bowel control? N N  Managing your Medications? N N  Managing your Finances? N N  Housekeeping or managing your Housekeeping? N N  Patient Care Team: Shirline Frees, NP as PCP - General (Family Medicine) Kathleene Hazel, MD as PCP - Cardiology (Cardiology)  Indicate any recent Medical  Services you may have received from other than [facility] providers in the past year (date may be approximate).     Assessment:   This is a routine wellness examination for Neng.  Hearing/Vision screen Hearing Screening - Comments:: Denies hearing difficulties   Vision Screening - Comments:: Wears rx glasses - up to date with routine eye exams with  Dr Shea Evans  Dietary issues and exercise activities discussed:     Goals Addressed               This Visit's Progress     Stay healthy (pt-stated)         Depression Screen    05/27/2023   10:44 AM 05/27/2023   10:43 AM 03/08/2023   10:04 AM 07/06/2022   11:31 AM 06/02/2021   10:28 AM 03/11/2021    9:03 AM 02/04/2015    9:35 AM  PHQ 2/9 Scores  PHQ - 2 Score 0 0 0 0 0 2 0  PHQ- 9 Score   0   2     Fall Risk    05/27/2023   10:45 AM 05/25/2023    7:39 AM 03/08/2023   10:04 AM 12/08/2022    9:31 AM 07/06/2022   11:31 AM  Fall Risk   Falls in the past year? 1 1 0  0  Number falls in past yr: 0 0 0 0 0  Injury with Fall? 0 0 0 0 0  Risk for fall due to : No Fall Risks;Impaired balance/gait  No Fall Risks No Fall Risks Medication side effect  Follow up Falls prevention discussed  Falls evaluation completed Falls evaluation completed Falls prevention discussed;Education provided;Falls evaluation completed    MEDICARE RISK AT HOME:  Medicare Risk at Home - 05/27/23 1050     Any stairs in or around the home? Yes    If so, are there any without handrails? No    Home free of loose throw rugs in walkways, pet beds, electrical cords, etc? Yes    Adequate lighting in your home to reduce risk of falls? Yes    Life alert? No    Use of a cane, walker or w/c? No    Grab bars in the bathroom? No    Shower chair or bench in shower? No    Elevated toilet seat or a handicapped toilet? No             TIMED UP AND GO:  Was the test performed?  No    Cognitive Function:        05/27/2023   10:46 AM 07/06/2022   11:32 AM 06/02/2021    10:31 AM  6CIT Screen  What Year? 0 points 0 points 0 points  What month? 0 points 0 points 0 points  What time? 0 points 0 points 0 points  Count back from 20 0 points 0 points 0 points  Months in reverse 4 points 4 points 0 points  Repeat phrase 0 points 0 points 0 points  Total Score 4 points 4 points 0 points    Immunizations Immunization History  Administered Date(s) Administered   COVID-19, mRNA, vaccine(Comirnaty)12 years and older 10/04/2022   Fluad Quad(high Dose 65+) 06/21/2019   Influenza, High Dose Seasonal PF 08/06/2016, 08/23/2017, 06/29/2018, 06/21/2019   Influenza,inj,Quad PF,6+ Mos 08/03/2013, 09/21/2014, 07/28/2021   Influenza-Unspecified 11/03/2014,  09/07/2017, 07/18/2020   PFIZER Comirnaty(Gray Top)Covid-19 Tri-Sucrose Vaccine 01/31/2021   PFIZER(Purple Top)SARS-COV-2 Vaccination 12/02/2019, 12/25/2019, 07/25/2020, 01/31/2021, 09/03/2021   Pneumococcal Conjugate-13 02/04/2015   Pneumococcal Polysaccharide-23 08/06/2016   Td 02/04/2015   Zoster Recombinant(Shingrix) 06/21/2019, 10/05/2019   Zoster, Live 04/04/2015    TDAP status: Up to date  Flu Vaccine status: Due, Education has been provided regarding the importance of this vaccine. Advised may receive this vaccine at local pharmacy or Health Dept. Aware to provide a copy of the vaccination record if obtained from local pharmacy or Health Dept. Verbalized acceptance and understanding.  Pneumococcal vaccine status: Up to date  Covid-19 vaccine status: Declined, Education has been provided regarding the importance of this vaccine but patient still declined. Advised may receive this vaccine at local pharmacy or Health Dept.or vaccine clinic. Aware to provide a copy of the vaccination record if obtained from local pharmacy or Health Dept. Verbalized acceptance and understanding.  Qualifies for Shingles Vaccine? Yes   Zostavax completed Yes   Shingrix Completed?: Yes  Screening Tests Health Maintenance  Topic  Date Due   COVID-19 Vaccine (8 - 2023-24 season) 02/03/2023   INFLUENZA VACCINE  05/19/2023   Lung Cancer Screening  09/30/2023   Medicare Annual Wellness (AWV)  05/26/2024   DTaP/Tdap/Td (2 - Tdap) 02/03/2025   Pneumonia Vaccine 13+ Years old  Completed   Hepatitis C Screening  Completed   Zoster Vaccines- Shingrix  Completed   HPV VACCINES  Aged Out   Colonoscopy  Discontinued    Health Maintenance  Health Maintenance Due  Topic Date Due   COVID-19 Vaccine (8 - 2023-24 season) 02/03/2023   INFLUENZA VACCINE  05/19/2023      Lung Cancer Screening: (Low Dose CT Chest recommended if Age 3-80 years, 20 pack-year currently smoking OR have quit w/in 15years.) does not qualify.     Additional Screening:  Hepatitis C Screening: does qualify; Completed 09/02/15  Vision Screening: Recommended annual ophthalmology exams for early detection of glaucoma and other disorders of the eye. Is the patient up to date with their annual eye exam?  Yes  Who is the provider or what is the name of the office in which the patient attends annual eye exams? DR Shea Evans If pt is not established with a provider, would they like to be referred to a provider to establish care? No .   Dental Screening: Recommended annual dental exams for proper oral hygiene    Community Resource Referral / Chronic Care Management:  CRR required this visit?  No   CCM required this visit?  No     Plan:     I have personally reviewed and noted the following in the patient's chart:   Medical and social history Use of alcohol, tobacco or illicit drugs  Current medications and supplements including opioid prescriptions. Patient is currently taking opioid prescriptions. Information provided to patient regarding non-opioid alternatives. Patient advised to discuss non-opioid treatment plan with their provider. Functional ability and status Nutritional status Physical activity Advanced directives List of other  physicians Hospitalizations, surgeries, and ER visits in previous 12 months Vitals Screenings to include cognitive, depression, and falls Referrals and appointments  In addition, I have reviewed and discussed with patient certain preventive protocols, quality metrics, and best practice recommendations. A written personalized care plan for preventive services as well as general preventive health recommendations were provided to patient.     Tillie Rung, LPN   10/23/1094   After Visit Summary: (MyChart) Due to this  being a telephonic visit, the after visit summary with patients personalized plan was offered to patient via MyChart   Nurse Notes: None

## 2023-05-27 NOTE — Patient Instructions (Addendum)
Jeffrey Hudson , Thank you for taking time to come for your Medicare Wellness Visit. I appreciate your ongoing commitment to your health goals. Please review the following plan we discussed and let me know if I can assist you in the future.   Referrals/Orders/Follow-Ups/Clinician Recommendations:   This is a list of the screening recommended for you and due dates:  Health Maintenance  Topic Date Due   COVID-19 Vaccine (8 - 2023-24 season) 02/03/2023   Flu Shot  05/19/2023   Screening for Lung Cancer  09/30/2023   Medicare Annual Wellness Visit  05/26/2024   DTaP/Tdap/Td vaccine (2 - Tdap) 02/03/2025   Pneumonia Vaccine  Completed   Hepatitis C Screening  Completed   Zoster (Shingles) Vaccine  Completed   HPV Vaccine  Aged Out   Colon Cancer Screening  Discontinued   Opioid Pain Medicine Management Opioids are powerful medicines that are used to treat moderate to severe pain. When used for short periods of time, they can help you to: Sleep better. Do better in physical or occupational therapy. Feel better in the first few days after an injury. Recover from surgery. Opioids should be taken with the supervision of a trained health care provider. They should be taken for the shortest period of time possible. This is because opioids can be addictive, and the longer you take opioids, the greater your risk of addiction. This addiction can also be called opioid use disorder. What are the risks? Using opioid pain medicines for longer than 3 days increases your risk of side effects. Side effects include: Constipation. Nausea and vomiting. Breathing difficulties (respiratory depression). Drowsiness. Confusion. Opioid use disorder. Itching. Taking opioid pain medicine for a long period of time can affect your ability to do daily tasks. It also puts you at risk for: Motor vehicle crashes. Depression. Suicide. Heart attack. Overdose, which can be life-threatening. What is a pain treatment  plan? A pain treatment plan is an agreement between you and your health care provider. Pain is unique to each person, and treatments vary depending on your condition. To manage your pain, you and your health care provider need to work together. To help you do this: Discuss the goals of your treatment, including how much pain you might expect to have and how you will manage the pain. Review the risks and benefits of taking opioid medicines. Remember that a good treatment plan uses more than one approach and minimizes the chance of side effects. Be honest about the amount of medicines you take and about any drug or alcohol use. Get pain medicine prescriptions from only one health care provider. Pain can be managed with many types of alternative treatments. Ask your health care provider to refer you to one or more specialists who can help you manage pain through: Physical or occupational therapy. Counseling (cognitive behavioral therapy). Good nutrition. Biofeedback. Massage. Meditation. Non-opioid medicine. Following a gentle exercise program. How to use opioid pain medicine Taking medicine Take your pain medicine exactly as told by your health care provider. Take it only when you need it. If your pain gets less severe, you may take less than your prescribed dose if your health care provider approves. If you are not having pain, do nottake pain medicine unless your health care provider tells you to take it. If your pain is severe, do nottry to treat it yourself by taking more pills than instructed on your prescription. Contact your health care provider for help. Write down the times when you take your  pain medicine. It is easy to become confused while on pain medicine. Writing the time can help you avoid overdose. Take other over-the-counter or prescription medicines only as told by your health care provider. Keeping yourself and others safe  While you are taking opioid pain medicine: Do not  drive, use machinery, or power tools. Do not sign legal documents. Do not drink alcohol. Do not take sleeping pills. Do not supervise children by yourself. Do not do activities that require climbing or being in high places. Do not go to a lake, river, ocean, spa, or swimming pool. Do not share your pain medicine with anyone. Keep pain medicine in a locked cabinet or in a secure area where pets and children cannot reach it. Stopping your use of opioids If you have been taking opioid medicine for more than a few weeks, you may need to slowly decrease (taper) how much you take until you stop completely. Tapering your use of opioids can decrease your risk of symptoms of withdrawal, such as: Pain and cramping in the abdomen. Nausea. Sweating. Sleepiness. Restlessness. Uncontrollable shaking (tremors). Cravings for the medicine. Do not attempt to taper your use of opioids on your own. Talk with your health care provider about how to do this. Your health care provider may prescribe a step-down schedule based on how much medicine you are taking and how long you have been taking it. Getting rid of leftover pills Do not save any leftover pills. Get rid of leftover pills safely by: Taking the medicine to a prescription take-back program. This is usually offered by the county or law enforcement. Bringing them to a pharmacy that has a drug disposal container. Flushing them down the toilet. Check the label or package insert of your medicine to see whether this is safe to do. Throwing them out in the trash. Check the label or package insert of your medicine to see whether this is safe to do. If it is safe to throw it out, remove the medicine from the original container, put it into a sealable bag or container, and mix it with used coffee grounds, food scraps, dirt, or cat litter before putting it in the trash. Follow these instructions at home: Activity Do exercises as told by your health care  provider. Avoid activities that make your pain worse. Return to your normal activities as told by your health care provider. Ask your health care provider what activities are safe for you. General instructions You may need to take these actions to prevent or treat constipation: Drink enough fluid to keep your urine pale yellow. Take over-the-counter or prescription medicines. Eat foods that are high in fiber, such as beans, whole grains, and fresh fruits and vegetables. Limit foods that are high in fat and processed sugars, such as fried or sweet foods. Keep all follow-up visits. This is important. Where to find support If you have been taking opioids for a long time, you may benefit from receiving support for quitting from a local support group or counselor. Ask your health care provider for a referral to these resources in your area. Where to find more information Centers for Disease Control and Prevention (CDC): FootballExhibition.com.br [place] Food and Drug Administration (FDA): PumpkinSearch.com.ee Get help right away if: You may have taken too much of an opioid (overdosed). Common symptoms of an overdose: Your breathing is slower or more shallow than normal. You have a very slow heartbeat (pulse). You have slurred speech. You have nausea and vomiting. Your pupils become very  small. You have other potential symptoms: You are very confused. You faint or feel like you will faint. You have cold, clammy skin. You have blue lips or fingernails. You have thoughts of harming yourself or harming others. These symptoms may represent a serious problem that is an emergency. Do not wait to see if the symptoms will go away. Get medical help right away. Call your local emergency services (911 in the [place] Do not drive yourself to the hospital.  If you ever feel like you may hurt yourself or others, or have thoughts about taking your own life, get help right away. Go to your nearest emergency department or: Call your  local emergency services (911 in the [place] Call the [facility] (762-318-5654 in the [place] Call a suicide crisis helpline, such as the National Suicide Prevention Lifeline at 938-660-3119 or 988 in the [place] This is open 24 hours a day in the [place] Text the Crisis Text Line at 5152866463 (in the [place] Summary Opioid medicines can help you manage moderate to severe pain for a short period of time. A pain treatment plan is an agreement between you and your health care provider. Discuss the goals of your treatment, including how much pain you might expect to have and how you will manage the pain. If you think that you or someone else may have taken too much of an opioid, get medical help right away. This information is not intended to replace advice given to you by your health care provider. Make sure you discuss any questions you have with your health care provider. Document Revised: 04/29/2021 Document Reviewed: 01/14/2021 Elsevier Patient Education  2024 [employer]  Advanced directives: (In Chart) A copy of your advanced directives are scanned into your chart should your provider ever need it.  Next Medicare Annual Wellness Visit scheduled for next year: Yes  Preventive Care 29 Years and Older, Male  Preventive care refers to lifestyle choices and visits with your health care provider that can promote health and wellness. What does preventive care include? A yearly physical exam. This is also called an annual well check. Dental exams once or twice a year. Routine eye exams. Ask your health care provider how often you should have your eyes checked. Personal lifestyle choices, including: Daily care of your teeth and gums. Regular physical activity. Eating a healthy diet. Avoiding tobacco and drug use. Limiting alcohol use. Practicing safe sex. Taking low doses of aspirin every day. Taking vitamin and mineral supplements as recommended by your health care  provider. What happens during an annual well check? The services and screenings done by your health care provider during your annual well check will depend on your age, overall health, lifestyle risk factors, and family history of disease. Counseling  Your health care provider may ask you questions about your: Alcohol use. Tobacco use. Drug use. Emotional well-being. Home and relationship well-being. Sexual activity. Eating habits. History of falls. Memory and ability to understand (cognition). Work and work Astronomer. Screening  You may have the following tests or measurements: Height, weight, and BMI. Blood pressure. Lipid and cholesterol levels. These may be checked every 5 years, or more frequently if you are over 34 years old. Skin check. Lung cancer screening. You may have this screening every year starting at age 64 if you have a 30-pack-year history of smoking and currently smoke or have quit within the past 15 years. Fecal occult blood test (FOBT) of the stool. You may have this test  every year starting at age 19. Flexible sigmoidoscopy or colonoscopy. You may have a sigmoidoscopy every 5 years or a colonoscopy every 10 years starting at age 44. Prostate cancer screening. Recommendations will vary depending on your family history and other risks. Hepatitis C blood test. Hepatitis B blood test. Sexually transmitted disease (STD) testing. Diabetes screening. This is done by checking your blood sugar (glucose) after you have not eaten for a while (fasting). You may have this done every 1-3 years. Abdominal aortic aneurysm (AAA) screening. You may need this if you are a current or former smoker. Osteoporosis. You may be screened starting at age 59 if you are at high risk. Talk with your health care provider about your test results, treatment options, and if necessary, the need for more tests. Vaccines  Your health care provider may recommend certain vaccines, such  as: Influenza vaccine. This is recommended every year. Tetanus, diphtheria, and acellular pertussis (Tdap, Td) vaccine. You may need a Td booster every 10 years. Zoster vaccine. You may need this after age 29. Pneumococcal 13-valent conjugate (PCV13) vaccine. One dose is recommended after age 57. Pneumococcal polysaccharide (PPSV23) vaccine. One dose is recommended after age 75. Talk to your health care provider about which screenings and vaccines you need and how often you need them. This information is not intended to replace advice given to you by your health care provider. Make sure you discuss any questions you have with your health care provider. Document Released: 10/31/2015 Document Revised: 06/23/2016 Document Reviewed: 08/05/2015 Elsevier Interactive Patient Education  2017 [employer].  Fall Prevention in the Home Falls can cause injuries. They can happen to people of all ages. There are many things you can do to make your home safe and to help prevent falls. What can I do on the outside of my home? Regularly fix the edges of walkways and driveways and fix any cracks. Remove anything that might make you trip as you walk through a door, such as a raised step or threshold. Trim any bushes or trees on the path to your home. Use bright outdoor lighting. Clear any walking paths of anything that might make someone trip, such as rocks or tools. Regularly check to see if handrails are loose or broken. Make sure that both sides of any steps have handrails. Any raised decks and porches should have guardrails on the edges. Have any leaves, snow, or ice cleared regularly. Use sand or salt on walking paths during winter. Clean up any spills in your garage right away. This includes oil or grease spills. What can I do in the bathroom? Use night lights. Install grab bars by the toilet and in the tub and shower. Do not use towel bars as grab bars. Use non-skid mats or decals in the tub or  shower. If you need to sit down in the shower, use a plastic, non-slip stool. Keep the floor dry. Clean up any water that spills on the floor as soon as it happens. Remove soap buildup in the tub or shower regularly. Attach bath mats securely with double-sided non-slip rug tape. Do not have throw rugs and other things on the floor that can make you trip. What can I do in the bedroom? Use night lights. Make sure that you have a light by your bed that is easy to reach. Do not use any sheets or blankets that are too big for your bed. They should not hang down onto the floor. Have a firm chair that has  side arms. You can use this for support while you get dressed. Do not have throw rugs and other things on the floor that can make you trip. What can I do in the kitchen? Clean up any spills right away. Avoid walking on wet floors. Keep items that you use a lot in easy-to-reach places. If you need to reach something above you, use a strong step stool that has a grab bar. Keep electrical cords out of the way. Do not use floor polish or wax that makes floors slippery. If you must use wax, use non-skid floor wax. Do not have throw rugs and other things on the floor that can make you trip. What can I do with my stairs? Do not leave any items on the stairs. Make sure that there are handrails on both sides of the stairs and use them. Fix handrails that are broken or loose. Make sure that handrails are as long as the stairways. Check any carpeting to make sure that it is firmly attached to the stairs. Fix any carpet that is loose or worn. Avoid having throw rugs at the top or bottom of the stairs. If you do have throw rugs, attach them to the floor with carpet tape. Make sure that you have a light switch at the top of the stairs and the bottom of the stairs. If you do not have them, ask someone to add them for you. What else can I do to help prevent falls? Wear shoes that: Do not have high heels. Have  rubber bottoms. Are comfortable and fit you well. Are closed at the toe. Do not wear sandals. If you use a stepladder: Make sure that it is fully opened. Do not climb a closed stepladder. Make sure that both sides of the stepladder are locked into place. Ask someone to hold it for you, if possible. Clearly mark and make sure that you can see: Any grab bars or handrails. First and last steps. Where the edge of each step is. Use tools that help you move around (mobility aids) if they are needed. These include: Canes. Walkers. Scooters. Crutches. Turn on the lights when you go into a dark area. Replace any light bulbs as soon as they burn out. Set up your furniture so you have a clear path. Avoid moving your furniture around. If any of your floors are uneven, fix them. If there are any pets around you, be aware of where they are. Review your medicines with your doctor. Some medicines can make you feel dizzy. This can increase your chance of falling. Ask your doctor what other things that you can do to help prevent falls. This information is not intended to replace advice given to you by your health care provider. Make sure you discuss any questions you have with your health care provider. Document Released: 07/31/2009 Document Revised: 03/11/2016 Document Reviewed: 11/08/2014 Elsevier Interactive Patient Education  2017 [employer].

## 2023-05-30 NOTE — Progress Notes (Unsigned)
Patient name: Jeffrey [place] MRN: [id number] DOB: 1951/07/27 Sex: male  REASON FOR CONSULT: Post-op after EVAR, Right iliac branch device  HPI: [place] is a 72 y.o. male, with hx COPD, CAD s/p STEMI, HTN, HLD, tobacco abuse that presents for postop check after recent EVAR with right iliac branch endoprosthesis.  On 04/25/2023 he underwent EVAR plus right iliac branch endoprosthesis for a 3.8 cm AAA and 5.4 cm right common iliac aneurysm.  No complaints today.  His groins have healed without issue.  Past Medical History:  Diagnosis Date   AAA (abdominal aortic aneurysm) (HCC)    AMI (acute myocardial infarction) (HCC)    Acute ST elevation   Anxiety    Arthritis    neck   Bipolar disorder (HCC)    COPD (chronic obstructive pulmonary disease) (HCC)    Coronary artery disease    a. s/p anterolateral STEMI with BMS placed in large diagonal branch (2009).   Depression    Emphysema of lung (HCC)    GERD (gastroesophageal reflux disease)    Glaucoma    History of kidney stones    Hx of adenomatous polyp of colon 05/06/2021   diminutive - no f/u needed   Hyperlipidemia    Hypertension    Nodule of left lung    6-mm smooothly rounded nodule   Plantar fasciitis    left foot   Pre-diabetes    Syncope and collapse    in 2001 and 2004 with no clear cause   Tobacco abuse    Viral URI with cough 11/22/2016    Past Surgical History:  Procedure Laterality Date   ABDOMINAL AORTIC ENDOVASCULAR STENT GRAFT Bilateral 04/25/2023   Procedure: ABDOMINAL AORTIC ENDOVASCULAR STENT GRAFT WITH RIGHT ILIAC BRANCH ENDOPROSTHESIS;  Surgeon: Cephus Shelling, MD;  Location: [facility] OR;  Service: Vascular;  Laterality: Bilateral;   ABDOMINAL SURGERY  04/25/2023   COLONOSCOPY     PERCUTANEOUS CORONARY STENT INTERVENTION (PCI-S)     TONSILLECTOMY     removed as a child, also removed uvula   ULTRASOUND GUIDANCE FOR VASCULAR ACCESS Bilateral 04/25/2023   Procedure: ULTRASOUND GUIDANCE FOR  VASCULAR ACCESS;  Surgeon: Cephus Shelling, MD;  Location: [facility] OR;  Service: Vascular;  Laterality: Bilateral;   UPPER GASTROINTESTINAL ENDOSCOPY      Family History  Problem Relation Age of Onset   Dementia Mother 37       died   Alcohol abuse Father 49   Depression Brother    Healthy Son    Colon cancer Neg Hx    Esophageal cancer Neg Hx    Rectal cancer Neg Hx    Stomach cancer Neg Hx     SOCIAL HISTORY: Social History   Socioeconomic History   Marital status: Married    Spouse name: Not on file   Number of children: 1   Years of education: Not on file   Highest education level: Bachelor's degree (e.g., BA, AB, BS)  Occupational History   Occupation: Retired-Sales/broker  Tobacco Use   Smoking status: Every Day    Current packs/day: 0.25    Average packs/day: 1 pack/day for 54.6 years (54.2 ttl pk-yrs)    Types: Cigarettes    Start date: 1970   Smokeless tobacco: Never   Tobacco comments:    Patient states he's down to smoking 3 cigarettes a day as of 01/05/23 ep  Vaping Use   Vaping status: Never Used  Substance and Sexual Activity   Alcohol  use: Yes    Alcohol/week: 14.0 standard drinks of alcohol    Types: 14 Shots of liquor per week   Drug use: Not Currently   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[employer]   He has been married for 35 years    One child (son)       He likes to [employer] - plays once a week    2 glasses of wine a day max, still smoking 2 cups of coffee a day no drug use or other tobacco   Social Determinants of Corporate investment banker Strain: Low Risk  (05/27/2023)   Overall Financial Resource Strain (CARDIA)    Difficulty of Paying Living Expenses: Not hard at all  Food Insecurity: No Food Insecurity (05/27/2023)   Hunger Vital Sign    Worried About Running Out of Food in the Last Year: Never true    Ran  Out of Food in the Last Year: Never true  Transportation Needs: No Transportation Needs (05/27/2023)   PRAPARE - Administrator, Civil Service (Medical): No    Lack of Transportation (Non-Medical): No  Physical Activity: Insufficiently Active (05/27/2023)   Exercise Vital Sign    Days of Exercise per Week: 3 days    Minutes of Exercise per Session: 20 min  Stress: No Stress Concern Present (05/27/2023)   [employer] of Occupational Health - Occupational Stress Questionnaire    Feeling of Stress : Not at all  Social Connections: Moderately Integrated (05/27/2023)   Social Connection and Isolation Panel [NHANES]    Frequency of Communication with Friends and Family: More than three times a week    Frequency of Social Gatherings with Friends and Family: Three times a week    Attends Religious Services: Never    Active Member of Clubs or Organizations: Yes    Attends Banker Meetings: Never    Marital Status: Married  Catering manager Violence: Not At Risk (05/27/2023)   Humiliation, Afraid, Rape, and Kick questionnaire    Fear of Current or Ex-Partner: No    Emotionally Abused: No    Physically Abused: No    Sexually Abused: No    Allergies  Allergen Reactions   Lisinopril Swelling    Angio edema    Metformin And Related Diarrhea   Norvasc [Amlodipine] Swelling    Lower extremity     Current Outpatient Medications  Medication Sig Dispense Refill   albuterol (PROAIR HFA) 108 (90 Base) MCG/ACT inhaler Inhale 2 puffs into the lungs every 6 (six) hours as needed. 1 Inhaler 3   aspirin 81 MG tablet Take 81 mg by mouth daily.     betamethasone dipropionate 0.05 % cream Apply 1 application  topically as needed (for eczema).     clobetasol ointment (TEMOVATE) 0.05 % Apply 1 application  topically 2 (two) times daily as needed (rash).     diclofenac Sodium (VOLTAREN) 1 % GEL Apply 1 Application topically 4 (four) times daily as needed (pain).     doxazosin  (CARDURA) 2 MG tablet Take 1 tablet (2 mg total) by mouth daily. 90 tablet 3   ezetimibe (ZETIA) 10 MG tablet TAKE 1 TABLET BY MOUTH DAILY 90 tablet 3   fluocinonide (LIDEX) 0.05 % external solution Apply 1 Application topically 2 (two) times daily as needed (scalp irritation).  FLUoxetine (PROZAC) 20 MG capsule Take 1 capsule (20 mg total) by mouth daily. 90 capsule 2   hydrochlorothiazide (HYDRODIURIL) 25 MG tablet Take 1 tablet (25 mg total) by mouth daily. 90 tablet 3   latanoprost (XALATAN) 0.005 % ophthalmic solution Place 1 drop into both eyes at bedtime.     meloxicam (MOBIC) 7.5 MG tablet TAKE 1 TABLET BY MOUTH  DAILY (Patient taking differently: Take 7.5 mg by mouth daily as needed for pain.) 90 tablet 3   metoprolol succinate (TOPROL-XL) 25 MG 24 hr tablet Take 1 tablet (25 mg total) by mouth daily. 90 tablet 3   Multiple Vitamin (MULTIVITAMIN) tablet Take 1 tablet by mouth daily.     nitroGLYCERIN (NITROSTAT) 0.4 MG SL tablet Place 1 tablet (0.4 mg total) under the tongue every 5 (five) minutes as needed for chest pain. 25 tablet 11   oxyCODONE-acetaminophen (PERCOCET/ROXICET) 5-325 MG tablet Take 1 tablet by mouth every 4 (four) hours as needed for moderate pain. 20 tablet 0   oxymetazoline (AFRIN) 0.05 % nasal spray Place 1 spray into both nostrils 2 (two) times daily as needed for congestion.     pantoprazole (PROTONIX) 40 MG tablet TAKE 1 TABLET BY MOUTH DAILY  BEFORE BREAKFAST 90 tablet 3   rosuvastatin (CRESTOR) 40 MG tablet TAKE 1 TABLET BY MOUTH DAILY 90 tablet 3   timolol (BETIMOL) 0.5 % ophthalmic solution Place 1 drop into both eyes daily.     No current facility-administered medications for this visit.    REVIEW OF SYSTEMS:  [X]  denotes positive finding, [ ]  denotes negative finding Cardiac  Comments:  Chest pain or chest pressure:    Shortness of breath upon exertion:    Short of breath when lying flat:    Irregular heart rhythm:        Vascular    Pain in  calf, thigh, or hip brought on by ambulation:    Pain in feet at night that wakes you up from your sleep:     Blood clot in your veins:    Leg swelling:         Pulmonary    Oxygen at home:    Productive cough:     Wheezing:         Neurologic    Sudden weakness in arms or legs:     Sudden numbness in arms or legs:     Sudden onset of difficulty speaking or slurred speech:    Temporary loss of vision in one eye:     Problems with dizziness:         Gastrointestinal    Blood in stool:     Vomited blood:         Genitourinary    Burning when urinating:     Blood in urine:        Psychiatric    Major depression:         Hematologic    Bleeding problems:    Problems with blood clotting too easily:        Skin    Rashes or ulcers:        Constitutional    Fever or chills:      PHYSICAL EXAM: There were no vitals filed for this visit.   GENERAL: The patient is a well-nourished male, in no acute distress. The vital signs are documented above. CARDIAC: There is a regular rate and rhythm.  VASCULAR:  Bilateral femoral pulses palpable Right DP palpable and left  PT palpable ABDOMEN: Soft and non-tender.  No pain with deep palpation of aneurysm.   DATA:   CTA reviewed 05/24/2023 with both the infrarenal stent graft and right iliac branch endoprosthesis in good position.  All the limbs are widely patent.  Both internal iliac arteries are patent.  There is a delayed type II endoleak likely from a lumbar.  Assessment/Plan:  72 y.o. male, with hx COPD, CAD s/p STEMI in 2009, HTN, HLD, tobacco abuse that presents for postop after EVAR and right iliac branch endoprosthesis on 04/25/2023 for 3.8 cm AAA and 5.4 cm right common iliac aneurysm.  Discussed that his CTA shows his stent graft's are in excellent position.  The infrarenal aortic stent graft and right iliac branch endoprosthesis are widely patent.  There is a very delayed type II endoleak likely from a lumbar.  I discussed  type II endoleaks are common in up to 1 in 4 cases and can be safely observed unless there is growth of the aneurysm sac.  I will plan to continue surveillance with an EVAR duplex in 6 months.  Discussed he call with questions or concerns.   Cephus Shelling, MD [facility] Office: [phone]

## 2023-05-31 ENCOUNTER — Encounter: Payer: Self-pay | Admitting: Vascular Surgery

## 2023-05-31 ENCOUNTER — Ambulatory Visit (INDEPENDENT_AMBULATORY_CARE_PROVIDER_SITE_OTHER): Payer: Medicare Other | Admitting: Vascular Surgery

## 2023-05-31 VITALS — BP 154/82 | HR 54 | Temp 98.4°F | Resp 16 | Ht 66.0 in | Wt 148.0 lb

## 2023-05-31 DIAGNOSIS — I7143 Infrarenal abdominal aortic aneurysm, without rupture: Secondary | ICD-10-CM

## 2023-05-31 DIAGNOSIS — I723 Aneurysm of iliac artery: Secondary | ICD-10-CM

## 2023-06-03 ENCOUNTER — Other Ambulatory Visit: Payer: Self-pay

## 2023-06-03 DIAGNOSIS — I7143 Infrarenal abdominal aortic aneurysm, without rupture: Secondary | ICD-10-CM

## 2023-06-15 ENCOUNTER — Ambulatory Visit: Payer: Medicare Other | Admitting: Physician Assistant

## 2023-06-20 ENCOUNTER — Other Ambulatory Visit (HOSPITAL_COMMUNITY): Payer: Self-pay | Admitting: Psychiatry

## 2023-06-28 DIAGNOSIS — H401131 Primary open-angle glaucoma, bilateral, mild stage: Secondary | ICD-10-CM | POA: Diagnosis not present

## 2023-07-11 NOTE — Progress Notes (Unsigned)
Cardiology Office Note:    Date:  07/12/2023  ID:  [name], DOB 1951/04/14, MRN [id number] PCP: Shirline Frees, NP  [facility] Providers Cardiologist:  Verne Carrow, MD       Patient Profile:      Coronary artery disease  Ant-Lat STEMI in 2009 s/p BMS to Lg Dx branch Cath 4/11: Dx stent patent w 40-50 ISR, LAD several 20, LCx 50, dRCA 30, PLA 60 Myoview 12/19/14 - no ischemia COPD +Cigs Hypertension  Hyperlipidemia  Pre-Diabetes mellitus  Bipolar D/o  Abdominal aortic aneurysm and R CIA aneurysm  s/p EVAR in 04/2023 (Dr. Chestine Spore) Type II endoleak - followed w [place]        History of Present Illness:  Discussed the use of AI scribe software for clinical note transcription with the patient, who gave verbal consent to proceed.   [place] is a 72 y.o. male who returns for follow-up of CAD, hypertension.  He was last seen in May 2024.  Blood pressure is above target.  I increased his doxazosin.  Since that time, he was diagnosed with a large right common iliac artery aneurysm.  He was evaluated by vascular surgery and underwent endovascular repair of AAA and right common iliac artery aneurysm in July 2024.  He is here alone. He reports no issues since his surgery and denies any chest discomfort or shortness of breath. He continues to smoke, albeit reduced to about two cigarettes a day. He has not had syncope, edema.     ROS: See HPI    Studies Reviewed:       Risk Assessment/Calculations:     HYPERTENSION CONTROL Vitals:   07/12/23 0744 07/12/23 0821  BP: (!) 156/80 (!) 160/80    The patient's blood pressure is elevated above target today.  In order to address the patient's elevated BP: A new medication was prescribed today.          Physical Exam:   VS:  BP (!) 160/80   Pulse 60   Ht 5\' 6"  (1.676 m)   Wt 147 lb 6.4 oz (66.9 kg)   SpO2 97%   BMI 23.79 kg/m    Wt Readings from Last 3 Encounters:  07/12/23 147 lb 6.4 oz (66.9 kg)   05/31/23 148 lb (67.1 kg)  05/27/23 145 lb (65.8 kg)    Constitutional:      Appearance: Healthy appearance. Not in distress.  Neck:     Vascular: JVD normal.  Pulmonary:     Breath sounds: Normal breath sounds. No wheezing. No rales.  Cardiovascular:     Normal rate. Regular rhythm.     Murmurs: There is no murmur.  Edema:    Peripheral edema absent.  Abdominal:     Palpations: Abdomen is soft.       Assessment and Plan:     Hypertension Uncontrolled despite current regimen of Doxazosin 2mg  daily, Hydrochlorothiazide 25mg  daily, and Toprol XL 25mg  daily. Patient has a history of edema with Amlodipine and a true allergy to ACE inhibitors with angioedema. -Discontinue Hydrochlorothiazide and start Chlorthalidone 25mg  daily. -Discontinue Toprol XL and start Carvedilol 3.125mg  twice daily. -Consider adding Amlodipine 2.5mg  or Spironolactone 25mg  if blood pressure remains uncontrolled. -Check BMET today and repeat in one week. -Request patient to send blood pressure readings in two weeks via MyChart.  Hyperlipidemia LDL above goal despite current regimen of Zetia 10mg  daily and Crestor 40mg  daily. -He has been referred to lipid clinic for consideration of PCSK9  inhibitor therapy.  Coronary Artery Disease Status post anterolateral STEMI in 2009, treated with a bare metal stent to the large diagonal branch.  Cardiac cath in 2011 with patent stent in the diagonal and mild to moderate nonobstructive disease elsewhere.  Nuclear stress test in 2016 negative for ischemia.  He is not having chest discomfort to suggest angina. -Continue aspirin 81 mg daily Crestor 40 mg daily  Tobacco Use Patient reports smoking approximately two cigarettes per day. -Encourage continued efforts to quit smoking.  Abdominal Aortic Aneurysm and Right Common Iliac Artery Aneurysm Status post EVAR in July 2024. No current issues reported. -Continue current management.        Dispo:  Return in about 3  months (around 10/11/2023) for Routine Follow Up, w/ [name], PA-C.  Signed, [name], PA-C

## 2023-07-12 ENCOUNTER — Ambulatory Visit: Payer: Medicare Other

## 2023-07-12 ENCOUNTER — Telehealth: Payer: Self-pay | Admitting: Pharmacy Technician

## 2023-07-12 ENCOUNTER — Other Ambulatory Visit (HOSPITAL_COMMUNITY): Payer: Self-pay

## 2023-07-12 ENCOUNTER — Ambulatory Visit: Payer: Medicare Other | Admitting: Pharmacist

## 2023-07-12 ENCOUNTER — Encounter: Payer: Self-pay | Admitting: Physician Assistant

## 2023-07-12 ENCOUNTER — Ambulatory Visit: Payer: Medicare Other | Attending: Physician Assistant | Admitting: Physician Assistant

## 2023-07-12 VITALS — BP 160/80 | HR 60 | Ht 66.0 in | Wt 147.4 lb

## 2023-07-12 DIAGNOSIS — E7800 Pure hypercholesterolemia, unspecified: Secondary | ICD-10-CM

## 2023-07-12 DIAGNOSIS — I2510 Atherosclerotic heart disease of native coronary artery without angina pectoris: Secondary | ICD-10-CM

## 2023-07-12 DIAGNOSIS — I10 Essential (primary) hypertension: Secondary | ICD-10-CM | POA: Diagnosis not present

## 2023-07-12 DIAGNOSIS — Z720 Tobacco use: Secondary | ICD-10-CM | POA: Diagnosis not present

## 2023-07-12 DIAGNOSIS — I723 Aneurysm of iliac artery: Secondary | ICD-10-CM | POA: Diagnosis not present

## 2023-07-12 MED ORDER — CHLORTHALIDONE 25 MG PO TABS: 25.0000 mg | ORAL_TABLET | Freq: Every day | ORAL | 3 refills | Status: DC

## 2023-07-12 MED ORDER — CARVEDILOL 3.125 MG PO TABS: 3.1250 mg | ORAL_TABLET | Freq: Two times a day (BID) | ORAL | 3 refills | Status: DC

## 2023-07-12 NOTE — Telephone Encounter (Signed)
Pharmacy Patient Advocate Encounter   Received notification from Pt Calls Messages that prior authorization for repatha is required/requested.   Insurance verification completed.   The patient is insured through [facility] .   Per test claim: PA required; PA started via CoverMyMeds. KEY [id number] . Waiting for clinical questions to populate.

## 2023-07-12 NOTE — Patient Instructions (Signed)
I will submit a prior authorization for Repatha. I will call you once I hear back. Please call me at [phone] with any questions.   Repatha is a cholesterol medication that improved your body's ability to get rid of "bad cholesterol" known as LDL. It can lower your LDL up to 60%! It is an injection that is given under the skin every 2 weeks. The medication often requires a prior authorization from your insurance company. We will take care of submitting all the necessary information to your insurance company to get it approved. The most common side effects of Repatha include runny nose, symptoms of the common cold, rarely flu or flu-like symptoms, back/muscle pain in about 3-4% of the patients, and redness, pain, or bruising at the injection site. Tell your healthcare provider if you have any side effect that bothers you or that does not go away.   Please try to cut back on alcohol Please increase exercise. Goal is a total of 150 min per week Continue banded exercises Continue working on quitting smoking

## 2023-07-12 NOTE — Assessment & Plan Note (Addendum)
Assessment: LDL-C above goal less than 55 on rosuvastatin 40 mg and ezetimibe 10 mg daily Patient walks for about 30 minutes a few times a week.  We reviewed the definition of moderate exercise and I encouraged him to increase his frequency He does do banded exercises that he got from physical therapy His wife has a degree in nutrition.  Tries to eat healthy does not eat any fried or fast food Reviewed Repatha. Discussed mechanisms of action, dosing, side effects and potential decreases in LDL cholesterol.  Also reviewed cost information.  Currently not in the coverage gap. May be able to replace ezetimibe if LDL-C is low enough Has been working on cutting back smoking. down to 2 cigarettes/day from a pack a day.  I congratulated him on his success  Plan: Will submit prior authorization for Repatha We will call patient with cost once approved Labs in 2 to 3 months Continue rosuvastatin and ezetimibe Increase physical activity Cut back on alcohol Quit smoking

## 2023-07-12 NOTE — Telephone Encounter (Signed)
Called pt and detailed LVM. Repatha approved, cost is $47. Asked pt to call back and let me know if this cost is ok.

## 2023-07-12 NOTE — Progress Notes (Signed)
Patient ID: Jeffrey Hudson                 DOB: [birth year]                    MRN: [id number]      HPI: Jeffrey Hudson is a 72 y.o. male patient referred to lipid clinic by  Jeffrey Newcomer, PA. PMH is significant for coronary artery disease status post STEMI in 2009 with BMS, COPD, hypertension, hyperlipidemia, prediabetes ,aortic aneurysm and iliac artery aneurysm, tobacco use.  Seen by Jeffrey Hudson 03/15/2023. LDL-C at that visit was 72, triglycerides slightly elevated at 154 and LFTs were mildly elevated.  He was referred to lipid clinic.  Since his visit with with Jeffrey Hudson he underwent endovascular repair of abdominal aortic aneurysm as well as iliac branch device for right common iliac artery aneurysm.   Patient presents today to lipid clinic.  He reports tolerating his rosuvastatin and ezetimibe well.  He has been working on cutting back smoking.  Down to about 2 cigarettes/day.  States that he does not have the urge to smoke anymore.  He does smoke with his evening scotch.  Drinks 1 to 2 glasses of scotch daily.  Knows that he should cut back on this.  Did review that his triglycerides and liver enzymes were mildly elevated on last check.  He did get repeat lipid labs today but I suspect they will be similar since no changes have been made the last labs.  Reviewed PCSK9 with the patient. Discussed mechanisms of action, dosing, side effects and potential decreases in LDL cholesterol.  Also reviewed cost information.  Currently not in the coverage gap.  Son is a Biochemist, clinical in [employer].  Going back for his doctorate and OT.  Stations in [facility], patient very proud of his son.  He is originally from [place], [place].  Was in the packing industry.  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  Would like to live for his 50th wedding anniversary and to be a Consulting civil engineer.  Current Medications: rosuvastatin 40 mg daily, ezetimibe 10mg  daily Intolerances:  Risk Factors: STEMI, hypertension, tobacco use,  abdominal aortic aneurysm LDL-C goal: <55 ApoB goal: <70  Diet: his wife has degree in nutrition Veggies, chicken, fish No fried food or fast foods Water, 2 cups of coffee per day  Exercise: walks 30 min 2-3 days a week, band exercises  Family History:  Family History  Problem Relation Age of Onset   Dementia Mother 81       died   Alcohol abuse Father 40   Depression Brother    Healthy Son    Colon cancer Neg Hx    Esophageal cancer Neg Hx    Rectal cancer Neg Hx    Stomach cancer Neg Hx      Social History: scotch 1-2 drinks per day, about 2 cigarettes per day (goal is to completely quit)  Labs: Lipid Panel     Component Value Date/Time   CHOL 173 03/15/2023 0948   TRIG 154 (H) 03/15/2023 0948   HDL 75 03/15/2023 0948   CHOLHDL 2.3 03/15/2023 0948   CHOLHDL 3 01/15/2021 0926   VLDL 32.0 01/15/2021 0926   LDLCALC 72 03/15/2023 0948   LABVLDL 26 03/15/2023 0948    Past Medical History:  Diagnosis Date   AAA (abdominal aortic aneurysm) (HCC)    AMI (acute myocardial infarction) (HCC)    Acute ST elevation   Anxiety  Arthritis    neck   Bipolar disorder (HCC)    COPD (chronic obstructive pulmonary disease) (HCC)    Coronary artery disease    a. s/p anterolateral STEMI with BMS placed in large diagonal branch (2009).   Depression    Emphysema of lung (HCC)    GERD (gastroesophageal reflux disease)    Glaucoma    History of kidney stones    Hx of adenomatous polyp of colon 05/06/2021   diminutive - no f/u needed   Hyperlipidemia    Hypertension    Nodule of left lung    6-mm smooothly rounded nodule   Plantar fasciitis    left foot   Pre-diabetes    Syncope and collapse    in 2001 and 2004 with no clear cause   Tobacco abuse    Viral URI with cough 11/22/2016    Current Outpatient Medications on File Prior to Visit  Medication Sig Dispense Refill   albuterol (PROAIR HFA) 108 (90 Base) MCG/ACT inhaler Inhale 2 puffs into the lungs every 6 (six)  hours as needed. 1 Inhaler 3   aspirin 81 MG tablet Take 81 mg by mouth daily.     betamethasone dipropionate 0.05 % cream Apply 1 application  topically as needed (for eczema).     carvedilol (COREG) 3.125 MG tablet Take 1 tablet (3.125 mg total) by mouth 2 (two) times daily with a meal. 180 tablet 3   chlorthalidone (HYGROTON) 25 MG tablet Take 1 tablet (25 mg total) by mouth daily. 90 tablet 3   clobetasol ointment (TEMOVATE) 0.05 % Apply 1 application  topically 2 (two) times daily as needed (rash).     diclofenac Sodium (VOLTAREN) 1 % GEL Apply 1 Application topically 4 (four) times daily as needed (pain).     doxazosin (CARDURA) 2 MG tablet Take 1 tablet (2 mg total) by mouth daily. 90 tablet 3   ezetimibe (ZETIA) 10 MG tablet TAKE 1 TABLET BY MOUTH DAILY 90 tablet 3   fluocinonide (LIDEX) 0.05 % external solution Apply 1 Application topically 2 (two) times daily as needed (scalp irritation).     FLUoxetine (PROZAC) 20 MG capsule Take 1 capsule (20 mg total) by mouth daily. 90 capsule 2   latanoprost (XALATAN) 0.005 % ophthalmic solution Place 1 drop into both eyes at bedtime.     meloxicam (MOBIC) 7.5 MG tablet TAKE 1 TABLET BY MOUTH  DAILY 90 tablet 3   Multiple Vitamin (MULTIVITAMIN) tablet Take 1 tablet by mouth daily.     nitroGLYCERIN (NITROSTAT) 0.4 MG SL tablet Place 1 tablet (0.4 mg total) under the tongue every 5 (five) minutes as needed for chest pain. 25 tablet 11   oxyCODONE-acetaminophen (PERCOCET/ROXICET) 5-325 MG tablet Take 1 tablet by mouth every 4 (four) hours as needed for moderate pain. 20 tablet 0   oxymetazoline (AFRIN) 0.05 % nasal spray Place 1 spray into both nostrils 2 (two) times daily as needed for congestion.     pantoprazole (PROTONIX) 40 MG tablet TAKE 1 TABLET BY MOUTH DAILY  BEFORE BREAKFAST 90 tablet 3   rosuvastatin (CRESTOR) 40 MG tablet TAKE 1 TABLET BY MOUTH DAILY 90 tablet 3   timolol (BETIMOL) 0.5 % ophthalmic solution Place 1 drop into both eyes  daily.     No current facility-administered medications on file prior to visit.    Allergies  Allergen Reactions   Lisinopril Swelling    Angio edema    Metformin And Related Diarrhea   Norvasc [Amlodipine]  Swelling    Lower extremity     Assessment/Plan:  1. Hyperlipidemia -  Hyperlipidemia Assessment: LDL-C above goal less than 55 on rosuvastatin 40 mg and ezetimibe 10 mg daily Patient walks for about 30 minutes a few times a week.  We reviewed the definition of moderate exercise and I encouraged him to increase his frequency He does do banded exercises that he got from physical therapy His wife has a degree in nutrition.  Tries to eat healthy does not eat any fried or fast food Reviewed Repatha. Discussed mechanisms of action, dosing, side effects and potential decreases in LDL cholesterol.  Also reviewed cost information.  Currently not in the coverage gap. May be able to replace ezetimibe if LDL-C is low enough Has been working on cutting back smoking. down to 2 cigarettes/day from a pack a day.  I congratulated him on his success  Plan: Will submit prior authorization for Repatha We will call patient with cost once approved Labs in 2 to 3 months Continue rosuvastatin and ezetimibe Increase physical activity Cut back on alcohol Quit smoking   Thank you,  Olene Floss, Pharm.D, BCACP, BCPS, CPP [facility] [address], [place], [place] [address]  Phone: [phone]; Fax: [phone]

## 2023-07-12 NOTE — Patient Instructions (Signed)
Medication Instructions:  Your physician has recommended you make the following change in your medication:   STOP Hydrochlorothiazide  STOP Metoprolol  START Chlorthalidone 25 mg taking 1 daily]  START Carvedilol (Coreg) 3.125 take 1 tablet twice a day   *If you need a refill on your cardiac medications before your next appointment, please call your pharmacy*   Lab Work: TODAY:  BMET ORDERED (LFT PREVIOUSLY ORDERED)  07/20/23:  COME BACK TO THE LAB, ANYTIME AFTER 7:15 FOR:  BMET  If you have labs (blood work) drawn today and your tests are completely normal, you will receive your results only by: MyChart Message (if you have MyChart) OR A paper copy in the mail If you have any lab test that is abnormal or we need to change your treatment, we will call you to review the results.   Testing/Procedures: None ordered   Follow-Up: At [facility], you and your health needs are our priority.  As part of our continuing mission to provide you with exceptional heart care, we have created designated Provider Care Teams.  These Care Teams include your primary Cardiologist (physician) and Advanced Practice Providers (APPs -  Physician Assistants and Nurse Practitioners) who all work together to provide you with the care you need, when you need it.  We recommend signing up for the patient portal called "MyChart".  Sign up information is provided on this After Visit Summary.  MyChart is used to connect with patients for Virtual Visits (Telemedicine).  Patients are able to view lab/test results, encounter notes, upcoming appointments, etc.  Non-urgent messages can be sent to your provider as well.   To learn more about what you can do with MyChart, go to [email or web address].    Your next appointment:   3 month(s)  Provider:   [name], PA-C         Other Instructions Your physician has requested that you regularly monitor and record your blood pressure readings at home. Please  use the same machine at the same time of day to check your readings and record them to bring to your follow-up visit.   Please monitor blood pressures and keep a log of your readings for 2 weeks then send a mychart message with there readings.    Make sure to check 2 hours after your medications.    AVOID these things for 30 minutes before checking your blood pressure: No Drinking caffeine. No Drinking alcohol. No Eating. No Smoking. No Exercising.   Five minutes before checking your blood pressure: Pee. Sit in a dining chair. Avoid sitting in a soft couch or armchair. Be quiet. Do not talk

## 2023-07-12 NOTE — Telephone Encounter (Signed)
Pharmacy Patient Advocate Encounter  Received notification from [facility] that Prior Authorization for repatha has been APPROVED from 07/12/23 to 01/09/24. Ran test claim, Copay is $47.00. This test claim was processed through [facility]- copay amounts may vary at other pharmacies due to pharmacy/plan contracts, or as the patient moves through the different stages of their insurance plan.   PA #/Case ID/Reference #: [id number]

## 2023-07-12 NOTE — Telephone Encounter (Signed)
-----   Message from Olene Floss sent at 07/12/2023 10:09 AM EDT ----- Please submit PA for Repatha. Dx STEMI I25.10 On rosuvastatin 40mg  and zetia 10mg  with LDL-C of 72

## 2023-07-12 NOTE — Telephone Encounter (Signed)
Pharmacy Patient Advocate Encounter   Received notification from Pt Calls Messages that prior authorization for repatha is required/requested.   Insurance verification completed.   The patient is insured through [facility] .   Per test claim: PA required; PA submitted to [facility] via CoverMyMeds Key/confirmation #/EOC [id number] Status is pending

## 2023-07-13 ENCOUNTER — Other Ambulatory Visit: Payer: Medicare Other

## 2023-07-13 ENCOUNTER — Encounter: Payer: Self-pay | Admitting: Pharmacist

## 2023-07-13 LAB — BASIC METABOLIC PANEL
BUN/Creatinine Ratio: 16 (ref 10–24)
BUN: 15 mg/dL (ref 8–27)
CO2: 25 mmol/L (ref 20–29)
Calcium: 9.5 mg/dL (ref 8.6–10.2)
Chloride: 98 mmol/L (ref 96–106)
Creatinine, Ser: 0.93 mg/dL (ref 0.76–1.27)
Glucose: 112 mg/dL — ABNORMAL HIGH (ref 70–99)
Potassium: 4.2 mmol/L (ref 3.5–5.2)
Sodium: 139 mmol/L (ref 134–144)
eGFR: 87 mL/min/{1.73_m2} (ref >59)

## 2023-07-13 LAB — HEPATIC FUNCTION PANEL
ALT: 33 IU/L (ref 0–44)
AST: 52 IU/L — ABNORMAL HIGH (ref 0–40)
Albumin: 4.4 g/dL (ref 3.8–4.8)
Alkaline Phosphatase: 116 IU/L (ref 44–121)
Bilirubin Total: 0.4 mg/dL (ref 0.0–1.2)
Bilirubin, Direct: 0.17 mg/dL (ref 0.00–0.40)
Total Protein: 6.3 g/dL (ref 6.0–8.5)

## 2023-07-13 MED ORDER — REPATHA SURECLICK 140 MG/ML ~~LOC~~ SOAJ: 1.0000 mL | SUBCUTANEOUS | 11 refills | Status: DC

## 2023-07-13 NOTE — Addendum Note (Signed)
Addended by: Malena Peer D on: 07/13/2023 09:39 AM   Modules accepted: Orders

## 2023-07-13 NOTE — Telephone Encounter (Signed)
Spoke with patient who is ok with the cost. Rx sent to [facility] per pt request. He will get labs when he is here to see [facility] 12/13.

## 2023-07-20 ENCOUNTER — Other Ambulatory Visit: Payer: Medicare Other

## 2023-07-29 ENCOUNTER — Encounter: Payer: Self-pay | Admitting: Pharmacist

## 2023-08-10 ENCOUNTER — Ambulatory Visit: Payer: Medicare Other | Admitting: Podiatry

## 2023-08-10 ENCOUNTER — Encounter: Payer: Self-pay | Admitting: Podiatry

## 2023-08-10 VITALS — Ht 66.0 in | Wt 147.4 lb

## 2023-08-10 DIAGNOSIS — B351 Tinea unguium: Secondary | ICD-10-CM | POA: Diagnosis not present

## 2023-08-10 DIAGNOSIS — M79676 Pain in unspecified toe(s): Secondary | ICD-10-CM

## 2023-08-10 NOTE — Progress Notes (Signed)
This patient returns to the office for evaluation and treatment of long thick painful nails .  This patient is unable to trim his own nails since the patient cannot reach the feet.  Patient says the nails are painful walking and wearing his shoes.  He returns for preventive foot care services.  General Appearance  Alert, conversant and in no acute stress.  Vascular  Dorsalis pedis and posterior tibial  pulses are palpable  bilaterally.  Capillary return is within normal limits  bilaterally. Temperature is within normal limits  bilaterally.  Neurologic  Senn-Weinstein monofilament wire test within normal limits  bilaterally. Muscle power within normal limits bilaterally.  Nails Thick disfigured discolored nails with subungual debris  from hallux to fifth toes bilaterally. No evidence of bacterial infection or drainage bilaterally.  Orthopedic  No limitations of motion  feet .  No crepitus or effusions noted.  No bony pathology or digital deformities noted.  Skin  normotropic skin with no porokeratosis noted bilaterally.  No signs of infections or ulcers noted.     Onychomycosis  Pain in toes right foot  Pain in toes left foot  Debridement  of nails  1-5  B/L with a nail nipper.  Nails were then filed using a dremel tool with no incidents.    RTC 3 months    Argie Lober DPM  

## 2023-08-16 ENCOUNTER — Other Ambulatory Visit: Payer: Self-pay

## 2023-08-16 ENCOUNTER — Encounter (HOSPITAL_COMMUNITY): Payer: Self-pay | Admitting: Psychiatry

## 2023-08-16 ENCOUNTER — Ambulatory Visit (HOSPITAL_BASED_OUTPATIENT_CLINIC_OR_DEPARTMENT_OTHER): Payer: Medicare Other | Admitting: Psychiatry

## 2023-08-16 VITALS — BP 112/73 | HR 64 | Ht 66.0 in | Wt 143.0 lb

## 2023-08-16 DIAGNOSIS — F325 Major depressive disorder, single episode, in full remission: Secondary | ICD-10-CM | POA: Diagnosis not present

## 2023-08-16 MED ORDER — HYDROXYZINE PAMOATE 25 MG PO CAPS: 25.0000 mg | ORAL_CAPSULE | Freq: Three times a day (TID) | ORAL | 0 refills | Status: DC | PRN

## 2023-08-16 MED ORDER — FLUOXETINE HCL 20 MG PO CAPS: 20.0000 mg | ORAL_CAPSULE | Freq: Every day | ORAL | 2 refills | Status: DC
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
## 2023-08-21 ENCOUNTER — Encounter (HOSPITAL_COMMUNITY): Payer: Self-pay

## 2023-08-23 ENCOUNTER — Encounter: Payer: Self-pay | Admitting: Adult Health

## 2023-08-23 ENCOUNTER — Ambulatory Visit (INDEPENDENT_AMBULATORY_CARE_PROVIDER_SITE_OTHER): Payer: Medicare Other | Admitting: Adult Health

## 2023-08-23 VITALS — BP 120/60 | HR 62 | Temp 98.1°F | Ht 66.0 in | Wt 144.0 lb

## 2023-08-23 DIAGNOSIS — K429 Umbilical hernia without obstruction or gangrene: Secondary | ICD-10-CM | POA: Diagnosis not present

## 2023-08-23 DIAGNOSIS — N529 Male erectile dysfunction, unspecified: Secondary | ICD-10-CM

## 2023-08-23 NOTE — Patient Instructions (Signed)
It was great seeing you today   I would try using a probiotic called Seed.

## 2023-08-23 NOTE — Progress Notes (Signed)
Subjective:    Patient ID: Jeffrey Hudson, male    DOB: 10/19/1950, 72 y.o.   MRN: [id number]  HPI 72 year old male who  has a past medical history of AAA (abdominal aortic aneurysm) (HCC), AMI (acute myocardial infarction) (HCC), Anxiety, Arthritis, Bipolar disorder (HCC), COPD (chronic obstructive pulmonary disease) (HCC), Coronary artery disease, Depression, Emphysema of lung (HCC), GERD (gastroesophageal reflux disease), Glaucoma, History of kidney stones, adenomatous polyp of colon (05/06/2021), Hyperlipidemia, Hypertension, Nodule of left lung, Plantar fasciitis, Pre-diabetes, Syncope and collapse, Tobacco abuse, and Viral URI with cough (11/22/2016).  He presents to the office today for multiple issues.   He has an umbilical hernia that he has had for quite some time. He feels as though the hernia has grown in size. He denies pain and it is easily reduced but he does suffer from occasional constipation  He also has a history of erectile dysfunction in which he is not able to get an erection. He has tried generic viagra which did not help.  I have also tried him on Cialis and he reports despite taking 20 mg he did not get any benefit from this.    Review of Systems See HPI   Past Medical History:  Diagnosis Date   AAA (abdominal aortic aneurysm) (HCC)    AMI (acute myocardial infarction) (HCC)    Acute ST elevation   Anxiety    Arthritis    neck   Bipolar disorder (HCC)    COPD (chronic obstructive pulmonary disease) (HCC)    Coronary artery disease    a. s/p anterolateral STEMI with BMS placed in large diagonal branch (2009).   Depression    Emphysema of lung (HCC)    GERD (gastroesophageal reflux disease)    Glaucoma    History of kidney stones    Hx of adenomatous polyp of colon 05/06/2021   diminutive - no f/u needed   Hyperlipidemia    Hypertension    Nodule of left lung    6-mm smooothly rounded nodule   Plantar fasciitis    left foot   Pre-diabetes     Syncope and collapse    in 2001 and 2004 with no clear cause   Tobacco abuse    Viral URI with cough 11/22/2016    Social History   Socioeconomic History   Marital status: Married    Spouse name: Not on file   Number of children: 1   Years of education: Not on file   Highest education level: Bachelor's degree (e.g., BA, AB, BS)  Occupational History   Occupation: Retired-Sales/broker  Tobacco Use   Smoking status: Every Day    Current packs/day: 0.25    Average packs/day: 1 pack/day for 54.8 years (54.2 ttl pk-yrs)    Types: Cigarettes    Start date: 1970   Smokeless tobacco: Never   Tobacco comments:    Patient states he's down to smoking 3 cigarettes a day as of 01/05/23 ep  Vaping Use   Vaping status: Never Used  Substance and Sexual Activity   Alcohol use: Yes    Alcohol/week: 14.0 standard drinks of alcohol    Types: 14 Shots of liquor per week   Drug use: Not Currently   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[employer]   He has been married for 35 years  One child (son)       He likes to [employer] - plays once a week    2 glasses of wine a day max, still smoking 2 cups of coffee a day no drug use or other tobacco   Social Determinants of Health   Financial Resource Strain: Low Risk  (05/27/2023)   Overall Financial Resource Strain (CARDIA)    Difficulty of Paying Living Expenses: Not hard at all  Food Insecurity: No Food Insecurity (05/27/2023)   Hunger Vital Sign    Worried About Running Out of Food in the Last Year: Never true    Ran Out of Food in the Last Year: Never true  Transportation Needs: No Transportation Needs (05/27/2023)   PRAPARE - Administrator, Civil Service (Medical): No    Lack of Transportation (Non-Medical): No  Physical Activity: Insufficiently Active (05/27/2023)   Exercise Vital Sign    Days of Exercise  per Week: 3 days    Minutes of Exercise per Session: 20 min  Stress: No Stress Concern Present (05/27/2023)   [employer] of Occupational Health - Occupational Stress Questionnaire    Feeling of Stress : Not at all  Social Connections: Moderately Integrated (05/27/2023)   Social Connection and Isolation Panel [NHANES]    Frequency of Communication with Friends and Family: More than three times a week    Frequency of Social Gatherings with Friends and Family: Three times a week    Attends Religious Services: Never    Active Member of Clubs or Organizations: Yes    Attends Banker Meetings: Never    Marital Status: Married  Catering manager Violence: Not At Risk (05/27/2023)   Humiliation, Afraid, Rape, and Kick questionnaire    Fear of Current or Ex-Partner: No    Emotionally Abused: No    Physically Abused: No    Sexually Abused: No    Past Surgical History:  Procedure Laterality Date   ABDOMINAL AORTIC ENDOVASCULAR STENT GRAFT Bilateral 04/25/2023   Procedure: ABDOMINAL AORTIC ENDOVASCULAR STENT GRAFT WITH RIGHT ILIAC BRANCH ENDOPROSTHESIS;  Surgeon: Cephus Shelling, MD;  Location: [facility] OR;  Service: Vascular;  Laterality: Bilateral;   ABDOMINAL SURGERY  04/25/2023   COLONOSCOPY     PERCUTANEOUS CORONARY STENT INTERVENTION (PCI-S)     TONSILLECTOMY     removed as a child, also removed uvula   ULTRASOUND GUIDANCE FOR VASCULAR ACCESS Bilateral 04/25/2023   Procedure: ULTRASOUND GUIDANCE FOR VASCULAR ACCESS;  Surgeon: Cephus Shelling, MD;  Location: [facility] OR;  Service: Vascular;  Laterality: Bilateral;   UPPER GASTROINTESTINAL ENDOSCOPY      Family History  Problem Relation Age of Onset   Dementia Mother 67       died   Alcohol abuse Father 6   Depression Brother    Healthy Son    Colon cancer Neg Hx    Esophageal cancer Neg Hx    Rectal cancer Neg Hx    Stomach cancer Neg Hx     Allergies  Allergen Reactions   Lisinopril Swelling    Angio edema     Metformin And Related Diarrhea   Norvasc [Amlodipine] Swelling    Lower extremity     Current Outpatient Medications on File Prior to Visit  Medication Sig Dispense Refill   albuterol (PROAIR HFA) 108 (90 Base) MCG/ACT inhaler Inhale 2 puffs into the lungs every 6 (six) hours as needed. 1 Inhaler 3   aspirin 81 MG tablet Take 81 mg by mouth  daily.     betamethasone dipropionate 0.05 % cream Apply 1 application  topically as needed (for eczema).     carvedilol (COREG) 3.125 MG tablet Take 1 tablet (3.125 mg total) by mouth 2 (two) times daily with a meal. 180 tablet 3   chlorthalidone (HYGROTON) 25 MG tablet Take 1 tablet (25 mg total) by mouth daily. 90 tablet 3   clobetasol ointment (TEMOVATE) 0.05 % Apply 1 application  topically 2 (two) times daily as needed (rash).     diclofenac Sodium (VOLTAREN) 1 % GEL Apply 1 Application topically 4 (four) times daily as needed (pain).     doxazosin (CARDURA) 2 MG tablet Take 1 tablet (2 mg total) by mouth daily. 90 tablet 3   Evolocumab (REPATHA SURECLICK) 140 MG/ML SOAJ Inject 140 mg into the skin every 14 (fourteen) days. 2 mL 11   ezetimibe (ZETIA) 10 MG tablet TAKE 1 TABLET BY MOUTH DAILY 90 tablet 3   fluocinonide (LIDEX) 0.05 % external solution Apply 1 Application topically 2 (two) times daily as needed (scalp irritation).     FLUoxetine (PROZAC) 20 MG capsule Take 1 capsule (20 mg total) by mouth daily. 90 capsule 2   hydrOXYzine (VISTARIL) 25 MG capsule Take 1 capsule (25 mg total) by mouth 3 (three) times daily as needed. 30 capsule 0   latanoprost (XALATAN) 0.005 % ophthalmic solution Place 1 drop into both eyes at bedtime.     meloxicam (MOBIC) 7.5 MG tablet TAKE 1 TABLET BY MOUTH  DAILY 90 tablet 3   Multiple Vitamin (MULTIVITAMIN) tablet Take 1 tablet by mouth daily.     nitroGLYCERIN (NITROSTAT) 0.4 MG SL tablet Place 1 tablet (0.4 mg total) under the tongue every 5 (five) minutes as needed for chest pain. 25 tablet 11   oxymetazoline  (AFRIN) 0.05 % nasal spray Place 1 spray into both nostrils 2 (two) times daily as needed for congestion.     pantoprazole (PROTONIX) 40 MG tablet TAKE 1 TABLET BY MOUTH DAILY  BEFORE BREAKFAST 90 tablet 3   rosuvastatin (CRESTOR) 40 MG tablet TAKE 1 TABLET BY MOUTH DAILY 90 tablet 3   timolol (BETIMOL) 0.5 % ophthalmic solution Place 1 drop into both eyes daily.     No current facility-administered medications on file prior to visit.    BP 120/60   Pulse 62   Temp 98.1 F (36.7 C) (Oral)   Ht 5\' 6"  (1.676 m)   Wt 144 lb (65.3 kg)   SpO2 97%   BMI 23.24 kg/m       Objective:   Physical Exam Vitals and nursing note reviewed.  Constitutional:      Appearance: Normal appearance.  Cardiovascular:     Rate and Rhythm: Normal rate and regular rhythm.     Pulses: Normal pulses.     Heart sounds: Normal heart sounds.  Pulmonary:     Effort: Pulmonary effort is normal.     Breath sounds: Normal breath sounds.  Abdominal:     General: Abdomen is flat. Bowel sounds are normal.     Palpations: Abdomen is soft.     Hernia: A hernia is present. Hernia is present in the umbilical area (easily reduced without pain.).  Musculoskeletal:        General: Normal range of motion.  Skin:    General: Skin is warm and dry.     Capillary Refill: Capillary refill takes less than 2 seconds.  Neurological:     General: No focal deficit  present.     Mental Status: He is alert and oriented to person, place, and time.  Psychiatric:        Mood and Affect: Mood normal.        Behavior: Behavior normal.        Thought Content: Thought content normal.        Judgment: Judgment normal.       Assessment & Plan:  1. Umbilical hernia without obstruction and without gangrene  - Ambulatory referral to General Surgery - Can use Probiotic to help with constipation.  - Stay hydrated and eat a high fiber diet  - Follow up if hernia becomes hard or painful.   2. Erectile dysfunction, unspecified  erectile dysfunction type - Does not want to see Urology at this time.  - There is not much else I can do for him on this issue   Shirline Frees, NP  Time spent with patient today was 31 minutes which consisted of chart review, discussing Umbilical hernias and ED, work up, treatment answering questions and documentation.

## 2023-08-24 ENCOUNTER — Encounter: Payer: Self-pay | Admitting: Adult Health

## 2023-08-26 DIAGNOSIS — K429 Umbilical hernia without obstruction or gangrene: Secondary | ICD-10-CM | POA: Diagnosis not present

## 2023-08-29 ENCOUNTER — Other Ambulatory Visit: Payer: Self-pay | Admitting: Acute Care

## 2023-08-29 ENCOUNTER — Telehealth: Payer: Self-pay | Admitting: Pulmonary Disease

## 2023-08-29 DIAGNOSIS — Z87891 Personal history of nicotine dependence: Secondary | ICD-10-CM

## 2023-08-29 DIAGNOSIS — F17210 Nicotine dependence, cigarettes, uncomplicated: Secondary | ICD-10-CM

## 2023-08-29 DIAGNOSIS — Z122 Encounter for screening for malignant neoplasm of respiratory organs: Secondary | ICD-10-CM

## 2023-08-29 NOTE — Telephone Encounter (Signed)
Patient would like reschedule CT scan 09/30/2023. Patient phone number is [phone].

## 2023-08-29 NOTE — Telephone Encounter (Signed)
Patient resc and appt info given  to the patient

## 2023-09-08 ENCOUNTER — Telehealth (HOSPITAL_COMMUNITY): Payer: Self-pay

## 2023-09-08 NOTE — Telephone Encounter (Signed)
Medication refill - Patient called requesting a new Hydroxyzine 25 mg refill, last provided 08/16/23 for #30. Informed Dr. Donell Beers would not be back in the ofifce until 09/13/23 but would send request to Dr. Donell Beers. Requested pt call back that date if had not heard back by then and patient agreed with plan.

## 2023-09-20 ENCOUNTER — Encounter (HOSPITAL_COMMUNITY): Payer: Self-pay | Admitting: Psychiatry

## 2023-09-20 ENCOUNTER — Ambulatory Visit (HOSPITAL_COMMUNITY): Payer: Medicare Other | Admitting: Psychiatry

## 2023-09-20 ENCOUNTER — Other Ambulatory Visit: Payer: Self-pay

## 2023-09-20 VITALS — BP 150/79 | HR 70 | Ht 66.0 in | Wt 147.8 lb

## 2023-09-20 DIAGNOSIS — F324 Major depressive disorder, single episode, in partial remission: Secondary | ICD-10-CM | POA: Diagnosis not present

## 2023-09-20 MED ORDER — FLUOXETINE HCL 20 MG PO CAPS: 20.0000 mg | ORAL_CAPSULE | Freq: Every day | ORAL | 2 refills | Status: DC

## 2023-09-20 MED ORDER — HYDROXYZINE PAMOATE 25 MG PO CAPS: 25.0000 mg | ORAL_CAPSULE | Freq: Three times a day (TID) | ORAL | 4 refills | Status: DC | PRN
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
## 2023-09-27 DIAGNOSIS — H401131 Primary open-angle glaucoma, bilateral, mild stage: Secondary | ICD-10-CM | POA: Diagnosis not present

## 2023-09-29 NOTE — Progress Notes (Signed)
Cardiology Office Note:    Date:  09/30/2023  ID:  [name], DOB [birth year], MRN [id number] PCP: Shirline Frees, NP  [facility] Providers Cardiologist:  Verne Carrow, MD       Patient Profile:      Coronary artery disease  Ant-Lat STEMI in 2009 s/p BMS to Lg Dx branch Cath 4/11: Dx stent patent w 40-50 ISR, LAD several 20, LCx 50, dRCA 30, PLA 60 Myoview 12/19/14 - no ischemia COPD +Cigs Hypertension  Hyperlipidemia  Pre-Diabetes mellitus  Bipolar D/o  Abdominal aortic aneurysm and R CIA aneurysm  s/p EVAR in 04/2023 (Dr. Chestine Spore) Type II endoleak - followed w [place]          History of Present Illness:  Discussed the use of AI scribe software for clinical note transcription with the patient, who gave verbal consent to proceed.  Jeffrey Hudson is a 72 y.o. male who returns for follow up of HTN, CAD. At last visit in 06/2023, his BP was uncontrolled. I changed hydrochlorothiazide to chlorthalidone and Metoprolol succinate to carvedilol. He is here alone. He has been monitoring his blood pressure at home. Readings have ranged from 125 to 145, with no reported symptoms of hypotension. He admits to occasional difficulty remembering to take the evening dose of carvedilol due to his routine. He has made progress in reducing his smoking habit, now down to less than five cigarettes per day, primarily in the evening. He also mentions moderate alcohol consumption in the evenings. He has noticed improvements in his sense of smell and taste since reducing his smoking. He has not had chest pain, shortness of breath, syncope, edema.      ROS   See HPI     Studies Reviewed:       Results   LABS K: 4.2 (06/20/2023) Cr: 0.93 (06/20/2023) ALT: 33 (06/20/2023) HDL: 75 (03/15/2023) LDL: 72 (03/15/2023) Triglycerides: 154 (03/15/2023) Hb: 9.3 (04/26/2023)      Risk Assessment/Calculations:     HYPERTENSION CONTROL Vitals:   09/30/23 0826 09/30/23 0858  BP: (!)  143/70 (!) 160/80    The patient's blood pressure is elevated above target today.  In order to address the patient's elevated BP: Blood pressure will be monitored at home to determine if medication changes need to be made.          Physical Exam:   VS:  BP (!) 160/80   Pulse 62   Ht 5\' 6"  (1.676 m)   Wt 146 lb (66.2 kg)   SpO2 96%   BMI 23.57 kg/m    Wt Readings from Last 3 Encounters:  09/30/23 146 lb (66.2 kg)  08/23/23 144 lb (65.3 kg)  08/10/23 147 lb 6.4 oz (66.9 kg)    Constitutional:      Appearance: Healthy appearance. Not in distress.  Neck:     Vascular: JVD normal.  Pulmonary:     Breath sounds: Normal breath sounds. No wheezing. No rales.  Cardiovascular:     Normal rate. Regular rhythm.     Murmurs: There is no murmur.  Edema:    Peripheral edema absent.  Abdominal:     Palpations: Abdomen is soft.        Assessment and Plan:   Assessment & Plan Essential hypertension Blood pressure elevated here.  However, patient notes more optimal blood pressures at home since adjusting his medications.  Will continue to monitor for now and adjust medications if needed. -Continue carvedilol 3.125 mg twice daily,  chlorthalidone 25 mg daily, doxazosin 2 mg daily. -Send blood pressure readings in 1 week -If blood pressure above goal (goal less than 130/80), consider low-dose amlodipine 2.5 mg daily (he had edema with higher dose) versus spironolactone versus increasing doxazosin Coronary artery disease involving native coronary artery of native heart without angina pectoris History of anterolateral STEMI in 2009 treated with a bare-metal stent to the diagonal.  He had a patent stent in the diagonal by catheterization in 2011 and mild to moderate nonobstructive disease elsewhere.  Nuclear stress test in 2016 was negative for ischemia.  He is doing well without chest pain to suggest angina. -Continue aspirin 81 mg daily, nitroglycerin as needed, rosuvastatin 40 mg daily,  Zetia 10 mg daily, Repatha 140 mg every 2 weeks -Follow-up 6 months Pure hypercholesterolemia He is now on Repatha.  He had lipids checked earlier today.  Continue Repatha 140 mg every 2 weeks, Zetia 10 mg daily, rosuvastatin 40 mg daily. Tobacco use He has reduced significantly and is trying to quit.       Dispo:  Return in about 6 months (around 03/30/2024) for Routine Follow Up, w/ Dr. Clifton James, or [name], PA-C.  Signed, [name], PA-C

## 2023-09-30 ENCOUNTER — Ambulatory Visit: Payer: Medicare Other

## 2023-09-30 ENCOUNTER — Encounter: Payer: Self-pay | Admitting: Physician Assistant

## 2023-09-30 ENCOUNTER — Ambulatory Visit (HOSPITAL_BASED_OUTPATIENT_CLINIC_OR_DEPARTMENT_OTHER): Payer: Medicare Other

## 2023-09-30 ENCOUNTER — Ambulatory Visit: Payer: Medicare Other | Attending: Physician Assistant | Admitting: Physician Assistant

## 2023-09-30 VITALS — BP 160/80 | HR 62 | Ht 66.0 in | Wt 146.0 lb

## 2023-09-30 DIAGNOSIS — Z720 Tobacco use: Secondary | ICD-10-CM

## 2023-09-30 DIAGNOSIS — E7800 Pure hypercholesterolemia, unspecified: Secondary | ICD-10-CM

## 2023-09-30 DIAGNOSIS — I2510 Atherosclerotic heart disease of native coronary artery without angina pectoris: Secondary | ICD-10-CM | POA: Diagnosis not present

## 2023-09-30 DIAGNOSIS — I10 Essential (primary) hypertension: Secondary | ICD-10-CM

## 2023-09-30 LAB — LIPID PANEL
Chol/HDL Ratio: 1.2 {ratio} (ref 0.0–5.0)
Cholesterol, Total: 102 mg/dL (ref 100–199)
HDL: 84 mg/dL (ref >39)
LDL Chol Calc (NIH): 7 mg/dL (ref 0–99)
Triglycerides: 41 mg/dL (ref 0–149)
VLDL Cholesterol Cal: 11 mg/dL (ref 5–40)

## 2023-09-30 NOTE — Assessment & Plan Note (Signed)
He has reduced significantly and is trying to quit.

## 2023-09-30 NOTE — Assessment & Plan Note (Signed)
He is now on Repatha.  He had lipids checked earlier today.  Continue Repatha 140 mg every 2 weeks, Zetia 10 mg daily, rosuvastatin 40 mg daily.

## 2023-09-30 NOTE — Patient Instructions (Signed)
Medication Instructions:  Your physician recommends that you continue on your current medications as directed. Please refer to the Current Medication list given to you today.  *If you need a refill on your cardiac medications before your next appointment, please call your pharmacy*   Lab Work: None ordered  If you have labs (blood work) drawn today and your tests are completely normal, you will receive your results only by: MyChart Message (if you have MyChart) OR A paper copy in the mail If you have any lab test that is abnormal or we need to change your treatment, we will call you to review the results.   Testing/Procedures: None ordered   Follow-Up: At [facility], you and your health needs are our priority.  As part of our continuing mission to provide you with exceptional heart care, we have created designated Provider Care Teams.  These Care Teams include your primary Cardiologist (physician) and Advanced Practice Providers (APPs -  Physician Assistants and Nurse Practitioners) who all work together to provide you with the care you need, when you need it.  We recommend signing up for the patient portal called "MyChart".  Sign up information is provided on this After Visit Summary.  MyChart is used to connect with patients for Virtual Visits (Telemedicine).  Patients are able to view lab/test results, encounter notes, upcoming appointments, etc.  Non-urgent messages can be sent to your provider as well.   To learn more about what you can do with MyChart, go to [email or web address].    Your next appointment:   6 month(s)  Provider:   Verne Carrow, MD  or [name], PA-C         Other Instructions Your physician has requested that you regularly monitor and record your blood pressure readings at home. Please use the same machine at the same time of day to check your readings and record them to bring to your follow-up visit.   Please monitor blood pressures  and keep a log of your readings for 1 week then send them via mychart.    Make sure to check 2 hours after your medications.    AVOID these things for 30 minutes before checking your blood pressure: No Drinking caffeine. No Drinking alcohol. No Eating. No Smoking. No Exercising.   Five minutes before checking your blood pressure: Pee. Sit in a dining chair. Avoid sitting in a soft couch or armchair. Be quiet. Do not talk

## 2023-09-30 NOTE — Assessment & Plan Note (Signed)
Blood pressure elevated here.  However, patient notes more optimal blood pressures at home since adjusting his medications.  Will continue to monitor for now and adjust medications if needed. -Continue carvedilol 3.125 mg twice daily, chlorthalidone 25 mg daily, doxazosin 2 mg daily. -Send blood pressure readings in 1 week -If blood pressure above goal (goal less than 130/80), consider low-dose amlodipine 2.5 mg daily (he had edema with higher dose) versus spironolactone versus increasing doxazosin

## 2023-09-30 NOTE — Assessment & Plan Note (Addendum)
History of anterolateral STEMI in 2009 treated with a bare-metal stent to the diagonal.  He had a patent stent in the diagonal by catheterization in 2011 and mild to moderate nonobstructive disease elsewhere.  Nuclear stress test in 2016 was negative for ischemia.  He is doing well without chest pain to suggest angina. -Continue aspirin 81 mg daily, nitroglycerin as needed, rosuvastatin 40 mg daily, Zetia 10 mg daily, Repatha 140 mg every 2 weeks -Follow-up 6 months

## 2023-10-03 ENCOUNTER — Ambulatory Visit (HOSPITAL_BASED_OUTPATIENT_CLINIC_OR_DEPARTMENT_OTHER)
Admission: RE | Admit: 2023-10-03 | Discharge: 2023-10-03 | Disposition: A | Discharge status: Home / Self Care | Payer: Medicare Other | Source: Ambulatory Visit | Attending: Adult Health | Admitting: Adult Health

## 2023-10-03 DIAGNOSIS — Z122 Encounter for screening for malignant neoplasm of respiratory organs: Secondary | ICD-10-CM | POA: Insufficient documentation

## 2023-10-03 DIAGNOSIS — F17210 Nicotine dependence, cigarettes, uncomplicated: Secondary | ICD-10-CM | POA: Diagnosis not present

## 2023-10-03 DIAGNOSIS — Z87891 Personal history of nicotine dependence: Secondary | ICD-10-CM | POA: Insufficient documentation

## 2023-10-03 NOTE — Addendum Note (Signed)
Addended by: Malena Peer D on: 10/03/2023 08:43 AM   Modules accepted: Orders

## 2023-10-14 ENCOUNTER — Other Ambulatory Visit: Payer: Self-pay

## 2023-10-14 DIAGNOSIS — I469 Cardiac arrest, cause unspecified: Secondary | ICD-10-CM

## 2023-10-14 DIAGNOSIS — Z87891 Personal history of nicotine dependence: Secondary | ICD-10-CM

## 2023-10-14 DIAGNOSIS — Z122 Encounter for screening for malignant neoplasm of respiratory organs: Secondary | ICD-10-CM

## 2023-10-14 DIAGNOSIS — F17210 Nicotine dependence, cigarettes, uncomplicated: Secondary | ICD-10-CM

## 2023-10-21 ENCOUNTER — Telehealth: Payer: Self-pay | Admitting: Cardiovascular Disease

## 2023-10-21 MED ORDER — REPATHA SURECLICK 140 MG/ML ~~LOC~~ SOAJ: 1.0000 mL | SUBCUTANEOUS | 11 refills | Status: DC

## 2023-10-21 NOTE — Telephone Encounter (Signed)
 Patient contacted patient accounts re Repatha  Rx. I advised that we do not handle that in patient accounts but that I would forward the message.  Jeffrey Hudson wants [facility]  [facility] than [facility].  Please contact him for specifics.  His contact # is [phone].

## 2023-10-21 NOTE — Telephone Encounter (Signed)
*  STAT* If patient is at the pharmacy, call can be transferred to refill team.   1. Which medications need to be refilled? (please list name of each medication and dose if known) Evolocumab  (REPATHA  SURECLICK) 140 MG/ML SOAJ    2. Would you like to learn more about the convenience, safety, & potential cost savings by using the [facility]?     3. Are you open to using the [facility] (Type [facility]. ).   4. Which pharmacy/location (including street and city if local pharmacy) is medication to be sent to? [id number] [address] [place], [place] - [address]    5. Do they need a 30 day or 90 day supply? 90

## 2023-10-21 NOTE — Telephone Encounter (Signed)
 Pt states that [facility] is too expensive and is asking [facility]

## 2023-10-28 ENCOUNTER — Ambulatory Visit: Payer: Self-pay | Admitting: Adult Health

## 2023-10-28 NOTE — Telephone Encounter (Signed)
 Called pt multiple times but no answer.

## 2023-10-28 NOTE — Telephone Encounter (Signed)
  Chief Complaint: Animal Bite Symptoms: Break in skin Frequency: Yesterday Afternoon Pertinent Negatives: Patient denies fever, redness, drainage Disposition: [] ED /[] Urgent Care (no appt availability in office) / [] Appointment(In office/virtual)/ []  [facility]/ [] Home Care/ [] Refused Recommended Disposition /[]  Mobile Bus/ []  Follow-up with PCP Additional Notes: Pt reports he was helping a friend catch stray cats in traps yesterday afternoon, intially he kept it in his garage, it was friendly and was eating and letting pt pet it. When pt attempted to move the cat from the trap cage to another for transport the cat bit his left palm under the pinky finger. Pt reports the cat broke his skin but denies any drainage, redness, warmth. Pt states he cleaned it well, covered it in Neosporin and has been keeping it covered with a bandage but watching it closely. Due to the unknown immunization and history of the cat this RN advised pt to be seen in ED now to be evaluated. Pt verbalized understanding and agrees to plan.   Copied from CRM [id number]. Topic: Clinical - Red Word Triage >> Oct 28, 2023 10:13 AM Isabell A wrote: [facility] that prompted transfer to Nurse Triage: Stray cat bite, taking cat out of the cage at the shelter and patient got bit yesterday. Cat is currently in quarantine. Reason for Disposition  [1] Any break in skin from BITE (e.g., cut, puncture or scratch) AND[2] PET animal (e.g., dog, cat, or ferret) at risk for RABIES (e.g., sick, stray, unprovoked bite, developing country)  Answer Assessment - Initial Assessment Questions 1. ANIMAL: What type of animal caused the bite? Is the injury from a bite or a claw? If the animal is a dog or a cat, ask: Was it a pet or a stray? Was it acting ill or behaving strangely?     Stray, cat was friendly was initially very mild until he reached in to move the cat 2. LOCATION: Where is the bite located?      Left palm  below pinky finger 3. SIZE: How big is the bite? What does it look like?      Small, no signs of infection 4. ONSET: When did the bite happen? (Minutes or hours ago)      Yesterday PM around 1400 5. CIRCUMSTANCES: Tell me how this happened.      Pt reports he was helping a friend catch a stray to take to a shelter, he reached in to the cage to move it to one cage to another and the cat bit him. 6. TETANUS: When was your last tetanus booster?     Unknown 7. RABIES VACCINE: For dog or cat bites, ask: Do you know if the pet is vaccinated against rabies?  (e.g., yes, no, overdue for rabies shot, unknown)     Unknown as animal was a stray, animal in quarantine for 10 days  Protocols used: Animal Bite-A-AH

## 2023-10-29 ENCOUNTER — Encounter: Payer: Self-pay | Admitting: Pharmacist

## 2023-10-31 ENCOUNTER — Encounter: Payer: Self-pay | Admitting: Pharmacist

## 2023-11-08 DIAGNOSIS — G5603 Carpal tunnel syndrome, bilateral upper limbs: Secondary | ICD-10-CM | POA: Diagnosis not present

## 2023-11-08 DIAGNOSIS — M720 Palmar fascial fibromatosis [Dupuytren]: Secondary | ICD-10-CM | POA: Diagnosis not present

## 2023-11-08 DIAGNOSIS — M79644 Pain in right finger(s): Secondary | ICD-10-CM | POA: Diagnosis not present

## 2023-11-08 DIAGNOSIS — M1811 Unilateral primary osteoarthritis of first carpometacarpal joint, right hand: Secondary | ICD-10-CM | POA: Diagnosis not present

## 2023-11-22 ENCOUNTER — Encounter (HOSPITAL_COMMUNITY): Payer: Self-pay | Admitting: Psychiatry

## 2023-11-22 ENCOUNTER — Ambulatory Visit (HOSPITAL_COMMUNITY): Payer: Medicare Other | Admitting: Psychiatry

## 2023-11-22 ENCOUNTER — Other Ambulatory Visit: Payer: Self-pay

## 2023-11-22 VITALS — BP 128/73 | HR 64 | Ht 66.0 in | Wt 154.0 lb

## 2023-11-22 DIAGNOSIS — F1010 Alcohol abuse, uncomplicated: Secondary | ICD-10-CM | POA: Diagnosis not present

## 2023-11-22 NOTE — Progress Notes (Signed)
 Psychiatric Initial Adult Assessment   Patient Identification: Jeffrey Hudson MRN:  [id number] Date of Evaluation:  11/22/2023 Referral Source: Dr. Darell Chief Complaint:   Visit Diagnosis: Bipolar disorder  History of Present Illness:     Today the patient is seen in the office with his wife.  His wife's name is Verneda.  Today our IOP director came in and we had a discussion about his alcohol.  Patient is drinking regularly.  He could never really reduced very much at all.  I suspect he has more like 4-5 drinks a day.  He is already had drinks this morning.  Once again the patient has never had a DUI.  He has never had any other consequences other than it is obvious that his wife is upset with him.  In our session the patient became tearful.  He agreed that he has to do something.  He pointed to his wife's the reason that he has to do something.  He will continue his antidepressant but will all acknowledge that none of his psychiatric medicines do very much in the setting of regular alcohol use.  The patient has agreed to be set up for an appointment in 48 hours with the IOP assessment.  He will return to see me in 6 weeks.   (Hypo) Manic Symptoms:   Anxiety Symptoms:   Psychotic Symptoms:   PTSD Symptoms:   Past Psychiatric History: Zyprexa  and Prozac , has been psychotherapy in the past including cognitive behavioral therapy.  Previous Psychotropic Medications: Zyprexa  and Prozac   Substance Abuse History in the last 12 months:    Consequences of Substance Abuse:   Past Medical History:  Past Medical History:  Diagnosis Date   AAA (abdominal aortic aneurysm) (HCC)    AMI (acute myocardial infarction) (HCC)    Acute ST elevation   Anxiety    Arthritis    neck   Bipolar disorder (HCC)    COPD (chronic obstructive pulmonary disease) (HCC)    Coronary artery disease    a. s/p anterolateral STEMI with BMS placed in large diagonal branch (2009).   Depression    Emphysema  of lung (HCC)    GERD (gastroesophageal reflux disease)    Glaucoma    History of kidney stones    Hx of adenomatous polyp of colon 05/06/2021   diminutive - no f/u needed   Hyperlipidemia    Hypertension    Nodule of left lung    6-mm smooothly rounded nodule   Plantar fasciitis    left foot   Pre-diabetes    Syncope and collapse    in 2001 and 2004 with no clear cause   Tobacco abuse    Viral URI with cough 11/22/2016    Past Surgical History:  Procedure Laterality Date   ABDOMINAL AORTIC ENDOVASCULAR STENT GRAFT Bilateral 04/25/2023   Procedure: ABDOMINAL AORTIC ENDOVASCULAR STENT GRAFT WITH RIGHT ILIAC BRANCH ENDOPROSTHESIS;  Surgeon: Gretta Lonni PARAS, MD;  Location: [facility] OR;  Service: Vascular;  Laterality: Bilateral;   ABDOMINAL SURGERY  04/25/2023   COLONOSCOPY     PERCUTANEOUS CORONARY STENT INTERVENTION (PCI-S)     TONSILLECTOMY     removed as a child, also removed uvula   ULTRASOUND GUIDANCE FOR VASCULAR ACCESS Bilateral 04/25/2023   Procedure: ULTRASOUND GUIDANCE FOR VASCULAR ACCESS;  Surgeon: Gretta Lonni PARAS, MD;  Location: [facility] OR;  Service: Vascular;  Laterality: Bilateral;   UPPER GASTROINTESTINAL ENDOSCOPY      Family Psychiatric History:   Family History:  Family History  Problem Relation Age of Onset   Dementia Mother 84       died   Alcohol abuse Father 64   Depression Brother    Healthy Son    Colon cancer Neg Hx    Esophageal cancer Neg Hx    Rectal cancer Neg Hx    Stomach cancer Neg Hx     Social History:   Social History   Socioeconomic History   Marital status: Married    Spouse name: Not on file   Number of children: 1   Years of education: Not on file   Highest education level: Bachelor's degree (e.g., BA, AB, BS)  Occupational History   Occupation: Retired-Sales/broker  Tobacco Use   Smoking status: Every Day    Current packs/day: 0.25    Average packs/day: 1 pack/day for 55.1 years (54.3 ttl pk-yrs)    Types: Cigarettes     Start date: 1970   Smokeless tobacco: Never   Tobacco comments:    Patient states he's down to smoking 3 cigarettes a day as of 01/05/23 ep  Vaping Use   Vaping status: Never Used  Substance and Sexual Activity   Alcohol use: Yes    Alcohol/week: 14.0 standard drinks of alcohol    Types: 14 Shots of liquor per week   Drug use: Not Currently   Sexual activity: Not on file  Other Topics Concern   Not on file  Social History Narrative   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 [employer]   He has been married for 35 years    One child (son)       He likes to [employer] - plays once a week    2 glasses of wine a day max, still smoking 2 cups of coffee a day no drug use or other tobacco   Social Drivers of Corporate Investment Banker Strain: Low Risk  (05/27/2023)   Overall Financial Resource Strain (CARDIA)    Difficulty of Paying Living Expenses: Not hard at all  Food Insecurity: No Food Insecurity (05/27/2023)   Hunger Vital Sign    Worried About Running Out of Food in the Last Year: Never true    Ran Out of Food in the Last Year: Never true  Transportation Needs: No Transportation Needs (05/27/2023)   PRAPARE - Administrator, Civil Service (Medical): No    Lack of Transportation (Non-Medical): No  Physical Activity: Insufficiently Active (05/27/2023)   Exercise Vital Sign    Days of Exercise per Week: 3 days    Minutes of Exercise per Session: 20 min  Stress: No Stress Concern Present (05/27/2023)   [employer] of Occupational Health - Occupational Stress Questionnaire    Feeling of Stress : Not at all  Social Connections: Moderately Integrated (05/27/2023)   Social Connection and Isolation Panel [NHANES]    Frequency of Communication with Friends and Family: More than three times a week    Frequency of Social Gatherings with Friends and Family: Three times a week    Attends Religious Services: Never    Active Member of Clubs or  Organizations: Yes    Attends Banker Meetings: Never    Marital Status: Married    Additional Social History:   Allergies:   Allergies  Allergen Reactions   Lisinopril  Swelling    Angio edema    Metformin  And Related Diarrhea   Norvasc  [Amlodipine ] Swelling    Lower extremity  Metabolic Disorder Labs: Lab Results  Component Value Date   HGBA1C 5.9 07/28/2021   MPG 131 06/24/2009   No results found for: PROLACTIN Lab Results  Component Value Date   CHOL 102 09/30/2023   TRIG 41 09/30/2023   HDL 84 09/30/2023   CHOLHDL 1.2 09/30/2023   VLDL 67.9 01/15/2021   LDLCALC 7 09/30/2023   LDLCALC 72 03/15/2023   Lab Results  Component Value Date   TSH 3.40 01/15/2021    Therapeutic Level Labs: No results found for: LITHIUM No results found for: CBMZ No results found for: VALPROATE  Current Medications: Current Outpatient Medications  Medication Sig Dispense Refill   albuterol  (PROAIR  HFA) 108 (90 Base) MCG/ACT inhaler Inhale 2 puffs into the lungs every 6 (six) hours as needed. 1 Inhaler 3   aspirin  81 MG tablet Take 81 mg by mouth daily.     betamethasone  dipropionate 0.05 % cream Apply 1 application  topically as needed (for eczema).     carvedilol  (COREG ) 3.125 MG tablet Take 1 tablet (3.125 mg total) by mouth 2 (two) times daily with a meal. 180 tablet 3   chlorthalidone  (HYGROTON ) 25 MG tablet Take 1 tablet (25 mg total) by mouth daily. 90 tablet 3   clobetasol  ointment (TEMOVATE ) 0.05 % Apply 1 application  topically 2 (two) times daily as needed (rash).     diclofenac Sodium (VOLTAREN) 1 % GEL Apply 1 Application topically 4 (four) times daily as needed (pain).     doxazosin  (CARDURA ) 2 MG tablet Take 1 tablet (2 mg total) by mouth daily. 90 tablet 3   Evolocumab  (REPATHA  SURECLICK) 140 MG/ML SOAJ Inject 140 mg into the skin every 14 (fourteen) days. 2 mL 11   fluocinonide  (LIDEX ) 0.05 % external solution Apply 1 Application topically 2  (two) times daily as needed (scalp irritation).     FLUoxetine  (PROZAC ) 20 MG capsule Take 1 capsule (20 mg total) by mouth daily. 90 capsule 2   hydrOXYzine  (VISTARIL ) 25 MG capsule Take 1 capsule (25 mg total) by mouth 3 (three) times daily as needed. 30 capsule 4   latanoprost  (XALATAN ) 0.005 % ophthalmic solution Place 1 drop into both eyes at bedtime.     meloxicam  (MOBIC ) 7.5 MG tablet TAKE 1 TABLET BY MOUTH  DAILY 90 tablet 3   Multiple Vitamin (MULTIVITAMIN) tablet Take 1 tablet by mouth daily.     nitroGLYCERIN  (NITROSTAT ) 0.4 MG SL tablet Place 1 tablet (0.4 mg total) under the tongue every 5 (five) minutes as needed for chest pain. 25 tablet 11   oxymetazoline  (AFRIN) 0.05 % nasal spray Place 1 spray into both nostrils 2 (two) times daily as needed for congestion.     pantoprazole  (PROTONIX ) 40 MG tablet TAKE 1 TABLET BY MOUTH DAILY  BEFORE BREAKFAST 90 tablet 3   rosuvastatin  (CRESTOR ) 40 MG tablet TAKE 1 TABLET BY MOUTH DAILY 90 tablet 3   timolol  (BETIMOL ) 0.5 % ophthalmic solution Place 1 drop into both eyes daily.     No current facility-administered medications for this visit.    Musculoskeletal: Strength & Muscle Tone: within normal limits Gait & Station: normal Patient leans: N/A  Psychiatric Specialty Exam: ROS  Blood pressure 128/73, pulse 64, height 5' 6 (1.676 m), weight 154 lb (69.9 kg).Body mass index is 24.86 kg/m.  General Appearance: Casual  Eye Contact:  Good  Speech:  Clear and Coherent  Volume:  Normal  Mood:  Negative  Affect:  Appropriate  Thought Process:  Goal  Directed  Orientation:  Full (Time, Place, and Person)  Thought Content:  WDL  Suicidal Thoughts:  No  Homicidal Thoughts:  No  Memory:  NA  Judgement:  Good  Insight:  NA and Good  Psychomotor Activity:  Normal  Concentration:    Recall:  Good  Fund of Knowledge:Good  Language: Good  Akathisia:  No  Handed:  Right  AIMS (if indicated):  not done  Assets:  Desire for Improvement   ADL's:  Intact  Cognition: WNL  Sleep:  Good   Screenings: AUDIT    Flowsheet Row Clinical Support from 05/27/2023 in [facility] [place] Office Visit from 12/04/2021 in [facility] [place]  Alcohol Use Disorder Identification Test Final Score (AUDIT) 4 4      PHQ2-9    Flowsheet Row Office Visit from 08/23/2023 in [facility] [place] Clinical Support from 05/27/2023 in [facility] [place] Office Visit from 03/08/2023 in [facility] [place] Clinical Support from 07/06/2022 in [facility] [place] Clinical Support from 06/02/2021 in [facility] [place]  PHQ-2 Total Score 2 0 0 0 0  PHQ-9 Total Score 2 -- 0 -- --      Flowsheet Row Pre-Admission Testing 58 from 04/20/2023 in [facility] PREADMISSION TESTING  C-SSRS RISK CATEGORY No Risk       Assessment and Plan:   2/4/20253:53 PM   This patient's major problem is substance use as alcohol is the major issue.  Today the patient agreed to be assessed in the IOP program.  She will be given another appointment to see me in 6 weeks.  The patient is willing and interested in having a discussion about alcohol treatment.  My initial recommendation was [facility] for detox but he refuses that.SABRA

## 2023-11-24 ENCOUNTER — Ambulatory Visit (HOSPITAL_COMMUNITY): Payer: Medicare Other | Admitting: Licensed Clinical Social Worker

## 2023-11-24 ENCOUNTER — Encounter (HOSPITAL_COMMUNITY): Payer: Self-pay

## 2023-11-29 ENCOUNTER — Ambulatory Visit (INDEPENDENT_AMBULATORY_CARE_PROVIDER_SITE_OTHER): Payer: Medicare Other | Admitting: Licensed Clinical Social Worker

## 2023-11-29 DIAGNOSIS — F1020 Alcohol dependence, uncomplicated: Secondary | ICD-10-CM | POA: Diagnosis not present

## 2023-11-29 DIAGNOSIS — F39 Unspecified mood [affective] disorder: Secondary | ICD-10-CM

## 2023-11-29 NOTE — Progress Notes (Deleted)
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

## 2023-11-29 NOTE — Progress Notes (Signed)
THERAPIST PROGRESS NOTE  Session Time: 9 a.m. to 10:05 a.m.  CCA Screening, Triage and Referral (STR)  Patient Reported Information How did you hear about [place]? Other (Comment)  Referral name: Dr. Donell Beers  Referral phone number: No data recorded  Whom do you see for routine medical problems? Primary Care  Practice/Facility Name: [facility]  Practice/Facility Phone Number: No data recorded Name of Contact: No data recorded Contact Number: No data recorded Contact Fax Number: No data recorded Prescriber Name: Shirline Frees, NP  Prescriber Address (if known): No data recorded  What Is the Reason for Your Visit/Call Today? alcohol abuse  How Long Has This Been Causing You Problems? > than 6 months  What Do You Feel Would Help You the Most Today? Alcohol or Drug Use Treatment   Have You Recently Been in Any Inpatient Treatment (Hospital/Detox/Crisis Center/28-Day Program)? No (July 8th was in [facility] for surgery)  Name/Location of Program/Hospital:No data recorded How Long Were You There? No data recorded When Were You Discharged? No data recorded  Have You Ever Received Services From [facility] Before? Yes  Who Do You See at [facility]? No data recorded  Have You Recently Had Any Thoughts About Hurting Yourself? No  Are You Planning to Commit Suicide/Harm Yourself At This time? No   Have you Recently Had Thoughts About Hurting Someone Karolee Ohs? No  Explanation: No data recorded  Have You Used Any Alcohol or Drugs in the Past 24 Hours? Yes  How Long Ago Did You Use Drugs or Alcohol? No data recorded What Did You Use and How Much? a Scotch yesterday evening   Do You Currently Have a Therapist/Psychiatrist? Yes  Name of Therapist/Psychiatrist: Kristi Cornwell   Have You Been Recently Discharged From Any Office Practice or Programs? No  Explanation of Discharge From Practice/Program: No data recorded   Type of Therapy: Individual   Therapist  Response/Interventions: Motivational Interviewing/The therapist obtains history from Bill concerning his drinking and asks about what consequences he believes it has caused while also questioning what his Cardiologist might say about it given that he has some medical conditions which would seemingly contraindicate drinking alcohol.   The therapist does inform Annette Stable that if he were to return that he has a lot of information that he could provide him on this subject.   Treatment Goals addressed: The therapist defers doing a treatment plan as Annette Stable is ambivalent about coming to therapy to address his drinking; however, he does express some desire to stop due to the problems that it is causing and as his Sex Offender specific therapist indicates that drinking puts him at risk of reoffending.   Summary: Annette Stable presents for his initial visit with this therapist. He says that he was diagnosed with bipolar disorder by [facility] over 20 years ago scoring a 9 out of ten on some sort of test. He was prescribed Zyprexa and Prozac which sounds to have possibly been continued by his PCP after he was no longer seen at [facility] due to insurance issues. He got connected to Dr. Donell Beers for medication management. Dr. Donell Beers has continued his Prozac but discontinued his Zyprexa. The therapist has Annette Stable take the BSDS which suggests that he has a low probability of actually having a bipolar spectrum disorder.   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 He says that his father was an alcoholic and so were two of  his brothers. He says that his middle brother died from substance use adding that his brother sold drugs and use to rip people off.   Annette Stable says that his wife drinks about the same amount of alcohol that he does daily but that she drinks Gin. He says that she does not like his drinking as he will get verbally aggressive when he drinks. Annette Stable  concludes that he uses alcohol as a crutch.  Although he denies issues with depression, he becomes tearful briefly when talking about his marital problems and very tearful when talking about having to live with the restrictions that are in place due to being a registered sex offender. He has been converting to Judaism; however, he is not allowed to come to [place] as there are children there.   He says that his Cardiologist does not know about his drinking nor has he asked about it. Annette Stable says that he likely has PTSD due to a traumatic childhood.   Progress Towards Goals: Initial  Suicidal/Homicidal: No SI or HI  Diagnosis: Alcohol Use Disorder, Severe and Unspecified Affective Disorder (R/O PTSD)  Collaboration of Care: Other N/A  Patient/Guardian was advised Release of Information must be obtained prior to any record release in order to collaborate their care with an outside provider. Patient/Guardian was advised if they have not already done so to contact the registration department to sign all necessary forms in order for [place] to release information regarding their care.   Consent: Patient/Guardian gives verbal consent for treatment and assignment of benefits for services provided during this visit. Patient/Guardian expressed understanding and agreed to proceed.        CCA Substance Use Alcohol/Drug Use: Alcohol / Drug Use Pain Medications: None Prescriptions: Repatha, Prozac, Vistaril, Coreg,  and [place] Over the Counter: aspirin and vitamin History of alcohol / drug use?: Yes Longest period of sobriety (when/how long): two or three months Negative Consequences of Use: Personal relationships Withdrawal Symptoms: None Substance #1 Name of Substance 1: Alcohol 1 - Age of First Use: 19 1 - Amount (size/oz): four drinks 1 - Frequency: five or six days per week 1 - Duration: years 1 - Last Use / Amount: yesterday 1 - Method of Aquiring: legal 1- Route of Use: oral Substance  #2 Name of Substance 2: Tobacco 2 - Age of First Use: teenager 2 - Amount (size/oz): three cigarettes per day down from a pack per day 2 - Frequency: daily 2 - Duration: 50-60 years 2 - Last Use / Amount: this morning/cigarette 2 - Method of Aquiring: legal 2 - Route of Substance Use: smoking                     ASAM's:  Six Dimensions of Multidimensional Assessment  Dimension 1:  Acute Intoxication and/or Withdrawal Potential:   Dimension 1:  Description of individual's past and current experiences of substance use and withdrawal: denies withdrawal but possibly daily intoxication; has emphysema and coronary atery disease  Dimension 2:  Biomedical Conditions and Complications:   Dimension 2:  Description of patient's biomedical conditions and  complications: emphysema and coronary atery disease  Dimension 3:  Emotional, Behavioral, or Cognitive Conditions and Complications:  Dimension 3:  Description of emotional, behavioral, or cognitive conditions and complications: denies depression but has mild anxiety  Dimension 4:  Readiness to Change:  Dimension 4:  Description of Readiness to Change criteria: Precontemplation; does not see alcohol as a problem  Dimension 5:  Relapse, Continued use, or  Continued Problem Potential:  Dimension 5:  Relapse, continued use, or continued problem potential critiera description: drinks almost daily  Dimension 6:  Recovery/Living Environment:  Dimension 6:  Recovery/Iiving environment criteria description: wife drinks two full pours of gin daily but does not like Bill's drinking  ASAM Severity Score: ASAM's Severity Rating Score: 12  ASAM Recommended Level of Treatment: ASAM Recommended Level of Treatment: Level II Intensive Outpatient Treatment   Substance use Disorder (SUD) Substance Use Disorder (SUD)  Checklist Symptoms of Substance Use: Continued use despite persistent or recurrent social, interpersonal problems, caused or exacerbated by use,  Continued use despite having a persistent/recurrent physical/psychological problem caused/exacerbated by use, Persistent desire or unsuccessful efforts to cut down or control use, Substance(s) often taken in larger amounts or over longer times than was intended  Recommendations for Services/Supports/Treatments: Recommendations for Services/Supports/Treatments Recommendations For Services/Supports/Treatments: CD-IOP Intensive Chemical Dependency Program, Individual Therapy  DSM5 Diagnoses: Patient Active Problem List   Diagnosis Date Noted   AAA (abdominal aortic aneurysm) (HCC) 04/25/2023   Iliac artery aneurysm (HCC) 04/05/2023   Abdominal aortic aneurysm (AAA) without rupture (HCC) 03/15/2023   Plantar fasciitis of left foot 09/03/2020   Essential hypertension 05/06/2016   Multiple pulmonary nodules 01/01/2015   OSA (obstructive sleep apnea) 08/03/2013   COPD (chronic obstructive pulmonary disease) (HCC) 10/23/2012   Benign neoplasm of skin 07/31/2010   Eczema 07/31/2010   Pulmonary nodule less than 1 cm in diameter with low risk for malignant neoplasm 07/28/2010   POSTSURG PERCUT TRANSLUMINAL COR ANGPLSTY STS 08/07/2009   Hyperlipidemia 07/07/2009   Bipolar disorder (HCC) 07/07/2009   CAD (coronary artery disease) 07/07/2009   Alcohol abuse 07/04/2009   Tobacco use 07/04/2009   SYNCOPE 07/04/2009   Plan: will return on a p.r.n. basis as he wants to discuss this further with his wife  Myrna Blazer, [place], LCSW, [facility], LCAS 11/29/2023

## 2023-12-01 ENCOUNTER — Ambulatory Visit: Payer: Medicare Other | Admitting: Podiatry

## 2023-12-06 ENCOUNTER — Other Ambulatory Visit (HOSPITAL_COMMUNITY): Payer: Medicare Other

## 2023-12-06 ENCOUNTER — Ambulatory Visit: Payer: Medicare Other | Admitting: Vascular Surgery

## 2024-01-05 ENCOUNTER — Ambulatory Visit: Payer: Medicare Other | Admitting: Adult Health

## 2024-01-18 ENCOUNTER — Ambulatory Visit (HOSPITAL_COMMUNITY): Payer: Medicare Other | Admitting: Psychiatry

## 2024-02-02 ENCOUNTER — Encounter: Payer: Self-pay | Admitting: Podiatry

## 2024-02-02 ENCOUNTER — Ambulatory Visit (INDEPENDENT_AMBULATORY_CARE_PROVIDER_SITE_OTHER): Admitting: Podiatry

## 2024-02-02 DIAGNOSIS — B351 Tinea unguium: Secondary | ICD-10-CM | POA: Diagnosis not present

## 2024-02-02 DIAGNOSIS — M79676 Pain in unspecified toe(s): Secondary | ICD-10-CM | POA: Diagnosis not present

## 2024-02-02 NOTE — Progress Notes (Signed)
This patient returns to the office for evaluation and treatment of long thick painful nails .  This patient is unable to trim his own nails since the patient cannot reach the feet.  Patient says the nails are painful walking and wearing his shoes.  He returns for preventive foot care services.  General Appearance  Alert, conversant and in no acute stress.  Vascular  Dorsalis pedis and posterior tibial  pulses are palpable  bilaterally.  Capillary return is within normal limits  bilaterally. Temperature is within normal limits  bilaterally.  Neurologic  Senn-Weinstein monofilament wire test within normal limits  bilaterally. Muscle power within normal limits bilaterally.  Nails Thick disfigured discolored nails with subungual debris  from hallux to fifth toes bilaterally. No evidence of bacterial infection or drainage bilaterally.  Orthopedic  No limitations of motion  feet .  No crepitus or effusions noted.  No bony pathology or digital deformities noted.  Skin  normotropic skin with no porokeratosis noted bilaterally.  No signs of infections or ulcers noted.     Onychomycosis  Pain in toes right foot  Pain in toes left foot  Debridement  of nails  1-5  B/L with a nail nipper.  Nails were then filed using a dremel tool with no incidents.    RTC 3 months    Argie Lober DPM  

## 2024-02-08 ENCOUNTER — Other Ambulatory Visit: Payer: Self-pay | Admitting: Internal Medicine

## 2024-02-14 ENCOUNTER — Ambulatory Visit: Payer: Medicare Other | Admitting: Vascular Surgery

## 2024-02-14 ENCOUNTER — Ambulatory Visit (HOSPITAL_COMMUNITY)
Admission: RE | Admit: 2024-02-14 | Payer: Medicare Other | Source: Ambulatory Visit | Attending: Vascular Surgery | Admitting: Vascular Surgery

## 2024-02-21 ENCOUNTER — Ambulatory Visit: Payer: Self-pay | Admitting: Pulmonary Disease

## 2024-02-21 NOTE — Telephone Encounter (Signed)
 FYI pt has an appt with you on 02/22/2024

## 2024-02-21 NOTE — Telephone Encounter (Signed)
  Chief Complaint: nasal issues Symptoms: nasal congestion, L>R, some yellow drainage Frequency: x 1 month Pertinent Negatives: Patient denies fever, URI sx Disposition: [] ED /[] Urgent Care (no appt availability in office) / [x] Appointment(In office/virtual)/ []  [facility]/ [] Home Care/ [] Refused Recommended Disposition /[] [facility]/ []  Follow-up with PCP Additional Notes: Pt c/o nasal issues/congestion (L>R) x 1 month. Pt reports using OTC nasal spray with some relief, but feels like he is using too often. Pt denies any fevers, URI sx, SOB. Scheduled patient per protocol on 02/22/2024 with alternate provider. Patient verbalized understanding and to call back with worsening symptoms.      Copied from CRM [id number]. Topic: Clinical - Red Word Triage >> Feb 21, 2024  8:50 AM Justina Oman C wrote: Red Word that prompted transfer to Nurse Triage: Patient [phone] states having difficult with nose, feels like clasping and is close, difficult breathing, Patient denies fever, dizziness, pain nor shortness of breathe. Patient wants to see Dr. Villa Greaser, please advise. Reason for Disposition  [1] Nasal discharge AND [2] present > 10 days  Answer Assessment - Initial Assessment Questions E2C2 Pulmonary Triage - Initial Assessment Questions "Chief Complaint (e.g., cough, sob, wheezing, fever, chills, sweat or additional symptoms) *Go to specific symptom protocol after initial questions. Nasal congestion - mostly at night during sleep L > R nostril   "How long have symptoms been present?" X 1 month  Have you tested for COVID or Flu? Note: If not, ask patient if a home test can be taken. If so, instruct patient to call back for positive results. No  MEDICINES:   "Have you used any OTC meds to help with symptoms?" Yes If yes, ask "What medications?" [facility] Severe Nasal Spray - reports provides relief, but is using "more than he should"  "Have you used your inhalers/maintenance  medication?" Yes If yes, "What medications?" Albuterol  PRN - "I don't have the need to use it  If inhaler, ask "How many puffs and how often?" Note: Review instructions on medication in the chart. N/a  OXYGEN: "Do you wear supplemental oxygen?" No If yes, "How many liters are you supposed to use?" N/a  "Do you monitor your oxygen levels?" No If yes, "What is your reading (oxygen level) today?" N/a  "What is your usual oxygen saturation reading?"  (Note: Pulmonary O2 sats should be 90% or greater) Reports "typically good" - mid-90s in office    2. AMOUNT: "How much discharge is there?"      At times, "a little yellow" in color 3. COUGH: "Do you have a cough?" If Yes, ask: "Describe the color of your sputum" (clear, white, yellow, green)     denies 4. RESPIRATORY DISTRESS: "Describe your breathing."      denies 5. FEVER: "Do you have a fever?" If Yes, ask: "What is your temperature, how was it measured, and when did it start?"     denies 6. SEVERITY: "Overall, how bad are you feeling right now?" (e.g., doesn't interfere with normal activities, staying home from school/work, staying in bed)      Interferes with sleep 7. OTHER SYMPTOMS: "Do you have any other symptoms?" (e.g., sore throat, earache, wheezing, vomiting)     denies  Protocols used: Common Cold-A-AH

## 2024-02-22 ENCOUNTER — Encounter: Payer: Self-pay | Admitting: Nurse Practitioner

## 2024-02-22 ENCOUNTER — Ambulatory Visit: Admitting: Nurse Practitioner

## 2024-02-22 VITALS — BP 144/80 | HR 51 | Ht 66.0 in | Wt 145.4 lb

## 2024-02-22 DIAGNOSIS — J449 Chronic obstructive pulmonary disease, unspecified: Secondary | ICD-10-CM

## 2024-02-22 DIAGNOSIS — J310 Chronic rhinitis: Secondary | ICD-10-CM

## 2024-02-22 DIAGNOSIS — J432 Centrilobular emphysema: Secondary | ICD-10-CM

## 2024-02-22 MED ORDER — FLUTICASONE PROPIONATE 50 MCG/ACT NA SUSP: 2.0000 | Freq: Every day | NASAL | 2 refills | Status: DC

## 2024-02-22 NOTE — Progress Notes (Signed)
 @Patient  ID: Jeffrey Hudson, male    DOB: 23-May-1951, 73 y.o.   MRN: [id number]  Chief Complaint  Patient presents with   Follow-up    Nasal issues, started months ago    Referring provider: Alto Atta, NP  HPI: 73 year old male, active smoker followed for COPD and OSA. She is a patient of Dr. Hortense Lyons and last seen in office 01/05/2023. Past medical history significant for AAA without rupture, CAD, HTN, ETOH abuse, bipolar, HLD.  TEST/EVENTS:  2004 CT chest: 6 mm nodule LLL, also noted in 1995 03/2012 PFT: FVC 112, FEV1 99, ratio 62, TLC 105, DLCO 71 07/2020 LDCT: stable nodules, largest LUL 9 mm, moderate emphysema, atherosclerosis  09/2022 LDCT: stable nodules, RADS 2  01/06/2023 PFT: FVC 86, FEV1 76, ratio 66, TLC 90, DLCO 73. Moderate obstruction with reversibility   01/05/2023: OV with Dr. Villa Greaser. Main complaint aerophagia. Seen GI for this. Had severe GERD which is decreased with Prilosec. EGD and colonoscopy was negative. Awaiting second opinion. Complains of nasal blockage bilaterally, occasional sinus drainage. Decreased smoking to 2-3 cigarettes a day. He admits to not using Bevespi . Wanted to hold off on PFTs due to nasal symptoms. Overall, he is stable.   02/22/2024: Today - acute Discussed the use of AI scribe software for clinical note transcription with the patient, who gave verbal consent to proceed.  History of Present Illness   Jeffrey Hudson "Raenette Bumps" is a 73 year old male with mild COPD who presents with chronic sinus congestion.  He has been experiencing sinus congestion for the past couple of months, primarily affecting his ability to breathe through his nose at night. The congestion worsens when lying flat and improves upon getting up and moving around. He has been using Afrin nasal spray daily, which may be contributing to rebound congestion. He has not tried other nasal sprays like Flonase or Nasacort  yet. No facial tenderness, fevers, hemoptysis, sore throat,  headaches.   No current breathing difficulties compared to his baseline, and does not use inhalers. He describes himself as 'pretty sedentary' but does engage in walking. He experiences shortness of breath with physical exertion, such as carrying items or yard work.  He has a history of mild COPD, confirmed by previous lung function testing. He does not use a CPAP machine and denies any issues with PND, orthopnea, excessive fatigue. He experiences frequent belching, which he attributes to swallowing excessive air, and is currently taking a reflux medication. Follows with GI.       Allergies  Allergen Reactions   Lisinopril  Swelling    Angio edema    Metformin  And Related Diarrhea   Norvasc  [Amlodipine ] Swelling    Lower extremity     Immunization History  Administered Date(s) Administered   Fluad Quad(high Dose 65+) 06/21/2019   Influenza, High Dose Seasonal PF 08/06/2016, 08/23/2017, 06/29/2018, 06/21/2019   Influenza,inj,Quad PF,6+ Mos 08/03/2013, 09/21/2014, 07/28/2021   Influenza-Unspecified 11/03/2014, 09/07/2017, 07/18/2020   PFIZER Comirnaty(Gray Top)Covid-19 Tri-Sucrose Vaccine 01/31/2021   PFIZER(Purple Top)SARS-COV-2 Vaccination 12/02/2019, 12/25/2019, 07/25/2020, 01/31/2021, 09/03/2021   Pfizer(Comirnaty)Fall Seasonal Vaccine 12 years and older 10/04/2022   Pneumococcal Conjugate-13 02/04/2015   Pneumococcal Polysaccharide-23 08/06/2016   Td 02/04/2015   Zoster Recombinant(Shingrix) 06/21/2019, 10/05/2019   Zoster, Live 04/04/2015    Past Medical History:  Diagnosis Date   AAA (abdominal aortic aneurysm) (HCC)    AMI (acute myocardial infarction) (HCC)    Acute ST elevation   Anxiety    Arthritis    neck  Bipolar disorder (HCC)    COPD (chronic obstructive pulmonary disease) (HCC)    Coronary artery disease    a. s/p anterolateral STEMI with BMS placed in large diagonal branch (2009).   Depression    Emphysema of lung (HCC)    GERD (gastroesophageal reflux  disease)    Glaucoma    History of kidney stones    Hx of adenomatous polyp of colon 05/06/2021   diminutive - no f/u needed   Hyperlipidemia    Hypertension    Nodule of left lung    6-mm smooothly rounded nodule   Plantar fasciitis    left foot   Pre-diabetes    Syncope and collapse    in 2001 and 2004 with no clear cause   Tobacco abuse    Viral URI with cough 11/22/2016    Tobacco History: Social History   Tobacco Use  Smoking Status Every Day   Current packs/day: 0.25   Average packs/day: 1 pack/day for 55.3 years (54.3 ttl pk-yrs)   Types: Cigarettes   Start date: 1970  Smokeless Tobacco Never  Tobacco Comments   Patient states he's down to smoking 3 cigarettes a day as of 02/22/2024   Ready to quit: Not Answered Counseling given: Not Answered Tobacco comments: Patient states he's down to smoking 3 cigarettes a day as of 02/22/2024   Outpatient Medications Prior to Visit  Medication Sig Dispense Refill   albuterol  (PROAIR  HFA) 108 (90 Base) MCG/ACT inhaler Inhale 2 puffs into the lungs every 6 (six) hours as needed. 1 Inhaler 3   aspirin  81 MG tablet Take 81 mg by mouth daily.     betamethasone  dipropionate 0.05 % cream Apply 1 application  topically as needed (for eczema).     carvedilol  (COREG ) 3.125 MG tablet Take 1 tablet (3.125 mg total) by mouth 2 (two) times daily with a meal. 180 tablet 3   clobetasol  ointment (TEMOVATE ) 0.05 % Apply 1 application  topically 2 (two) times daily as needed (rash).     diclofenac Sodium (VOLTAREN) 1 % GEL Apply 1 Application topically 4 (four) times daily as needed (pain).     doxazosin  (CARDURA ) 2 MG tablet Take 1 tablet (2 mg total) by mouth daily. 90 tablet 3   Evolocumab  (REPATHA  SURECLICK) 140 MG/ML SOAJ Inject 140 mg into the skin every 14 (fourteen) days. 2 mL 11   fluocinonide  (LIDEX ) 0.05 % external solution Apply 1 Application topically 2 (two) times daily as needed (scalp irritation).     FLUoxetine  (PROZAC ) 20 MG  capsule Take 1 capsule (20 mg total) by mouth daily. 90 capsule 2   hydrOXYzine  (VISTARIL ) 25 MG capsule Take 1 capsule (25 mg total) by mouth 3 (three) times daily as needed. 30 capsule 4   latanoprost  (XALATAN ) 0.005 % ophthalmic solution Place 1 drop into both eyes at bedtime.     meloxicam  (MOBIC ) 7.5 MG tablet TAKE 1 TABLET BY MOUTH  DAILY 90 tablet 3   Multiple Vitamin (MULTIVITAMIN) tablet Take 1 tablet by mouth daily.     nitroGLYCERIN  (NITROSTAT ) 0.4 MG SL tablet Place 1 tablet (0.4 mg total) under the tongue every 5 (five) minutes as needed for chest pain. 25 tablet 11   oxymetazoline  (AFRIN) 0.05 % nasal spray Place 1 spray into both nostrils 2 (two) times daily as needed for congestion.     pantoprazole  (PROTONIX ) 40 MG tablet TAKE 1 TABLET BY MOUTH DAILY  BEFORE BREAKFAST 90 tablet 3   rosuvastatin  (CRESTOR ) 40 MG  tablet TAKE 1 TABLET BY MOUTH DAILY 90 tablet 3   timolol  (BETIMOL ) 0.5 % ophthalmic solution Place 1 drop into both eyes daily.     chlorthalidone  (HYGROTON ) 25 MG tablet Take 1 tablet (25 mg total) by mouth daily. 90 tablet 3   No facility-administered medications prior to visit.     Review of Systems:   Constitutional: No weight loss or gain, night sweats, fevers, chills, fatigue, or lassitude. HEENT: No headaches, difficulty swallowing, tooth/dental problems, or sore throat. No sneezing, itching, ear ache +nasal congestion, nasal drip CV:  No chest pain, orthopnea, PND, swelling in lower extremities, anasarca, dizziness, palpitations, syncope Resp: +baseline shortness of breath with exertion; chronic cough (stable). No excess mucus or change in color of mucus. No hemoptysis. No wheezing.  No chest wall deformity GI:  + heartburn, indigestion, belching (stable) .No abdominal pain, nausea, vomiting, diarrhea, change in bowel habits, loss of appetite, bloody stools.  GU: No dysuria, change in color of urine, urgency or frequency.  No flank pain, no hematuria  Skin: No  rash, lesions, ulcerations MSK:  No joint pain or swelling.   Neuro: No dizziness or lightheadedness.  Psych: No depression or anxiety. Mood stable.     Physical Exam:  BP (!) 144/80 (BP Location: Left Arm, Patient Position: Sitting, Cuff Size: Normal)   Pulse (!) 51   Ht 5\' 6"  (1.676 m)   Wt 145 lb 6.4 oz (66 kg)   SpO2 100%   BMI 23.47 kg/m   GEN: Pleasant, interactive, well-appearing; in no acute distress HEENT:  Normocephalic and atraumatic. PERRLA. Sclera white. Nasal turbinates erythematous, moist and patent bilaterally. White rhinorrhea present. Oropharynx pink and moist, without exudate or edema. No lesions, ulcerations NECK:  Supple w/ fair ROM. No JVD present. Normal carotid impulses w/o bruits. Thyroid  symmetrical with no goiter or nodules palpated. No lymphadenopathy.   CV: RRR, no m/r/g, no peripheral edema. Pulses intact, +2 bilaterally. No cyanosis, pallor or clubbing. PULMONARY:  Unlabored, regular breathing. Clear bilaterally A&P w/o wheezes/rales/rhonchi. No accessory muscle use.  GI: BS present and normoactive. Soft, non-tender to palpation. No organomegaly or masses detected.  MSK: No erythema, warmth or tenderness. Cap refil <2 sec all extrem. No deformities or joint swelling noted.  Neuro: A/Ox3. No focal deficits noted.   Skin: Warm, no lesions or rashe Psych: Normal affect and behavior. Judgement and thought content appropriate.     Lab Results:  CBC    Component Value Date/Time   WBC 8.9 04/26/2023 0330   RBC 2.83 (L) 04/26/2023 0330   HGB 9.3 (L) 04/26/2023 0330   HGB 14.6 08/05/2020 0842   HCT 27.1 (L) 04/26/2023 0330   HCT 41.9 08/05/2020 0842   PLT 139 (L) 04/26/2023 0330   PLT 133 (L) 08/05/2020 0842   MCV 95.8 04/26/2023 0330   MCV 95 08/05/2020 0842   MCH 32.9 04/26/2023 0330   MCHC 34.3 04/26/2023 0330   RDW 12.2 04/26/2023 0330   RDW 12.6 08/05/2020 0842   LYMPHSABS 1.7 01/15/2021 0926   MONOABS 1.2 (H) 01/15/2021 0926   EOSABS 0.4  01/15/2021 0926   BASOSABS 0.1 01/15/2021 0926    BMET    Component Value Date/Time   NA 139 07/12/2023 0832   K 4.2 07/12/2023 0832   CL 98 07/12/2023 0832   CO2 25 07/12/2023 0832   GLUCOSE 112 (H) 07/12/2023 0832   GLUCOSE 249 (H) 04/26/2023 0330   BUN 15 07/12/2023 0832   CREATININE 0.93 07/12/2023 8295  CREATININE 0.86 05/01/2020 1004   CALCIUM  9.5 07/12/2023 0832   GFRNONAA >60 04/26/2023 0330   GFRAA 96 08/05/2020 0842    BNP No results found for: "BNP"   Imaging:  No results found.  Administration History     None          Latest Ref Rng & Units 01/06/2023    9:18 AM  PFT Results  FVC-Pre L 3.02   FVC-Predicted Pre % 86   FVC-Post L 3.37   FVC-Predicted Post % 96   Pre FEV1/FVC % % 64   Post FEV1/FCV % % 66   FEV1-Pre L 1.92   FEV1-Predicted Pre % 76   FEV1-Post L 2.23   DLCO uncorrected ml/min/mmHg 15.90   DLCO UNC% % 73   DLCO corrected ml/min/mmHg 15.90   DLCO COR %Predicted % 73   DLVA Predicted % 79   TLC L 5.48   TLC % Predicted % 90   RV % Predicted % 103     No results found for: "NITRICOXIDE"      Assessment & Plan:   Chronic rhinitis Chronic rhinitis; likely exacerbated by overuse of afrin. Advised to discontinue use gradually and start intranasal steroid. Start saline rinses 1-2 times a day. Check allergen panel. Consider allergy vs ENT referral pending results.   Patient Instructions  Continue Albuterol  inhaler 2 puffs every 6 hours as needed for shortness of breath or wheezing. Notify if symptoms persist despite rescue inhaler/neb use.  Continue pantoprazole  daily  -Use saline nasal rinses 1-2 times a day such as a Patent examiner or [employer]. Use bottled distilled water. Follow with the flonase 20-30 minutes later in evening  -Flonase nasal spray 2 sprays each nostril daily -Guaifenesin  (Mucinex ) 600 mg Twice daily for congestion   Allergy testing today   Follow up in 4-6 weeks with Dr. Villa Greaser or Gina Lagos. If  symptoms do not improve or worsen, please contact office for sooner follow up or seek emergency care.    COPD (chronic obstructive pulmonary disease) (HCC) Mild COPD. Low symptom burden; stable. Not currently on scheduled bronchodilator. Action plan in place.    Advised if symptoms do not improve or worsen, to please contact office for sooner follow up or seek emergency care.   I spent 35 minutes of dedicated to the care of this patient on the date of this encounter to include pre-visit review of records, face-to-face time with the patient discussing conditions above, post visit ordering of testing, clinical documentation with the electronic health record, making appropriate referrals as documented, and communicating necessary findings to members of the patients care team.  Roetta Clarke, NP 02/22/2024  Pt aware and understands NP's role.

## 2024-02-22 NOTE — Assessment & Plan Note (Signed)
 Chronic rhinitis; likely exacerbated by overuse of afrin. Advised to discontinue use gradually and start intranasal steroid. Start saline rinses 1-2 times a day. Check allergen panel. Consider allergy vs ENT referral pending results.   Patient Instructions  Continue Albuterol  inhaler 2 puffs every 6 hours as needed for shortness of breath or wheezing. Notify if symptoms persist despite rescue inhaler/neb use.  Continue pantoprazole  daily  -Use saline nasal rinses 1-2 times a day such as a Patent examiner or [employer]. Use bottled distilled water. Follow with the flonase 20-30 minutes later in evening  -Flonase nasal spray 2 sprays each nostril daily -Guaifenesin  (Mucinex ) 600 mg Twice daily for congestion   Allergy testing today   Follow up in 4-6 weeks with Dr. Villa Greaser or Gina Lagos. If symptoms do not improve or worsen, please contact office for sooner follow up or seek emergency care.

## 2024-02-22 NOTE — Assessment & Plan Note (Signed)
 Mild COPD. Low symptom burden; stable. Not currently on scheduled bronchodilator. Action plan in place.

## 2024-02-22 NOTE — Patient Instructions (Addendum)
 Continue Albuterol  inhaler 2 puffs every 6 hours as needed for shortness of breath or wheezing. Notify if symptoms persist despite rescue inhaler/neb use.  Continue pantoprazole  daily  -Use saline nasal rinses 1-2 times a day such as a Patent examiner or [employer]. Use bottled distilled water. Follow with the flonase 20-30 minutes later in evening  -Flonase nasal spray 2 sprays each nostril daily -Guaifenesin  (Mucinex ) 600 mg Twice daily for congestion   Allergy testing today   Follow up in 4-6 weeks with Dr. Villa Greaser or Gina Lagos. If symptoms do not improve or worsen, please contact office for sooner follow up or seek emergency care.

## 2024-02-25 LAB — ALLERGEN PANEL (27) + IGE
Alternaria Alternata IgE: <0.1 kU/L
Aspergillus Fumigatus IgE: <0.1 kU/L
Bahia Grass IgE: <0.1 kU/L
Bermuda Grass IgE: <0.1 kU/L
Cat Dander IgE: <0.1 kU/L
Cedar, Mountain IgE: <0.1 kU/L
Cladosporium Herbarum IgE: <0.1 kU/L
Cocklebur IgE: <0.1 kU/L
Cockroach, American IgE: <0.1 kU/L
Common Silver Birch IgE: <0.1 kU/L
D Farinae IgE: <0.1 kU/L
D Pteronyssinus IgE: <0.1 kU/L
Dog Dander IgE: <0.1 kU/L
Elm, American IgE: <0.1 kU/L
Hickory, White IgE: <0.1 kU/L
IgE (Immunoglobulin E), Serum: 16 [IU]/mL (ref 6–495)
Johnson Grass IgE: <0.1 kU/L
Kentucky Bluegrass IgE: <0.1 kU/L
Maple/Box Elder IgE: <0.1 kU/L
Mucor Racemosus IgE: <0.1 kU/L
Oak, White IgE: <0.1 kU/L
Penicillium Chrysogen IgE: <0.1 kU/L
Pigweed, Rough IgE: <0.1 kU/L
Plantain, English IgE: <0.1 kU/L
Ragweed, Short IgE: <0.1 kU/L
Setomelanomma Rostrat: <0.1 kU/L
Timothy Grass IgE: <0.1 kU/L
White Mulberry IgE: <0.1 kU/L

## 2024-02-27 ENCOUNTER — Ambulatory Visit (HOSPITAL_COMMUNITY)
Admission: RE | Admit: 2024-02-27 | Discharge: 2024-02-27 | Disposition: A | Discharge status: Home / Self Care | Source: Ambulatory Visit | Attending: Vascular Surgery | Admitting: Vascular Surgery

## 2024-02-27 DIAGNOSIS — I7143 Infrarenal abdominal aortic aneurysm, without rupture: Secondary | ICD-10-CM | POA: Insufficient documentation

## 2024-02-27 NOTE — Progress Notes (Unsigned)
 Patient name: Jeffrey Hudson MRN: [id number] DOB: 29-Mar-1951 Sex: male  REASON FOR CONSULT: 6 month follow-up after EVAR and right iliac branch device  HPI: Jeffrey Hudson is a 73 y.o. male, with hx COPD, CAD s/p STEMI, HTN, HLD, tobacco abuse that presents for 6 month follow-up for ongoing surveillance after previous EVAR with right iliac branch endoprosthesis.  On 04/25/2023 he underwent EVAR plus right iliac branch endoprosthesis for a 3.8 cm AAA and 5.4 cm right common iliac aneurysm.   No complaints or concerns on follow-up today.  No abdominal pain.  Past Medical History:  Diagnosis Date   AAA (abdominal aortic aneurysm) (HCC)    AMI (acute myocardial infarction) (HCC)    Acute ST elevation   Anxiety    Arthritis    neck   Bipolar disorder (HCC)    COPD (chronic obstructive pulmonary disease) (HCC)    Coronary artery disease    a. s/p anterolateral STEMI with BMS placed in large diagonal branch (2009).   Depression    Emphysema of lung (HCC)    GERD (gastroesophageal reflux disease)    Glaucoma    History of kidney stones    Hx of adenomatous polyp of colon 05/06/2021   diminutive - no f/u needed   Hyperlipidemia    Hypertension    Nodule of left lung    6-mm smooothly rounded nodule   Plantar fasciitis    left foot   Pre-diabetes    Syncope and collapse    in 2001 and 2004 with no clear cause   Tobacco abuse    Viral URI with cough 11/22/2016    Past Surgical History:  Procedure Laterality Date   ABDOMINAL AORTIC ENDOVASCULAR STENT GRAFT Bilateral 04/25/2023   Procedure: ABDOMINAL AORTIC ENDOVASCULAR STENT GRAFT WITH RIGHT ILIAC BRANCH ENDOPROSTHESIS;  Surgeon: Young Hensen, MD;  Location: [facility] OR;  Service: Vascular;  Laterality: Bilateral;   ABDOMINAL SURGERY  04/25/2023   COLONOSCOPY     PERCUTANEOUS CORONARY STENT INTERVENTION (PCI-S)     TONSILLECTOMY     removed as a child, also removed uvula   ULTRASOUND GUIDANCE FOR VASCULAR ACCESS  Bilateral 04/25/2023   Procedure: ULTRASOUND GUIDANCE FOR VASCULAR ACCESS;  Surgeon: Young Hensen, MD;  Location: [facility] OR;  Service: Vascular;  Laterality: Bilateral;   UPPER GASTROINTESTINAL ENDOSCOPY      Family History  Problem Relation Age of Onset   Dementia Mother 64       died   Alcohol abuse Father 80   Depression Brother    Healthy Son    Colon cancer Neg Hx    Esophageal cancer Neg Hx    Rectal cancer Neg Hx    Stomach cancer Neg Hx     SOCIAL HISTORY: Social History   Socioeconomic History   Marital status: Married    Spouse name: Not on file   Number of children: 1   Years of education: Not on file   Highest education level: Bachelor's degree (e.g., BA, AB, BS)  Occupational History   Occupation: Retired-Sales/broker  Tobacco Use   Smoking status: Every Day    Current packs/day: 0.25    Average packs/day: 1 pack/day for 55.4 years (54.3 ttl pk-yrs)    Types: Cigarettes    Start date: 1970   Smokeless tobacco: Never   Tobacco comments:    Patient states he's down to smoking 3 cigarettes a day as of 02/22/2024  Vaping Use   Vaping status: Never Used  Substance and Sexual Activity   Alcohol use: Yes    Alcohol/week: 14.0 standard drinks of alcohol    Types: 14 Shots of liquor per week   Drug use: Not Currently   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[employer]   He has been married for 35 years    One child (son)       He likes to [employer] - plays once a week    2 glasses of wine a day max, still smoking 2 cups of coffee a day no drug use or other tobacco   Social Drivers of Corporate investment banker Strain: Low Risk  (05/27/2023)   Overall Financial Resource Strain (CARDIA)    Difficulty of Paying Living Expenses: Not hard at all  Food Insecurity: No Food Insecurity (05/27/2023)   Hunger Vital Sign    Worried About  Running Out of Food in the Last Year: Never true    Ran Out of Food in the Last Year: Never true  Transportation Needs: No Transportation Needs (05/27/2023)   PRAPARE - Administrator, Civil Service (Medical): No    Lack of Transportation (Non-Medical): No  Physical Activity: Insufficiently Active (05/27/2023)   Exercise Vital Sign    Days of Exercise per Week: 3 days    Minutes of Exercise per Session: 20 min  Stress: No Stress Concern Present (05/27/2023)   [employer] of Occupational Health - Occupational Stress Questionnaire    Feeling of Stress : Not at all  Social Connections: Moderately Integrated (05/27/2023)   Social Connection and Isolation Panel [NHANES]    Frequency of Communication with Friends and Family: More than three times a week    Frequency of Social Gatherings with Friends and Family: Three times a week    Attends Religious Services: Never    Active Member of Clubs or Organizations: Yes    Attends Banker Meetings: Never    Marital Status: Married  Catering manager Violence: Not At Risk (05/27/2023)   Humiliation, Afraid, Rape, and Kick questionnaire    Fear of Current or Ex-Partner: No    Emotionally Abused: No    Physically Abused: No    Sexually Abused: No    Allergies  Allergen Reactions   Lisinopril  Swelling    Angio edema    Metformin  And Related Diarrhea   Norvasc  [Amlodipine ] Swelling    Lower extremity     Current Outpatient Medications  Medication Sig Dispense Refill   albuterol  (PROAIR  HFA) 108 (90 Base) MCG/ACT inhaler Inhale 2 puffs into the lungs every 6 (six) hours as needed. 1 Inhaler 3   aspirin  81 MG tablet Take 81 mg by mouth daily.     betamethasone  dipropionate 0.05 % cream Apply 1 application  topically as needed (for eczema).     carvedilol  (COREG ) 3.125 MG tablet Take 1 tablet (3.125 mg total) by mouth 2 (two) times daily with a meal. 180 tablet 3   chlorthalidone  (HYGROTON ) 25 MG tablet Take 1 tablet (25 mg  total) by mouth daily. 90 tablet 3   clobetasol  ointment (TEMOVATE ) 0.05 % Apply 1 application  topically 2 (two) times daily as needed (rash).     diclofenac Sodium (VOLTAREN) 1 % GEL Apply 1 Application topically 4 (four) times daily as needed (pain).     doxazosin  (CARDURA ) 2 MG tablet  Take 1 tablet (2 mg total) by mouth daily. 90 tablet 3   Evolocumab  (REPATHA  SURECLICK) 140 MG/ML SOAJ Inject 140 mg into the skin every 14 (fourteen) days. 2 mL 11   fluocinonide  (LIDEX ) 0.05 % external solution Apply 1 Application topically 2 (two) times daily as needed (scalp irritation).     FLUoxetine  (PROZAC ) 20 MG capsule Take 1 capsule (20 mg total) by mouth daily. 90 capsule 2   fluticasone  (FLONASE ) 50 MCG/ACT nasal spray Place 2 sprays into both nostrils daily. 18.2 mL 2   hydrOXYzine  (VISTARIL ) 25 MG capsule Take 1 capsule (25 mg total) by mouth 3 (three) times daily as needed. 30 capsule 4   latanoprost  (XALATAN ) 0.005 % ophthalmic solution Place 1 drop into both eyes at bedtime.     meloxicam  (MOBIC ) 7.5 MG tablet TAKE 1 TABLET BY MOUTH  DAILY 90 tablet 3   Multiple Vitamin (MULTIVITAMIN) tablet Take 1 tablet by mouth daily.     nitroGLYCERIN  (NITROSTAT ) 0.4 MG SL tablet Place 1 tablet (0.4 mg total) under the tongue every 5 (five) minutes as needed for chest pain. 25 tablet 11   oxymetazoline  (AFRIN) 0.05 % nasal spray Place 1 spray into both nostrils 2 (two) times daily as needed for congestion.     pantoprazole  (PROTONIX ) 40 MG tablet TAKE 1 TABLET BY MOUTH DAILY  BEFORE BREAKFAST 90 tablet 3   rosuvastatin  (CRESTOR ) 40 MG tablet TAKE 1 TABLET BY MOUTH DAILY 90 tablet 3   timolol  (BETIMOL ) 0.5 % ophthalmic solution Place 1 drop into both eyes daily.     No current facility-administered medications for this visit.    REVIEW OF SYSTEMS:  [X]  denotes positive finding, [ ]  denotes negative finding Cardiac  Comments:  Chest pain or chest pressure:    Shortness of breath upon exertion:    Short  of breath when lying flat:    Irregular heart rhythm:        Vascular    Pain in calf, thigh, or hip brought on by ambulation:    Pain in feet at night that wakes you up from your sleep:     Blood clot in your veins:    Leg swelling:         Pulmonary    Oxygen at home:    Productive cough:     Wheezing:         Neurologic    Sudden weakness in arms or legs:     Sudden numbness in arms or legs:     Sudden onset of difficulty speaking or slurred speech:    Temporary loss of vision in one eye:     Problems with dizziness:         Gastrointestinal    Blood in stool:     Vomited blood:         Genitourinary    Burning when urinating:     Blood in urine:        Psychiatric    Major depression:         Hematologic    Bleeding problems:    Problems with blood clotting too easily:        Skin    Rashes or ulcers:        Constitutional    Fever or chills:      PHYSICAL EXAM: There were no vitals filed for this visit.   GENERAL: The patient is a well-nourished male, in no acute distress. The vital signs are documented  above. CARDIAC: There is a regular rate and rhythm.  VASCULAR:  Bilateral femoral pulses palpable Bilateral PT pulses palpable ABDOMEN: Soft and non-tender.  No pain with palpation of aneurysm   DATA:   EVAR duplex today shows aorta maximal diameter 4.36 cm and right common iliac maximal diameter 5.3 cm  CTA reviewed 05/24/2023 with both the infrarenal stent graft and right iliac branch endoprosthesis in good position.  All the limbs are widely patent.  Both internal iliac arteries are patent.  There is a delayed type II endoleak likely from a lumbar.  Assessment/Plan:  73 y.o. male, with hx COPD, CAD s/p STEMI, HTN, HLD, tobacco abuse that presents for 6 month follow-up for ongoing surveillance  after previous EVAR with right iliac branch endoprosthesis.  On 04/25/2023 he underwent EVAR plus right iliac branch endoprosthesis for a 3.8 cm AAA and 5.4 cm  right common iliac aneurysm.    Overall I think his duplex looks good.  He does have a known type II endoleak.  Discussed we will continue to follow this unless there is significant growth.  I will see him again in 6 months with EVAR duplex.   Young Hensen, MD [facility] Office: [phone]

## 2024-02-28 ENCOUNTER — Encounter: Payer: Self-pay | Admitting: Vascular Surgery

## 2024-02-28 ENCOUNTER — Ambulatory Visit: Attending: Vascular Surgery | Admitting: Vascular Surgery

## 2024-02-28 VITALS — BP 144/87 | HR 65 | Temp 98.2°F | Resp 20 | Ht 66.0 in | Wt 147.7 lb

## 2024-02-28 DIAGNOSIS — I723 Aneurysm of iliac artery: Secondary | ICD-10-CM

## 2024-02-28 DIAGNOSIS — I7143 Infrarenal abdominal aortic aneurysm, without rupture: Secondary | ICD-10-CM | POA: Diagnosis not present

## 2024-02-29 ENCOUNTER — Ambulatory Visit: Payer: Self-pay

## 2024-02-29 ENCOUNTER — Other Ambulatory Visit: Payer: Self-pay

## 2024-02-29 DIAGNOSIS — J310 Chronic rhinitis: Secondary | ICD-10-CM

## 2024-03-02 ENCOUNTER — Other Ambulatory Visit: Payer: Self-pay | Admitting: *Deleted

## 2024-03-02 DIAGNOSIS — I7143 Infrarenal abdominal aortic aneurysm, without rupture: Secondary | ICD-10-CM

## 2024-03-06 ENCOUNTER — Other Ambulatory Visit: Payer: Self-pay | Admitting: Adult Health

## 2024-03-06 NOTE — Telephone Encounter (Signed)
 Copied from CRM [id number]. Topic: Clinical - Medication Refill >> Mar 06, 2024 11:37 AM Dewanda Foots wrote: Medication:  meloxicam  (MOBIC ) 7.5 MG tablet    Has the patient contacted their pharmacy? Yes, but they do not have it currently.  (Agent: If no, request that the patient contact the pharmacy for the refill. If patient does not wish to contact the pharmacy document the reason why and proceed with request.) (Agent: If yes, when and what did the pharmacy advise?)  This is the patient's preferred pharmacy:  [facility] - [place], [place] - [address] [facility] [address] [place] [address] [place] [address] Phone: [phone] Fax: [phone]  Is this the correct pharmacy for this prescription? Yes If no, delete pharmacy and type the correct one.   Has the prescription been filled recently? No  Is the patient out of the medication? No  Has the patient been seen for an appointment in the last year OR does the patient have an upcoming appointment? Yes  Can we respond through MyChart? Yes  Agent: Please be advised that Rx refills may take up to 3 business days. We ask that you follow-up with your pharmacy.

## 2024-03-07 MED ORDER — MELOXICAM 7.5 MG PO TABS: 7.5000 mg | ORAL_TABLET | Freq: Every day | ORAL | 3 refills | Status: AC

## 2024-03-08 ENCOUNTER — Institutional Professional Consult (permissible substitution) (INDEPENDENT_AMBULATORY_CARE_PROVIDER_SITE_OTHER): Admitting: Otolaryngology

## 2024-03-22 ENCOUNTER — Encounter (HOSPITAL_COMMUNITY): Payer: Self-pay

## 2024-03-22 ENCOUNTER — Ambulatory Visit (HOSPITAL_COMMUNITY): Admitting: Licensed Clinical Social Worker

## 2024-03-22 DIAGNOSIS — Z720 Tobacco use: Secondary | ICD-10-CM

## 2024-03-22 DIAGNOSIS — F4310 Post-traumatic stress disorder, unspecified: Secondary | ICD-10-CM

## 2024-03-22 DIAGNOSIS — F1020 Alcohol dependence, uncomplicated: Secondary | ICD-10-CM

## 2024-03-22 DIAGNOSIS — F39 Unspecified mood [affective] disorder: Secondary | ICD-10-CM
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
## 2024-04-03 ENCOUNTER — Other Ambulatory Visit: Payer: Self-pay | Admitting: Physician Assistant

## 2024-04-12 ENCOUNTER — Ambulatory Visit (HOSPITAL_COMMUNITY): Admitting: Licensed Clinical Social Worker

## 2024-04-12 DIAGNOSIS — Z720 Tobacco use: Secondary | ICD-10-CM

## 2024-04-12 DIAGNOSIS — F4310 Post-traumatic stress disorder, unspecified: Secondary | ICD-10-CM

## 2024-04-12 DIAGNOSIS — F3132 Bipolar disorder, current episode depressed, moderate: Secondary | ICD-10-CM

## 2024-04-12 DIAGNOSIS — F1010 Alcohol abuse, uncomplicated: Secondary | ICD-10-CM

## 2024-04-12 NOTE — Progress Notes (Signed)
 THERAPIST PROGRESS NOTE  Session Time: 9:02 a.m. to 10:03 p.m.   Type of Therapy: Individual   Therapist Response/Interventions: Solution Focused/the therapist makes Jeffrey Hudson aware of the services provided by [facility] suggesting that having a quit coach might help him to be more successful in achieving complete smoking cessation.  The therapist addresses Jeffrey Hudson's resistance to the idea of dropping from 2 drinks per day to complete abstinence.  The therapist has Jeffrey Hudson take the BSDS informing him that the results are similar to those that he received when he took the MDQ in that they are suggestive of his having a bipolar spectrum disorder.  The therapist informs him that he could speak with Dr. Tasia about possibly restarting his Zyprexa ; however, the therapist explains that there an array of possible mood stabilizers from which to choose from which are more customarily used to treat bipolar disorder than Zyprexa .  The therapist strongly supports Jeffrey Hudson's goal of not remaining idle given that isolation and having too much time on one's hands are typically not conducive to successful recovery from addiction.  Treatment Goals addressed:  Active     Substance Use      Jeffrey Hudson will report a reduction in his symptoms of posttraumatic stress disorder as measured by the PCL-5 and by his PHQ-9 and GAD-7 both being a 4 or less. (Not Progressing)     Start:  03/22/24    Expected End:  09/21/24         Jeffrey Hudson will continue to reduce his alcohol consumption down from 1-1-1/2 standard drinks per day and additionally will decrease his tobacco use from his current level of 3 to 4 cigarettes/day. (Not Progressing)     Start:  03/22/24    Expected End:  09/21/24         The therapist will assist Jeffrey Hudson in being able to identify and change thoughts and behaviors that contribute to his depression and anxiety.     Start:  03/22/24         The therapist will assist Jeffrey Hudson in being able to identify and avoid triggers  for using both alcohol and tobacco.     Start:  03/22/24               Summary: Jeffrey Hudson presents having an appointment scheduled with Dr. Tasia on 05/16/24. Jeffrey Hudson questions if he truly has a bipolar spectrum disorder. He says that he has 10 yes items in the past when he took the MDQ. The therapist has him take the Bipolar Spectrum Diagnostic Scale with Jeffrey Hudson scoring a 24/25 strongly suggesting that he does have a bipolar spectrum disorder. He says that he wants to talk with Dr. Tasia about a mood stabilizer but does not know how to do so. Years ago, he was on Symbyax through [facility] and did well on it but ran into problems with his insurance coverage so was put on Zyprexa  and Prozac  but Dr. Tasia took him off the Zyprexa  due to the length of time he was on it. Jeffrey Hudson says that his wife noticed the changes in his mood when the Zyprexa  was stopped.   He is drinking two glasses of wine per day and averaging three or four cigarettes per day.Jeffrey Hudson says that he has a hell of a lot of time on his hands during the day which leaves to ruminating which has a lot to do with his drinking. So, he is considering volunteering and getting out of the house rather than his constant tearing  himself down.   Jeffrey Hudson reports no change in his mood from when he last met with this therapist.   Progress Towards Goals: Not progressing  Suicidal/Homicidal: No SI or HI  Plan: Jeffrey Hudson says that he would like to return in 1 month to give himself time to work on his goals of finding activities such as volunteering to get him out of the house and his goal of contacting [facility].  Diagnosis: PTSD, unspecified affect of disorder (rule out major depression,) alcohol use disorder severe, and tobacco use disorder; Bipolar Disorder  Collaboration of Care: Other N/A  Patient/Guardian was advised Release of Information must be obtained prior to any record release in order to collaborate their care with an outside  provider. Patient/Guardian was advised if they have not already done so to contact the registration department to sign all necessary forms in order for us  to release information regarding their care.   Consent: Patient/Guardian gives verbal consent for treatment and assignment of benefits for services provided during this visit. Patient/Guardian expressed understanding and agreed to proceed.   Jeffrey Hudson Maier, MA, LCSW, [facility], LCAS 04/12/2024

## 2024-04-19 ENCOUNTER — Ambulatory Visit: Payer: Self-pay

## 2024-04-19 NOTE — Telephone Encounter (Signed)
 FYI Only or Action Required?: Action required by provider: request for appointment.  Patient was last seen in primary care on 08/23/2023 by Merna Huxley, NP. Called Nurse Triage reporting Chest Pain. Symptoms began about a month ago. Interventions attempted: Nothing. Symptoms are: unchanged. Having chest pain x 1 month. Pain increasing at times. Dizziness at times. Declines visit today, requests appointment next week. Instructed to go to ED for worsening of symptoms. Triage Disposition: See Physician Within 24 Hours  Patient/caregiver understands and will follow disposition?: Yes    Copied from CRM [id number]. Topic: Clinical - Red Word Triage >> Apr 19, 2024  8:26 AM Rosina BIRCH wrote: Reason for RMF:Ryzdu pain for a month but it has become more greater Reason for Disposition  [1] Chest pain lasts > 5 minutes AND [2] occurred > 3 days ago (72 hours) AND [3] NO chest pain or cardiac symptoms now  Answer Assessment - Initial Assessment Questions 1. LOCATION: Where does it hurt?       Right side 2. RADIATION: Does the pain go anywhere else? (e.g., into neck, jaw, arms, back)     Right shoulder 3. ONSET: When did the chest pain begin? (Minutes, hours or days)      1 month 4. PATTERN: Does the pain come and go, or has it been constant since it started?  Does it get worse with exertion?      Constant 5. DURATION: How long does it last (e.g., seconds, minutes, hours)     Worse 6. SEVERITY: How bad is the pain?  (e.g., Scale 1-10; mild, moderate, or severe)    - MILD (1-3): doesn't interfere with normal activities     - MODERATE (4-7): interferes with normal activities or awakens from sleep    - SEVERE (8-10): excruciating pain, unable to do any normal activities       Now - 6 7. CARDIAC RISK FACTORS: Do you have any history of heart problems or risk factors for heart disease? (e.g., angina, prior heart attack; diabetes, high blood pressure, high cholesterol, smoker, or strong  family history of heart disease)     Stent, HTN 8. PULMONARY RISK FACTORS: Do you have any history of lung disease?  (e.g., blood clots in lung, asthma, emphysema, birth control pills)     COPD 9. CAUSE: What do you think is causing the chest pain?     Unsure 10. OTHER SYMPTOMS: Do you have any other symptoms? (e.g., dizziness, nausea, vomiting, sweating, fever, difficulty breathing, cough)       Some dizziness 11. PREGNANCY: Is there any chance you are pregnant? When was your last menstrual period?       N/a  Protocols used: Chest Pain-A-AH

## 2024-04-24 DIAGNOSIS — H401131 Primary open-angle glaucoma, bilateral, mild stage: Secondary | ICD-10-CM | POA: Diagnosis not present

## 2024-04-26 ENCOUNTER — Ambulatory Visit: Admitting: Adult Health

## 2024-04-26 ENCOUNTER — Encounter: Payer: Self-pay | Admitting: Adult Health

## 2024-04-26 VITALS — BP 128/70 | HR 63 | Temp 98.3°F | Ht 66.0 in | Wt 147.0 lb

## 2024-04-26 DIAGNOSIS — T148XXA Other injury of unspecified body region, initial encounter: Secondary | ICD-10-CM

## 2024-04-26 MED ORDER — CYCLOBENZAPRINE HCL 10 MG PO TABS: 10.0000 mg | ORAL_TABLET | Freq: Three times a day (TID) | ORAL | 0 refills | Status: DC | PRN

## 2024-04-26 NOTE — Progress Notes (Signed)
 Subjective:    Patient ID: Jeffrey Hudson, male    DOB: 08/01/1951, 73 y.o.   MRN: [id number]  HPI 73 year old male who  has a past medical history of AAA (abdominal aortic aneurysm) (HCC), AMI (acute myocardial infarction) (HCC), Anxiety, Arthritis, Bipolar disorder (HCC), COPD (chronic obstructive pulmonary disease) (HCC), Coronary artery disease, Depression, Emphysema of lung (HCC), GERD (gastroesophageal reflux disease), Glaucoma, History of kidney stones, adenomatous polyp of colon (05/06/2021), Hyperlipidemia, Hypertension, Nodule of left lung, Plantar fasciitis, Pre-diabetes, Syncope and collapse, Tobacco abuse, and Viral URI with cough (11/22/2016).  He presents to the office today for an acute visit.  He reports that for the last month and 1/2 to 2 months he has had right sided chest/shoulder/back pain.  His symptoms started after he [place] the mountains and could not get his cot set up so ended up sleeping on the ground for 2 nights.  Reports having pain that radiates from under his right breast into his right axilla and up around his right shoulder blade that is especially apparent with movements such as lifting his arm reaching out or reaching above his head. He has been using prescribed meloxicam  without much relief.  He does report that hot showers help but this is only temporary.   Review of Systems See HPI   Past Medical History:  Diagnosis Date   AAA (abdominal aortic aneurysm) (HCC)    AMI (acute myocardial infarction) (HCC)    Acute ST elevation   Anxiety    Arthritis    neck   Bipolar disorder (HCC)    COPD (chronic obstructive pulmonary disease) (HCC)    Coronary artery disease    a. s/p anterolateral STEMI with BMS placed in large diagonal branch (2009).   Depression    Emphysema of lung (HCC)    GERD (gastroesophageal reflux disease)    Glaucoma    History of kidney stones    Hx of adenomatous polyp of colon 05/06/2021   diminutive - no f/u needed    Hyperlipidemia    Hypertension    Nodule of left lung    6-mm smooothly rounded nodule   Plantar fasciitis    left foot   Pre-diabetes    Syncope and collapse    in 2001 and 2004 with no clear cause   Tobacco abuse    Viral URI with cough 11/22/2016    Social History   Socioeconomic History   Marital status: Married    Spouse name: Not on file   Number of children: 1   Years of education: Not on file   Highest education level: Bachelor's degree (e.g., BA, AB, BS)  Occupational History   Occupation: Retired-Sales/broker  Tobacco Use   Smoking status: Every Day    Current packs/day: 0.25    Average packs/day: 1 pack/day for 55.5 years (54.4 ttl pk-yrs)    Types: Cigarettes    Start date: 1970   Smokeless tobacco: Never   Tobacco comments:    Patient states he's down to smoking 3 cigarettes a day as of 02/22/2024  Vaping Use   Vaping status: Never Used  Substance and Sexual Activity   Alcohol use: Yes    Alcohol/week: 14.0 standard drinks of alcohol    Types: 14 Shots of liquor per week   Drug use: Not Currently   Sexual activity: Not on file  Other Topics Concern   Not on file  Social History Narrative   Retired Warehouse manager in the  Engineer, technical sales of [employer]   He has been married for 35 years    One child (son)       He likes to [employer] - plays once a week    2 glasses of wine a day max, still smoking 2 cups of coffee a day no drug use or other tobacco   Social Drivers of Corporate investment banker Strain: Patient Declined (04/26/2024)   Overall Financial Resource Strain (CARDIA)    Difficulty of Paying Living Expenses: Patient declined  Food Insecurity: Patient Declined (04/26/2024)   Hunger Vital Sign    Worried About Running Out of Food in the Last Year: Patient declined    Ran Out of Food in the Last Year: Patient declined  Transportation Needs: No Transportation Needs (05/27/2023)   PRAPARE - Scientist, research (physical sciences) (Medical): No    Lack of Transportation (Non-Medical): No  Physical Activity: Unknown (04/26/2024)   Exercise Vital Sign    Days of Exercise per Week: 1 day    Minutes of Exercise per Session: Patient declined  Stress: Patient Declined (04/26/2024)   [employer] of Occupational Health - Occupational Stress Questionnaire    Feeling of Stress: Patient declined  Social Connections: Unknown (04/26/2024)   Social Connection and Isolation Panel    Frequency of Communication with Friends and Family: Patient declined    Frequency of Social Gatherings with Friends and Family: Patient declined    Attends Religious Services: Patient declined    Database administrator or Organizations: Patient declined    Attends Banker Meetings: Not on file    Marital Status: Patient declined  Intimate Partner Violence: Not At Risk (05/27/2023)   Humiliation, Afraid, Rape, and Kick questionnaire    Fear of Current or Ex-Partner: No    Emotionally Abused: No    Physically Abused: No    Sexually Abused: No    Past Surgical History:  Procedure Laterality Date   ABDOMINAL AORTIC ENDOVASCULAR STENT GRAFT Bilateral 04/25/2023   Procedure: ABDOMINAL AORTIC ENDOVASCULAR STENT GRAFT WITH RIGHT ILIAC BRANCH ENDOPROSTHESIS;  Surgeon: Gretta Lonni PARAS, MD;  Location: [facility] OR;  Service: Vascular;  Laterality: Bilateral;   ABDOMINAL SURGERY  04/25/2023   COLONOSCOPY     PERCUTANEOUS CORONARY STENT INTERVENTION (PCI-S)     TONSILLECTOMY     removed as a child, also removed uvula   ULTRASOUND GUIDANCE FOR VASCULAR ACCESS Bilateral 04/25/2023   Procedure: ULTRASOUND GUIDANCE FOR VASCULAR ACCESS;  Surgeon: Gretta Lonni PARAS, MD;  Location: [facility] OR;  Service: Vascular;  Laterality: Bilateral;   UPPER GASTROINTESTINAL ENDOSCOPY      Family History  Problem Relation Age of Onset   Dementia Mother 35       died   Alcohol abuse Father 58   Depression Brother    Healthy Son    Colon cancer  Neg Hx    Esophageal cancer Neg Hx    Rectal cancer Neg Hx    Stomach cancer Neg Hx     Allergies  Allergen Reactions   Lisinopril  Swelling    Angio edema    Metformin  And Related Diarrhea   Norvasc  [Amlodipine ] Swelling    Lower extremity    Other Other (See Comments)    Current Outpatient Medications on File Prior to Visit  Medication Sig Dispense Refill   albuterol  (PROAIR  HFA) 108 (90 Base) MCG/ACT inhaler Inhale 2 puffs into the lungs every 6 (six) hours as needed. 1  Inhaler 3   aspirin  81 MG tablet Take 81 mg by mouth daily.     betamethasone  dipropionate 0.05 % cream Apply 1 application  topically as needed (for eczema).     carvedilol  (COREG ) 3.125 MG tablet Take 1 tablet (3.125 mg total) by mouth 2 (two) times daily with a meal. 180 tablet 3   chlorthalidone  (HYGROTON ) 25 MG tablet Take 1 tablet (25 mg total) by mouth daily. 90 tablet 3   clobetasol  ointment (TEMOVATE ) 0.05 % Apply 1 application  topically 2 (two) times daily as needed (rash).     diclofenac Sodium (VOLTAREN) 1 % GEL Apply 1 Application topically 4 (four) times daily as needed (pain).     doxazosin  (CARDURA ) 2 MG tablet TAKE 1 TABLET BY MOUTH DAILY 90 tablet 3   Evolocumab  (REPATHA  SURECLICK) 140 MG/ML SOAJ Inject 140 mg into the skin every 14 (fourteen) days. 2 mL 11   fluocinonide  (LIDEX ) 0.05 % external solution Apply 1 Application topically 2 (two) times daily as needed (scalp irritation).     FLUoxetine  (PROZAC ) 20 MG capsule Take 1 capsule (20 mg total) by mouth daily. 90 capsule 2   fluticasone  (FLONASE ) 50 MCG/ACT nasal spray Place 2 sprays into both nostrils daily. 18.2 mL 2   hydrOXYzine  (VISTARIL ) 25 MG capsule Take 1 capsule (25 mg total) by mouth 3 (three) times daily as needed. 30 capsule 4   latanoprost  (XALATAN ) 0.005 % ophthalmic solution Place 1 drop into both eyes at bedtime.     meloxicam  (MOBIC ) 7.5 MG tablet Take 1 tablet (7.5 mg total) by mouth daily. 90 tablet 3   Multiple Vitamin  (MULTIVITAMIN) tablet Take 1 tablet by mouth daily.     nitroGLYCERIN  (NITROSTAT ) 0.4 MG SL tablet Place 1 tablet (0.4 mg total) under the tongue every 5 (five) minutes as needed for chest pain. 25 tablet 11   pantoprazole  (PROTONIX ) 40 MG tablet TAKE 1 TABLET BY MOUTH DAILY  BEFORE BREAKFAST 90 tablet 3   rosuvastatin  (CRESTOR ) 40 MG tablet TAKE 1 TABLET BY MOUTH DAILY 90 tablet 3   timolol  (BETIMOL ) 0.5 % ophthalmic solution Place 1 drop into both eyes daily.     oxymetazoline  (AFRIN) 0.05 % nasal spray Place 1 spray into both nostrils 2 (two) times daily as needed for congestion.     No current facility-administered medications on file prior to visit.    BP 128/70   Pulse 63   Temp 98.3 F (36.8 C) (Oral)   Ht 5' 6 (1.676 m)   Wt 147 lb (66.7 kg)   SpO2 97%   BMI 23.73 kg/m       Objective:   Physical Exam Constitutional:      Appearance: Normal appearance.  Cardiovascular:     Rate and Rhythm: Normal rate and regular rhythm.     Pulses: Normal pulses.     Heart sounds: Normal heart sounds.  Pulmonary:     Effort: Pulmonary effort is normal.     Breath sounds: Normal breath sounds.  Musculoskeletal:        General: Normal range of motion.     Right shoulder: Tenderness present. No bony tenderness or crepitus. Normal range of motion. Normal strength.       Arms:     Cervical back: Tenderness present. No rigidity, torticollis or bony tenderness. Normal range of motion.       Back:     Comments: His discomfort with palpation along the trapezius and latissimus dorsi muscles.  Skin:    General: Skin is warm and dry.  Neurological:     General: No focal deficit present.     Mental Status: He is alert and oriented to person, place, and time.  Psychiatric:        Mood and Affect: Mood normal.        Behavior: Behavior normal.        Thought Content: Thought content normal.        Judgment: Judgment normal.        Assessment & Plan:  1. Muscle strain (Primary) -  This seems to be muscular in origin.  Will provide Flexeril .  He was encouraged to use a heating pad as well.  Follow-up if not resolved in the next week. - cyclobenzaprine  (FLEXERIL ) 10 MG tablet; Take 1 tablet (10 mg total) by mouth 3 (three) times daily as needed for muscle spasms.  Dispense: 30 tablet; Refill: 0  Darleene Shape, NP

## 2024-05-09 ENCOUNTER — Institutional Professional Consult (permissible substitution) (INDEPENDENT_AMBULATORY_CARE_PROVIDER_SITE_OTHER): Admitting: Otolaryngology

## 2024-05-10 ENCOUNTER — Ambulatory Visit: Admitting: Podiatry

## 2024-05-10 ENCOUNTER — Ambulatory Visit (HOSPITAL_COMMUNITY): Admitting: Licensed Clinical Social Worker

## 2024-05-10 ENCOUNTER — Encounter: Payer: Self-pay | Admitting: Podiatry

## 2024-05-10 DIAGNOSIS — M79676 Pain in unspecified toe(s): Secondary | ICD-10-CM

## 2024-05-10 DIAGNOSIS — B351 Tinea unguium: Secondary | ICD-10-CM

## 2024-05-10 NOTE — Progress Notes (Signed)
This patient returns to the office for evaluation and treatment of long thick painful nails .  This patient is unable to trim his own nails since the patient cannot reach the feet.  Patient says the nails are painful walking and wearing his shoes.  He returns for preventive foot care services.  General Appearance  Alert, conversant and in no acute stress.  Vascular  Dorsalis pedis and posterior tibial  pulses are palpable  bilaterally.  Capillary return is within normal limits  bilaterally. Temperature is within normal limits  bilaterally.  Neurologic  Senn-Weinstein monofilament wire test within normal limits  bilaterally. Muscle power within normal limits bilaterally.  Nails Thick disfigured discolored nails with subungual debris  from hallux to fifth toes bilaterally. No evidence of bacterial infection or drainage bilaterally.  Orthopedic  No limitations of motion  feet .  No crepitus or effusions noted.  No bony pathology or digital deformities noted.  Skin  normotropic skin with no porokeratosis noted bilaterally.  No signs of infections or ulcers noted.     Onychomycosis  Pain in toes right foot  Pain in toes left foot  Debridement  of nails  1-5  B/L with a nail nipper.  Nails were then filed using a dremel tool with no incidents.    RTC 3 months    Argie Lober DPM  

## 2024-05-16 ENCOUNTER — Ambulatory Visit (HOSPITAL_BASED_OUTPATIENT_CLINIC_OR_DEPARTMENT_OTHER): Admitting: Psychiatry

## 2024-05-16 ENCOUNTER — Encounter (HOSPITAL_COMMUNITY): Payer: Self-pay | Admitting: Psychiatry

## 2024-05-16 ENCOUNTER — Other Ambulatory Visit: Payer: Self-pay

## 2024-05-16 VITALS — BP 113/69 | HR 80 | Ht 66.0 in | Wt 150.0 lb

## 2024-05-16 DIAGNOSIS — F1020 Alcohol dependence, uncomplicated: Secondary | ICD-10-CM

## 2024-05-16 MED ORDER — HYDROXYZINE PAMOATE 25 MG PO CAPS: 25.0000 mg | ORAL_CAPSULE | Freq: Three times a day (TID) | ORAL | 4 refills | Status: AC | PRN

## 2024-05-16 MED ORDER — GABAPENTIN 300 MG PO CAPS: ORAL_CAPSULE | ORAL | 2 refills | Status: DC

## 2024-05-16 MED ORDER — FLUOXETINE HCL 20 MG PO CAPS: 20.0000 mg | ORAL_CAPSULE | Freq: Every day | ORAL | 2 refills | Status: AC

## 2024-05-16 NOTE — Progress Notes (Signed)
 Psychiatric Initial Adult Assessment   Patient Identification: Jeffrey Hudson MRN:  [id number] Date of Evaluation:  05/16/2024 Referral Source: Dr. Darell Chief Complaint:   Visit Diagnosis: Bipolar disorder  History of Present Illness:    Today the patient is seen alone.  Overall the patient is moving in the right direction.  He still drinks about 3 or 4 glasses of alcohol every day.  It is 2 glasses of wine and the rest of scotch.  His mood seems to be stable.  He says he is working on his relationship with his wife.  His wife is coming to the Center for her own treatment.  The patient himself is seeing our alcohol counselor in our setting.  Generally the patient's health is reasonably good.  It is noted that in July of last year he had abdominal aortic aneurysm repair.  The patient is having as he is paid off his house.  The patient is getting along better with his son.  They are going to go to the beach again this summer.  It is noted the patient denies daily depression.  He denies any symptoms consistent with mania.  He never has demonstrated any manic symptoms in the past.  He does have depression episodes and presently takes Prozac  20 mg.  The patient is trying to stay active.  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  The patient actually applied to [employer] but financially could not afford it.  The patient denies any psychotic symptoms.  He denies the use of any illicit drugs.   (Hypo) Manic Symptoms:   Anxiety Symptoms:   Psychotic Symptoms:   PTSD Symptoms:   Past Psychiatric History: Zyprexa  and Prozac , has been psychotherapy in the past including cognitive behavioral therapy.  Previous Psychotropic Medications: Zyprexa  and Prozac   Substance Abuse History in the last 12 months:    Consequences of Substance Abuse:   Past Medical History:  Past Medical History:  Diagnosis Date   AAA (abdominal aortic aneurysm) (HCC)    AMI (acute myocardial  infarction) (HCC)    Acute ST elevation   Anxiety    Arthritis    neck   Bipolar disorder (HCC)    COPD (chronic obstructive pulmonary disease) (HCC)    Coronary artery disease    a. s/p anterolateral STEMI with BMS placed in large diagonal branch (2009).   Depression    Emphysema of lung (HCC)    GERD (gastroesophageal reflux disease)    Glaucoma    History of kidney stones    Hx of adenomatous polyp of colon 05/06/2021   diminutive - no f/u needed   Hyperlipidemia    Hypertension    Nodule of left lung    6-mm smooothly rounded nodule   Plantar fasciitis    left foot   Pre-diabetes    Syncope and collapse    in 2001 and 2004 with no clear cause   Tobacco abuse    Viral URI with cough 11/22/2016    Past Surgical History:  Procedure Laterality Date   ABDOMINAL AORTIC ENDOVASCULAR STENT GRAFT Bilateral 04/25/2023   Procedure: ABDOMINAL AORTIC ENDOVASCULAR STENT GRAFT WITH RIGHT ILIAC BRANCH ENDOPROSTHESIS;  Surgeon: Gretta Lonni PARAS, MD;  Location: [facility];  Service: Vascular;  Laterality: Bilateral;   ABDOMINAL SURGERY  04/25/2023   COLONOSCOPY     PERCUTANEOUS CORONARY STENT INTERVENTION (PCI-S)     TONSILLECTOMY     removed as a child, also removed uvula   ULTRASOUND GUIDANCE  FOR VASCULAR ACCESS Bilateral 04/25/2023   Procedure: ULTRASOUND GUIDANCE FOR VASCULAR ACCESS;  Surgeon: Gretta Lonni PARAS, MD;  Location: [facility] OR;  Service: Vascular;  Laterality: Bilateral;   UPPER GASTROINTESTINAL ENDOSCOPY      Family Psychiatric History:   Family History:  Family History  Problem Relation Age of Onset   Dementia Mother 41       died   Alcohol abuse Father 11   Depression Brother    Healthy Son    Colon cancer Neg Hx    Esophageal cancer Neg Hx    Rectal cancer Neg Hx    Stomach cancer Neg Hx     Social History:   Social History   Socioeconomic History   Marital status: Married    Spouse name: Not on file   Number of children: 1   Years of education: Not  on file   Highest education level: Bachelor's degree (e.g., BA, AB, BS)  Occupational History   Occupation: Retired-Sales/broker  Tobacco Use   Smoking status: Every Day    Current packs/day: 0.25    Average packs/day: 1 pack/day for 55.6 years (54.4 ttl pk-yrs)    Types: Cigarettes    Start date: 1970   Smokeless tobacco: Never   Tobacco comments:    Patient states he's down to smoking 3 cigarettes a day as of 02/22/2024  Vaping Use   Vaping status: Never Used  Substance and Sexual Activity   Alcohol use: Yes    Alcohol/week: 14.0 standard drinks of alcohol    Types: 14 Shots of liquor per week   Drug use: Not Currently   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[place]  [employer]   He has been married for 35 years    One child (son)       He likes to [employer] - plays once a week    2 glasses of wine a day max, still smoking 2 cups of coffee a day no drug use or other tobacco   Social Drivers of Corporate investment banker Strain: Patient Declined (04/26/2024)   Overall Financial Resource Strain (CARDIA)    Difficulty of Paying Living Expenses: Patient declined  Food Insecurity: Patient Declined (04/26/2024)   Hunger Vital Sign    Worried About Running Out of Food in the Last Year: Patient declined    Ran Out of Food in the Last Year: Patient declined  Transportation Needs: No Transportation Needs (05/27/2023)   PRAPARE - Administrator, Civil Service (Medical): No    Lack of Transportation (Non-Medical): No  Physical Activity: Unknown (04/26/2024)   Exercise Vital Sign    Days of Exercise per Week: 1 day    Minutes of Exercise per Session: Patient declined  Stress: Patient Declined (04/26/2024)   [employer] of Occupational Health - Occupational Stress Questionnaire    Feeling of Stress: Patient declined  Social Connections: Unknown  (04/26/2024)   Social Connection and Isolation Panel    Frequency of Communication with Friends and Family: Patient declined    Frequency of Social Gatherings with Friends and Family: Patient declined    Attends Religious Services: Patient declined    Database administrator or Organizations: Patient declined    Attends Banker Meetings: Not on file    Marital Status: Patient declined    Additional Social History:  Allergies:   Allergies  Allergen Reactions   Lisinopril  Swelling    Angio edema    Metformin  And Related Diarrhea   Norvasc  [Amlodipine ] Swelling    Lower extremity    Other Other (See Comments)    Metabolic Disorder Labs: Lab Results  Component Value Date   HGBA1C 5.9 07/28/2021   MPG 131 06/24/2009   No results found for: PROLACTIN Lab Results  Component Value Date   CHOL 102 09/30/2023   TRIG 41 09/30/2023   HDL 84 09/30/2023   CHOLHDL 1.2 09/30/2023   VLDL 67.9 01/15/2021   LDLCALC 7 09/30/2023   LDLCALC 72 03/15/2023   Lab Results  Component Value Date   TSH 3.40 01/15/2021    Therapeutic Level Labs: No results found for: LITHIUM No results found for: CBMZ No results found for: VALPROATE  Current Medications: Current Outpatient Medications  Medication Sig Dispense Refill   albuterol  (PROAIR  HFA) 108 (90 Base) MCG/ACT inhaler Inhale 2 puffs into the lungs every 6 (six) hours as needed. 1 Inhaler 3   aspirin  81 MG tablet Take 81 mg by mouth daily.     betamethasone  dipropionate 0.05 % cream Apply 1 application  topically as needed (for eczema).     carvedilol  (COREG ) 3.125 MG tablet Take 1 tablet (3.125 mg total) by mouth 2 (two) times daily with a meal. 180 tablet 3   chlorthalidone  (HYGROTON ) 25 MG tablet Take 1 tablet (25 mg total) by mouth daily. 90 tablet 3   clobetasol  ointment (TEMOVATE ) 0.05 % Apply 1 application  topically 2 (two) times daily as needed (rash).     cyclobenzaprine  (FLEXERIL ) 10 MG tablet Take 1  tablet (10 mg total) by mouth 3 (three) times daily as needed for muscle spasms. 30 tablet 0   diclofenac Sodium (VOLTAREN) 1 % GEL Apply 1 Application topically 4 (four) times daily as needed (pain).     doxazosin  (CARDURA ) 2 MG tablet TAKE 1 TABLET BY MOUTH DAILY 90 tablet 3   Evolocumab  (REPATHA  SURECLICK) 140 MG/ML SOAJ Inject 140 mg into the skin every 14 (fourteen) days. 2 mL 11   fluocinonide  (LIDEX ) 0.05 % external solution Apply 1 Application topically 2 (two) times daily as needed (scalp irritation).     fluticasone  (FLONASE ) 50 MCG/ACT nasal spray Place 2 sprays into both nostrils daily. 18.2 mL 2   gabapentin  (NEURONTIN ) 300 MG capsule 1 bid 60 capsule 2   latanoprost  (XALATAN ) 0.005 % ophthalmic solution Place 1 drop into both eyes at bedtime.     meloxicam  (MOBIC ) 7.5 MG tablet Take 1 tablet (7.5 mg total) by mouth daily. 90 tablet 3   Multiple Vitamin (MULTIVITAMIN) tablet Take 1 tablet by mouth daily.     nitroGLYCERIN  (NITROSTAT ) 0.4 MG SL tablet Place 1 tablet (0.4 mg total) under the tongue every 5 (five) minutes as needed for chest pain. 25 tablet 11   pantoprazole  (PROTONIX ) 40 MG tablet TAKE 1 TABLET BY MOUTH DAILY  BEFORE BREAKFAST 90 tablet 3   rosuvastatin  (CRESTOR ) 40 MG tablet TAKE 1 TABLET BY MOUTH DAILY 90 tablet 3   timolol  (BETIMOL ) 0.5 % ophthalmic solution Place 1 drop into both eyes daily.     FLUoxetine  (PROZAC ) 20 MG capsule Take 1 capsule (20 mg total) by mouth daily. 90 capsule 2   hydrOXYzine  (VISTARIL ) 25 MG capsule Take 1 capsule (25 mg total) by mouth 3 (three) times daily as needed. 30 capsule 4   No current facility-administered medications for this visit.  Musculoskeletal: Strength & Muscle Tone: within normal limits Gait & Station: normal Patient leans: N/A  Psychiatric Specialty Exam: ROS  Blood pressure 113/69, pulse 80, height 5' 6 (1.676 m), weight 150 lb (68 kg).Body mass index is 24.21 kg/m.  General Appearance: Casual  Eye Contact:   Good  Speech:  Clear and Coherent  Volume:  Normal  Mood:  Negative  Affect:  Appropriate  Thought Process:  Goal Directed  Orientation:  Full (Time, Place, and Person)  Thought Content:  WDL  Suicidal Thoughts:  No  Homicidal Thoughts:  No  Memory:  NA  Judgement:  Good  Insight:  NA and Good  Psychomotor Activity:  Normal  Concentration:    Recall:  Good  Fund of Knowledge:Good  Language: Good  Akathisia:  No  Handed:  Right  AIMS (if indicated):  not done  Assets:  Desire for Improvement  ADL's:  Intact  Cognition: WNL  Sleep:  Good   Screenings: AUDIT    Flowsheet Row Clinical Support from 05/27/2023 in [facility] [place] Office Visit from 12/04/2021 in [facility] [place]  Alcohol Use Disorder Identification Test Final Score (AUDIT) 4 4   GAD-7    Flowsheet Row Counselor from 03/22/2024 in [facility] [place] Counselor from 11/29/2023 in [facility] at [facility]  Total GAD-7 Score 11 5   PHQ2-9    Flowsheet Row Counselor from 03/22/2024 in [facility] at [place] Counselor from 11/29/2023 in [facility] at [facility] Visit from 08/23/2023 in [facility] [place] Clinical Support from 05/27/2023 in [facility] [place] Office Visit from 03/08/2023 in [facility] [facility] [place]  PHQ-2 Total Score 3 1 2  0 0  PHQ-9 Total Score 12 2 2  -- 0   Flowsheet Row Counselor from 11/29/2023 in [place] [facility] at [facility] Pre-Admission Testing 58 from 04/20/2023 in [facility] PREADMISSION TESTING  C-SSRS RISK CATEGORY No Risk No Risk    Assessment and Plan:   7/30/20252:01 PM    The patient's diagnosis is substance use disorder alcohol.  At this time we will begin him on gabapentin  taking 3 mg twice  daily as a starting dose.  Eventually we will attempt to get him up to 1800 mg.  There is marginal evidence that this reduces alcohol intake.  Naltrexone will not be quite the intervention at this time.  The biggest intervention is the patient continues in his therapy and considers the possibility of going to AA.  His second problem is major depression.  Patient will continue taking Prozac  20 mg for this.  His third problem is insomnia and he takes hydroxyzine  for that.  Overall he is moving in the correct direction working on his alcohol dependency.

## 2024-05-17 ENCOUNTER — Ambulatory Visit (INDEPENDENT_AMBULATORY_CARE_PROVIDER_SITE_OTHER): Admitting: Licensed Clinical Social Worker

## 2024-05-17 DIAGNOSIS — F3132 Bipolar disorder, current episode depressed, moderate: Secondary | ICD-10-CM

## 2024-05-17 DIAGNOSIS — F4310 Post-traumatic stress disorder, unspecified: Secondary | ICD-10-CM

## 2024-05-17 DIAGNOSIS — F1090 Alcohol use, unspecified, uncomplicated: Secondary | ICD-10-CM | POA: Diagnosis not present

## 2024-05-17 DIAGNOSIS — F319 Bipolar disorder, unspecified: Secondary | ICD-10-CM | POA: Diagnosis not present

## 2024-05-17 DIAGNOSIS — Z720 Tobacco use: Secondary | ICD-10-CM

## 2024-05-17 DIAGNOSIS — F1020 Alcohol dependence, uncomplicated: Secondary | ICD-10-CM

## 2024-05-17 NOTE — Progress Notes (Signed)
 THERAPIST PROGRESS NOTE  Session Time: 8:57 a.m. to 10:59 a.m.   Type of Therapy: Individual   Therapist Response/Interventions: Solution Focused and CBT/the therapist explains to Jeffrey Hudson that he is effectively binge drinking on a daily basis and discusses the negative impact of his drinking on his symptoms of depression and anxiety. Additionally, the therapist educates him about MAT options to address alcohol cravings and addresses his apparent belief that he would be unable to stop drinking completely.   The therapist talks to [facility] about NA and even SA IOP if he were to finally determine his goal to be cessation of alcohol.   Treatment Goals addressed:  Active     Substance Use      Jeffrey will report a reduction in his symptoms of posttraumatic stress disorder as measured by the PCL-5 and by his PHQ-9 and GAD-7 both being a 4 or less. (Not Progressing)     Start:  03/22/24    Expected End:  09/21/24         Jeffrey will continue to reduce his alcohol consumption down from 1-1-1/2 standard drinks per day and additionally will decrease his tobacco use from his current level of 3 to 4 cigarettes/day. (Not Progressing)     Start:  03/22/24    Expected End:  09/21/24         The therapist will assist Jeffrey Hudson in being able to identify and change thoughts and behaviors that contribute to his depression and anxiety.     Start:  03/22/24         The therapist will assist Jeffrey Hudson in being able to identify and avoid triggers for using both alcohol and tobacco.     Start:  03/22/24                  Summary: Jeffrey presents saying that he was prescribed Neurotin by Dr. Tasia and that he is not happy with it and not taking it. The therapist obtains clarification regarding how much alcohol Jeffrey is drinking daily and it is about 6 ounces of scotch and two glasses of wine which is about six standard drinks.He is surprised to learn that he is drinking the equivalent of a six pack of beer per day.  He  says that he is a new grandfather and goes to the beach August 9th through the 16th saying that this will curtail his drinking as he will be with family. He says that he will be getting his annual letter soon to register as a sex offender which causes him anxiety. He looked into [facility] saying that he is not sure he wants someone on his tail.   The major focus of the session involves Jeffrey Hudson's drinking in relation to his goal of improving his mood and making it to his 50th anniversary of marriage. Jeffrey Hudson's affect becomes tearful at times. The therapist counters Jeffrey Hudson's contention that he is not having issues with anxiety per his elevated GAD-7 score.    Progress Towards Goals: Not progressing  Suicidal/Homicidal: No SI or HI  Plan: Jeffrey says that he would like to think over what was discussed today and call to reschedule after his upcoming trip, etcetera.  Diagnosis: PTSD, unspecified affect of disorder (rule out major depression,) alcohol use disorder severe, and tobacco use disorder; Bipolar Disorder  Collaboration of Care: Other N/A  Patient/Guardian was advised Release of Information must be obtained prior to any record release in order to collaborate their care with an outside provider. Patient/Guardian  was advised if they have not already done so to contact the registration department to sign all necessary forms in order for us  to release information regarding their care.   Consent: Patient/Guardian gives verbal consent for treatment and assignment of benefits for services provided during this visit. Patient/Guardian expressed understanding and agreed to proceed.   Jeffrey Maier, MA, LCSW, [facility], LCAS 05/17/2024

## 2024-05-22 ENCOUNTER — Other Ambulatory Visit: Payer: Self-pay | Admitting: Nurse Practitioner

## 2024-05-22 DIAGNOSIS — J310 Chronic rhinitis: Secondary | ICD-10-CM

## 2024-05-30 ENCOUNTER — Other Ambulatory Visit: Payer: Self-pay | Admitting: Cardiovascular Disease

## 2024-05-30 ENCOUNTER — Telehealth: Payer: Self-pay | Admitting: Adult Health

## 2024-05-30 DIAGNOSIS — I2510 Atherosclerotic heart disease of native coronary artery without angina pectoris: Secondary | ICD-10-CM

## 2024-05-30 DIAGNOSIS — M1990 Unspecified osteoarthritis, unspecified site: Secondary | ICD-10-CM

## 2024-05-30 DIAGNOSIS — E7800 Pure hypercholesterolemia, unspecified: Secondary | ICD-10-CM

## 2024-05-30 NOTE — Telephone Encounter (Signed)
**Note De-identified  [place] Obfuscation** Please advise 

## 2024-05-30 NOTE — Telephone Encounter (Unsigned)
 Copied from CRM [id number]. Topic: Referral - Question >> May 30, 2024  1:44 PM Gennette ORN wrote: Reason for CRM: Patient is calling and asking for a referral for arthritis to be seen at [facility] [phone]

## 2024-05-30 NOTE — Telephone Encounter (Signed)
 Patient notified of update  and verbalized understanding. Referral placed.

## 2024-06-01 ENCOUNTER — Telehealth (HOSPITAL_COMMUNITY): Payer: Self-pay | Admitting: Licensed Clinical Social Worker

## 2024-06-01 NOTE — Telephone Encounter (Signed)
 Bill's wife leaves a voicemail for this therapist saying that she wants to pass on some information and does not necessarily expect a call back as she understands that the therapist would likely not be able to do so.  She says that they went to the beach and that Zell had a big manic episode in which he started drinking and cursing at family members alienating himself from them.  She says that Zell wants to go to [facility] but does not want to do their inpatient program.  His wife says that she hopes that Zell will call this therapist.  Zell Maier, MA, LCSW, [facility], LCAS 06/01/2024

## 2024-06-05 NOTE — Telephone Encounter (Signed)
 Pt called back and stated that [facility] does not have a referral for him. I checked under the referrals tab and I didn't see one either. Please advise.

## 2024-06-05 NOTE — Telephone Encounter (Signed)
 The referral was still pending accidentally. Called pt to advise of update but no answer. Referral is placed and signed.

## 2024-06-05 NOTE — Progress Notes (Signed)
 [facility] 06/05/24 No primary care provider on file. Treatment Providers Sompop Bencharit, DDS  Dental procedures in this visit  . [id number] - IMPLANT CONSULT    HEALTH HISTORY ? Vitals:  BP Readings from Last 1 Encounters:  06/05/24 (!) 150/82    Pulse:  71 Medical history was reviewed and updated. No contraindication to care. Medical History[1] Surgical History[2] Social History   Tobacco Use  . Smoking status: Every Day    Types: Cigarettes  . Smokeless tobacco: Not on file  Substance Use Topics  . Alcohol use: Yes   Family History[3] Medications Ordered Prior to Encounter[4] Medical Risk Assessment ASA GRADE: ASA 2 - Patient with mild systemic disease with no functional limitations  Subjective: Patient reports he has been wearing an upper denture for years.  He had another made 2-3 years ago, but does not like it.  Pt has MD teeth, but they are non restorable and need to be extracted.  Pt is considering implants and overdentures.  Exam reveals MX arch is edentulous, MD arch has #23,24,26,27,28 however they are all periodontally compromised.   Objective:  Radiographs taken: No radiographs captured at this appointment Dr. Fernand did pts comp exam and radiographs were taken at that appointment.    Assessment: Pt has MX CD and is interested in MX/MD OVD. iTero scanned today.   Plan: TX plan: CBCT and Scan for sx guide and interims Ext, BG, alveolplasty on LR, Del interims Place 2 MD implants, and 2-4 MX implants  MX/MD OVD  Additional notes: We will send a pre tx to his insurance and have pt return  NV: Dr. appointment: CBCT and scan denture  Treatment Providers Sompop Bencharit, DDS Merlynn Richters, DDS [facility] [address] [place] [id number]        [1] Past Medical History: Diagnosis Date  . Bipolar disorder   . Cardiovascular disease   . History of carotid  artery stenosis   . History of heart attack   . Hypertension   . Lung disease   [2] History reviewed. No pertinent surgical history. [3] No family history on file. [4] Current Outpatient Medications on File Prior to Visit  Medication Sig Dispense Refill  . cyclobenzaprine  (FLEXERIL ) 10 mg tablet Take 10 mg by mouth.    . doxazosin  (CARDURA ) 2 mg tablet Take 2 mg by mouth 1 (one) time each day.    . gabapentin  (NEURONTIN ) 300 mg capsule in the morning and at bedtime.    . latanoprost  (XALATAN ) 0.005 % ophthalmic solution INSTILL 1 DROP INTO BOTH EYES EVERY EVENING AS DIRECTED    . rosuvastatin  (CRESTOR ) 40 mg tablet Take 40 mg by mouth 1 (one) time each day.    . aspirin  81 mg tablet Take 81 mg by mouth in the morning.    . aspirin  81 mg tablet Take 81 mg by mouth.    . carvediloL  (COREG ) 3.125 mg tablet Take 3.125 mg by mouth.    . celecoxib (CeleBREX) 200 mg capsule Take 200 mg by mouth 1 (one) time each day.    . chlorthalidone  (HYGROTON ) 25 mg tablet Take 25 mg by mouth.    . diclofenac (VOLTAREN) 1 % topical gel Apply 1 Application topically.    . FLUoxetine  (PROzac ) 20 mg capsule Take 20 mg by mouth.    . fluticasone  propionate (FLONASE ) 50 mcg/actuation nasal spray Administer 2 sprays into each nostril in the morning.    SABRA  hydrOXYzine  pamoate (VISTARIL ) 25 mg capsule Take 25 mg by mouth.    . meloxicam  (MOBIC ) 7.5 mg tablet Take 7.5 mg by mouth.    . metoprolol  succinate (TOPROL -XL) 25 mg 24 hr tablet Take 25 mg by mouth 1 (one) time each day.    . multivitamin (THERAGRAN) tablet Take 1 tablet by mouth 1 (one) time each day.    . multivitamin complete formula with D3000 (COMPLETE D3000) 3,000-1,000 unit-mcg chewable tablet Chew 1 tablet 1 (one) time each day.    . multivitamin-Ca-iron-minerals tablet Take 1 tablet by mouth 1 (one) time each day.    . nitroglycerin  (NITROSTAT ) 0.4 mg SL tablet Place 0.4 mg under the tongue 1 (one) time each day if needed.    . oxymetazoline  (No  Drip) 0.05 % nasal spray Administer 1 spray into affected nostril(s).    . pantoprazole  (PROTONIX ) 40 mg EC tablet Take 40 mg by mouth.    . Repatha  SureClick 140 mg/mL pen injector Inject 140 mg under the skin.    . timoloL  (BETIMOL ) 0.25 % ophthalmic solution Administer 1-2 drops into affected eye(s) 1 (one) time each day.    . timoloL  (BETIMOL ) 0.5 % ophthalmic solution Administer 1 drop into affected eye(s).    . timolol  (TIMOPTIC ) 0.5 % ophthalmic solution See Admin Instructions. PLEASE SEE ATTACHED FOR DETAILED DIRECTIONS     No current facility-administered medications on file prior to visit.

## 2024-06-07 ENCOUNTER — Telehealth (HOSPITAL_COMMUNITY): Payer: Self-pay | Admitting: Licensed Clinical Social Worker

## 2024-06-07 NOTE — Telephone Encounter (Signed)
 The therapist attempts to reach Jeffrey Hudson in response to a message he left on the nurse call line inquiring about bringing his wife in for marital counseling.  The therapist leaves a HIPAA compliant voicemail for Jeffrey Hudson with his direct callback number.  Zell Maier, MA, LCSW, [facility], LCAS

## 2024-06-08 ENCOUNTER — Telehealth: Payer: Self-pay | Admitting: Vascular Surgery

## 2024-06-12 NOTE — Telephone Encounter (Signed)
Appt has been r/s

## 2024-07-04 DIAGNOSIS — M720 Palmar fascial fibromatosis [Dupuytren]: Secondary | ICD-10-CM | POA: Diagnosis not present

## 2024-07-04 DIAGNOSIS — M180 Bilateral primary osteoarthritis of first carpometacarpal joints: Secondary | ICD-10-CM | POA: Diagnosis not present

## 2024-07-04 DIAGNOSIS — M19041 Primary osteoarthritis, right hand: Secondary | ICD-10-CM | POA: Diagnosis not present

## 2024-07-04 DIAGNOSIS — M19042 Primary osteoarthritis, left hand: Secondary | ICD-10-CM | POA: Diagnosis not present

## 2024-07-17 ENCOUNTER — Ambulatory Visit (HOSPITAL_COMMUNITY): Admitting: Licensed Clinical Social Worker

## 2024-07-17 ENCOUNTER — Telehealth (HOSPITAL_COMMUNITY): Payer: Self-pay | Admitting: Licensed Clinical Social Worker

## 2024-07-17 DIAGNOSIS — F319 Bipolar disorder, unspecified: Secondary | ICD-10-CM

## 2024-07-17 DIAGNOSIS — Z720 Tobacco use: Secondary | ICD-10-CM

## 2024-07-17 DIAGNOSIS — F4310 Post-traumatic stress disorder, unspecified: Secondary | ICD-10-CM

## 2024-07-17 DIAGNOSIS — F1020 Alcohol dependence, uncomplicated: Secondary | ICD-10-CM

## 2024-07-17 DIAGNOSIS — F3132 Bipolar disorder, current episode depressed, moderate: Secondary | ICD-10-CM
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
## 2024-07-17 NOTE — Telephone Encounter (Signed)
 The therapist receives a voicemail from [place] wife saying that she is concerned about Jeffrey who she says is in a manic state.  She says that he is not taking his Gabapentin  and drinking a lot.  Jeffrey Maier, MA, LCSW, [facility], LCAS 07/17/2024

## 2024-07-31 ENCOUNTER — Telehealth: Payer: Self-pay

## 2024-07-31 NOTE — Telephone Encounter (Signed)
 Copied from CRM [id number]. Topic: Appointments - Scheduling Inquiry for Clinic >> Jul 27, 2024  2:51 PM Jeffrey Hudson wrote: Reason for CRM: Patient is calling to schedule LCS. Patient states something the week of Oct 27th in the morning (prefers 9AM) at [facility] location would work best. Patient refused to be transferred to [facility].   Patient would like to be contacted via MyChart.

## 2024-08-01 ENCOUNTER — Telehealth (INDEPENDENT_AMBULATORY_CARE_PROVIDER_SITE_OTHER): Admitting: Adult Health

## 2024-08-01 VITALS — Ht 66.0 in | Wt 150.0 lb

## 2024-08-01 DIAGNOSIS — M25511 Pain in right shoulder: Secondary | ICD-10-CM | POA: Diagnosis not present

## 2024-08-01 DIAGNOSIS — R0789 Other chest pain: Secondary | ICD-10-CM | POA: Diagnosis not present

## 2024-08-01 NOTE — Progress Notes (Signed)
 Virtual Visit via Video Note  I connected with Jeffrey Hudson  on 08/01/24 at 10:30 AM EDT by a video enabled telemedicine application and verified that I am speaking with the correct person using two identifiers.  Location patient: home Location provider:work or home office Persons participating in the virtual visit: patient, provider  I discussed the limitations of evaluation and management by telemedicine and the availability of in person appointments. The patient expressed understanding and agreed to proceed.   HPI:  73 year old male who is being evaluated today for an issue that he was seen back in July 2025 for.  At that time he reported 6 to 8 weeks of right sided chest/shoulder/back pain.  His symptoms started after he went camping in the mountains and ended up sleeping on the ground for 2 nights.  The pain was radiating under his right breast into his right axilla and up around his right shoulder blade.  Pain.  With movement such as lifting his arm and reaching above his head.  He has been using meloxicam  without much relief.  It was thought that his symptoms were related to a muscle strain and prescribed a course of Flexeril .  Today he reports that he continues to have pain located along the upper chest wall on the right side radiates around his right shoulder into his trapezius.  Feels very tight in his shoulder and trapezius.  Continues to have pain with range of motion.  ROS: See pertinent positives and negatives per HPI.  Past Medical History:  Diagnosis Date   AAA (abdominal aortic aneurysm)    AMI (acute myocardial infarction) (HCC)    Acute ST elevation   Anxiety    Arthritis    neck   Bipolar disorder (HCC)    COPD (chronic obstructive pulmonary disease) (HCC)    Coronary artery disease    a. s/p anterolateral STEMI with BMS placed in large diagonal branch (2009).   Depression    Emphysema of lung (HCC)    GERD (gastroesophageal reflux disease)    Glaucoma     History of kidney stones    Hx of adenomatous polyp of colon 05/06/2021   diminutive - no f/u needed   Hyperlipidemia    Hypertension    Nodule of left lung    6-mm smooothly rounded nodule   Plantar fasciitis    left foot   Pre-diabetes    Syncope and collapse    in 2001 and 2004 with no clear cause   Tobacco abuse    Viral URI with cough 11/22/2016    Past Surgical History:  Procedure Laterality Date   ABDOMINAL AORTIC ENDOVASCULAR STENT GRAFT Bilateral 04/25/2023   Procedure: ABDOMINAL AORTIC ENDOVASCULAR STENT GRAFT WITH RIGHT ILIAC BRANCH ENDOPROSTHESIS;  Surgeon: Gretta Lonni PARAS, MD;  Location: [facility] OR;  Service: Vascular;  Laterality: Bilateral;   ABDOMINAL SURGERY  04/25/2023   COLONOSCOPY     PERCUTANEOUS CORONARY STENT INTERVENTION (PCI-S)     TONSILLECTOMY     removed as a child, also removed uvula   ULTRASOUND GUIDANCE FOR VASCULAR ACCESS Bilateral 04/25/2023   Procedure: ULTRASOUND GUIDANCE FOR VASCULAR ACCESS;  Surgeon: Gretta Lonni PARAS, MD;  [facility] OR;  Service: Vascular;  Laterality: Bilateral;   UPPER GASTROINTESTINAL ENDOSCOPY      Family History  Problem Relation Age of Onset   Dementia Mother 70       died   Alcohol abuse Father 63   Depression Brother    Healthy Son  Colon cancer Neg Hx    Esophageal cancer Neg Hx    Rectal cancer Neg Hx    Stomach cancer Neg Hx        Current Outpatient Medications:    albuterol  (PROAIR  HFA) 108 (90 Base) MCG/ACT inhaler, Inhale 2 puffs into the lungs every 6 (six) hours as needed., Disp: 1 Inhaler, Rfl: 3   aspirin  81 MG tablet, Take 81 mg by mouth daily., Disp: , Rfl:    betamethasone  dipropionate 0.05 % cream, Apply 1 application  topically as needed (for eczema)., Disp: , Rfl:    carvedilol  (COREG ) 3.125 MG tablet, Take 1 tablet (3.125 mg total) by mouth 2 (two) times daily with a meal., Disp: 180 tablet, Rfl: 3   chlorthalidone  (HYGROTON ) 25 MG tablet, Take 1 tablet (25 mg total) by mouth  daily., Disp: 90 tablet, Rfl: 3   clobetasol  ointment (TEMOVATE ) 0.05 %, Apply 1 application  topically 2 (two) times daily as needed (rash)., Disp: , Rfl:    cyclobenzaprine  (FLEXERIL ) 10 MG tablet, Take 1 tablet (10 mg total) by mouth 3 (three) times daily as needed for muscle spasms., Disp: 30 tablet, Rfl: 0   diclofenac Sodium (VOLTAREN) 1 % GEL, Apply 1 Application topically 4 (four) times daily as needed (pain)., Disp: , Rfl:    doxazosin  (CARDURA ) 2 MG tablet, TAKE 1 TABLET BY MOUTH DAILY, Disp: 90 tablet, Rfl: 3   Evolocumab  (REPATHA  SURECLICK) 140 MG/ML SOAJ, INJECT 1 PEN SUBCUTANEOUSLY  EVERY 2 WEEKS, Disp: 6 mL, Rfl: 0   fluocinonide  (LIDEX ) 0.05 % external solution, Apply 1 Application topically 2 (two) times daily as needed (scalp irritation)., Disp: , Rfl:    FLUoxetine  (PROZAC ) 20 MG capsule, Take 1 capsule (20 mg total) by mouth daily., Disp: 90 capsule, Rfl: 2   fluticasone  (FLONASE ) 50 MCG/ACT nasal spray, SPRAY 2 SPRAYS INTO EACH NOSTRIL EVERY DAY, Disp: 16 mL, Rfl: 2   gabapentin  (NEURONTIN ) 300 MG capsule, 1 bid, Disp: 60 capsule, Rfl: 2   hydrOXYzine  (VISTARIL ) 25 MG capsule, Take 1 capsule (25 mg total) by mouth 3 (three) times daily as needed., Disp: 30 capsule, Rfl: 4   latanoprost  (XALATAN ) 0.005 % ophthalmic solution, Place 1 drop into both eyes at bedtime., Disp: , Rfl:    meloxicam  (MOBIC ) 7.5 MG tablet, Take 1 tablet (7.5 mg total) by mouth daily., Disp: 90 tablet, Rfl: 3   Multiple Vitamin (MULTIVITAMIN) tablet, Take 1 tablet by mouth daily., Disp: , Rfl:    nitroGLYCERIN  (NITROSTAT ) 0.4 MG SL tablet, Place 1 tablet (0.4 mg total) under the tongue every 5 (five) minutes as needed for chest pain., Disp: 25 tablet, Rfl: 11   pantoprazole  (PROTONIX ) 40 MG tablet, TAKE 1 TABLET BY MOUTH DAILY  BEFORE BREAKFAST, Disp: 90 tablet, Rfl: 3   rosuvastatin  (CRESTOR ) 40 MG tablet, TAKE 1 TABLET BY MOUTH DAILY, Disp: 90 tablet, Rfl: 3   timolol  (BETIMOL ) 0.5 % ophthalmic solution,  Place 1 drop into both eyes daily., Disp: , Rfl:   EXAM:  VITALS per patient if applicable:  GENERAL: alert, oriented, appears well and in no acute distress  HEENT: atraumatic, conjunttiva clear, no obvious abnormalities on inspection of external nose and ears  NECK: normal movements of the head and neck  LUNGS: on inspection no signs of respiratory distress, breathing rate appears normal, no obvious gross SOB, gasping or wheezing  CV: no obvious cyanosis  MS: moves all visible extremities without noticeable abnormality  PSYCH/NEURO: pleasant and cooperative, no obvious depression  or anxiety, speech and thought processing grossly intact  ASSESSMENT AND PLAN:  Discussed the following assessment and plan:  1. Acute pain of right shoulder (Primary) - Shoulder x-ray to look at both shoulder joint, and right chest wall.  May need advanced imaging in the future.  I did discuss physical therapy with him but he refused at this time. - DG Shoulder Right; Future  2. Right-sided chest wall pain  - DG Shoulder Right; Future      I discussed the assessment and treatment plan with the patient. The patient was provided an opportunity to ask questions and all were answered. The patient agreed with the plan and demonstrated an understanding of the instructions.   The patient was advised to call back or seek an in-person evaluation if the symptoms worsen or if the condition fails to improve as anticipated.   Kalysta Kneisley, NP

## 2024-08-07 ENCOUNTER — Encounter: Payer: Self-pay | Admitting: Adult Health

## 2024-08-07 ENCOUNTER — Other Ambulatory Visit: Payer: Self-pay | Admitting: Physician Assistant

## 2024-08-07 DIAGNOSIS — F419 Anxiety disorder, unspecified: Secondary | ICD-10-CM

## 2024-08-08 ENCOUNTER — Ambulatory Visit (INDEPENDENT_AMBULATORY_CARE_PROVIDER_SITE_OTHER)
Admission: RE | Admit: 2024-08-08 | Discharge: 2024-08-08 | Disposition: A | Discharge status: Home / Self Care | Source: Ambulatory Visit | Attending: Adult Health | Admitting: Adult Health

## 2024-08-08 DIAGNOSIS — M25511 Pain in right shoulder: Secondary | ICD-10-CM | POA: Diagnosis not present

## 2024-08-08 DIAGNOSIS — M25711 Osteophyte, right shoulder: Secondary | ICD-10-CM | POA: Diagnosis not present

## 2024-08-08 DIAGNOSIS — R0789 Other chest pain: Secondary | ICD-10-CM | POA: Diagnosis not present

## 2024-08-08 DIAGNOSIS — M19041 Primary osteoarthritis, right hand: Secondary | ICD-10-CM | POA: Diagnosis not present

## 2024-08-08 DIAGNOSIS — M19042 Primary osteoarthritis, left hand: Secondary | ICD-10-CM | POA: Diagnosis not present

## 2024-08-08 DIAGNOSIS — M19011 Primary osteoarthritis, right shoulder: Secondary | ICD-10-CM | POA: Diagnosis not present

## 2024-08-08 NOTE — Telephone Encounter (Signed)
 Ok for referral?

## 2024-08-09 ENCOUNTER — Ambulatory Visit: Payer: Self-pay | Admitting: Adult Health

## 2024-08-21 ENCOUNTER — Encounter (HOSPITAL_COMMUNITY): Payer: Self-pay

## 2024-08-21 ENCOUNTER — Ambulatory Visit (HOSPITAL_COMMUNITY): Admitting: Psychiatry

## 2024-08-24 ENCOUNTER — Other Ambulatory Visit: Payer: Self-pay | Admitting: Nurse Practitioner

## 2024-08-24 DIAGNOSIS — J310 Chronic rhinitis: Secondary | ICD-10-CM

## 2024-09-04 ENCOUNTER — Ambulatory Visit: Admitting: Vascular Surgery

## 2024-09-04 ENCOUNTER — Ambulatory Visit (HOSPITAL_COMMUNITY)
Admission: RE | Admit: 2024-09-04 | Discharge: 2024-09-04 | Disposition: A | Discharge status: Home / Self Care | Source: Ambulatory Visit | Attending: Cardiology | Admitting: Cardiology

## 2024-09-04 DIAGNOSIS — I7143 Infrarenal abdominal aortic aneurysm, without rupture: Secondary | ICD-10-CM | POA: Diagnosis present

## 2024-09-07 ENCOUNTER — Other Ambulatory Visit: Payer: Self-pay | Admitting: Cardiovascular Disease

## 2024-09-07 DIAGNOSIS — I2510 Atherosclerotic heart disease of native coronary artery without angina pectoris: Secondary | ICD-10-CM

## 2024-09-07 DIAGNOSIS — E7800 Pure hypercholesterolemia, unspecified: Secondary | ICD-10-CM

## 2024-09-10 NOTE — Progress Notes (Unsigned)
 Patient name: Jeffrey Hudson MRN: [id number] DOB: [birth year] Sex: male  REASON FOR CONSULT: 6 month follow-up after EVAR and right iliac branch device  HPI: Jeffrey Hudson is a 73 y.o. male, with hx COPD, CAD s/p STEMI, HTN, HLD, tobacco abuse that presents for 6 month follow-up for ongoing surveillance after previous EVAR with right iliac branch endoprosthesis.  On 04/25/2023 he underwent EVAR plus right iliac branch endoprosthesis for a 3.8 cm AAA and 5.4 cm right common iliac aneurysm.   No complaints or concerns on follow-up today.  States he recently tried playing golf again.  Past Medical History:  Diagnosis Date   AAA (abdominal aortic aneurysm)    AMI (acute myocardial infarction) (HCC)    Acute ST elevation   Anxiety    Arthritis    neck   Bipolar disorder (HCC)    COPD (chronic obstructive pulmonary disease) (HCC)    Coronary artery disease    a. s/p anterolateral STEMI with BMS placed in large diagonal branch (2009).   Depression    Emphysema of lung (HCC)    GERD (gastroesophageal reflux disease)    Glaucoma    History of kidney stones    Hx of adenomatous polyp of colon 05/06/2021   diminutive - no f/u needed   Hyperlipidemia    Hypertension    Nodule of left lung    6-mm smooothly rounded nodule   Plantar fasciitis    left foot   Pre-diabetes    Syncope and collapse    in 2001 and 2004 with no clear cause   Tobacco abuse    Viral URI with cough 11/22/2016    Past Surgical History:  Procedure Laterality Date   ABDOMINAL AORTIC ENDOVASCULAR STENT GRAFT Bilateral 04/25/2023   Procedure: ABDOMINAL AORTIC ENDOVASCULAR STENT GRAFT WITH RIGHT ILIAC BRANCH ENDOPROSTHESIS;  Surgeon: Gretta Lonni PARAS, MD;  Location: [facility] OR;  Service: Vascular;  Laterality: Bilateral;   ABDOMINAL SURGERY  04/25/2023   COLONOSCOPY     PERCUTANEOUS CORONARY STENT INTERVENTION (PCI-S)     TONSILLECTOMY     removed as a child, also removed uvula   ULTRASOUND GUIDANCE FOR  VASCULAR ACCESS Bilateral 04/25/2023   Procedure: ULTRASOUND GUIDANCE FOR VASCULAR ACCESS;  Surgeon: Gretta Lonni PARAS, MD;  Location: [facility] OR;  Service: Vascular;  Laterality: Bilateral;   UPPER GASTROINTESTINAL ENDOSCOPY      Family History  Problem Relation Age of Onset   Dementia Mother 53       died   Alcohol abuse Father 39   Depression Brother    Healthy Son    Colon cancer Neg Hx    Esophageal cancer Neg Hx    Rectal cancer Neg Hx    Stomach cancer Neg Hx     SOCIAL HISTORY: Social History   Socioeconomic History   Marital status: Married    Spouse name: Not on file   Number of children: 1   Years of education: Not on file   Highest education level: Bachelor's degree (e.g., BA, AB, BS)  Occupational History   Occupation: Retired-Sales/broker  Tobacco Use   Smoking status: Every Day    Current packs/day: 0.25    Average packs/day: 1 pack/day for 55.9 years (54.5 ttl pk-yrs)    Types: Cigarettes    Start date: 1970   Smokeless tobacco: Never   Tobacco comments:    Patient states he's down to smoking 3 cigarettes a day as of 02/22/2024  Vaping Use   Vaping  status: Never Used  Substance and Sexual Activity   Alcohol use: Yes    Alcohol/week: 14.0 standard drinks of alcohol    Types: 14 Shots of liquor per week   Drug use: Not Currently   Sexual activity: Not on file  Other Topics Concern   Not on file  Social History Narrative   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 [employer]   He has been married for 35 years    One child (son)       He likes to [employer] - plays once a week    2 glasses of wine a day max, still smoking 2 cups of coffee a day no drug use or other tobacco   Social Drivers of Corporate Investment Banker Strain: Low Risk  (08/01/2024)   Overall Financial Resource Strain (CARDIA)    Difficulty of Paying Living Expenses: Not very hard  Food Insecurity: Unknown (08/01/2024)   Hunger Vital Sign    Worried  About Running Out of Food in the Last Year: Not on file    Ran Out of Food in the Last Year: Never true  Transportation Needs: No Transportation Needs (08/01/2024)   PRAPARE - Administrator, Civil Service (Medical): No    Lack of Transportation (Non-Medical): No  Physical Activity: Unknown (08/01/2024)   Exercise Vital Sign    Days of Exercise per Week: Patient declined    Minutes of Exercise per Session: Not on file  Stress: No Stress Concern Present (08/01/2024)   [employer] of Occupational Health - Occupational Stress Questionnaire    Feeling of Stress: Not at all  Social Connections: Unknown (08/01/2024)   Social Connection and Isolation Panel    Frequency of Communication with Friends and Family: Patient declined    Frequency of Social Gatherings with Friends and Family: Patient declined    Attends Religious Services: Patient declined    Database Administrator or Organizations: No    Attends Engineer, Structural: Not on file    Marital Status: Married  Catering Manager Violence: Not At Risk (05/27/2023)   Humiliation, Afraid, Rape, and Kick questionnaire    Fear of Current or Ex-Partner: No    Emotionally Abused: No    Physically Abused: No    Sexually Abused: No    Allergies  Allergen Reactions   Lisinopril  Swelling    Angio edema    Metformin  And Related Diarrhea   Norvasc  [Amlodipine ] Swelling    Lower extremity    Other Other (See Comments)    Current Outpatient Medications  Medication Sig Dispense Refill   albuterol  (PROAIR  HFA) 108 (90 Base) MCG/ACT inhaler Inhale 2 puffs into the lungs every 6 (six) hours as needed. 1 Inhaler 3   aspirin  81 MG tablet Take 81 mg by mouth daily.     betamethasone  dipropionate 0.05 % cream Apply 1 application  topically as needed (for eczema).     carvedilol  (COREG ) 3.125 MG tablet Take 1 tablet (3.125 mg total) by mouth 2 (two) times daily with a meal. 180 tablet 3   chlorthalidone  (HYGROTON ) 25 MG  tablet TAKE 1 TABLET BY MOUTH DAILY 90 tablet 0   clobetasol  ointment (TEMOVATE ) 0.05 % Apply 1 application  topically 2 (two) times daily as needed (rash).     cyclobenzaprine  (FLEXERIL ) 10 MG tablet Take 1 tablet (10 mg total) by mouth 3 (three) times daily as needed for muscle spasms. 30 tablet 0  diclofenac Sodium (VOLTAREN) 1 % GEL Apply 1 Application topically 4 (four) times daily as needed (pain).     doxazosin  (CARDURA ) 2 MG tablet TAKE 1 TABLET BY MOUTH DAILY 90 tablet 3   Evolocumab  (REPATHA  SURECLICK) 140 MG/ML SOAJ INJECT 1 PEN SUBCUTANEOUSLY  EVERY 2 WEEKS 6 mL 0   fluocinonide  (LIDEX ) 0.05 % external solution Apply 1 Application topically 2 (two) times daily as needed (scalp irritation).     FLUoxetine  (PROZAC ) 20 MG capsule Take 1 capsule (20 mg total) by mouth daily. 90 capsule 2   fluticasone  (FLONASE ) 50 MCG/ACT nasal spray SPRAY 2 SPRAYS INTO EACH NOSTRIL EVERY DAY 16 mL 2   gabapentin  (NEURONTIN ) 300 MG capsule 1 bid 60 capsule 2   hydrOXYzine  (VISTARIL ) 25 MG capsule Take 1 capsule (25 mg total) by mouth 3 (three) times daily as needed. 30 capsule 4   latanoprost  (XALATAN ) 0.005 % ophthalmic solution Place 1 drop into both eyes at bedtime.     meloxicam  (MOBIC ) 7.5 MG tablet Take 1 tablet (7.5 mg total) by mouth daily. 90 tablet 3   Multiple Vitamin (MULTIVITAMIN) tablet Take 1 tablet by mouth daily.     nitroGLYCERIN  (NITROSTAT ) 0.4 MG SL tablet Place 1 tablet (0.4 mg total) under the tongue every 5 (five) minutes as needed for chest pain. 25 tablet 11   pantoprazole  (PROTONIX ) 40 MG tablet TAKE 1 TABLET BY MOUTH DAILY  BEFORE BREAKFAST 90 tablet 3   rosuvastatin  (CRESTOR ) 40 MG tablet TAKE 1 TABLET BY MOUTH DAILY 90 tablet 3   timolol  (BETIMOL ) 0.5 % ophthalmic solution Place 1 drop into both eyes daily.     No current facility-administered medications for this visit.    REVIEW OF SYSTEMS:  [X]  denotes positive finding, [ ]  denotes negative finding Cardiac  Comments:   Chest pain or chest pressure:    Shortness of breath upon exertion:    Short of breath when lying flat:    Irregular heart rhythm:        Vascular    Pain in calf, thigh, or hip brought on by ambulation:    Pain in feet at night that wakes you up from your sleep:     Blood clot in your veins:    Leg swelling:         Pulmonary    Oxygen at home:    Productive cough:     Wheezing:         Neurologic    Sudden weakness in arms or legs:     Sudden numbness in arms or legs:     Sudden onset of difficulty speaking or slurred speech:    Temporary loss of vision in one eye:     Problems with dizziness:         Gastrointestinal    Blood in stool:     Vomited blood:         Genitourinary    Burning when urinating:     Blood in urine:        Psychiatric    Major depression:         Hematologic    Bleeding problems:    Problems with blood clotting too easily:        Skin    Rashes or ulcers:        Constitutional    Fever or chills:      PHYSICAL EXAM: There were no vitals filed for this visit.   GENERAL: The patient is  a well-nourished male, in no acute distress. The vital signs are documented above. CARDIAC: There is a regular rate and rhythm.  VASCULAR:  Bilateral femoral pulses palpable Bilateral PT pulses palpable ABDOMEN: Soft and non-tender.  No pain with palpation of aneurysm   DATA:   EVAR duplex 09/04/24 shows maximal aortic diameter 4.4 cm and maximal right iliac diameter 5.3 cm (previous aorta maximal aortic diameter 4.36 cm and right common iliac maximal diameter 5.3 cm immediately after repair by duplex)  CTA reviewed 05/24/2023 with both the infrarenal stent graft and right iliac branch endoprosthesis in good position.  All the limbs are widely patent.  Both internal iliac arteries are patent.  There is a delayed type II endoleak likely from a lumbar.  Assessment/Plan:  73 y.o. male, with hx COPD, CAD s/p STEMI, HTN, HLD, tobacco abuse that  presents for 6 month follow-up for ongoing surveillance  after previous EVAR with right iliac branch endoprosthesis.  On 04/25/2023 he underwent EVAR plus right iliac branch endoprosthesis for a 3.8 cm AAA and 5.4 cm right common iliac aneurysm.    Discussed no significant growth in his aneurysm sac diameter or right common iliac aneurysm diameter over the last 6 months compared to his initial baseline study after repair.  Will continue with surveillance with repeat imaging in 1 year with EVAR duplex.   Lonni DOROTHA Gaskins, MD [facility] Office: [phone]

## 2024-09-11 ENCOUNTER — Encounter: Payer: Self-pay | Admitting: Vascular Surgery

## 2024-09-11 ENCOUNTER — Ambulatory Visit: Attending: Vascular Surgery | Admitting: Vascular Surgery

## 2024-09-11 VITALS — BP 151/84 | HR 68 | Temp 98.0°F | Resp 18 | Ht 66.0 in | Wt 148.5 lb

## 2024-09-11 DIAGNOSIS — I7143 Infrarenal abdominal aortic aneurysm, without rupture: Secondary | ICD-10-CM

## 2024-09-11 DIAGNOSIS — I723 Aneurysm of iliac artery: Secondary | ICD-10-CM | POA: Diagnosis not present

## 2024-09-18 ENCOUNTER — Telehealth: Payer: Self-pay | Admitting: Adult Health

## 2024-09-18 NOTE — Telephone Encounter (Signed)
 Copied from CRM #[id number]. Topic: General - Other >> Sep 11, 2024 10:28 AM Carlyon D wrote: Reason for CRM: Pt is calling in regards to referral he has been waiting on for almost a month. Called [facility] spoke to miss Madeline she is reaching out to the referral department and stated she would call pt back, advised pt he stated he does NOT want a call back and to just upload the referral so he can make an appt already.

## 2024-10-02 ENCOUNTER — Telehealth

## 2024-10-02 ENCOUNTER — Other Ambulatory Visit: Payer: Self-pay | Admitting: Physician Assistant

## 2024-10-03 ENCOUNTER — Ambulatory Visit (HOSPITAL_BASED_OUTPATIENT_CLINIC_OR_DEPARTMENT_OTHER): Admission: RE | Admit: 2024-10-03 | Discharge: 2024-10-03 | Attending: Acute Care | Admitting: Acute Care

## 2024-10-03 ENCOUNTER — Encounter (HOSPITAL_BASED_OUTPATIENT_CLINIC_OR_DEPARTMENT_OTHER): Payer: Self-pay

## 2024-10-03 ENCOUNTER — Emergency Department (HOSPITAL_BASED_OUTPATIENT_CLINIC_OR_DEPARTMENT_OTHER)
Admission: EM | Admit: 2024-10-03 | Discharge: 2024-10-03 | Disposition: A | Discharge status: Home / Self Care | Attending: Emergency Medicine | Admitting: Emergency Medicine

## 2024-10-03 ENCOUNTER — Emergency Department (HOSPITAL_BASED_OUTPATIENT_CLINIC_OR_DEPARTMENT_OTHER): Admitting: Radiology

## 2024-10-03 ENCOUNTER — Ambulatory Visit: Payer: Self-pay

## 2024-10-03 DIAGNOSIS — F17210 Nicotine dependence, cigarettes, uncomplicated: Secondary | ICD-10-CM

## 2024-10-03 DIAGNOSIS — M545 Low back pain, unspecified: Secondary | ICD-10-CM | POA: Insufficient documentation

## 2024-10-03 DIAGNOSIS — J449 Chronic obstructive pulmonary disease, unspecified: Secondary | ICD-10-CM | POA: Diagnosis not present

## 2024-10-03 DIAGNOSIS — E785 Hyperlipidemia, unspecified: Secondary | ICD-10-CM | POA: Diagnosis not present

## 2024-10-03 DIAGNOSIS — Z79899 Other long term (current) drug therapy: Secondary | ICD-10-CM | POA: Diagnosis not present

## 2024-10-03 DIAGNOSIS — E876 Hypokalemia: Secondary | ICD-10-CM | POA: Diagnosis not present

## 2024-10-03 DIAGNOSIS — E871 Hypo-osmolality and hyponatremia: Secondary | ICD-10-CM | POA: Diagnosis not present

## 2024-10-03 DIAGNOSIS — R079 Chest pain, unspecified: Secondary | ICD-10-CM | POA: Insufficient documentation

## 2024-10-03 DIAGNOSIS — R0602 Shortness of breath: Secondary | ICD-10-CM | POA: Diagnosis not present

## 2024-10-03 DIAGNOSIS — Z87891 Personal history of nicotine dependence: Secondary | ICD-10-CM | POA: Diagnosis present

## 2024-10-03 DIAGNOSIS — I10 Essential (primary) hypertension: Secondary | ICD-10-CM | POA: Diagnosis not present

## 2024-10-03 DIAGNOSIS — I2510 Atherosclerotic heart disease of native coronary artery without angina pectoris: Secondary | ICD-10-CM | POA: Insufficient documentation

## 2024-10-03 DIAGNOSIS — Z122 Encounter for screening for malignant neoplasm of respiratory organs: Secondary | ICD-10-CM | POA: Insufficient documentation

## 2024-10-03 DIAGNOSIS — Z7982 Long term (current) use of aspirin: Secondary | ICD-10-CM | POA: Diagnosis not present

## 2024-10-03 DIAGNOSIS — R42 Dizziness and giddiness: Secondary | ICD-10-CM | POA: Diagnosis not present

## 2024-10-03 DIAGNOSIS — R5383 Other fatigue: Secondary | ICD-10-CM | POA: Diagnosis present

## 2024-10-03 DIAGNOSIS — Z7951 Long term (current) use of inhaled steroids: Secondary | ICD-10-CM | POA: Insufficient documentation

## 2024-10-03 LAB — CBC
HCT: 41.1 % (ref 39.0–52.0)
Hemoglobin: 14.7 g/dL (ref 13.0–17.0)
MCH: 32.2 pg (ref 26.0–34.0)
MCHC: 35.8 g/dL (ref 30.0–36.0)
MCV: 89.9 fL (ref 80.0–100.0)
Platelets: 159 K/uL (ref 150–400)
RBC: 4.57 MIL/uL (ref 4.22–5.81)
RDW: 12.3 % (ref 11.5–15.5)
WBC: 8.3 K/uL (ref 4.0–10.5)
nRBC: 0 % (ref 0.0–0.2)

## 2024-10-03 LAB — BASIC METABOLIC PANEL WITH GFR
Anion gap: 19 — ABNORMAL HIGH (ref 5–15)
BUN: 16 mg/dL (ref 8–23)
CO2: 22 mmol/L (ref 22–32)
Calcium: 9.5 mg/dL (ref 8.9–10.3)
Chloride: 90 mmol/L — ABNORMAL LOW (ref 98–111)
Creatinine, Ser: 0.89 mg/dL (ref 0.61–1.24)
GFR, Estimated: >60 mL/min (ref >60)
Glucose, Bld: 150 mg/dL — ABNORMAL HIGH (ref 70–99)
Potassium: 3 mmol/L — ABNORMAL LOW (ref 3.5–5.1)
Sodium: 131 mmol/L — ABNORMAL LOW (ref 135–145)

## 2024-10-03 LAB — RESP PANEL BY RT-PCR (RSV, FLU A&B, COVID)  RVPGX2
Influenza A by PCR: NEGATIVE
Influenza B by PCR: NEGATIVE
Resp Syncytial Virus by PCR: NEGATIVE
SARS Coronavirus 2 by RT PCR: NEGATIVE

## 2024-10-03 LAB — TROPONIN T, HIGH SENSITIVITY
Troponin T High Sensitivity: 15 ng/L (ref 0–19)
Troponin T High Sensitivity: 15 ng/L (ref 0–19)

## 2024-10-03 MED ORDER — POTASSIUM CHLORIDE CRYS ER 20 MEQ PO TBCR
40.0000 meq | EXTENDED_RELEASE_TABLET | Freq: Once | ORAL | Status: AC
  Administered 2024-10-03: 17:00:00 40 meq via ORAL
  Filled 2024-10-03: qty 2

## 2024-10-03 MED ORDER — MAGNESIUM SULFATE 2 GM/50ML IV SOLN
2.0000 g | Freq: Once | INTRAVENOUS | Status: AC
  Administered 2024-10-03: 16:00:00 2 g via INTRAVENOUS
  Filled 2024-10-03: qty 50

## 2024-10-03 MED ORDER — CHLORDIAZEPOXIDE HCL 25 MG PO CAPS: ORAL_CAPSULE | ORAL | 0 refills | Status: AC

## 2024-10-03 MED ORDER — SODIUM CHLORIDE 0.9 % IV BOLUS
1000.0000 mL | Freq: Once | INTRAVENOUS | Status: AC
  Administered 2024-10-03: 16:00:00 1000 mL via INTRAVENOUS

## 2024-10-03 MED ORDER — POTASSIUM CHLORIDE 10 MEQ/100ML IV SOLN
10.0000 meq | INTRAVENOUS | Status: AC
  Administered 2024-10-03 (×2): 10 meq via INTRAVENOUS
  Filled 2024-10-03 (×2): qty 100

## 2024-10-03 MED ORDER — SODIUM CHLORIDE 0.9 % IV SOLN: Freq: Once | INTRAVENOUS | Status: AC

## 2024-10-03 NOTE — ED Provider Notes (Signed)
 [place] EMERGENCY DEPARTMENT AT [facility] Provider Note   CSN: [id number] Arrival date & time: 10/03/24  1421     Patient presents with: Chest Pain   Jeffrey Hudson is a 73 y.o. male patient with history of tobacco abuse, coronary artery disease, hyperlipidemia, hypertension, alcohol abuse who presents to the emergency department today for further evaluation of generalized weakness, chest pain, and low back pain, and headache.  Patient states that he drinks three 8 ounce glasses of scotch with wine periodically throughout the day.  He has been doing this for years.  On Monday he decided to stop cold turkey in addition to stop smoking.  Patient also does CBD which he also stopped cold turkey.  He denies any fever, chills.  Does endorse loss of appetite.  Has not eaten anything in 3 days.    Chest Pain      Prior to Admission medications  Medication Sig Start Date End Date Taking? Authorizing Provider  chlordiazePOXIDE  (LIBRIUM ) 25 MG capsule 50mg  PO TID x 1D, then 25-50mg  PO BID X 1D, then 25-50mg  PO QD X 1D 10/03/24  Yes Jessly Lebeck M, PA-C  albuterol  (PROAIR  HFA) 108 (90 Base) MCG/ACT inhaler Inhale 2 puffs into the lungs every 6 (six) hours as needed. 12/28/18   Jude Harden GAILS, MD  aspirin  81 MG tablet Take 81 mg by mouth daily.    [provider]  betamethasone  dipropionate 0.05 % cream Apply 1 application  topically as needed (for eczema). 06/28/19   [provider]  carvedilol  (COREG ) 3.125 MG tablet TAKE 1 TABLET BY MOUTH TWICE  DAILY WITH MEAL 10/03/24   Lelon Hamilton T, PA-C  chlorthalidone  (HYGROTON ) 25 MG tablet TAKE 1 TABLET BY MOUTH DAILY 08/08/24   Verlin Lonni BIRCH, MD  clobetasol  ointment (TEMOVATE ) 0.05 % Apply 1 application  topically 2 (two) times daily as needed (rash). 02/15/22   [provider]  cyclobenzaprine  (FLEXERIL ) 10 MG tablet Take 1 tablet (10 mg total) by mouth 3 (three) times daily as needed for muscle  spasms. 04/26/24   Nafziger, Darleene, NP  diclofenac Sodium (VOLTAREN) 1 % GEL Apply 1 Application topically 4 (four) times daily as needed (pain).    [provider]  doxazosin  (CARDURA ) 2 MG tablet TAKE 1 TABLET BY MOUTH DAILY 04/04/24   Lelon Hamilton T, PA-C  Evolocumab  (REPATHA  SURECLICK) 140 MG/ML SOAJ INJECT 1 PEN SUBCUTANEOUSLY  EVERY 2 WEEKS 09/10/24   Verlin Lonni BIRCH, MD  fluocinonide  (LIDEX ) 0.05 % external solution Apply 1 Application topically 2 (two) times daily as needed (scalp irritation). 10/17/22   [provider]  FLUoxetine  (PROZAC ) 20 MG capsule Take 1 capsule (20 mg total) by mouth daily. 05/16/24   Plovsky, Elna, MD  fluticasone  (FLONASE ) 50 MCG/ACT nasal spray SPRAY 2 SPRAYS INTO EACH NOSTRIL EVERY DAY 08/24/24   Cobb, Comer GAILS, NP  gabapentin  (NEURONTIN ) 300 MG capsule 1 bid 05/16/24   Plovsky, Elna, MD  hydrOXYzine  (VISTARIL ) 25 MG capsule Take 1 capsule (25 mg total) by mouth 3 (three) times daily as needed. 05/16/24   Plovsky, Elna, MD  latanoprost  (XALATAN ) 0.005 % ophthalmic solution Place 1 drop into both eyes at bedtime. 04/09/21   [provider]  meloxicam  (MOBIC ) 7.5 MG tablet Take 1 tablet (7.5 mg total) by mouth daily. 03/07/24   Nafziger, Darleene, NP  Multiple Vitamin (MULTIVITAMIN) tablet Take 1 tablet by mouth daily.    [provider]  nitroGLYCERIN  (NITROSTAT ) 0.4 MG SL tablet Place  1 tablet (0.4 mg total) under the tongue every 5 (five) minutes as needed for chest pain. 10/30/21   Lelon Hamilton T, PA-C  pantoprazole  (PROTONIX ) 40 MG tablet TAKE 1 TABLET BY MOUTH DAILY  BEFORE BREAKFAST 02/08/24   Avram Lupita BRAVO, MD  rosuvastatin  (CRESTOR ) 40 MG tablet TAKE 1 TABLET BY MOUTH DAILY 04/04/24   Lelon Hamilton T, PA-C  timolol  (BETIMOL ) 0.5 % ophthalmic solution Place 1 drop into both eyes daily.    [provider]    Allergies: Lisinopril , Metformin  and related, Norvasc  [amlodipine ], and Other    Review of Systems   Cardiovascular:  Positive for chest pain.  All other systems reviewed and are negative.   Updated Vital Signs BP (!) 160/85   Pulse 77   Temp 99.6 F (37.6 C) (Oral)   Resp 16   SpO2 94%   Physical Exam Vitals and nursing note reviewed.  Constitutional:      General: He is not in acute distress.    Appearance: Normal appearance.  HENT:     Head: Normocephalic and atraumatic.  Eyes:     General:        Right eye: No discharge.        Left eye: No discharge.  Cardiovascular:     Comments: Regular rate and rhythm.  S1/S2 are distinct without any evidence of murmur, rubs, or gallops.  Radial pulses are 2+ bilaterally.  Dorsalis pedis pulses are 2+ bilaterally.  No evidence of pedal edema. Pulmonary:     Comments: Clear to auscultation bilaterally.  Normal effort.  No respiratory distress.  No evidence of wheezes, rales, or rhonchi heard throughout. Abdominal:     General: Abdomen is flat. Bowel sounds are normal. There is no distension.     Tenderness: There is no abdominal tenderness. There is no guarding or rebound.  Musculoskeletal:        General: Normal range of motion.     Cervical back: Neck supple.  Skin:    General: Skin is warm and dry.     Findings: No rash.  Neurological:     General: No focal deficit present.     Mental Status: He is alert.  Psychiatric:        Mood and Affect: Mood normal.        Behavior: Behavior normal.     (all labs ordered are listed, but only abnormal results are displayed) Labs Reviewed  BASIC METABOLIC PANEL WITH GFR - Abnormal; Notable for the following components:      Result Value   Sodium 131 (*)    Potassium 3.0 (*)    Chloride 90 (*)    Glucose, Bld 150 (*)    Anion gap 19 (*)    All other components within normal limits  RESP PANEL BY RT-PCR (RSV, FLU A&B, COVID)  RVPGX2  CBC  TROPONIN T, HIGH SENSITIVITY  TROPONIN T, HIGH SENSITIVITY    EKG: EKG Interpretation Date/Time:  Wednesday October 03 2024 14:37:35  EST Ventricular Rate:  94 PR Interval:  142 QRS Duration:  96 QT Interval:  352 QTC Calculation: 440 R Axis:   -55  Text Interpretation: Normal sinus rhythm Left axis deviation Nonspecific ST abnormality Abnormal ECG When compared with ECG of 03-Oct-2024 14:36, No significant change was found No acute changes Confirmed by Charlyn Sora ([id number]) on 10/03/2024 4:16:00 PM  Radiology: DG Chest 2 View Result Date: 10/03/2024 EXAM: 2 VIEW(S) XRAY OF THE CHEST 10/03/2024 03:01:00 PM COMPARISON: Comparison with  chest radiograph 04/25/2023 and CT chest 10/03/2024. CLINICAL HISTORY: Chest pain for weeks extending across the chest. Shortness of breath and dizziness. FINDINGS: LUNGS AND PLEURA: Peribronchial thickening with bronchiectasis and interstitial changes in the lung bases likely representing chronic bronchitis and fibrosis. No airspace disease or consolidation in the lungs. No pleural effusion or pneumothorax. HEART AND MEDIASTINUM: Heart size and pulmonary vascularity are normal. Mediastinal contours appear intact. Calcification of the aorta. BONES AND SOFT TISSUES: No acute osseous abnormality. IMPRESSION: 1. No acute cardiopulmonary abnormality. 2. Peribronchial thickening with bronchiectasis and basilar interstitial changes, likely chronic bronchitis and fibrosis. Electronically signed by: Elsie Gravely MD 10/03/2024 03:22 PM EST RP Workstation: [id number]     Procedures   Medications Ordered in the ED  sodium chloride  0.9 % bolus 1,000 mL (0 mLs Intravenous Stopped 10/03/24 1724)  potassium chloride  SA (KLOR-CON  M) CR tablet 40 mEq (40 mEq Oral Given 10/03/24 1719)  potassium chloride  10 mEq in 100 mL IVPB (0 mEq Intravenous Stopped 10/03/24 1828)  magnesium  sulfate IVPB 2 g 50 mL (0 g Intravenous Stopped 10/03/24 1704)  0.9 %  sodium chloride  infusion (0 mLs Intravenous Stopped 10/03/24 1847)    Clinical Course as of 10/03/24 1936  Wed Oct 03, 2024  1750 CBC Negative. [CF]  1750  Resp panel by RT-PCR (RSV, Flu A&B, Covid) Anterior Nasal Swab Negative.  [CF]  1750 Troponin T, High Sensitivity Initial troponin is negative. [CF]  1931 Troponin T, High Sensitivity Delta troponin negative [CF]  1931 DG Chest 2 View Negative.  I do agree with the radiologist interpretation. [CF]  1931 On repeat evaluation, patient is feeling much better after fluids and electrolyte repletion.  I went over all of the labs and imaging with him and family at the bedside.  I discussed giving him a Librium  taper to help with with his alcohol cessation.  Patient agreeable with plan.  We talked about what this medication does and how he should not drink alcohol while taking this medication.  He expressed full understanding.  We discussed that is likely all related to the abrupt cessation of alcohol, CBD, and tobacco.  Will have him follow-up with his primary care doctor.  Resources given for alcohol help. [CF]    Clinical Course User Index [CF] Theotis Cameron HERO, PA-C   Medical Decision Making This patient presents to the ED for concern of fatigue and chest pain, this involves an extensive number of treatment options, and is a complaint that carries with it a high risk of complications and morbidity.  The differential diagnosis includes alcohol withdrawal, ACS, stable angina, reflux, infectious causes like COVID, flu, RSV.   Co morbidities that complicate the patient evaluation  Alcohol use Tobacco use Hypertension Hyperlipidemia CBD use COPD    Additional history obtained:  Additional history and/or information obtained from chart review, notable for previous ED visits   Lab Tests:  I Ordered, and personally interpreted labs.  The pertinent results include: See ED clinical course   Imaging Studies ordered:  I ordered imaging studies including chest x-ray I independently visualized and interpreted imaging which showed no acute findings I agree with the radiologist  interpretation   Cardiac Monitoring:  The patient was maintained on a cardiac monitor.  I personally viewed and interpreted the cardiac monitored which showed an underlying rhythm of: Normal sinus rhythm   Medicines ordered and prescription drug management:  I ordered medication including electrolytes for electrolyte repletion Reevaluation of the patient after these medicines showed that the  patient improved I have reviewed the patients home medicines and have made adjustments as needed   Test Considered:  None   Critical Interventions:  Fluids and potassium and magnesium    Consultations Obtained:     Problem List / ED Course:  See ED clinical course   Reevaluation:  After the interventions noted above, I reevaluated the patient and found that they have :improved   Social Determinants of Health:  Health literacy   Disposition:  After consideration of the diagnostic results and the patients response to treatment, I feel that the patient would benefit from discharge home with outpatient follow-up with his primary care doctor and potential out patient treatment for alcohol.  Librium  taper given.   Amount and/or Complexity of Data Reviewed Labs: ordered. Decision-making details documented in ED Course. Radiology: ordered. Decision-making details documented in ED Course.  Risk Prescription drug management.     Final diagnoses:  Chest pain, unspecified type  Other fatigue  Hypokalemia  Hyponatremia    ED Discharge Orders          Ordered    chlordiazePOXIDE  (LIBRIUM ) 25 MG capsule        10/03/24 1929               Theotis Peers [place], [place] 10/03/24 1937    Charlyn Sora, MD 10/03/24 2144

## 2024-10-03 NOTE — Discharge Instructions (Signed)
 As we discussed, I think this is likely related to your abrupt cessation of alcohol, tobacco, and CBD.  I given you a Librium  taper like we discussed.  I strongly advise you not to take this and continue drinking.  I have given you resource guides for outpatient therapy to help.  Please return to the emergency department for any worsening symptoms.  Otherwise, follow-up with your primary care doctor.

## 2024-10-03 NOTE — ED Notes (Signed)
 DC paperwork given and verbally understood.

## 2024-10-03 NOTE — ED Triage Notes (Signed)
 Patient reports he also feels like he is sick, but states he has felt so bad he stopped drinking and smoking cold turkey when he was typically a daily drinker and smoker. Last drink Monday.

## 2024-10-03 NOTE — Telephone Encounter (Signed)
 Left message to return phone call to see if he was able to seek help.

## 2024-10-03 NOTE — Telephone Encounter (Signed)
 FYI Only or Action Required?: Action required by provider: update on patient condition.  Patient was last seen in primary care on 08/01/2024 by Merna Huxley, NP.  Called Nurse Triage reporting Dizziness.  Symptoms began several months ago.  Interventions attempted: Prescription medications: meloxicam  and extra strenngth tylenol .  Symptoms are: gradually worsening.  Triage Disposition: Go to ED Now (or PCP Triage)  Patient/caregiver understands and will follow disposition?: Unsure (pt states he might go)       Copied from CRM #[id number]. Topic: Clinical - Red Word Triage >> Oct 03, 2024  9:35 AM Deaijah H wrote: Red Word that prompted transfer to Nurse Triage: In pain on right side of (chest , neck, back). Reason for Disposition  Patient sounds very sick or weak to the triager  Answer Assessment - Initial Assessment Questions This RN recommends pt goes to ED and has another adult drive pt there. Pt states he might go. This RN called [facility] to notify office. This RN will send a high priority message to clinic for review.  Constant pain on right side (below right breast). Pt states PCP is aware. Pt states he wants to go over CT results with PCP.  New symptoms pt states PCP is not aware of: Constant headache (5/10 pain level) that he can't get rid of for months Lethargic/ lightheaded (when standing) constant for months; denies falling but pt is scared Unsteady Mild confusion Night sweats- forehead wet when pt woke up this morning  Denies: Vision changes Numbness Speech changes Fever  Other: SOB started years ago (pt has COPD)  Pt states he is a smoker and the other day he woke up and could not handle a cigarette. Pt staes the same happened with alcohol (pt was drinking every day). Pt is concerned that he might be experiencing withdrawls.  Protocols used: Dizziness - Lightheadedness-A-AH

## 2024-10-03 NOTE — ED Triage Notes (Signed)
 Patient reports chest pain for weeks but it is becoming more pronounced. States it feels like it goes across his chest. Denies radiation to neck, back, jaw, or arm. Reports shortness of breath and dizziness as well.

## 2024-10-09 ENCOUNTER — Other Ambulatory Visit: Payer: Self-pay | Admitting: Cardiovascular Disease

## 2024-10-09 ENCOUNTER — Other Ambulatory Visit: Payer: Self-pay

## 2024-10-09 DIAGNOSIS — Z87891 Personal history of nicotine dependence: Secondary | ICD-10-CM

## 2024-10-09 DIAGNOSIS — Z122 Encounter for screening for malignant neoplasm of respiratory organs: Secondary | ICD-10-CM

## 2024-10-09 MED ORDER — CHLORTHALIDONE 25 MG PO TABS: 25.0000 mg | ORAL_TABLET | Freq: Every day | ORAL | 0 refills | Status: AC

## 2024-10-09 NOTE — Addendum Note (Signed)
 Addended by: BLUFORD RAMP D on: 10/09/2024 11:40 AM   Modules accepted: Orders

## 2024-10-18 ENCOUNTER — Other Ambulatory Visit: Payer: Self-pay | Admitting: Medical Genetics

## 2024-10-19 ENCOUNTER — Encounter: Payer: Self-pay | Admitting: Podiatry

## 2024-10-19 ENCOUNTER — Ambulatory Visit: Admitting: Podiatry

## 2024-10-19 DIAGNOSIS — B351 Tinea unguium: Secondary | ICD-10-CM

## 2024-10-19 DIAGNOSIS — M79676 Pain in unspecified toe(s): Secondary | ICD-10-CM

## 2024-10-19 NOTE — Progress Notes (Signed)
This patient returns to the office for evaluation and treatment of long thick painful nails .  This patient is unable to trim his own nails since the patient cannot reach the feet.  Patient says the nails are painful walking and wearing his shoes.  He returns for preventive foot care services.  General Appearance  Alert, conversant and in no acute stress.  Vascular  Dorsalis pedis and posterior tibial  pulses are palpable  bilaterally.  Capillary return is within normal limits  bilaterally. Temperature is within normal limits  bilaterally.  Neurologic  Senn-Weinstein monofilament wire test within normal limits  bilaterally. Muscle power within normal limits bilaterally.  Nails Thick disfigured discolored nails with subungual debris  from hallux to fifth toes bilaterally. No evidence of bacterial infection or drainage bilaterally.  Orthopedic  No limitations of motion  feet .  No crepitus or effusions noted.  No bony pathology or digital deformities noted.  Skin  normotropic skin with no porokeratosis noted bilaterally.  No signs of infections or ulcers noted.     Onychomycosis  Pain in toes right foot  Pain in toes left foot  Debridement  of nails  1-5  B/L with a nail nipper.  Nails were then filed using a dremel tool with no incidents.    RTC 3 months    Argie Lober DPM  

## 2024-10-25 ENCOUNTER — Encounter: Payer: Self-pay | Admitting: Family Medicine

## 2024-10-25 ENCOUNTER — Ambulatory Visit (INDEPENDENT_AMBULATORY_CARE_PROVIDER_SITE_OTHER): Admitting: Family Medicine

## 2024-10-25 DIAGNOSIS — Z0000 Encounter for general adult medical examination without abnormal findings: Secondary | ICD-10-CM | POA: Diagnosis not present

## 2024-10-25 NOTE — Patient Instructions (Addendum)
 I really enjoyed getting to talk with you today! I am available on Tuesdays and Thursdays for virtual visits if you have any questions or concerns, or if I can be of any further assistance.   Congratulations on giving up alcohol and tobacco - that is amazing!  CHECKLIST FROM ANNUAL WELLNESS VISIT:  -Follow up (please call to schedule if not scheduled after visit):   -yearly for annual wellness visit with primary care office  Here is a list of your preventive care/health maintenance measures and the plan for each if any are due:  PLAN For any measures below that may be due:   Health Maintenance  Topic Date Due   Medicare Annual Wellness (AWV)  05/26/2024   COVID-19 Vaccine (8 - 2025-26 season) 01/27/2025   DTaP/Tdap/Td (2 - Tdap) 02/03/2025   Lung Cancer Screening  10/03/2025   Pneumococcal Vaccine: 50+ Years  Completed   Influenza Vaccine  Completed   Hepatitis C Screening  Completed   Zoster Vaccines- Shingrix  Completed   Meningococcal B Vaccine  Aged Out   Colonoscopy  Discontinued    -See a dentist at least yearly  -Get your eyes checked and then per your eye specialist's recommendations  -Other issues addressed today:   -I have included below further information regarding a healthy whole foods based diet, physical activity guidelines for adults, stress management and opportunities for social connections. I hope you find this information useful.   -----------------------------------------------------------------------------------------------------------------------------------------------------------------------------------------------------------------------------------------------------------    NUTRITION: -eat real food: lots of colorful vegetables (half the plate) and fruits -5-7 servings of vegetables and fruits per day (fresh or steamed is best), exp. 2 servings of vegetables with lunch and dinner and 2 servings of fruit per day. Berries and greens such as kale  and collards are great choices.  -consume on a regular basis:  fresh fruits, fresh veggies, fish, nuts, seeds, healthy oils (such as olive oil, avocado oil), whole grains (make sure for bread/pasta/crackers/etc., that the first ingredient on label contains the word whole), legumes. -can eat small amounts of dairy and lean meat (no larger than the palm of your hand), but avoid processed meats such as ham, bacon, lunch meat, etc. -drink water -try to avoid fast food and pre-packaged foods, processed meat, ultra processed foods/beverages (donuts, candy, etc.) -most experts advise limiting sodium to < 2300mg  per day, should limit further is any chronic conditions such as high blood pressure, heart disease, diabetes, etc. The American Heart Association advised that < 1500mg  is is ideal -try to avoid foods/beverages that contain any ingredients with names you do not recognize  -try to avoid foods/beverages  with added sugar or sweeteners/sweets  -try to avoid sweet drinks (including diet drinks): soda, juice, Gatorade, sweet tea, power drinks, diet drinks -try to avoid white rice, white bread, pasta (unless whole grain)  EXERCISE GUIDELINES FOR ADULTS: -if you wish to increase your physical activity, do so gradually and with the approval of your doctor -STOP and seek medical care immediately if you have any chest pain, chest discomfort or trouble breathing when starting or increasing exercise  -move and stretch your body, legs, feet and arms when sitting for long periods -Physical activity guidelines for optimal health in adults: -get at least 150 minutes per week of moderate exercise (can talk, but not sing); this is about 20-30 minutes of sustained activity 5-7 days per week or two 10-15 minute episodes of sustained activity 5-7 days per week -do some muscle building/resistance training/strength training at least 2 days  per week  -balance exercises 3+ days per week:   Stand somewhere where you have  something sturdy to hold onto if you lose balance    1) lift up on toes, then back down, start with 5x per day and work up to 20x   2) stand and lift one leg straight out to the side so that foot is a few inches of the floor, start with 5x each side and work up to 20x each side   3) stand on one foot, start with 5 seconds each side and work up to 20 seconds on each side  If you need ideas or help with getting more active:  -Silver sneakers https://tools.silversneakers.com  -Walk with a Doc: Http://www.duncan-williams.com/  -try to include resistance (weight lifting/strength building) and balance exercises twice per week: or the following link for ideas: http://castillo-powell.com/  buyducts.dk  STRESS MANAGEMENT: -can try meditating, or just sitting quietly with deep breathing while intentionally relaxing all parts of your body for 5 minutes daily -if you need further help with stress, anxiety or depression please follow up with your primary doctor or contact the wonderful folks at [facility]: [phone]  SOCIAL CONNECTIONS: -options in [place] if you wish to engage in more social and exercise related activities:  -Silver sneakers https://tools.silversneakers.com  -Walk with a Doc: Http://www.duncan-williams.com/  -Check out the [facility] Active Adults 50+ section on the [place] [employer] (hiking clubs, book clubs, cards and games, chess, exercise classes, aquatic classes and much more) - see the website for details: https://www.[place].gov/departments/parks-recreation/active-adults50  -YouTube has lots of exercise videos for different ages and abilities as well  -[facility] (a variety of indoor and outdoor inperson activities for adults). ([phone])[phone]. [address].  -Virtual Online Classes (a variety of topics): see seniorplanet.org or call  [phone]  -consider volunteering at a school, hospice center, church, senior center or elsewhere

## 2024-10-25 NOTE — Progress Notes (Signed)
 To check-in Nursing staff: Please review Meds, Allergies, PMH, and care teams and update Please request wt, home BP, etc and update vitals if able Please complete the following flowsheets under the Medicare Visits Tab:  Medicare Wellness  Stress  PHQ-2-9  Exercise  Social Connections  Method of visit: Not In Person  ----------------------------------------------------------------------------------------------------------------------------------------------------------------------------------------------------------------------  Because this visit was a virtual/telehealth visit, some criteria may be missing or patient reported. Any vitals not documented were not able to be obtained and vitals that have been documented are patient reported.    MEDICARE ANNUAL PREVENTIVE CARE VISIT WITH PROVIDER (Welcome to Medicare, initial annual wellness or annual wellness exam)  Virtual Visit via Video Note  I connected with Jeffrey Hudson on 10/25/2024  by  a video enabled telemedicine application and verified that I am speaking with the correct person using two identifiers.  Location patient: home Location provider:work or home office Persons participating in the virtual visit: patient, provider  Concerns and/or follow up today: detailed intake and health/risks assessment completed on flow sheets and below- please see for details. Reports doing ok now. Gave up smoking and alcohol a month ago.   How often do you have a drink containing alcohol?quit drinking about a month ago How many drinks containing alcohol do you have on a typical day when you are drinking?0 How often do you have six or more drinks on one occasion?never now Have you ever smoked?y Quit date if applicable? 12/15th/2025  How many packs a day do/did you smoke? .25-1ppd Do you use smokeless tobacco?n Do you use an illicit drugs?n Last dentist visit?sees dentist sometimes - has dentures Last eye Exam and location? Dr. Abigail, sees  him in 2 weeks - goes every 6-8 weeks   See HM section in Epic for other details of completed HM.    ROS: negative for report of fevers, unintentional weight loss, vision changes, vision loss, hearing loss or change, chest pain, sob, hemoptysis, melena, hematochezia, hematuria,bleeding or bruising  Patient-completed extensive health risk assessment - reviewed and discussed with the patient: See Health Risk Assessment completed with patient prior to the visit either above or in recent phone note. This was reviewed in detailed with the patient today and appropriate recommendations, orders and referrals were placed as needed per Summary below and patient instructions.   Review of Medical History: -PMH, PSH, Family History and current specialty and care providers reviewed and updated and listed below   Patient Care Team: Merna Huxley, NP as PCP - General (Family Medicine) Verlin Lonni BIRCH, MD as PCP - Cardiology (Cardiology) Abigail, Maude POUR [facility])   Past Medical History:  Diagnosis Date   AAA (abdominal aortic aneurysm)    AMI (acute myocardial infarction) (HCC)    Acute ST elevation   Anxiety    Arthritis    neck   Bipolar disorder (HCC)    COPD (chronic obstructive pulmonary disease) (HCC)    Coronary artery disease    a. s/p anterolateral STEMI with BMS placed in large diagonal branch (2009).   Depression    Emphysema of lung (HCC)    GERD (gastroesophageal reflux disease)    Glaucoma    History of kidney stones    Hx of adenomatous polyp of colon 05/06/2021   diminutive - no f/u needed   Hyperlipidemia    Hypertension    Nodule of left lung    6-mm smooothly rounded nodule   Plantar fasciitis    left foot   Pre-diabetes  Syncope and collapse    in 2001 and 2004 with no clear cause   Tobacco abuse    Viral URI with cough 11/22/2016    Past Surgical History:  Procedure Laterality Date   ABDOMINAL AORTIC ENDOVASCULAR STENT GRAFT Bilateral 04/25/2023    Procedure: ABDOMINAL AORTIC ENDOVASCULAR STENT GRAFT WITH RIGHT ILIAC BRANCH ENDOPROSTHESIS;  Surgeon: Gretta Lonni PARAS, MD;  Location: [facility] OR;  Service: Vascular;  Laterality: Bilateral;   ABDOMINAL SURGERY  04/25/2023   COLONOSCOPY     PERCUTANEOUS CORONARY STENT INTERVENTION (PCI-S)     TONSILLECTOMY     removed as a child, also removed uvula   ULTRASOUND GUIDANCE FOR VASCULAR ACCESS Bilateral 04/25/2023   Procedure: ULTRASOUND GUIDANCE FOR VASCULAR ACCESS;  Surgeon: Gretta Lonni PARAS, MD;  Location: [facility] OR;  Service: Vascular;  Laterality: Bilateral;   UPPER GASTROINTESTINAL ENDOSCOPY      Social History   Socioeconomic History   Marital status: Married    Spouse name: Not on file   Number of children: 1   Years of education: Not on file   Highest education level: Bachelor's degree (e.g., BA, AB, BS)  Occupational History   Occupation: Retired-Sales/broker  Tobacco Use   Smoking status: Every Day    Current packs/day: 0.25    Average packs/day: 1 pack/day for 56.0 years (54.5 ttl pk-yrs)    Types: Cigarettes    Start date: 1970   Smokeless tobacco: Never   Tobacco comments:    Patient states he's down to smoking 3 cigarettes a day as of 02/22/2024  Vaping Use   Vaping status: Never Used  Substance and Sexual Activity   Alcohol use: Yes    Alcohol/week: 14.0 standard drinks of alcohol    Types: 14 Shots of liquor per week   Drug use: Not Currently   Sexual activity: Not on file  Other Topics Concern   Not on file  Social History Narrative   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 [employer]   He has been married for 35 years    One child (son)       He likes to [employer] - plays once a week    2 glasses of wine a day max, still smoking 2 cups of coffee a day no drug use or other tobacco   Social Drivers of Health   Tobacco Use: High Risk (10/25/2024)   Patient History    Smoking Tobacco Use: Every Day    Smokeless Tobacco Use:  Never    Passive Exposure: Not on file  Financial Resource Strain: Low Risk (10/25/2024)   Overall Financial Resource Strain (CARDIA)    Difficulty of Paying Living Expenses: Not very hard  Food Insecurity: Unknown (10/25/2024)   Epic    Worried About Programme Researcher, Broadcasting/film/video in the Last Year: Never true    Ran Out of Food in the Last Year: Patient unable to answer  Transportation Needs: No Transportation Needs (10/25/2024)   Epic    Lack of Transportation (Medical): No    Lack of Transportation (Non-Medical): No  Physical Activity: Inactive (10/25/2024)   Exercise Vital Sign    Days of Exercise per Week: 0 days    Minutes of Exercise per Session: 0 min  Stress: No Stress Concern Present (10/25/2024)   [employer] of Occupational Health - Occupational Stress Questionnaire    Feeling of Stress: Not at all  Social Connections: Unknown (10/25/2024)   Social Connection and Isolation Panel  Frequency of Communication with Friends and Family: More than three times a week    Frequency of Social Gatherings with Friends and Family: Patient unable to answer    Attends Religious Services: More than 4 times per year    Active Member of Clubs or Organizations: Yes    Attends Banker Meetings: 1 to 4 times per year    Marital Status: Patient unable to answer  Intimate Partner Violence: Not At Risk (10/25/2024)   Epic    Fear of Current or Ex-Partner: No    Emotionally Abused: No    Physically Abused: No    Sexually Abused: No  Depression (PHQ2-9): Low Risk (10/25/2024)   Depression (PHQ2-9)    PHQ-2 Score: 0  Alcohol Screen: Low Risk (10/25/2024)   Alcohol Screen    Last Alcohol Screening Score (AUDIT): 0  Recent Concern: Alcohol Screen - Medium Risk (08/01/2024)   Alcohol Screen    Last Alcohol Screening Score (AUDIT): 10  Housing: Low Risk (10/25/2024)   Epic    Unable to Pay for Housing in the Last Year: No    Number of Times Moved in the Last Year: 0    Homeless in the Last Year: No   Utilities: Not At Risk (10/25/2024)   Epic    Threatened with loss of utilities: No  Health Literacy: Adequate Health Literacy (05/27/2023)   B1300 Health Literacy    Frequency of need for help with medical instructions: Never    Family History  Problem Relation Age of Onset   Dementia Mother 33       died   Alcohol abuse Father 37   Depression Brother    Healthy Son    Colon cancer Neg Hx    Esophageal cancer Neg Hx    Rectal cancer Neg Hx    Stomach cancer Neg Hx     Medications Ordered Prior to Encounter[1]  Allergies[2]     Physical Exam Vitals requested from patient and listed below if patient had equipment and was able to obtain at home for this virtual visit: There were no vitals filed for this visit. Estimated body mass index is 23.98 kg/m as calculated from the following:   Height as of 10/03/24: 5' 6 (1.676 m).   Weight as of 10/03/24: 148 lb 9.4 oz (67.4 kg).  EKG (optional): deferred due to virtual visit  GENERAL: alert, oriented, no acute distress detected; full vision exam deferred due to pandemic and/or virtual encounter  HEENT: atraumatic, conjunttiva clear, no obvious abnormalities on inspection of external nose and ears  NECK: normal movements of the head and neck  LUNGS: on inspection no signs of respiratory distress, breathing rate appears normal, no obvious gross SOB, gasping or wheezing  CV: no obvious cyanosis  MS: moves all visible extremities without noticeable abnormality  PSYCH/NEURO: pleasant and cooperative, no obvious depression or anxiety, speech and thought processing grossly intact, Cognitive function grossly intact  Flowsheet Row Office Visit from 08/23/2023 in [facility]  PHQ-9 Total Score 2        10/25/2024    9:31 AM 10/25/2024    8:57 AM 03/22/2024    9:34 AM 11/29/2023    9:04 AM 08/23/2023    8:35 AM  Depression screen PHQ 2/9  Decreased Interest 0 0   1  Down, Depressed, Hopeless 0 0   1   PHQ - 2 Score 0 0   2  Altered sleeping  0  Tired, decreased energy     0  Change in appetite     0  Feeling bad or failure about yourself      0  Trouble concentrating     0  Moving slowly or fidgety/restless     0  Suicidal thoughts     0  PHQ-9 Score     2   Difficult doing work/chores     Not difficult at all     Information is confidential and restricted. Go to Review Flowsheets to unlock data.   Data saved with a previous flowsheet row definition       03/08/2023   10:04 AM 05/25/2023    7:39 AM 05/27/2023   10:45 AM 10/25/2024    8:57 AM 10/25/2024    9:22 AM  Fall Risk  Falls in the past year? 0 1  1 0   Was there an injury with Fall? 0  0   0  0   Fall Risk Category Calculator 0 1  1 0   Patient at Risk for Falls Due to No Fall Risks  No Fall Risks;Impaired balance/gait No Fall Risks No Fall Risks  Fall risk Follow up Falls evaluation completed  Falls prevention discussed Falls evaluation completed Falls evaluation completed     Manually entered by patient   Data saved with a previous flowsheet row definition     SUMMARY AND PLAN:  Encounter for Medicare annual wellness exam   Discussed applicable health maintenance/preventive health measures and advised and referred or ordered per patient preferences:he is utd Health Maintenance  Topic Date Due   COVID-19 Vaccine (8 - 2025-26 season) 01/27/2025   DTaP/Tdap/Td (2 - Tdap) 02/03/2025   Lung Cancer Screening  10/03/2025   Medicare Annual Wellness (AWV)  10/25/2025   Pneumococcal Vaccine: 50+ Years  Completed   Influenza Vaccine  Completed   Hepatitis C Screening  Completed   Zoster Vaccines- Shingrix  Completed   Meningococcal B Vaccine  Aged Out   Colonoscopy  Discontinued     Education and counseling on the following was provided based on the above review of health and a plan/checklist for the patient, along with additional information discussed, was provided for the patient in the patient instructions  :  -Asked that he bring copy of advanced directives to [employer] and counseled on a healthy lifestyle - including the importance of a healthy diet, regular physical activity, social connections and stress management. -Reviewed patient's current diet. Advised and counseled on a whole foods based healthy diet. A summary of a healthy diet was provided in the Patient Instructions.  -reviewed patient's current physical activity level and discussed exercise guidelines for adults. Discussed community resources and ideas for safe exercise at home to assist in meeting exercise guideline recommendations in a safe and healthy way. He has PT exercises and chair exercises but rarely does them - encouraged to gradually add theses back in. Discussed hand exercises for the hand arthritis.  -Advise yearly dental visits at minimum and regular eye exams -congratulated on giving up alcohol and tobacco and supported. Discussed potential for relapse and strategies to prevent or requit if occurs. It is amazing that he has been able to give up both.   Follow up: see patient instructions   Patient Instructions  I really enjoyed getting to talk with you today! I am available on Tuesdays and Thursdays for virtual visits if you have any questions or concerns, or if I can be  of any further assistance.   Congratulations on giving up alcohol and tobacco - that is amazing!  CHECKLIST FROM ANNUAL WELLNESS VISIT:  -Follow up (please call to schedule if not scheduled after visit):   -yearly for annual wellness visit with primary care office  Here is a list of your preventive care/health maintenance measures and the plan for each if any are due:  PLAN For any measures below that may be due:   Health Maintenance  Topic Date Due   Medicare Annual Wellness (AWV)  05/26/2024   COVID-19 Vaccine (8 - 2025-26 season) 01/27/2025   DTaP/Tdap/Td (2 - Tdap) 02/03/2025   Lung Cancer Screening  10/03/2025   Pneumococcal Vaccine:  50+ Years  Completed   Influenza Vaccine  Completed   Hepatitis C Screening  Completed   Zoster Vaccines- Shingrix  Completed   Meningococcal B Vaccine  Aged Out   Colonoscopy  Discontinued    -See a dentist at least yearly  -Get your eyes checked and then per your eye specialist's recommendations  -Other issues addressed today:   -I have included below further information regarding a healthy whole foods based diet, physical activity guidelines for adults, stress management and opportunities for social connections. I hope you find this information useful.   -----------------------------------------------------------------------------------------------------------------------------------------------------------------------------------------------------------------------------------------------------------    NUTRITION: -eat real food: lots of colorful vegetables (half the plate) and fruits -5-7 servings of vegetables and fruits per day (fresh or steamed is best), exp. 2 servings of vegetables with lunch and dinner and 2 servings of fruit per day. Berries and greens such as kale and collards are great choices.  -consume on a regular basis:  fresh fruits, fresh veggies, fish, nuts, seeds, healthy oils (such as olive oil, avocado oil), whole grains (make sure for bread/pasta/crackers/etc., that the first ingredient on label contains the word whole), legumes. -can eat small amounts of dairy and lean meat (no larger than the palm of your hand), but avoid processed meats such as ham, bacon, lunch meat, etc. -drink water -try to avoid fast food and pre-packaged foods, processed meat, ultra processed foods/beverages (donuts, candy, etc.) -most experts advise limiting sodium to < 2300mg  per day, should limit further is any chronic conditions such as high blood pressure, heart disease, diabetes, etc. The American Heart Association advised that < 1500mg  is is ideal -try to avoid foods/beverages  that contain any ingredients with names you do not recognize  -try to avoid foods/beverages  with added sugar or sweeteners/sweets  -try to avoid sweet drinks (including diet drinks): soda, juice, Gatorade, sweet tea, power drinks, diet drinks -try to avoid white rice, white bread, pasta (unless whole grain)  EXERCISE GUIDELINES FOR ADULTS: -if you wish to increase your physical activity, do so gradually and with the approval of your doctor -STOP and seek medical care immediately if you have any chest pain, chest discomfort or trouble breathing when starting or increasing exercise  -move and stretch your body, legs, feet and arms when sitting for long periods -Physical activity guidelines for optimal health in adults: -get at least 150 minutes per week of moderate exercise (can talk, but not sing); this is about 20-30 minutes of sustained activity 5-7 days per week or two 10-15 minute episodes of sustained activity 5-7 days per week -do some muscle building/resistance training/strength training at least 2 days per week  -balance exercises 3+ days per week:   Stand somewhere where you have something sturdy to hold onto if you lose balance    1) lift  up on toes, then back down, start with 5x per day and work up to 20x   2) stand and lift one leg straight out to the side so that foot is a few inches of the floor, start with 5x each side and work up to 20x each side   3) stand on one foot, start with 5 seconds each side and work up to 20 seconds on each side  If you need ideas or help with getting more active:  -Silver sneakers https://tools.silversneakers.com  -Walk with a Doc: Http://www.duncan-williams.com/  -try to include resistance (weight lifting/strength building) and balance exercises twice per week: or the following link for ideas: http://castillo-powell.com/  buyducts.dk  STRESS MANAGEMENT: -can  try meditating, or just sitting quietly with deep breathing while intentionally relaxing all parts of your body for 5 minutes daily -if you need further help with stress, anxiety or depression please follow up with your primary doctor or contact the wonderful folks at [facility]: [phone]  SOCIAL CONNECTIONS: -options in [place] if you wish to engage in more social and exercise related activities:  -Silver sneakers https://tools.silversneakers.com  -Walk with a Doc: Http://www.duncan-williams.com/  -Check out the [facility] Active Adults 50+ section on the [place] [employer] companies (hiking clubs, book clubs, cards and games, chess, exercise classes, aquatic classes and much more) - see the website for details: https://www.[place].gov/departments/parks-recreation/active-adults50  -YouTube has lots of exercise videos for different ages and abilities as well  -[facility] (a variety of indoor and outdoor inperson activities for adults). [phone]. [address]  -Virtual Online Classes (a variety of topics): see seniorplanet.org or call [phone]  -consider volunteering at a school, hospice center, church, senior center or elsewhere            Chiquita JONELLE Cramp, DO     [1]  Current Outpatient Medications on File Prior to Visit  Medication Sig Dispense Refill   albuterol  (PROAIR  HFA) 108 (90 Base) MCG/ACT inhaler Inhale 2 puffs into the lungs every 6 (six) hours as needed. 1 Inhaler 3   aspirin  81 MG tablet Take 81 mg by mouth daily.     betamethasone  dipropionate 0.05 % cream Apply 1 application  topically as needed (for eczema).     carvedilol  (COREG ) 3.125 MG tablet TAKE 1 TABLET BY MOUTH TWICE  DAILY WITH MEAL 60 tablet 0   chlordiazePOXIDE  (LIBRIUM ) 25 MG capsule 50mg  PO TID x 1D, then 25-50mg  PO BID X 1D, then 25-50mg  PO QD X 1D 10 capsule 0   chlorthalidone  (HYGROTON ) 25 MG tablet Take 1 tablet (25 mg total) by mouth daily.  30 tablet 0   clobetasol  ointment (TEMOVATE ) 0.05 % Apply 1 application  topically 2 (two) times daily as needed (rash).     cyclobenzaprine  (FLEXERIL ) 10 MG tablet Take 1 tablet (10 mg total) by mouth 3 (three) times daily as needed for muscle spasms. 30 tablet 0   diclofenac Sodium (VOLTAREN) 1 % GEL Apply 1 Application topically 4 (four) times daily as needed (pain).     doxazosin  (CARDURA ) 2 MG tablet TAKE 1 TABLET BY MOUTH DAILY 90 tablet 3   Evolocumab  (REPATHA  SURECLICK) 140 MG/ML SOAJ INJECT 1 PEN SUBCUTANEOUSLY  EVERY 2 WEEKS 6 mL 0   fluocinonide  (LIDEX ) 0.05 % external solution Apply 1 Application topically 2 (two) times daily as needed (scalp irritation).     FLUoxetine  (PROZAC ) 20 MG capsule Take 1 capsule (20 mg total) by mouth daily. 90 capsule 2   fluticasone  (  FLONASE ) 50 MCG/ACT nasal spray SPRAY 2 SPRAYS INTO EACH NOSTRIL EVERY DAY 16 mL 2   gabapentin  (NEURONTIN ) 300 MG capsule 1 bid 60 capsule 2   hydrOXYzine  (VISTARIL ) 25 MG capsule Take 1 capsule (25 mg total) by mouth 3 (three) times daily as needed. 30 capsule 4   lamoTRIgine (LAMICTAL) 25 MG tablet Take 1 tablet (25 mg total) by mouth daily.     latanoprost  (XALATAN ) 0.005 % ophthalmic solution Place 1 drop into both eyes at bedtime.     meloxicam  (MOBIC ) 7.5 MG tablet Take 1 tablet (7.5 mg total) by mouth daily. 90 tablet 3   Multiple Vitamin (MULTIVITAMIN) tablet Take 1 tablet by mouth daily.     nitroGLYCERIN  (NITROSTAT ) 0.4 MG SL tablet Place 1 tablet (0.4 mg total) under the tongue every 5 (five) minutes as needed for chest pain. 25 tablet 11   pantoprazole  (PROTONIX ) 40 MG tablet TAKE 1 TABLET BY MOUTH DAILY  BEFORE BREAKFAST 90 tablet 3   rosuvastatin  (CRESTOR ) 40 MG tablet TAKE 1 TABLET BY MOUTH DAILY 90 tablet 3   timolol  (BETIMOL ) 0.5 % ophthalmic solution Place 1 drop into both eyes daily.     No current facility-administered medications on file prior to visit.  [2]  Allergies Allergen Reactions    Lisinopril  Swelling    Angio edema    Metformin  And Related Diarrhea   Norvasc  [Amlodipine ] Swelling    Lower extremity    Other Other (See Comments)

## 2024-11-07 ENCOUNTER — Other Ambulatory Visit: Payer: Self-pay | Admitting: Physician Assistant

## 2024-11-09 NOTE — Telephone Encounter (Signed)
 Please call office to schedule appt w cardiologist for further refills. [phone] Thank you 2nd Attempt

## 2024-11-12 ENCOUNTER — Other Ambulatory Visit

## 2024-11-20 ENCOUNTER — Encounter: Payer: Self-pay | Admitting: Adult Health

## 2024-11-20 ENCOUNTER — Ambulatory Visit: Admitting: Adult Health

## 2024-11-20 VITALS — BP 102/60 | HR 66 | Temp 98.2°F | Ht 66.0 in | Wt 152.0 lb

## 2024-11-20 DIAGNOSIS — F1090 Alcohol use, unspecified, uncomplicated: Secondary | ICD-10-CM | POA: Diagnosis not present

## 2024-11-20 DIAGNOSIS — R5381 Other malaise: Secondary | ICD-10-CM | POA: Diagnosis not present

## 2024-11-20 MED ORDER — NALTREXONE HCL 50 MG PO TABS: 50.0000 mg | ORAL_TABLET | Freq: Every day | ORAL | 0 refills | Status: AC

## 2024-11-21 NOTE — Progress Notes (Unsigned)
 "  No chief complaint on file.   History of Present Illness: 74 yo male with history of CAD, HTN, COPD, tobacco abuse, HLD, bipolar disorder, AAA s/p repair who is here today for cardiac follow up. He has been followed in our office but I have not seen him since 2018. He was last seen in our office in 2024 by Glendia Ferrier, PA-C. He presented with an anterolateral STEMI in 2009 and had a bare metal stent placed in the large Diagonal branch. Last cath April 2011 with mild plaque LAD, patent Diagonal stent with 50% restenosis, 50% mid Circumflex stenosis, 30% distal RCA stenosis, 60% focal PLA stenosis. LV function was normal. AAA s/p EVAR in July 2024, followed in [facility] by Dr. Gretta.   He is here today for follow up. The patient denies any chest pain, dyspnea, palpitations, lower extremity edema, orthopnea, PND, dizziness, near syncope or syncope.   Primary Care Provider Merna Huxley, NP  Past Medical History:  Diagnosis Date   AAA (abdominal aortic aneurysm)    AMI (acute myocardial infarction) (HCC)    Acute ST elevation   Anxiety    Arthritis    neck   Bipolar disorder (HCC)    COPD (chronic obstructive pulmonary disease) (HCC)    Coronary artery disease    a. s/p anterolateral STEMI with BMS placed in large diagonal branch (2009).   Depression    Emphysema of lung (HCC)    GERD (gastroesophageal reflux disease)    Glaucoma    History of kidney stones    Hx of adenomatous polyp of colon 05/06/2021   diminutive - no f/u needed   Hyperlipidemia    Hypertension    Nodule of left lung    6-mm smooothly rounded nodule   Plantar fasciitis    left foot   Pre-diabetes    Syncope and collapse    in 2001 and 2004 with no clear cause   Tobacco abuse    Viral URI with cough 11/22/2016    Past Surgical History:  Procedure Laterality Date   ABDOMINAL AORTIC ENDOVASCULAR STENT GRAFT Bilateral 04/25/2023   Procedure: ABDOMINAL AORTIC ENDOVASCULAR STENT GRAFT WITH RIGHT ILIAC BRANCH  ENDOPROSTHESIS;  Surgeon: Gretta Lonni PARAS, MD;  Location: [facility] OR;  Service: Vascular;  Laterality: Bilateral;   ABDOMINAL SURGERY  04/25/2023   COLONOSCOPY     PERCUTANEOUS CORONARY STENT INTERVENTION (PCI-S)     TONSILLECTOMY     removed as a child, also removed uvula   ULTRASOUND GUIDANCE FOR VASCULAR ACCESS Bilateral 04/25/2023   Procedure: ULTRASOUND GUIDANCE FOR VASCULAR ACCESS;  Surgeon: Gretta Lonni PARAS, MD;  Location: [facility] OR;  Service: Vascular;  Laterality: Bilateral;   UPPER GASTROINTESTINAL ENDOSCOPY      Current Outpatient Medications  Medication Sig Dispense Refill   albuterol  (PROAIR  HFA) 108 (90 Base) MCG/ACT inhaler Inhale 2 puffs into the lungs every 6 (six) hours as needed. (Patient not taking: Reported on 11/20/2024) 1 Inhaler 3   aspirin  81 MG tablet Take 81 mg by mouth daily.     betamethasone  dipropionate 0.05 % cream Apply 1 application  topically as needed (for eczema).     carvedilol  (COREG ) 3.125 MG tablet TAKE 1 TABLET BY MOUTH TWICE  DAILY WITH MEAL 30 tablet 0   celecoxib (CELEBREX) 200 MG capsule Take 200 mg by mouth.     chlordiazePOXIDE  (LIBRIUM ) 25 MG capsule 50mg  PO TID x 1D, then 25-50mg  PO BID X 1D, then 25-50mg  PO QD X 1D 10 capsule  0   chlorthalidone  (HYGROTON ) 25 MG tablet Take 1 tablet (25 mg total) by mouth daily. 30 tablet 0   clobetasol  ointment (TEMOVATE ) 0.05 % Apply 1 application  topically 2 (two) times daily as needed (rash).     cyclobenzaprine  (FLEXERIL ) 10 MG tablet Take 1 tablet (10 mg total) by mouth 3 (three) times daily as needed for muscle spasms. 30 tablet 0   diclofenac Sodium (VOLTAREN) 1 % GEL Apply 1 Application topically 4 (four) times daily as needed (pain).     doxazosin  (CARDURA ) 2 MG tablet TAKE 1 TABLET BY MOUTH DAILY 90 tablet 3   Evolocumab  (REPATHA  SURECLICK) 140 MG/ML SOAJ INJECT 1 PEN SUBCUTANEOUSLY  EVERY 2 WEEKS 6 mL 0   FLUoxetine  (PROZAC ) 20 MG capsule Take 1 capsule (20 mg total) by mouth daily. 90 capsule 2    fluticasone  (FLONASE ) 50 MCG/ACT nasal spray SPRAY 2 SPRAYS INTO EACH NOSTRIL EVERY DAY 16 mL 2   hydrOXYzine  (VISTARIL ) 25 MG capsule Take 1 capsule (25 mg total) by mouth 3 (three) times daily as needed. 30 capsule 4   lamoTRIgine (LAMICTAL) 100 MG tablet Take 100 mg by mouth daily.     latanoprost  (XALATAN ) 0.005 % ophthalmic solution Place 1 drop into both eyes at bedtime.     meloxicam  (MOBIC ) 7.5 MG tablet Take 1 tablet (7.5 mg total) by mouth daily. 90 tablet 3   Multiple Vitamin (MULTIVITAMIN) tablet Take 1 tablet by mouth daily.     naltrexone  (DEPADE) 50 MG tablet Take 1 tablet (50 mg total) by mouth daily. 30 tablet 0   nitroGLYCERIN  (NITROSTAT ) 0.4 MG SL tablet Place 1 tablet (0.4 mg total) under the tongue every 5 (five) minutes as needed for chest pain. 25 tablet 11   pantoprazole  (PROTONIX ) 40 MG tablet TAKE 1 TABLET BY MOUTH DAILY  BEFORE BREAKFAST 90 tablet 3   rosuvastatin  (CRESTOR ) 40 MG tablet TAKE 1 TABLET BY MOUTH DAILY 90 tablet 3   timolol  (TIMOPTIC ) 0.5 % ophthalmic solution 1 drop daily.     No current facility-administered medications for this visit.    Allergies  Allergen Reactions   Lisinopril  Swelling    Angio edema    Metformin  And Related Diarrhea   Norvasc  [Amlodipine ] Swelling    Lower extremity    Other Other (See Comments)    Social History   Socioeconomic History   Marital status: Married    Spouse name: Not on file   Number of children: 1   Years of education: Not on file   Highest education level: Bachelor's degree (e.g., BA, AB, BS)  Occupational History   Occupation: Retired-Sales/broker  Tobacco Use   Smoking status: Every Day    Current packs/day: 0.25    Average packs/day: 1 pack/day for 56.1 years (54.5 ttl pk-yrs)    Types: Cigarettes    Start date: 1970   Smokeless tobacco: Never   Tobacco comments:    Patient states he's down to smoking 1/2 cigarettes a day as of 02/22/2024  Vaping Use   Vaping status: Never Used  Substance  and Sexual Activity   Alcohol use: Yes    Alcohol/week: 14.0 standard drinks of alcohol    Types: 14 Shots of liquor per week   Drug use: Not Currently   Sexual activity: Not on file  Other Topics Concern   Not on file  Social History Narrative   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 [employer]   He has been  married for 35 years    One child (son)       He likes to [employer] - plays once a week    2 glasses of wine a day max, still smoking 2 cups of coffee a day no drug use or other tobacco   Social Drivers of Health   Tobacco Use: High Risk (11/20/2024)   Patient History    Smoking Tobacco Use: Every Day    Smokeless Tobacco Use: Never    Passive Exposure: Not on file  Financial Resource Strain: Low Risk (10/25/2024)   Overall Financial Resource Strain (CARDIA)    Difficulty of Paying Living Expenses: Not very hard  Food Insecurity: Unknown (10/25/2024)   Epic    Worried About Programme Researcher, Broadcasting/film/video in the Last Year: Never true    Ran Out of Food in the Last Year: Patient unable to answer  Transportation Needs: No Transportation Needs (10/25/2024)   Epic    Lack of Transportation (Medical): No    Lack of Transportation (Non-Medical): No  Physical Activity: Inactive (10/25/2024)   Exercise Vital Sign    Days of Exercise per Week: 0 days    Minutes of Exercise per Session: 0 min  Stress: No Stress Concern Present (10/25/2024)   [employer] of Occupational Health - Occupational Stress Questionnaire    Feeling of Stress: Not at all  Social Connections: Unknown (10/25/2024)   Social Connection and Isolation Panel    Frequency of Communication with Friends and Family: More than three times a week    Frequency of Social Gatherings with Friends and Family: Patient unable to answer    Attends Religious Services: More than 4 times per year    Active Member of Clubs or Organizations: Yes    Attends Banker Meetings: 1 to 4 times per year     Marital Status: Patient unable to answer  Intimate Partner Violence: Not At Risk (10/25/2024)   Epic    Fear of Current or Ex-Partner: No    Emotionally Abused: No    Physically Abused: No    Sexually Abused: No  Depression (PHQ2-9): Low Risk (11/20/2024)   Depression (PHQ2-9)    PHQ-2 Score: 4  Alcohol Screen: Low Risk (10/25/2024)   Alcohol Screen    Last Alcohol Screening Score (AUDIT): 0  Recent Concern: Alcohol Screen - Medium Risk (08/01/2024)   Alcohol Screen    Last Alcohol Screening Score (AUDIT): 10  Housing: Low Risk (10/25/2024)   Epic    Unable to Pay for Housing in the Last Year: No    Number of Times Moved in the Last Year: 0    Homeless in the Last Year: No  Utilities: Not At Risk (10/25/2024)   Epic    Threatened with loss of utilities: No  Health Literacy: Adequate Health Literacy (05/27/2023)   B1300 Health Literacy    Frequency of need for help with medical instructions: Never    Family History  Problem Relation Age of Onset   Dementia Mother 55       died   Alcohol abuse Father 67   Depression Brother    Healthy Son    Colon cancer Neg Hx    Esophageal cancer Neg Hx    Rectal cancer Neg Hx    Stomach cancer Neg Hx     Review of Systems:  As stated in the HPI and otherwise negative.   There were no vitals taken for this visit.  Physical Examination: General: Well  developed, well nourished, NAD  SKIN: warm, dry. Neuro: No focal deficits  Psychiatric: Mood and affect normal  Neck: No JVD Lungs:Clear bilaterally, no wheezes, rhonci, crackles Cardiovascular: Regular rate and rhythm. No murmurs, gallops or rubs. Abdomen:Soft.  Extremities: No lower extremity edema.    EKG:  EKG is *** ordered today. The ekg ordered today demonstrates   Recent Labs: 10/03/2024: BUN 16; Creatinine, Ser 0.89; Hemoglobin 14.7; Platelets 159; Potassium 3.0; Sodium 131   Lipid Panel    Component Value Date/Time   CHOL 102 09/30/2023 0808   TRIG 41 09/30/2023 0808   HDL  84 09/30/2023 0808   CHOLHDL 1.2 09/30/2023 0808   CHOLHDL 3 01/15/2021 0926   VLDL 32.0 01/15/2021 0926   LDLCALC 7 09/30/2023 0808     Wt Readings from Last 3 Encounters:  11/20/24 152 lb (68.9 kg)  10/03/24 148 lb 9.4 oz (67.4 kg)  09/11/24 148 lb 8 oz (67.4 kg)    Assessment and Plan:   1. CAD without angina: No chest pain.  -Continue ASA, Coreg , Crestor  and Repatha    2. Tobacco abuse: Smoking cessation counseling provided.   3. Hyperlipidemia: LDL ***.  -Continue Crestor , Repatha   4. HTN: BP is well controlled.  -Continue Coreg  and Chlorthalidone   5. AAA: Followed in [facility]. Post EVAR  Labs/ tests ordered today include:  No orders of the defined types were placed in this encounter.  Disposition:   FU with me in 12  months  Signed, Lonni Cash, MD 11/21/2024 12:47 PM    [facility] [address] [place], [place], [place]  [address] Phone: [phone]; Fax: [phone]     "

## 2024-11-22 ENCOUNTER — Ambulatory Visit: Admitting: Cardiovascular Disease

## 2024-11-22 ENCOUNTER — Encounter: Payer: Self-pay | Admitting: Cardiovascular Disease

## 2024-11-22 VITALS — BP 100/58 | Resp 16 | Ht 66.0 in | Wt 157.8 lb

## 2024-11-22 DIAGNOSIS — I2510 Atherosclerotic heart disease of native coronary artery without angina pectoris: Secondary | ICD-10-CM

## 2024-11-22 DIAGNOSIS — I10 Essential (primary) hypertension: Secondary | ICD-10-CM

## 2024-11-22 DIAGNOSIS — I7143 Infrarenal abdominal aortic aneurysm, without rupture: Secondary | ICD-10-CM | POA: Diagnosis not present

## 2024-11-22 DIAGNOSIS — E7800 Pure hypercholesterolemia, unspecified: Secondary | ICD-10-CM | POA: Diagnosis not present

## 2024-11-22 DIAGNOSIS — Z720 Tobacco use: Secondary | ICD-10-CM

## 2024-11-22 NOTE — Patient Instructions (Signed)
 Medication Instructions:  Your physician recommends that you continue on your current medications as directed. Please refer to the Current Medication list given to you today.  *If you need a refill on your cardiac medications before your next appointment, please call your pharmacy*  Lab Work: None ordered If you have labs (blood work) drawn today and your tests are completely normal, you will receive your results only by: MyChart Message (if you have MyChart) OR A paper copy in the mail If you have any lab test that is abnormal or we need to change your treatment, we will call you to review the results.  Testing/Procedures: Echocardiogram  Follow-Up: At [facility], you and your health needs are our priority.  As part of our continuing mission to provide you with exceptional heart care, our providers are all part of one team.  This team includes your primary Cardiologist (physician) and Advanced Practice Providers or APPs (Physician Assistants and Nurse Practitioners) who all work together to provide you with the care you need, when you need it.  Your next appointment:   1 year(s)  Provider:   Lonni Cash, MD    We recommend signing up for the patient portal called MyChart.  Sign up information is provided on this After Visit Summary.  MyChart is used to connect with patients for Virtual Visits (Telemedicine).  Patients are able to view lab/test results, encounter notes, upcoming appointments, etc.  Non-urgent messages can be sent to your provider as well.   To learn more about what you can do with MyChart, go to [email or web address].   Other Instructions  ECHOCARDIOGRAM  Your physician has requested that you have an echocardiogram. Echocardiography is a painless test that uses sound waves to create images of your heart. It provides your doctor with information about the size and shape of your heart and how well your hearts chambers and valves are working. This  procedure takes approximately one hour. There are no restrictions for this procedure. Please do NOT wear cologne, perfume, aftershave, or lotions (deodorant is allowed). Please arrive 15 minutes prior to your appointment time.  Please note: We ask at that you not bring children with you during ultrasound (echo/ vascular) testing. Due to room size and safety concerns, children are not allowed in the ultrasound rooms during exams. Our front office staff cannot provide observation of children in our lobby area while testing is being conducted. An adult accompanying a patient to their appointment will only be allowed in the ultrasound room at the discretion of the ultrasound technician under special circumstances. We apologize for any inconvenience.

## 2024-12-06 ENCOUNTER — Encounter: Admitting: Adult Health

## 2024-12-11 ENCOUNTER — Ambulatory Visit: Admitting: Adult Health

## 2025-01-17 ENCOUNTER — Ambulatory Visit: Admitting: Podiatry
